# Patient Record
Sex: Female | Born: 1935 | Race: White | Hispanic: No | Marital: Single | Smoking: Never smoker
Health system: Southern US, Community
[De-identification: ages and names within clinical notes are randomized; demographics above are authoritative.]

## PROBLEM LIST (undated history)

## (undated) DIAGNOSIS — G459 Transient cerebral ischemic attack, unspecified: Secondary | ICD-10-CM

## (undated) DIAGNOSIS — J189 Pneumonia, unspecified organism: Secondary | ICD-10-CM

## (undated) DIAGNOSIS — J9610 Chronic respiratory failure, unspecified whether with hypoxia or hypercapnia: Secondary | ICD-10-CM

## (undated) DIAGNOSIS — K5790 Diverticulosis of intestine, part unspecified, without perforation or abscess without bleeding: Secondary | ICD-10-CM

## (undated) DIAGNOSIS — K219 Gastro-esophageal reflux disease without esophagitis: Secondary | ICD-10-CM

## (undated) DIAGNOSIS — S32599A Other specified fracture of unspecified pubis, initial encounter for closed fracture: Secondary | ICD-10-CM

## (undated) DIAGNOSIS — K449 Diaphragmatic hernia without obstruction or gangrene: Secondary | ICD-10-CM

## (undated) DIAGNOSIS — M810 Age-related osteoporosis without current pathological fracture: Secondary | ICD-10-CM

## (undated) DIAGNOSIS — K5900 Constipation, unspecified: Secondary | ICD-10-CM

## (undated) DIAGNOSIS — D126 Benign neoplasm of colon, unspecified: Secondary | ICD-10-CM

## (undated) DIAGNOSIS — E785 Hyperlipidemia, unspecified: Secondary | ICD-10-CM

## (undated) DIAGNOSIS — R0902 Hypoxemia: Secondary | ICD-10-CM

## (undated) DIAGNOSIS — G9340 Encephalopathy, unspecified: Secondary | ICD-10-CM

## (undated) DIAGNOSIS — I519 Heart disease, unspecified: Secondary | ICD-10-CM

## (undated) DIAGNOSIS — K589 Irritable bowel syndrome without diarrhea: Secondary | ICD-10-CM

## (undated) DIAGNOSIS — K222 Esophageal obstruction: Secondary | ICD-10-CM

## (undated) DIAGNOSIS — J471 Bronchiectasis with (acute) exacerbation: Secondary | ICD-10-CM

## (undated) DIAGNOSIS — D649 Anemia, unspecified: Secondary | ICD-10-CM

## (undated) DIAGNOSIS — E039 Hypothyroidism, unspecified: Secondary | ICD-10-CM

## (undated) DIAGNOSIS — R413 Other amnesia: Secondary | ICD-10-CM

## (undated) DIAGNOSIS — J329 Chronic sinusitis, unspecified: Secondary | ICD-10-CM

## (undated) DIAGNOSIS — I10 Essential (primary) hypertension: Secondary | ICD-10-CM

## (undated) DIAGNOSIS — F3289 Other specified depressive episodes: Secondary | ICD-10-CM

## (undated) DIAGNOSIS — F329 Major depressive disorder, single episode, unspecified: Secondary | ICD-10-CM

## (undated) HISTORY — DX: Benign neoplasm of colon, unspecified: D12.6

## (undated) HISTORY — DX: Constipation, unspecified: K59.00

## (undated) HISTORY — DX: Chronic respiratory failure, unspecified whether with hypoxia or hypercapnia: J96.10

## (undated) HISTORY — DX: Heart disease, unspecified: I51.9

## (undated) HISTORY — DX: Other specified depressive episodes: F32.89

## (undated) HISTORY — DX: Other specified fracture of unspecified pubis, initial encounter for closed fracture: S32.599A

## (undated) HISTORY — DX: Transient cerebral ischemic attack, unspecified: G45.9

## (undated) HISTORY — DX: Age-related osteoporosis without current pathological fracture: M81.0

## (undated) HISTORY — PX: FOOT SURGERY: SHX648

## (undated) HISTORY — DX: Hypothyroidism, unspecified: E03.9

## (undated) HISTORY — DX: Bronchiectasis with (acute) exacerbation: J47.1

## (undated) HISTORY — DX: Irritable bowel syndrome, unspecified: K58.9

## (undated) HISTORY — DX: Chronic sinusitis, unspecified: J32.9

## (undated) HISTORY — DX: Hyperlipidemia, unspecified: E78.5

## (undated) HISTORY — DX: Encephalopathy, unspecified: G93.40

## (undated) HISTORY — DX: Essential (primary) hypertension: I10

## (undated) HISTORY — DX: Hypoxemia: R09.02

## (undated) HISTORY — PX: NASAL SINUS SURGERY: SHX719

## (undated) HISTORY — DX: Diverticulosis of intestine, part unspecified, without perforation or abscess without bleeding: K57.90

## (undated) HISTORY — DX: Other amnesia: R41.3

## (undated) HISTORY — DX: Gastro-esophageal reflux disease without esophagitis: K21.9

## (undated) HISTORY — DX: Diaphragmatic hernia without obstruction or gangrene: K44.9

## (undated) HISTORY — DX: Pneumonia, unspecified organism: J18.9

## (undated) HISTORY — DX: Major depressive disorder, single episode, unspecified: F32.9

## (undated) HISTORY — DX: Esophageal obstruction: K22.2

## (undated) HISTORY — DX: Anemia, unspecified: D64.9

## (undated) HISTORY — PX: TONSILLECTOMY: SUR1361

---

## 1993-09-26 HISTORY — PX: CHOLECYSTECTOMY: SHX55

## 1998-04-08 ENCOUNTER — Other Ambulatory Visit: Admission: RE | Admit: 1998-04-08 | Discharge: 1998-04-08 | Payer: Self-pay | Admitting: Internal Medicine

## 1998-05-19 ENCOUNTER — Other Ambulatory Visit: Admission: RE | Admit: 1998-05-19 | Discharge: 1998-05-19 | Payer: Self-pay | Admitting: Obstetrics and Gynecology

## 1998-08-31 ENCOUNTER — Ambulatory Visit (HOSPITAL_COMMUNITY): Admission: RE | Admit: 1998-08-31 | Discharge: 1998-08-31 | Payer: Self-pay | Admitting: Obstetrics & Gynecology

## 1998-09-26 ENCOUNTER — Inpatient Hospital Stay (HOSPITAL_COMMUNITY): Admission: EM | Admit: 1998-09-26 | Discharge: 1998-10-01 | Payer: Self-pay | Admitting: Emergency Medicine

## 1998-09-26 ENCOUNTER — Encounter: Payer: Self-pay | Admitting: Emergency Medicine

## 1999-07-12 ENCOUNTER — Other Ambulatory Visit: Admission: RE | Admit: 1999-07-12 | Discharge: 1999-07-12 | Payer: Self-pay | Admitting: Obstetrics and Gynecology

## 2000-06-29 ENCOUNTER — Other Ambulatory Visit: Admission: RE | Admit: 2000-06-29 | Discharge: 2000-06-29 | Payer: Self-pay | Admitting: Obstetrics and Gynecology

## 2001-07-02 ENCOUNTER — Other Ambulatory Visit: Admission: RE | Admit: 2001-07-02 | Discharge: 2001-07-02 | Payer: Self-pay | Admitting: Obstetrics and Gynecology

## 2001-09-10 ENCOUNTER — Other Ambulatory Visit: Admission: RE | Admit: 2001-09-10 | Discharge: 2001-09-10 | Payer: Self-pay | Admitting: Obstetrics and Gynecology

## 2001-10-16 ENCOUNTER — Encounter: Payer: Self-pay | Admitting: Gastroenterology

## 2002-01-31 ENCOUNTER — Inpatient Hospital Stay (HOSPITAL_COMMUNITY): Admission: EM | Admit: 2002-01-31 | Discharge: 2002-02-06 | Payer: Self-pay | Admitting: Internal Medicine

## 2002-04-22 ENCOUNTER — Inpatient Hospital Stay (HOSPITAL_COMMUNITY): Admission: EM | Admit: 2002-04-22 | Discharge: 2002-04-25 | Payer: Self-pay | Admitting: Internal Medicine

## 2002-08-13 ENCOUNTER — Other Ambulatory Visit: Admission: RE | Admit: 2002-08-13 | Discharge: 2002-08-13 | Payer: Self-pay | Admitting: Obstetrics and Gynecology

## 2002-12-22 ENCOUNTER — Inpatient Hospital Stay (HOSPITAL_COMMUNITY): Admission: EM | Admit: 2002-12-22 | Discharge: 2002-12-26 | Payer: Self-pay | Admitting: Emergency Medicine

## 2002-12-22 ENCOUNTER — Encounter: Payer: Self-pay | Admitting: *Deleted

## 2002-12-23 ENCOUNTER — Encounter: Payer: Self-pay | Admitting: Cardiology

## 2003-08-29 ENCOUNTER — Encounter: Payer: Self-pay | Admitting: Gastroenterology

## 2003-12-02 ENCOUNTER — Encounter: Admission: RE | Admit: 2003-12-02 | Discharge: 2003-12-02 | Payer: Self-pay | Admitting: Otolaryngology

## 2003-12-04 ENCOUNTER — Encounter (INDEPENDENT_AMBULATORY_CARE_PROVIDER_SITE_OTHER): Payer: Self-pay | Admitting: *Deleted

## 2003-12-04 ENCOUNTER — Ambulatory Visit (HOSPITAL_BASED_OUTPATIENT_CLINIC_OR_DEPARTMENT_OTHER): Admission: RE | Admit: 2003-12-04 | Discharge: 2003-12-04 | Payer: Self-pay | Admitting: Otolaryngology

## 2003-12-04 ENCOUNTER — Ambulatory Visit (HOSPITAL_COMMUNITY): Admission: RE | Admit: 2003-12-04 | Discharge: 2003-12-04 | Payer: Self-pay | Admitting: Otolaryngology

## 2004-08-27 ENCOUNTER — Ambulatory Visit: Payer: Self-pay | Admitting: Internal Medicine

## 2004-10-11 ENCOUNTER — Ambulatory Visit: Payer: Self-pay | Admitting: Internal Medicine

## 2004-11-22 ENCOUNTER — Ambulatory Visit: Payer: Self-pay | Admitting: Internal Medicine

## 2004-12-09 ENCOUNTER — Ambulatory Visit: Payer: Self-pay | Admitting: Internal Medicine

## 2005-01-20 ENCOUNTER — Ambulatory Visit: Payer: Self-pay | Admitting: Internal Medicine

## 2005-04-20 ENCOUNTER — Ambulatory Visit: Payer: Self-pay | Admitting: Internal Medicine

## 2005-05-18 ENCOUNTER — Ambulatory Visit: Payer: Self-pay | Admitting: Internal Medicine

## 2005-06-03 ENCOUNTER — Ambulatory Visit: Payer: Self-pay | Admitting: Pulmonary Disease

## 2005-06-09 ENCOUNTER — Ambulatory Visit: Payer: Self-pay | Admitting: Family Medicine

## 2005-06-17 ENCOUNTER — Ambulatory Visit: Payer: Self-pay | Admitting: Internal Medicine

## 2005-07-04 ENCOUNTER — Ambulatory Visit: Payer: Self-pay | Admitting: Pulmonary Disease

## 2005-07-29 ENCOUNTER — Ambulatory Visit: Payer: Self-pay | Admitting: Internal Medicine

## 2005-09-09 ENCOUNTER — Ambulatory Visit: Payer: Self-pay | Admitting: Internal Medicine

## 2005-10-25 ENCOUNTER — Ambulatory Visit: Payer: Self-pay | Admitting: Internal Medicine

## 2005-10-25 ENCOUNTER — Ambulatory Visit: Payer: Self-pay | Admitting: Gastroenterology

## 2005-12-02 ENCOUNTER — Ambulatory Visit: Payer: Self-pay | Admitting: Internal Medicine

## 2005-12-16 ENCOUNTER — Ambulatory Visit: Payer: Self-pay | Admitting: Internal Medicine

## 2006-02-16 ENCOUNTER — Ambulatory Visit: Payer: Self-pay | Admitting: Internal Medicine

## 2006-05-17 ENCOUNTER — Ambulatory Visit: Payer: Self-pay | Admitting: Internal Medicine

## 2006-07-14 ENCOUNTER — Ambulatory Visit: Payer: Self-pay | Admitting: Internal Medicine

## 2006-07-21 ENCOUNTER — Inpatient Hospital Stay (HOSPITAL_COMMUNITY): Admission: EM | Admit: 2006-07-21 | Discharge: 2006-07-27 | Payer: Self-pay | Admitting: Internal Medicine

## 2006-07-21 ENCOUNTER — Ambulatory Visit: Payer: Self-pay | Admitting: Internal Medicine

## 2006-08-04 ENCOUNTER — Ambulatory Visit: Payer: Self-pay | Admitting: Internal Medicine

## 2006-08-10 ENCOUNTER — Ambulatory Visit: Payer: Self-pay | Admitting: Emergency Medicine

## 2006-09-15 ENCOUNTER — Ambulatory Visit: Payer: Self-pay | Admitting: Internal Medicine

## 2006-11-08 ENCOUNTER — Ambulatory Visit: Payer: Self-pay | Admitting: Internal Medicine

## 2006-11-08 LAB — CONVERTED CEMR LAB: TSH: 0.5 microintl units/mL (ref 0.35–5.50)

## 2006-11-14 ENCOUNTER — Inpatient Hospital Stay (HOSPITAL_COMMUNITY): Admission: EM | Admit: 2006-11-14 | Discharge: 2006-11-19 | Payer: Self-pay | Admitting: Internal Medicine

## 2006-11-14 ENCOUNTER — Ambulatory Visit: Payer: Self-pay | Admitting: Internal Medicine

## 2006-11-27 ENCOUNTER — Ambulatory Visit: Payer: Self-pay | Admitting: Internal Medicine

## 2006-12-26 ENCOUNTER — Ambulatory Visit: Payer: Self-pay | Admitting: Internal Medicine

## 2006-12-26 LAB — CONVERTED CEMR LAB
AST: 34 units/L (ref 0–37)
Albumin: 3.4 g/dL — ABNORMAL LOW (ref 3.5–5.2)
Alkaline Phosphatase: 69 units/L (ref 39–117)
BUN: 12 mg/dL (ref 6–23)
Basophils Absolute: 0.1 10*3/uL (ref 0.0–0.1)
Chloride: 100 meq/L (ref 96–112)
Cholesterol: 223 mg/dL (ref 0–200)
Creatinine, Ser: 0.8 mg/dL (ref 0.4–1.2)
Direct LDL: 140.3 mg/dL
Eosinophils Absolute: 0.2 10*3/uL (ref 0.0–0.6)
GFR calc non Af Amer: 75 mL/min
HDL: 66.8 mg/dL (ref >39.0)
MCHC: 35.2 g/dL (ref 30.0–36.0)
MCV: 88.1 fL (ref 78.0–100.0)
Monocytes Relative: 12.3 % — ABNORMAL HIGH (ref 3.0–11.0)
Potassium: 3.6 meq/L (ref 3.5–5.1)
RBC: 4.5 M/uL (ref 3.87–5.11)
RDW: 13.7 % (ref 11.5–14.6)
TSH: 1.58 microintl units/mL (ref 0.35–5.50)
Total Bilirubin: 0.8 mg/dL (ref 0.3–1.2)
Total CHOL/HDL Ratio: 3.3
Triglycerides: 116 mg/dL (ref 0–149)

## 2007-01-08 ENCOUNTER — Ambulatory Visit: Payer: Self-pay | Admitting: Internal Medicine

## 2007-01-08 ENCOUNTER — Encounter: Payer: Self-pay | Admitting: Pulmonary Disease

## 2007-02-26 ENCOUNTER — Ambulatory Visit: Payer: Self-pay | Admitting: Internal Medicine

## 2007-02-26 LAB — CONVERTED CEMR LAB
Cholesterol: 191 mg/dL (ref 0–200)
HDL: 71.2 mg/dL (ref >39.0)
Triglycerides: 106 mg/dL (ref 0–149)
VLDL: 21 mg/dL (ref 0–40)

## 2007-04-09 ENCOUNTER — Ambulatory Visit: Payer: Self-pay | Admitting: Internal Medicine

## 2007-04-10 ENCOUNTER — Ambulatory Visit: Payer: Self-pay | Admitting: Pulmonary Disease

## 2007-07-04 ENCOUNTER — Ambulatory Visit: Payer: Self-pay | Admitting: Family Medicine

## 2007-07-10 ENCOUNTER — Ambulatory Visit: Payer: Self-pay | Admitting: Internal Medicine

## 2007-07-12 DIAGNOSIS — E785 Hyperlipidemia, unspecified: Secondary | ICD-10-CM | POA: Insufficient documentation

## 2007-07-12 DIAGNOSIS — M810 Age-related osteoporosis without current pathological fracture: Secondary | ICD-10-CM | POA: Insufficient documentation

## 2007-07-12 DIAGNOSIS — J40 Bronchitis, not specified as acute or chronic: Secondary | ICD-10-CM | POA: Insufficient documentation

## 2007-08-06 ENCOUNTER — Ambulatory Visit: Payer: Self-pay | Admitting: Internal Medicine

## 2007-08-13 ENCOUNTER — Ambulatory Visit: Payer: Self-pay | Admitting: Pulmonary Disease

## 2007-08-14 ENCOUNTER — Encounter: Payer: Self-pay | Admitting: Pulmonary Disease

## 2007-08-28 ENCOUNTER — Ambulatory Visit: Payer: Self-pay | Admitting: Internal Medicine

## 2007-08-29 ENCOUNTER — Ambulatory Visit: Payer: Self-pay | Admitting: Internal Medicine

## 2007-08-29 ENCOUNTER — Telehealth (INDEPENDENT_AMBULATORY_CARE_PROVIDER_SITE_OTHER): Payer: Self-pay | Admitting: *Deleted

## 2007-08-30 ENCOUNTER — Ambulatory Visit: Payer: Self-pay | Admitting: Pulmonary Disease

## 2007-08-30 LAB — CONVERTED CEMR LAB
AST: 26 units/L (ref 0–37)
Albumin: 3.4 g/dL — ABNORMAL LOW (ref 3.5–5.2)
Alkaline Phosphatase: 63 units/L (ref 39–117)
BUN: 7 mg/dL (ref 6–23)
CO2: 30 meq/L (ref 19–32)
Calcium: 8.9 mg/dL (ref 8.4–10.5)
GFR calc Af Amer: 91 mL/min
GFR calc non Af Amer: 75 mL/min
Magnesium: 2.2 mg/dL (ref 1.5–2.5)
Potassium: 3.8 meq/L (ref 3.5–5.1)
Total Bilirubin: 0.5 mg/dL (ref 0.3–1.2)
Total Protein: 6.9 g/dL (ref 6.0–8.3)

## 2007-09-14 ENCOUNTER — Ambulatory Visit: Payer: Self-pay | Admitting: Internal Medicine

## 2007-09-14 DIAGNOSIS — M8900 Algoneurodystrophy, unspecified site: Secondary | ICD-10-CM | POA: Insufficient documentation

## 2007-10-12 ENCOUNTER — Ambulatory Visit (HOSPITAL_COMMUNITY): Admission: RE | Admit: 2007-10-12 | Discharge: 2007-10-12 | Payer: Self-pay | Admitting: Pulmonary Disease

## 2007-10-12 DIAGNOSIS — J4489 Other specified chronic obstructive pulmonary disease: Secondary | ICD-10-CM | POA: Insufficient documentation

## 2007-10-12 DIAGNOSIS — J449 Chronic obstructive pulmonary disease, unspecified: Secondary | ICD-10-CM

## 2007-10-15 ENCOUNTER — Ambulatory Visit: Payer: Self-pay | Admitting: Internal Medicine

## 2007-10-15 DIAGNOSIS — J471 Bronchiectasis with (acute) exacerbation: Secondary | ICD-10-CM

## 2008-01-07 ENCOUNTER — Ambulatory Visit: Payer: Self-pay | Admitting: Internal Medicine

## 2008-01-07 DIAGNOSIS — E039 Hypothyroidism, unspecified: Secondary | ICD-10-CM | POA: Insufficient documentation

## 2008-01-08 LAB — CONVERTED CEMR LAB
ALT: 15 units/L (ref 0–35)
AST: 22 units/L (ref 0–37)
Basophils Absolute: 0.1 10*3/uL (ref 0.0–0.1)
Basophils Relative: 1.2 % — ABNORMAL HIGH (ref 0.0–1.0)
Bilirubin, Direct: 0.1 mg/dL (ref 0.0–0.3)
CO2: 33 meq/L — ABNORMAL HIGH (ref 19–32)
Calcium: 9.1 mg/dL (ref 8.4–10.5)
Chloride: 96 meq/L (ref 96–112)
Creatinine, Ser: 0.8 mg/dL (ref 0.4–1.2)
Glucose, Bld: 91 mg/dL (ref 70–99)
Hemoglobin: 15 g/dL (ref 12.0–15.0)
Lymphocytes Relative: 28.9 % (ref 12.0–46.0)
Monocytes Relative: 13.1 % — ABNORMAL HIGH (ref 3.0–12.0)
Neutro Abs: 4.3 10*3/uL (ref 1.4–7.7)
Neutrophils Relative %: 55.3 % (ref 43.0–77.0)
RBC: 5.09 M/uL (ref 3.87–5.11)
TSH: 11.3 microintl units/mL — ABNORMAL HIGH (ref 0.35–5.50)
Total Bilirubin: 0.9 mg/dL (ref 0.3–1.2)
Total Protein: 7.1 g/dL (ref 6.0–8.3)
Vit D, 1,25-Dihydroxy: 32 (ref 30–89)
WBC: 7.8 10*3/uL (ref 4.5–10.5)

## 2008-03-06 ENCOUNTER — Encounter: Payer: Self-pay | Admitting: Internal Medicine

## 2008-03-13 ENCOUNTER — Ambulatory Visit: Payer: Self-pay | Admitting: Internal Medicine

## 2008-03-14 DIAGNOSIS — K219 Gastro-esophageal reflux disease without esophagitis: Secondary | ICD-10-CM | POA: Insufficient documentation

## 2008-04-21 ENCOUNTER — Ambulatory Visit: Payer: Self-pay | Admitting: Internal Medicine

## 2008-04-30 ENCOUNTER — Encounter: Payer: Self-pay | Admitting: Internal Medicine

## 2008-05-27 HISTORY — PX: SMALL INTESTINE SURGERY: SHX150

## 2008-06-09 ENCOUNTER — Ambulatory Visit: Payer: Self-pay | Admitting: Internal Medicine

## 2008-06-12 ENCOUNTER — Inpatient Hospital Stay (HOSPITAL_COMMUNITY): Admission: EM | Admit: 2008-06-12 | Discharge: 2008-06-20 | Payer: Self-pay | Admitting: Emergency Medicine

## 2008-06-12 ENCOUNTER — Ambulatory Visit: Payer: Self-pay | Admitting: Internal Medicine

## 2008-06-13 ENCOUNTER — Encounter (INDEPENDENT_AMBULATORY_CARE_PROVIDER_SITE_OTHER): Payer: Self-pay | Admitting: *Deleted

## 2008-06-13 ENCOUNTER — Encounter: Payer: Self-pay | Admitting: Internal Medicine

## 2008-07-02 ENCOUNTER — Ambulatory Visit: Payer: Self-pay | Admitting: Internal Medicine

## 2008-07-02 ENCOUNTER — Inpatient Hospital Stay (HOSPITAL_COMMUNITY): Admission: EM | Admit: 2008-07-02 | Discharge: 2008-07-04 | Payer: Self-pay | Admitting: Emergency Medicine

## 2008-07-21 ENCOUNTER — Ambulatory Visit: Payer: Self-pay | Admitting: Internal Medicine

## 2008-07-21 DIAGNOSIS — Z8719 Personal history of other diseases of the digestive system: Secondary | ICD-10-CM

## 2008-08-01 ENCOUNTER — Encounter: Payer: Self-pay | Admitting: Internal Medicine

## 2008-08-11 ENCOUNTER — Ambulatory Visit: Payer: Self-pay | Admitting: Internal Medicine

## 2008-09-23 ENCOUNTER — Ambulatory Visit: Payer: Self-pay | Admitting: Internal Medicine

## 2008-10-01 ENCOUNTER — Telehealth: Payer: Self-pay | Admitting: Adult Health

## 2008-10-09 ENCOUNTER — Ambulatory Visit: Payer: Self-pay | Admitting: Adult Health

## 2008-10-09 LAB — CONVERTED CEMR LAB
BUN: 8 mg/dL (ref 6–23)
CO2: 29 meq/L (ref 19–32)
Calcium: 9.2 mg/dL (ref 8.4–10.5)
Chloride: 99 meq/L (ref 96–112)
Creatinine, Ser: 1 mg/dL (ref 0.4–1.2)

## 2008-10-24 ENCOUNTER — Ambulatory Visit (HOSPITAL_COMMUNITY): Admission: RE | Admit: 2008-10-24 | Discharge: 2008-10-24 | Payer: Self-pay | Admitting: Internal Medicine

## 2008-10-30 ENCOUNTER — Ambulatory Visit: Payer: Self-pay | Admitting: Internal Medicine

## 2008-10-30 DIAGNOSIS — J479 Bronchiectasis, uncomplicated: Secondary | ICD-10-CM

## 2008-11-05 ENCOUNTER — Telehealth (INDEPENDENT_AMBULATORY_CARE_PROVIDER_SITE_OTHER): Payer: Self-pay | Admitting: *Deleted

## 2008-12-04 ENCOUNTER — Ambulatory Visit: Payer: Self-pay | Admitting: Gastroenterology

## 2008-12-04 DIAGNOSIS — R143 Flatulence: Secondary | ICD-10-CM

## 2008-12-04 DIAGNOSIS — R141 Gas pain: Secondary | ICD-10-CM

## 2008-12-04 DIAGNOSIS — R142 Eructation: Secondary | ICD-10-CM

## 2008-12-04 DIAGNOSIS — K5730 Diverticulosis of large intestine without perforation or abscess without bleeding: Secondary | ICD-10-CM | POA: Insufficient documentation

## 2008-12-11 ENCOUNTER — Ambulatory Visit: Payer: Self-pay | Admitting: Internal Medicine

## 2008-12-24 ENCOUNTER — Ambulatory Visit: Payer: Self-pay | Admitting: Internal Medicine

## 2008-12-25 DIAGNOSIS — D126 Benign neoplasm of colon, unspecified: Secondary | ICD-10-CM

## 2008-12-25 HISTORY — DX: Benign neoplasm of colon, unspecified: D12.6

## 2009-01-01 ENCOUNTER — Ambulatory Visit: Payer: Self-pay | Admitting: Gastroenterology

## 2009-01-01 ENCOUNTER — Encounter: Payer: Self-pay | Admitting: Gastroenterology

## 2009-01-02 ENCOUNTER — Encounter: Payer: Self-pay | Admitting: Gastroenterology

## 2009-01-07 ENCOUNTER — Ambulatory Visit: Payer: Self-pay | Admitting: Internal Medicine

## 2009-01-07 DIAGNOSIS — G459 Transient cerebral ischemic attack, unspecified: Secondary | ICD-10-CM | POA: Insufficient documentation

## 2009-01-08 ENCOUNTER — Telehealth (INDEPENDENT_AMBULATORY_CARE_PROVIDER_SITE_OTHER): Payer: Self-pay | Admitting: *Deleted

## 2009-01-08 LAB — CONVERTED CEMR LAB: Vit D, 25-Hydroxy: 34 ng/mL (ref 30–89)

## 2009-01-09 LAB — CONVERTED CEMR LAB
AST: 19 units/L (ref 0–37)
Alkaline Phosphatase: 58 units/L (ref 39–117)
Basophils Relative: 0.9 % (ref 0.0–3.0)
Bilirubin, Direct: 0.1 mg/dL (ref 0.0–0.3)
Calcium: 9.5 mg/dL (ref 8.4–10.5)
Creatinine, Ser: 0.9 mg/dL (ref 0.4–1.2)
Eosinophils Absolute: 0.3 10*3/uL (ref 0.0–0.7)
Eosinophils Relative: 3.3 % (ref 0.0–5.0)
GFR calc non Af Amer: 65.16 mL/min (ref >60)
HDL: 62 mg/dL (ref >39.00)
Hemoglobin, Urine: NEGATIVE
Hemoglobin: 12.5 g/dL (ref 12.0–15.0)
Ketones, ur: NEGATIVE mg/dL
LDL Cholesterol: 88 mg/dL (ref 0–99)
Lymphocytes Relative: 23.5 % (ref 12.0–46.0)
Monocytes Relative: 14.8 % — ABNORMAL HIGH (ref 3.0–12.0)
Neutro Abs: 5 10*3/uL (ref 1.4–7.7)
Neutrophils Relative %: 57.5 % (ref 43.0–77.0)
RBC: 4.35 M/uL (ref 3.87–5.11)
Sodium: 142 meq/L (ref 135–145)
Total CHOL/HDL Ratio: 3
Total Protein, Urine: NEGATIVE mg/dL
Total Protein: 7 g/dL (ref 6.0–8.3)
Triglycerides: 65 mg/dL (ref 0.0–149.0)
Urine Glucose: NEGATIVE mg/dL
WBC: 8.8 10*3/uL (ref 4.5–10.5)

## 2009-01-26 ENCOUNTER — Encounter: Payer: Self-pay | Admitting: Internal Medicine

## 2009-03-13 ENCOUNTER — Encounter: Payer: Self-pay | Admitting: Internal Medicine

## 2009-03-17 ENCOUNTER — Ambulatory Visit: Payer: Self-pay | Admitting: Internal Medicine

## 2009-05-13 ENCOUNTER — Encounter: Payer: Self-pay | Admitting: Internal Medicine

## 2009-05-19 ENCOUNTER — Ambulatory Visit: Payer: Self-pay | Admitting: Internal Medicine

## 2009-06-05 ENCOUNTER — Telehealth (INDEPENDENT_AMBULATORY_CARE_PROVIDER_SITE_OTHER): Payer: Self-pay | Admitting: *Deleted

## 2009-06-30 ENCOUNTER — Encounter: Payer: Self-pay | Admitting: Internal Medicine

## 2009-06-30 ENCOUNTER — Encounter: Payer: Self-pay | Admitting: Adult Health

## 2009-07-07 ENCOUNTER — Encounter: Payer: Self-pay | Admitting: Internal Medicine

## 2009-07-22 ENCOUNTER — Ambulatory Visit: Payer: Self-pay | Admitting: Internal Medicine

## 2009-07-22 DIAGNOSIS — M2550 Pain in unspecified joint: Secondary | ICD-10-CM | POA: Insufficient documentation

## 2009-08-05 ENCOUNTER — Ambulatory Visit: Payer: Self-pay | Admitting: Internal Medicine

## 2009-08-05 ENCOUNTER — Encounter: Payer: Self-pay | Admitting: Internal Medicine

## 2009-08-24 ENCOUNTER — Ambulatory Visit: Payer: Self-pay | Admitting: Internal Medicine

## 2009-09-21 IMAGING — CR DG CHEST 2V
2 series · 2 of 2 positions shown · non-contrast
Comparison: 08/13/2007

CLINICAL DATA: Bronchiectasis, acute exacerbation

CHEST - 2 VIEW

[view not recorded (1 of 2)]
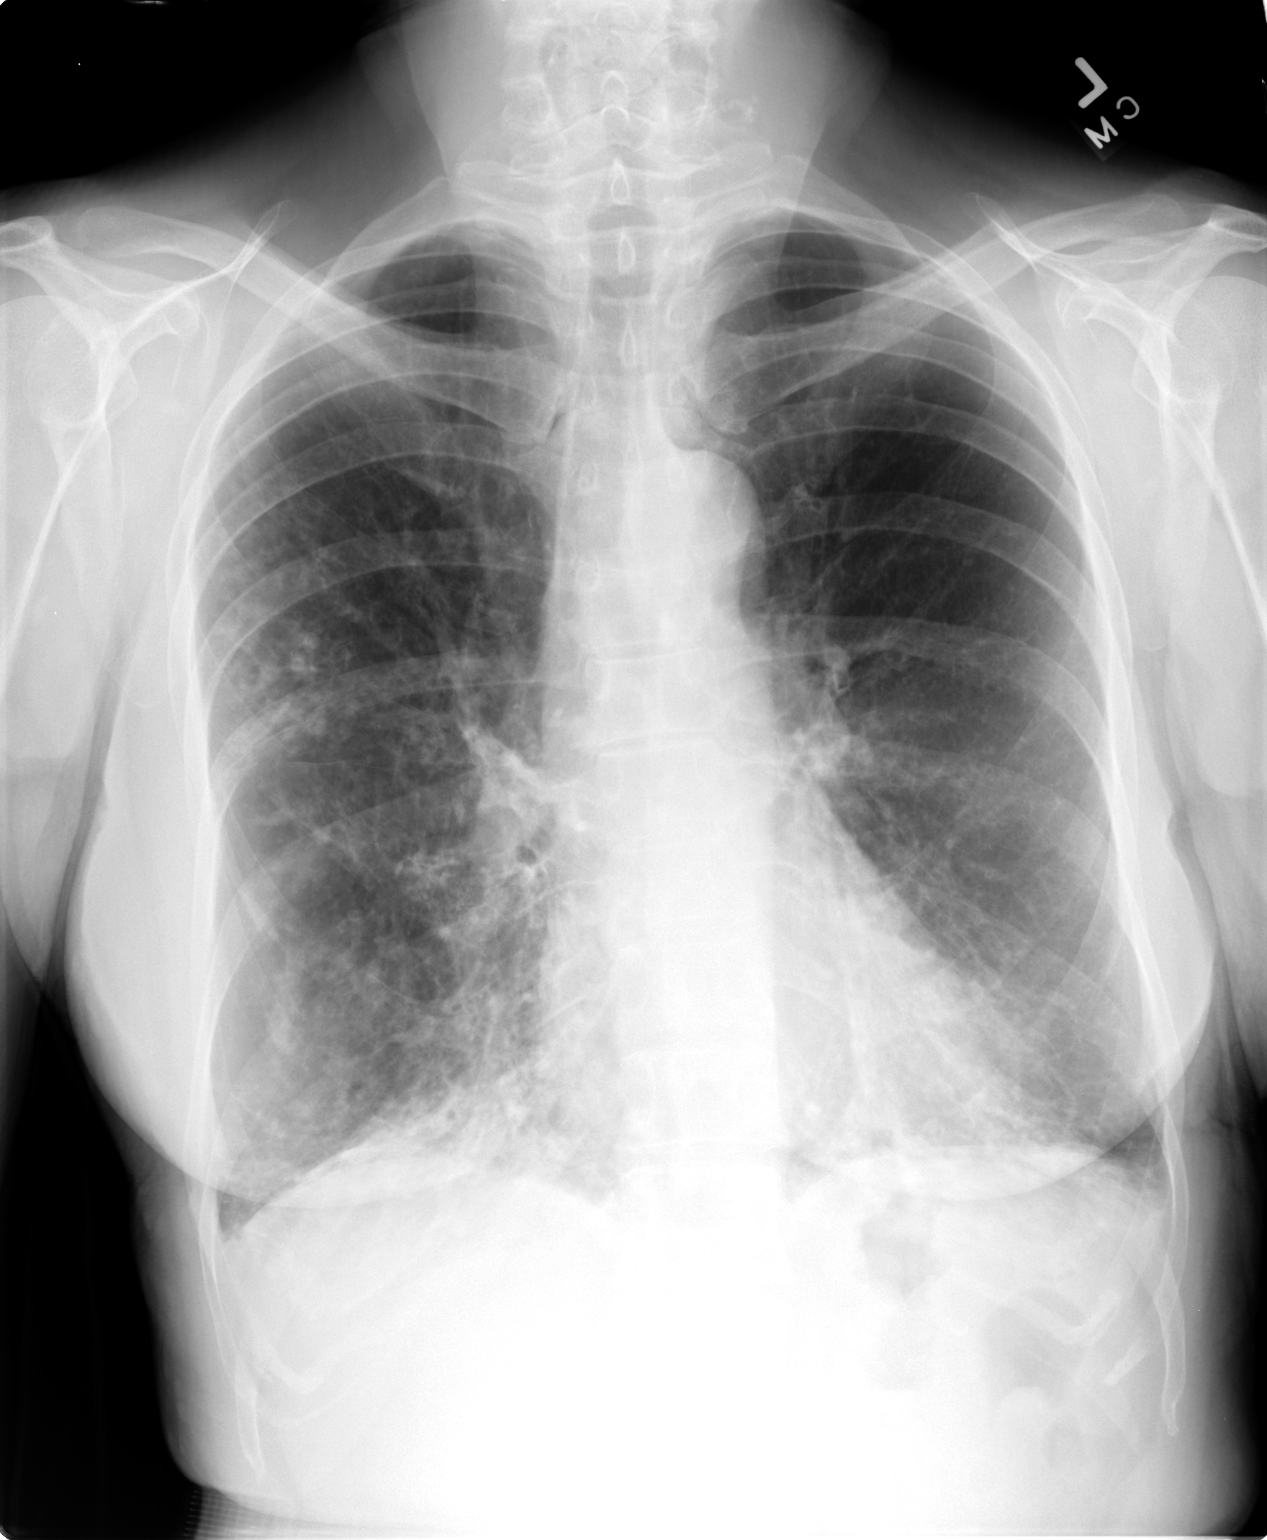

[view not recorded (2 of 2)]
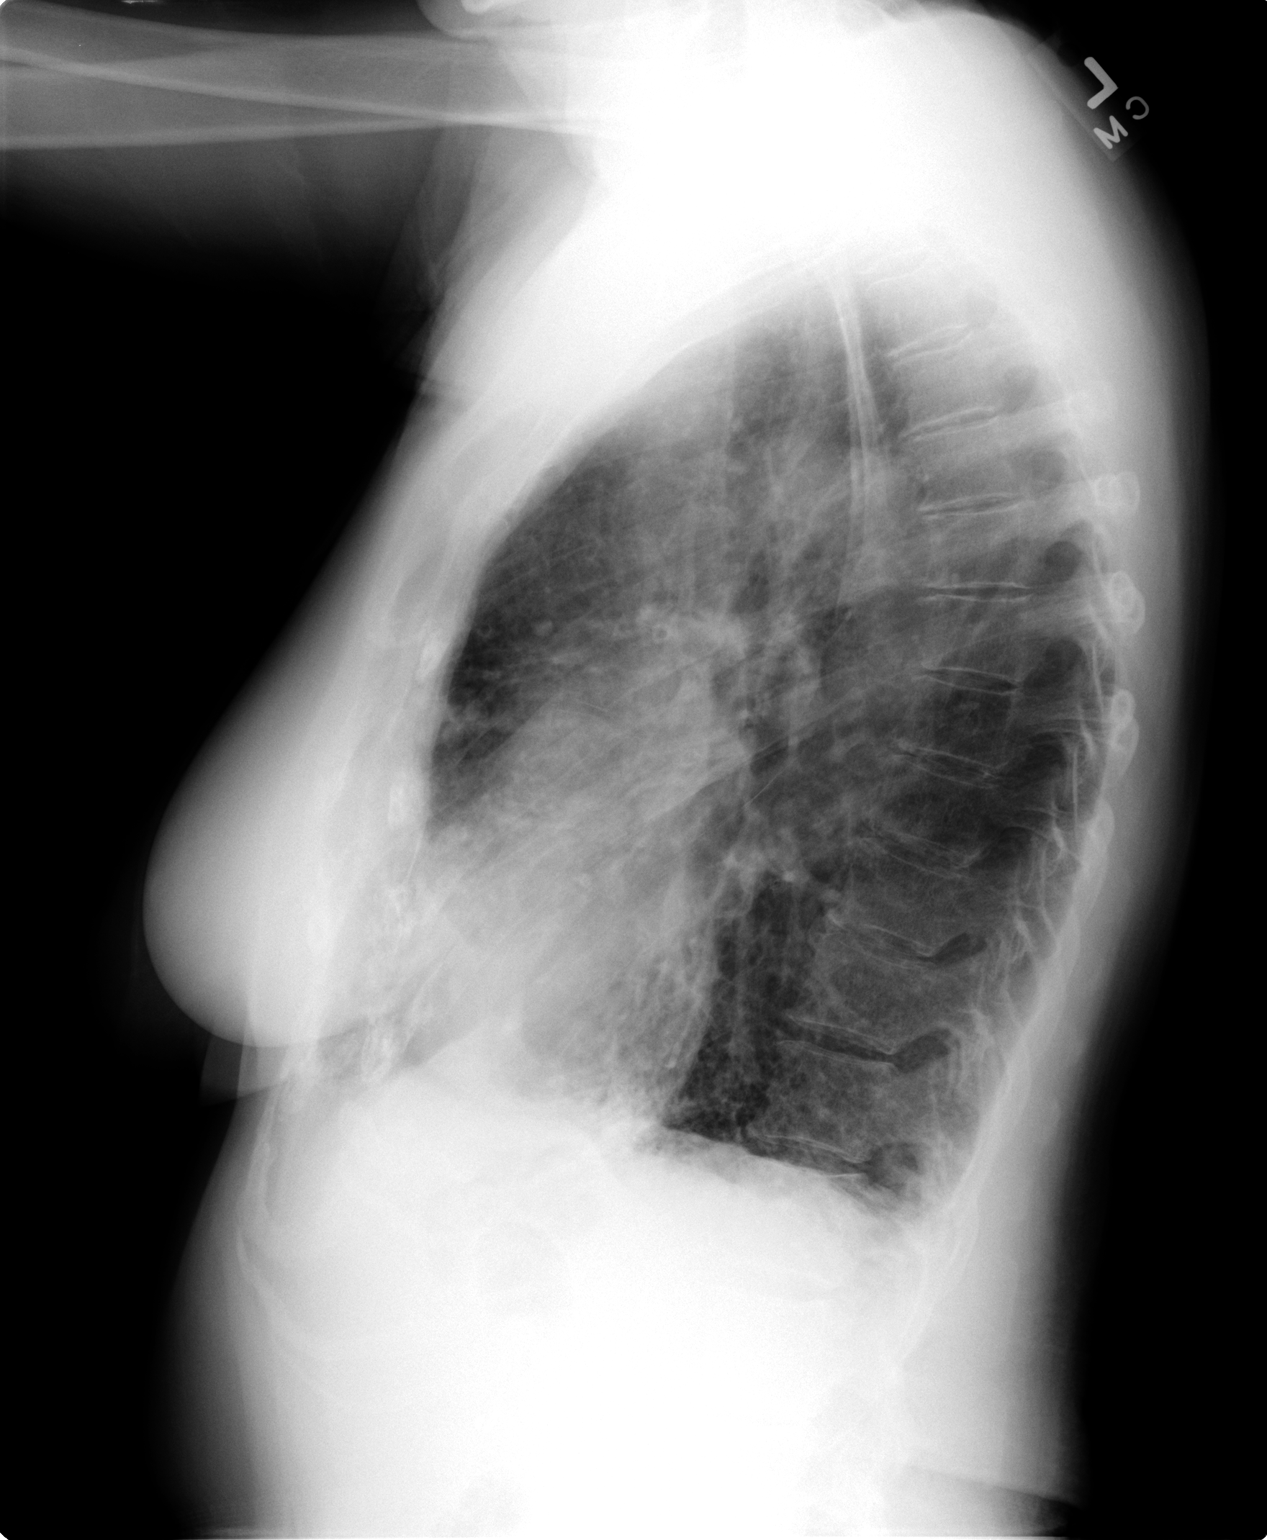

[2 of 2 positions shown; findings below may reference images not displayed]

FINDINGS: Scarring at the lung bases and nodularity in the
periphery the right mid lung appears stable.  No active process is
seen.  No effusion is noted.  The heart is within normal limits in
size.
IMPRESSION: Stable chronic change with basilar scarring and nodularity on the
right.  No active process.

## 2009-09-24 ENCOUNTER — Telehealth: Payer: Self-pay | Admitting: Internal Medicine

## 2009-09-24 ENCOUNTER — Encounter: Payer: Self-pay | Admitting: Internal Medicine

## 2009-10-13 ENCOUNTER — Ambulatory Visit: Payer: Self-pay | Admitting: Internal Medicine

## 2009-10-26 ENCOUNTER — Ambulatory Visit: Payer: Self-pay | Admitting: Internal Medicine

## 2009-10-26 ENCOUNTER — Encounter: Payer: Self-pay | Admitting: Adult Health

## 2009-10-26 DIAGNOSIS — R5383 Other fatigue: Secondary | ICD-10-CM

## 2009-10-26 DIAGNOSIS — R5381 Other malaise: Secondary | ICD-10-CM

## 2009-10-26 LAB — CONVERTED CEMR LAB
Basophils Relative: 0.2 % (ref 0.0–3.0)
CO2: 31 meq/L (ref 19–32)
Chloride: 98 meq/L (ref 96–112)
Creatinine, Ser: 0.9 mg/dL (ref 0.4–1.2)
Eosinophils Absolute: 0.1 10*3/uL (ref 0.0–0.7)
Glucose, Bld: 89 mg/dL (ref 70–99)
Hemoglobin: 12.4 g/dL (ref 12.0–15.0)
MCHC: 33.1 g/dL (ref 30.0–36.0)
MCV: 89.9 fL (ref 78.0–100.0)
Monocytes Absolute: 1.2 10*3/uL — ABNORMAL HIGH (ref 0.1–1.0)
Neutro Abs: 18.6 10*3/uL — ABNORMAL HIGH (ref 1.4–7.7)
RBC: 4.17 M/uL (ref 3.87–5.11)
Saturation Ratios: 15.9 % — ABNORMAL LOW (ref 20.0–50.0)
Sodium: 135 meq/L (ref 135–145)
Specific Gravity, Urine: <=1.005 (ref 1.000–1.030)
pH: 7 (ref 5.0–8.0)

## 2009-11-05 ENCOUNTER — Ambulatory Visit (HOSPITAL_COMMUNITY): Admission: RE | Admit: 2009-11-05 | Discharge: 2009-11-05 | Payer: Self-pay | Admitting: Internal Medicine

## 2009-11-19 ENCOUNTER — Ambulatory Visit: Payer: Self-pay | Admitting: Internal Medicine

## 2010-01-11 ENCOUNTER — Ambulatory Visit: Payer: Self-pay | Admitting: Internal Medicine

## 2010-01-12 LAB — CONVERTED CEMR LAB
Alkaline Phosphatase: 41 units/L (ref 39–117)
Basophils Absolute: 0.1 10*3/uL (ref 0.0–0.1)
Bilirubin, Direct: 0.1 mg/dL (ref 0.0–0.3)
Calcium: 8.9 mg/dL (ref 8.4–10.5)
Cholesterol: 172 mg/dL (ref 0–200)
Creatinine, Ser: 0.9 mg/dL (ref 0.4–1.2)
Eosinophils Absolute: 0.2 10*3/uL (ref 0.0–0.7)
HCT: 40.2 % (ref 36.0–46.0)
HDL: 77.4 mg/dL (ref >39.00)
Hemoglobin: 13.8 g/dL (ref 12.0–15.0)
LDL Cholesterol: 78 mg/dL (ref 0–99)
Lymphocytes Relative: 27.3 % (ref 12.0–46.0)
Lymphs Abs: 2.5 10*3/uL (ref 0.7–4.0)
MCHC: 34.3 g/dL (ref 30.0–36.0)
Monocytes Relative: 12.1 % — ABNORMAL HIGH (ref 3.0–12.0)
Neutro Abs: 5.3 10*3/uL (ref 1.4–7.7)
Nitrite: NEGATIVE
Platelets: 400 10*3/uL (ref 150.0–400.0)
RDW: 14.6 % (ref 11.5–14.6)
Sodium: 140 meq/L (ref 135–145)
Specific Gravity, Urine: 1.01 (ref 1.000–1.030)
Total Bilirubin: 0.4 mg/dL (ref 0.3–1.2)
Total CHOL/HDL Ratio: 2
Total Protein, Urine: NEGATIVE mg/dL
Triglycerides: 82 mg/dL (ref 0.0–149.0)
Urine Glucose: NEGATIVE mg/dL
Vit D, 25-Hydroxy: 35 ng/mL (ref 30–89)
pH: 6.5 (ref 5.0–8.0)

## 2010-02-17 ENCOUNTER — Encounter: Payer: Self-pay | Admitting: Internal Medicine

## 2010-02-26 IMAGING — CT CT ABDOMEN W/O CM
1 of 2 series · 15 of 32 positions shown, 19 images · non-contrast
Comparison: None

CT ABDOMEN

CLINICAL DATA: Abdominal pain

CT ABDOMEN AND PELVIS WITHOUT CONTRAST
TECHNIQUE: Multidetector CT imaging of the abdomen and pelvis was
performed following the standard
protocol without intravenous contrast.

[Series 2: abd_pel 5.0 b40f st · axial · 0.66mm/px · z∈[-467,-32]mm · 15 of 95 slices shown, 19 images]
[im 4/95  soft-tissue]
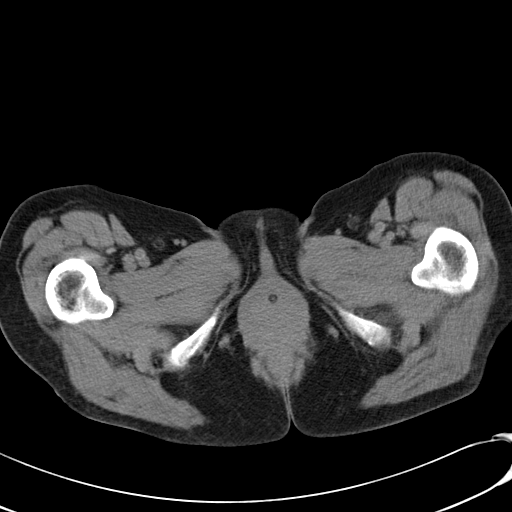
[im 4/95  bone]
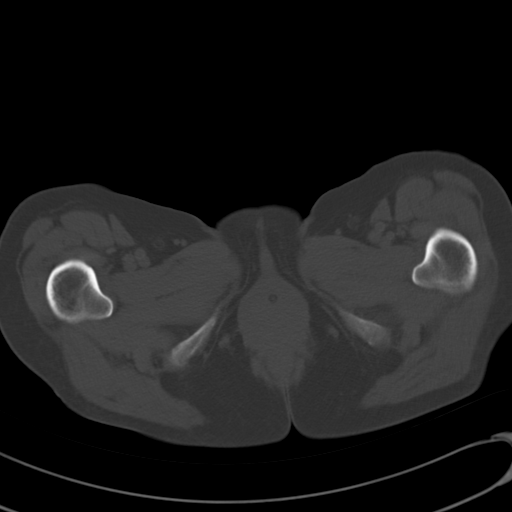
[im 11/95  soft-tissue]
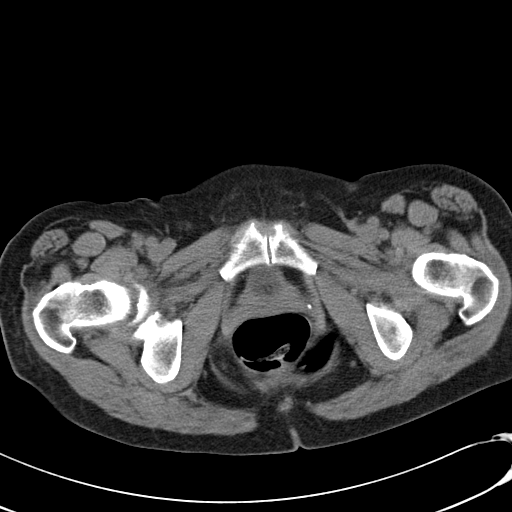
[im 19/95  soft-tissue]
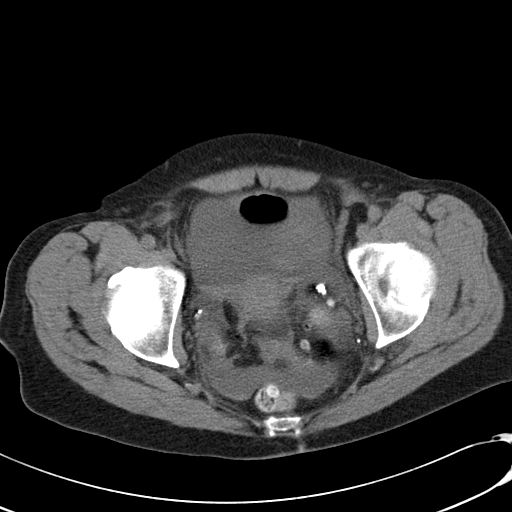
[im 26/95  soft-tissue]
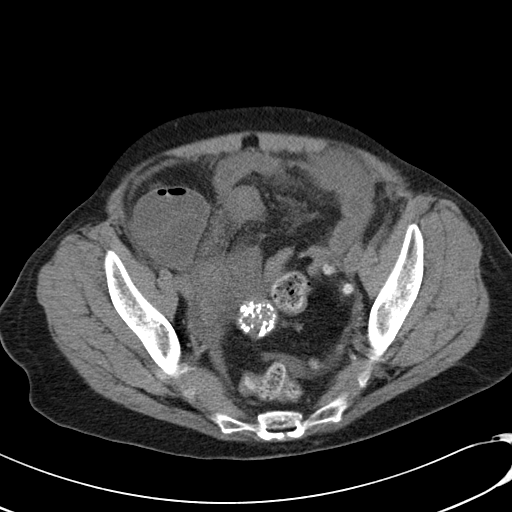
[im 33/95  soft-tissue]
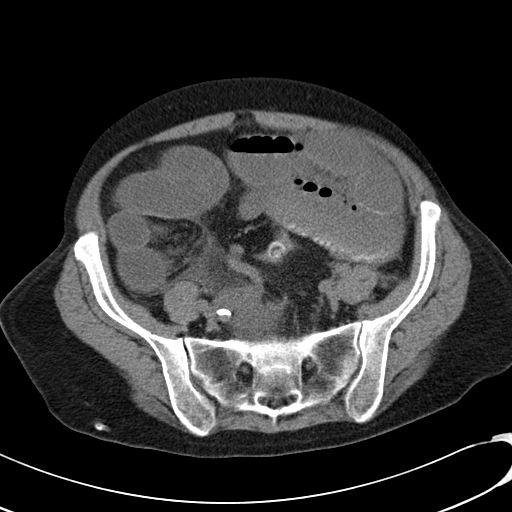
[im 40/95  soft-tissue]
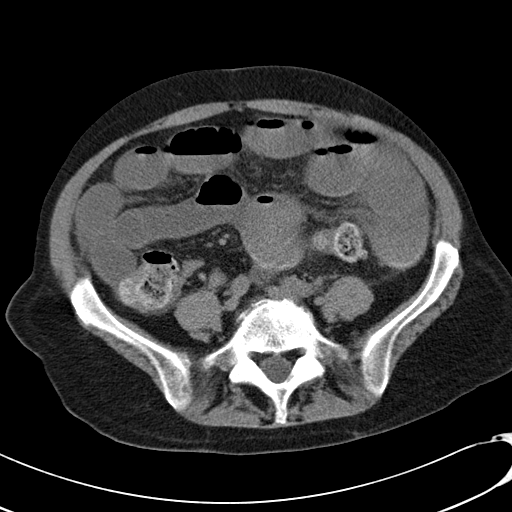
[im 48/95  soft-tissue]
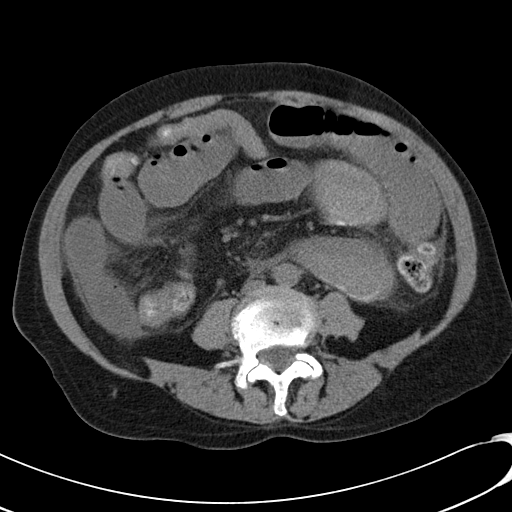
[im 55/95  soft-tissue]
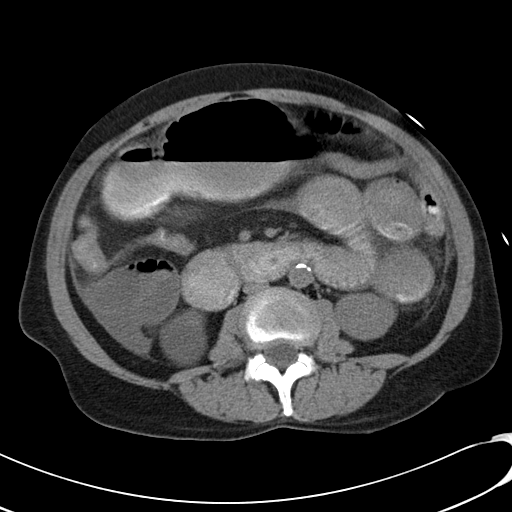
[im 62/95  soft-tissue]
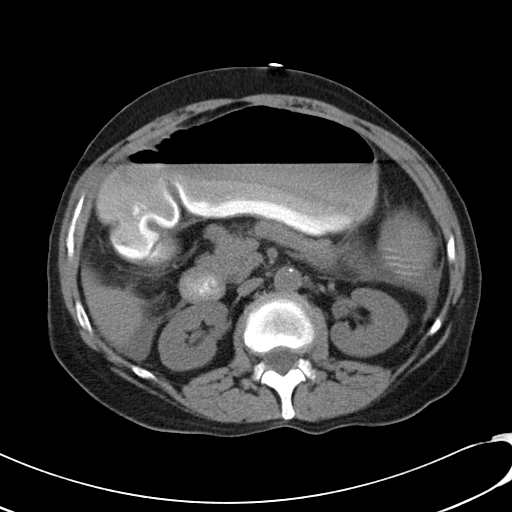
[im 62/95  bone]
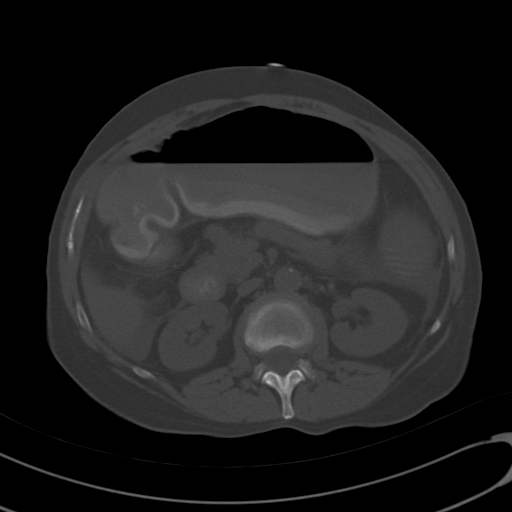
[im 69/95  soft-tissue]
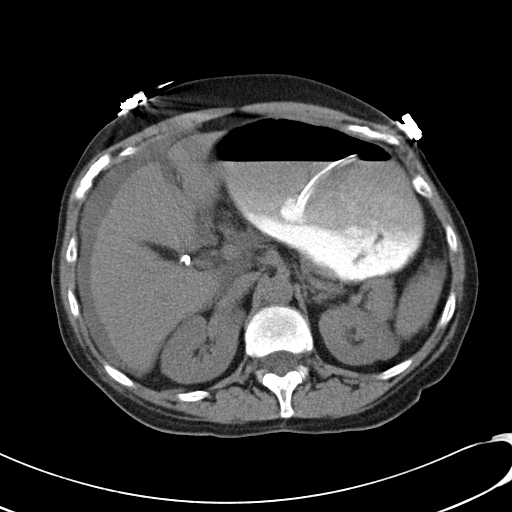
[im 76/95  soft-tissue]
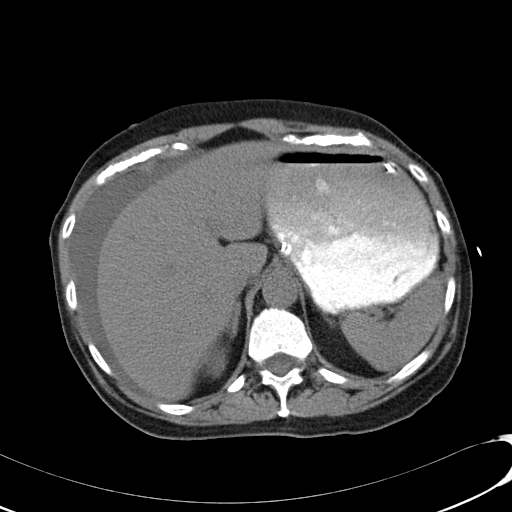
[im 80/95  lung]
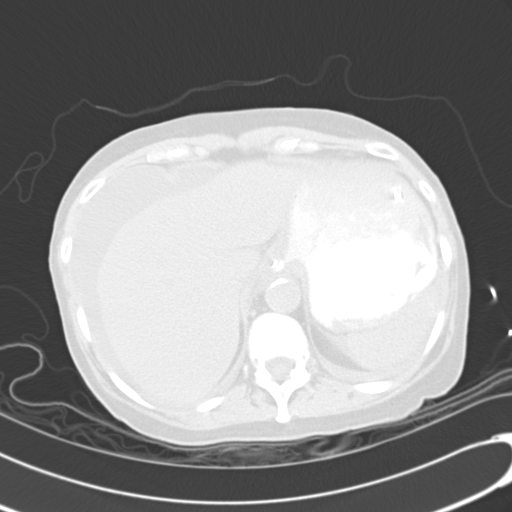
[im 84/95  soft-tissue]
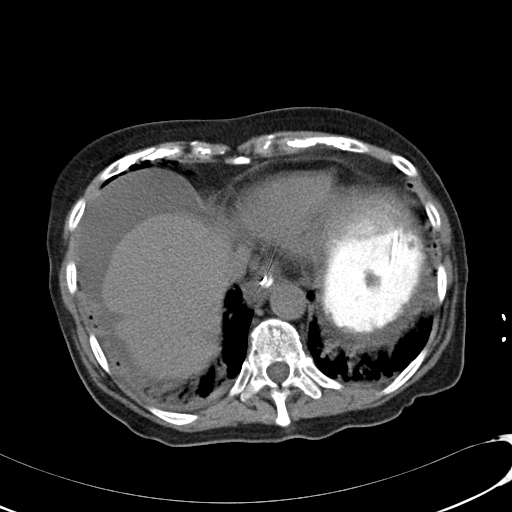
[im 84/95  lung]
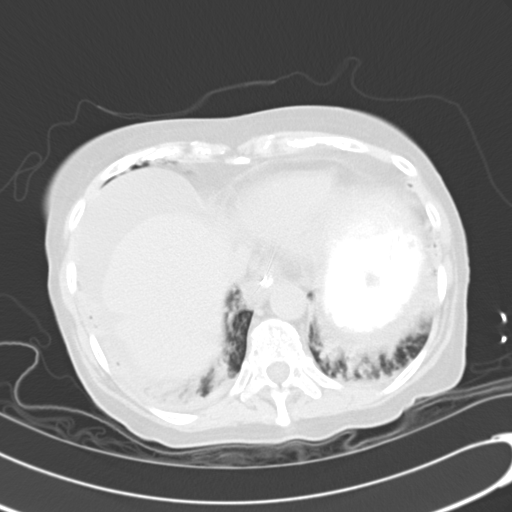
[im 87/95  lung]
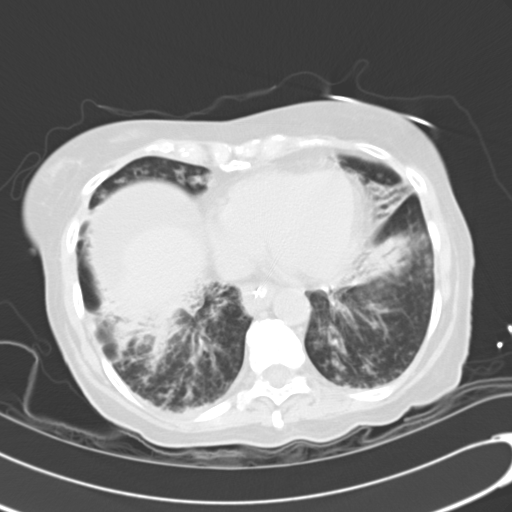
[im 91/95  soft-tissue]
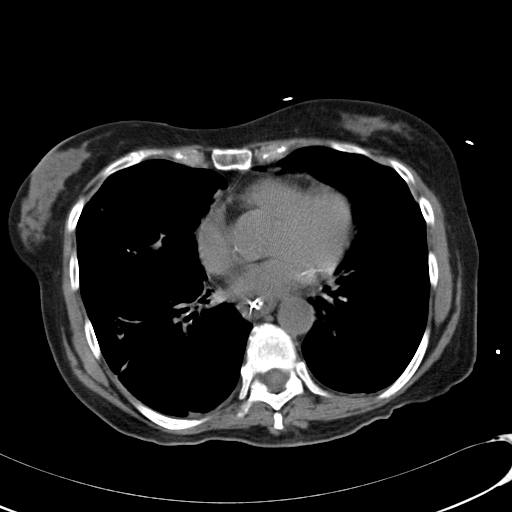
[im 91/95  lung]
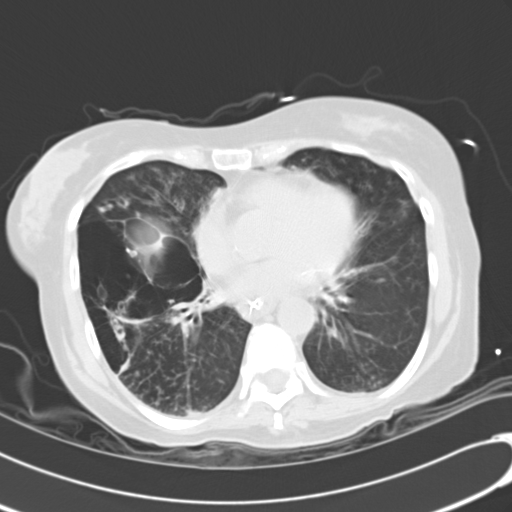

[15 of 32 positions shown; findings below may reference images not displayed]

FINDINGS: Diffuse bibasilar bronchiectatic changes with right
lower lobe bullae noted.  Nasogastric tube terminates in the
stomach.  There is diffuse gastric distension with marked proximal
and mid small bowel dilatation to the level of the right lower
quadrant, where there is distal decompression of the terminal
ileum.  Transition point in the right lower quadrant is not well
seen due to adjacent ascites and lack of contrast but is located
approximately the level of image 63.  The appendix is normal.  The
colon is decompressed and unremarkable.

Unenhanced abdominal viscera are otherwise unremarkable with the
exception of a 3.1 cm right lower renal pole cyst..
IMPRESSION: High-grade distal small bowel obstruction with transition point at
the level of the distal ileum in the right lower quadrant.

Bilateral chronic lower lobe pulmonary disease.

Ascites.

CT PELVIS
FINDINGS: Uterus has a lobulated contour with peripheral calcified
mass suggestive of fibroid.  The bladder is decompressed with a
Foley catheter in place.  With in the pelvic free fluid is
relatively layering dependent hyperdensity.  No free air.  No acute
bony finding.  Multilevel disc degenerative change identified.
IMPRESSION: Free pelvic fluid with apparent layering hyperdensity which may be
due to blood product/hematocrit level versus perforation with oral
contrast material. Findings called to Dr. Tera by Dr. Hobdari on
12/12/2007 at [DATE] a.m..

## 2010-04-12 ENCOUNTER — Ambulatory Visit: Payer: Self-pay | Admitting: Internal Medicine

## 2010-04-13 LAB — CONVERTED CEMR LAB
Basophils Absolute: 0.1 10*3/uL (ref 0.0–0.1)
Basophils Relative: 0.6 % (ref 0.0–3.0)
Eosinophils Relative: 0.5 % (ref 0.0–5.0)
Hemoglobin: 12.7 g/dL (ref 12.0–15.0)
Lymphocytes Relative: 9.8 % — ABNORMAL LOW (ref 12.0–46.0)
Monocytes Relative: 5.7 % (ref 3.0–12.0)
Neutro Abs: 11.5 10*3/uL — ABNORMAL HIGH (ref 1.4–7.7)
RBC: 4.32 M/uL (ref 3.87–5.11)
RDW: 14.5 % (ref 11.5–14.6)
WBC: 13.8 10*3/uL — ABNORMAL HIGH (ref 4.5–10.5)

## 2010-05-24 ENCOUNTER — Encounter: Payer: Self-pay | Admitting: Internal Medicine

## 2010-05-26 ENCOUNTER — Ambulatory Visit: Payer: Self-pay | Admitting: Internal Medicine

## 2010-07-29 ENCOUNTER — Ambulatory Visit: Payer: Self-pay | Admitting: Internal Medicine

## 2010-08-03 ENCOUNTER — Telehealth: Payer: Self-pay | Admitting: Internal Medicine

## 2010-08-24 ENCOUNTER — Ambulatory Visit: Payer: Self-pay | Admitting: Internal Medicine

## 2010-09-28 ENCOUNTER — Telehealth: Payer: Self-pay | Admitting: Adult Health

## 2010-10-26 NOTE — Progress Notes (Signed)
Summary: rx refill  Phone Note Call from Patient Call back at Home Phone [phone]   Caller: Patient Call For: wert Summary of Call: [facility] [address] does not yet have an order for refill of NISOLDIPINE (although it was approved yesterday). pt just spoke to the pharmacy.  Initial call taken by: Tivis Ringer, CNA,  August 03, 2010 9:11 AM  Follow-up for Phone Call        we sent refill electronically but it was not received,so I called and gave verbal to pharmacy. pt aware.Carron Curie CMA  August 03, 2010 9:54 AM

## 2010-10-26 NOTE — Assessment & Plan Note (Signed)
Summary: flu shot ///kp   Nurse Visit   Allergies: 1)  ! Iodine 2)  Sulfamethoxazole (Sulfamethoxazole) 3)  Erythromycin Ethylsuccinate  Orders Added: 1)  Flu Vaccine 52yrs + MEDICARE PATIENTS [Q2039] 2)  Administration Flu vaccine - MCR [G0008] Flu Vaccine Consent Questions     Do you have a history of severe allergic reactions to this vaccine? no    Any prior history of allergic reactions to egg and/or gelatin? no    Do you have a sensitivity to the preservative Thimersol? no    Do you have a past history of Guillan-Barre Syndrome? no    Do you currently have an acute febrile illness? no    Have you ever had a severe reaction to latex? no    Vaccine information given and explained to patient? yes    Are you currently pregnant? no    Lot Number:[id number]   Exp Date:03/26/2011   Site Given  Left Deltoid [id number]   [name]  July 29, 2010 4:16 PM

## 2010-10-26 NOTE — Letter (Signed)
Summary: [facility]  [facility]   Imported By: Lester Marengo 02/19/2010 07:50:37  _____________________________________________________________________  External Attachment:    Type:   Image     Comment:   External Document

## 2010-10-26 NOTE — Assessment & Plan Note (Signed)
Summary: NP follow up - reclast   Primary Provider/Referring Provider:  [name], MD  CC:  pt here today to discuss yearly reclast infusion, but states is only slightly improved from last OV: still haveing prod cough with yellow mucus, and some SOB and wheezing.  on regualr dose of prednisone.Marland Kitchen  History of Present Illness: 77  yowf  never smoker with documented chronic sinusitis /bronchiectasis and a tendency to recurrent purulent exacerbations,  has  been self titrating prednisone with a ceiling of 40 mg per day and a floor of 5.     May 19, 2009 worse sob finished cipro one day prior to ov minimal improvement discolored sputum.   July 22, 2009 ov 6 wk followup.  Pt c/o arthritis pain "all over".  Especially bothers her back and her knees.  She has been seeing [name] for this problem.  can't take advil due to gi intolerance.  --Mobic started.   August 24, 2009 --Presents for follow up and med reivew. Continues to have trouble with back now w/ radiculopathy down right leg, currently seeing ortho , [name], PT, and now chiropractor.  Changed mobic to as needed b/c did not seem to help, takes if pain gets to bad.  Meds are correct w/ list.  Breathing is at baseline. Recent BMD w/ no sign change, due for BMD  infusion after 10/24/09.  Denies chest pain, dyspnea, orthopnea, hemoptysis, fever, n/v/d, edema, headache.   October 13, 2009--Presents for an acute office visit. Complains of prod cough with light green mucus, increased SOB, wheezing for 1 week, worse 3 days. Cough is keeping her up at night. Feels worn out. Has decreased appetitie. Has nasal congestion, stuffy, weakness- no energy. Denies chest pain, orthopnea, hemoptysis, fever, n/v/d, edema.   October 26, 2009--Presents for follow up and discuss Reclast. She has osteopenia w/ last BMD 11/10 w/ Tscore of -2.2. She is intolerant to Bisphosphnates, and has received Reclast x 2 infusions with no known difficulties. She is  high risk for fx d/t chronic steroids. . She was seen 2 weeks w/ AEAB flare tx w/ Avelox and steroid burst. Hyzaar was held for 2 days d/t soft b/p. She is feeling better but still weak. She is tired alot and wants blood checked today. Last labs reviewed from 4/10 were unrevealing.   Denies chest pain,  orthopnea, hemoptysis, fever, n/v/d, edema, headache.   Medications Prior to Update: 1)  Multivitamins   Caps (Multiple Vitamin) .... Take 1 Tablet By Mouth Once A Day 2)  Vivelle-Dot 0.025 Mg/24hr  Pttw (Estradiol) .... Use On Tuesday and Saturday 3)  Synthroid 125 Mcg Tabs (Levothyroxine Sodium) .... Take 1 Tablet By Mouth Once A Day 4)  Citrucel  Powd (Methylcellulose (Laxative)) .Marland Kitchen.. 1 Tsp By Mouth Once Daily 5)  Nasonex 50 Mcg/act Susp (Mometasone Furoate) .Marland Kitchen.. 1-2 Puffs Two Times A Day 6)  Xalatan 0.005 %  Soln (Latanoprost) .... Use 1 Drop in Both Eyes At Bedtime 7)  Qvar 80 Mcg/act Aers (Beclomethasone Dipropionate) .... Inhale 2 Puffs Two Times A Day 8)  Plavix 75 Mg  Tabs (Clopidogrel Bisulfate) .... Take 1 Tablet By Mouth Once A Day 9)  Prednisone 10 Mg  Tabs (Prednisone) .... Take 1/2 Tablet Once Daily 10)  Klor-Con 20 Meq  Pack (Potassium Chloride) .... Use Two Times A Day 11)  Zyrtec Allergy 10 Mg  Tabs (Cetirizine Hcl) .... Take 1 Tablet By Mouth Once A Day 12)  Bisoprolol Fumarate 5 Mg  Tabs (  Bisoprolol Fumarate) .... 1/2 Once Daily 13)  Nisoldipine 20 Mg Xr24h-Tab (Nisoldipine) .... Take 1 Tablet By Mouth Once A Day 14)  Hyzaar 100-25 Mg  Tabs (Losartan Potassium-Hctz) .... Take 1 Tablet By Mouth Once A Day 15)  Prometrium 200 Mg Caps (Progesterone Micronized) .... Take 1 Capsule By Mouth At Bedtime 16)  Prevacid 15 Mg  Cpdr (Lansoprazole) .... Two Times A Day 17)  Calcium 500/d 500-400 Mg-Unit  Chew (Calcium-Vitamin D) .... Two Times A Day 18)  Famotidine 20 Mg Tabs (Famotidine) .... Take 1 Tab By Mouth At Bedtime 19)  Prednisone 10 Mg Tabs (Prednisone) .... 4 Tabs Till Better,  Then 3 Tabs X3days, 2 Tabs X3days, 1 Tab X3days and 1/2 Daily 20)  Mucinex D 60-600 Mg  Tb12 (Pseudoephedrine-Guaifenesin) .... Take 1 To 2 Tablets Every 12 Hours As Needed 21)  Alprazolam 0.25 Mg  Tabs (Alprazolam) .... Take 1 Tablet Every 6 Hours As Needed. 22)  Albuterol Sulfate (2.5 Mg/33ml) 0.083%  Nebu (Albuterol Sulfate) .Marland Kitchen.. 1 Every 3 Hours As Needed 23)  Meloxicam 7.5 Mg Tabs (Meloxicam) .... One Twice Daily With Meals As Needed For Joint Pain (Hurts To Move) 24)  Afrin Saline Nasal Mist 0.65 %  Soln (Saline) .Marland Kitchen.. 1 Puff Each Nostril Two Times A Day For 5 Days As Needed 25)  Reclast 5 Mg/134ml Soln (Zoledronic Acid) .... Infusion Yearly in January 26)  Avelox 400 Mg Tabs (Moxifloxacin Hcl) .Marland Kitchen.. 1 By Mouth Once Daily  Current Medications (verified): 1)  Multivitamins   Caps (Multiple Vitamin) .... Take 1 Tablet By Mouth Once A Day 2)  Vivelle-Dot 0.025 Mg/24hr  Pttw (Estradiol) .... Use On Tuesday and Saturday 3)  Synthroid 125 Mcg Tabs (Levothyroxine Sodium) .... Take 1 Tablet By Mouth Once A Day 4)  Citrucel  Powd (Methylcellulose (Laxative)) .Marland Kitchen.. 1 Tsp By Mouth Once Daily 5)  Nasonex 50 Mcg/act Susp (Mometasone Furoate) .Marland Kitchen.. 1-2 Puffs Two Times A Day 6)  Xalatan 0.005 %  Soln (Latanoprost) .... Use 1 Drop in Both Eyes At Bedtime 7)  Qvar 80 Mcg/act Aers (Beclomethasone Dipropionate) .... Inhale 2 Puffs Two Times A Day 8)  Plavix 75 Mg  Tabs (Clopidogrel Bisulfate) .... Take 1 Tablet By Mouth Once A Day 9)  Prednisone 10 Mg  Tabs (Prednisone) .... Take 1/2 Tablet Once Daily 10)  Klor-Con 20 Meq  Pack (Potassium Chloride) .... Use Two Times A Day 11)  Zyrtec Allergy 10 Mg  Tabs (Cetirizine Hcl) .... Take 1 Tablet By Mouth Once A Day 12)  Bisoprolol Fumarate 5 Mg  Tabs (Bisoprolol Fumarate) .... 1/2 Once Daily 13)  Nisoldipine 20 Mg Xr24h-Tab (Nisoldipine) .... Take 1 Tablet By Mouth Once A Day 14)  Hyzaar 100-25 Mg  Tabs (Losartan Potassium-Hctz) .... Take 1 Tablet By Mouth Once A  Day 15)  Prometrium 200 Mg Caps (Progesterone Micronized) .... Take 1 Capsule By Mouth At Bedtime 16)  Prevacid 15 Mg  Cpdr (Lansoprazole) .... Two Times A Day 17)  Calcium 500/d 500-400 Mg-Unit  Chew (Calcium-Vitamin D) .... Two Times A Day 18)  Famotidine 20 Mg Tabs (Famotidine) .... Take 1 Tab By Mouth At Bedtime 19)  Prednisone 10 Mg Tabs (Prednisone) .... 4 Tabs Till Better, Then 3 Tabs X3days, 2 Tabs X3days, 1 Tab X3days and 1/2 Daily 20)  Mucinex D 60-600 Mg  Tb12 (Pseudoephedrine-Guaifenesin) .... Take 1 To 2 Tablets Every 12 Hours As Needed 21)  Alprazolam 0.25 Mg  Tabs (Alprazolam) .... Take 1 Tablet Every 6  Hours As Needed. 22)  Albuterol Sulfate (2.5 Mg/69ml) 0.083%  Nebu (Albuterol Sulfate) .Marland Kitchen.. 1 Every 3 Hours As Needed 23)  Meloxicam 7.5 Mg Tabs (Meloxicam) .... One Twice Daily With Meals As Needed For Joint Pain (Hurts To Move) 24)  Afrin Saline Nasal Mist 0.65 %  Soln (Saline) .Marland Kitchen.. 1 Puff Each Nostril Two Times A Day For 5 Days As Needed 25)  Reclast 5 Mg/186ml Soln (Zoledronic Acid) .... Infusion Yearly in January  Allergies (verified): 1)  ! Iodine 2)  Sulfamethoxazole (Sulfamethoxazole) 3)  Erythromycin Ethylsuccinate  Past History:  Past Surgical History: Last updated: 12/04/2008 Laparoscopic cholecystectomy 1995 Sinus Surgery Tonsillectomy Foot Surgery Segmental SB Resection 05/2008  Family History: Last updated: 12/04/2008 MI father age 44 may have had a stroke at age 88 hypertension in sister multiple myeloma in maternal and paternal aunts as well Family History of Heart Disease: Both Parents  Social History: Last updated: 12/04/2008 Patient never smoked.  retired Runner, broadcasting/film/video denies alcohol use Patient has never smoked.  Alcohol Use - no Daily Caffeine Use -2 Illicit Drug Use - no  Risk Factors: Smoking Status: never (12/04/2008)  Past Medical History: HYPOTHYROIDISM (ICD-244.9) BRONCHIECTASIS W/ACUTE EXACERBATION (ICD-494.1)   - Xolair attempted  6/04 -> 9/05   - Immunotherapy stopped 8/09   - Started xyflo 8/09 > 9/09 no benefit   - VEST 2008 no benefit   - Flutter valve helpful March 17, 2009  HYPERLIPIDEMIA (ICD-272.4)   - Target LDL < 70 due to prev TIA's TRANSIENT ISCHEMIC ATTACKS, HX OF (ICD-V12.50)...........................................Marland KitchenReynolds OSTEOPOROSIS (ICD-733.00)     - Reclast January 2010, October 26, 2009-paperwork began.      - Bone Density 08/05/09  Spine  0.1    Left -2.2,  Right -1.6  Health Maintenance   -Td 3/05   -Pneumovax 11/04  and 9/09    -Med calendar 12/24/08   -CPX  January 07, 2009    -GYN health maint...........................................................................Marland KitchenDickstein Asthma Diverticulosis    - Colonoscopy 01/01/09.....................................................................Marland KitchenRussella Dar GERD Irritable Bowel Syndrome Hemorrhoids R Shoulder Pain..................................................................................Marland KitchenKendal  Review of Systems      See HPI  Vital Signs:  Patient profile:   77 year old female Height:      62 inches Weight:      111.38 pounds BMI:     20.45 O2 Sat:      91 % on Room air Temp:     97.7 degrees F oral Pulse rate:   86 / minute BP sitting:   100 / 68  (right arm) Cuff size:   regular  Vitals Entered By: Boone Master CNA (October 26, 2009 9:41 AM)  O2 Sat at Rest %:  91 O2 Flow:  Room air CC: pt here today to discuss yearly reclast infusion, but states is only slightly improved from last OV: still haveing prod cough with yellow mucus, some SOB and wheezing.  on regualr dose of prednisone. Is Patient Diabetic? No Comments Medications reviewed with patient Daytime contact number verified with patient. Boone Master CNA  October 26, 2009 9:45 AM    Physical Exam  Additional Exam:  wt 105 January 07, 2009 >  >117 August 24, 2009>>109 October 13, 2009 >>111 October 26, 2009  amb wf nad HEENT: nl dentition, turbinates, and  orophanx. Nl external ear canals without cough reflex NECK :  without JVD/Nodes/TM/ nl carotid upstrokes bilaterally LUNGS: no acc muscle use, pan exp sonorous rhonchi CV:  RRR  no s3 or murmur or increase in P2, no edema  ABD:  soft and nontender with nl excursion in the supine position. No bruits or organomegaly, bowel sounds nl MS:  warm without deformities, calf tenderness, cyanosis or clubbing    Impression & Recommendations:  Problem # 1:  BRONCHIECTASIS (ICD-494.0) Recent flare -slow to resolve cont w/ meds and flutter.   Problem # 2:  HYPERTENSION, BENIGN (ICD-401.1) b/p cont on low normal side,  Will decrease hyzaar 1/2 once daily, labs pending. follow up Dr. [name] in 4 weeks.  Her updated medication list for this problem includes:    Bisoprolol Fumarate 5 Mg Tabs (Bisoprolol fumarate) .Marland Kitchen... 1/2 once daily    Nisoldipine 20 Mg Xr24h-tab (Nisoldipine) .Marland Kitchen... Take 1 tablet by mouth once a day    Hyzaar 100-25 Mg Tabs (Losartan potassium-hctz) .Marland Kitchen... Take 1/2  tablet by mouth once a day  Orders: TLB-CBC Platelet - w/Differential (85025-CBCD) Est. Patient Level IV (16109)  Problem # 3:  OSTEOPOROSIS (ICD-733.00) Reclast paperwork started.  cont on calcium and vitd  vit d level pending.  she is bisphosphanate intolerant, tolerates Reclast well.   Medications Added to Medication List This Visit: 1)  Hyzaar 100-25 Mg Tabs (Losartan potassium-hctz) .... Take 1/2  tablet by mouth once a day  Complete Medication List: 1)  Multivitamins Caps (Multiple vitamin) .... Take 1 tablet by mouth once a day 2)  Vivelle-dot 0.025 Mg/24hr Pttw (Estradiol) .... Use on tuesday and saturday 3)  Synthroid 125 Mcg Tabs (Levothyroxine sodium) .... Take 1 tablet by mouth once a day 4)  Citrucel Powd (Methylcellulose (laxative)) .Marland Kitchen.. 1 tsp by mouth once daily 5)  Nasonex 50 Mcg/act Susp (Mometasone furoate) .Marland Kitchen.. 1-2 puffs two times a day 6)  Xalatan 0.005 % Soln (Latanoprost) .... Use 1 drop in  both eyes at bedtime 7)  Qvar 80 Mcg/act Aers (Beclomethasone dipropionate) .... Inhale 2 puffs two times a day 8)  Plavix 75 Mg Tabs (Clopidogrel bisulfate) .... Take 1 tablet by mouth once a day 9)  Prednisone 10 Mg Tabs (Prednisone) .... Take 1/2 tablet once daily 10)  Klor-con 20 Meq Pack (Potassium chloride) .... Use two times a day 11)  Zyrtec Allergy 10 Mg Tabs (Cetirizine hcl) .... Take 1 tablet by mouth once a day 12)  Bisoprolol Fumarate 5 Mg Tabs (Bisoprolol fumarate) .... 1/2 once daily 13)  Nisoldipine 20 Mg Xr24h-tab (Nisoldipine) .... Take 1 tablet by mouth once a day 14)  Hyzaar 100-25 Mg Tabs (Losartan potassium-hctz) .... Take 1/2  tablet by mouth once a day 15)  Prometrium 200 Mg Caps (Progesterone micronized) .... Take 1 capsule by mouth at bedtime 16)  Prevacid 15 Mg Cpdr (Lansoprazole) .... Two times a day 17)  Calcium 500/d 500-400 Mg-unit Chew (Calcium-vitamin d) .... Two times a day 18)  Famotidine 20 Mg Tabs (Famotidine) .... Take 1 tab by mouth at bedtime 19)  Prednisone 10 Mg Tabs (Prednisone) .... 4 tabs till better, then 3 tabs x3days, 2 tabs x3days, 1 tab x3days and 1/2 daily 20)  Mucinex D 60-600 Mg Tb12 (Pseudoephedrine-guaifenesin) .... Take 1 to 2 tablets every 12 hours as needed 21)  Alprazolam 0.25 Mg Tabs (Alprazolam) .... Take 1 tablet every 6 hours as needed. 22)  Albuterol Sulfate (2.5 Mg/58ml) 0.083% Nebu (Albuterol sulfate) .Marland Kitchen.. 1 every 3 hours as needed 23)  Meloxicam 7.5 Mg Tabs (Meloxicam) .... One twice daily with meals as needed for joint pain (hurts to move) 24)  Afrin Saline Nasal Mist 0.65 % Soln (Saline) .Marland Kitchen.. 1 puff each nostril two times a  day for 5 days as needed 25)  Reclast 5 Mg/123ml Soln (Zoledronic acid) .... Infusion yearly in january  Other Orders: TLB-BMP (Basic Metabolic Panel-BMET) (80048-METABOL) TLB-TSH (Thyroid Stimulating Hormone) (84443-TSH) TLB-B12 + Folate Pnl (16109_60454-U98/JXB) TLB-IBC Pnl (Iron/FE;Transferrin)  (83550-IBC) TLB-Udip w/ Micro (81001-URINE) T-Urine Culture (Spectrum Order) [id number]) Misc. Referral (Misc. Ref)  Patient Instructions: 1)  Decrease Hyzaar 100/25mg  1/2 once daily  2)  Continue on same meds.  3)  I will contact you about Reclast date.  4)  follow up Dr. [name] as scheduled.  5)  We updated your med calnedar sheet today, bring to each visit.  6)  I will call with lab results.  7)  Please contact office for sooner follow up if symptoms do not improve or worsen

## 2010-10-26 NOTE — Assessment & Plan Note (Signed)
Summary: Primary svc/ f/u ov  > retry cipro   Primary Provider/Referring Provider:  [name], MD  CC:  Cough-worse.  History of Present Illness: 77  yowf  never smoker with documented chronic sinusitis /bronchiectasis and a tendency to recurrent purulent exacerbations,  has  been self titrating prednisone with a ceiling of 40 mg per day and a floor of 5.     May 19, 2009 worse sob finished cipro one day prior to ov minimal improvement discolored sputum.   July 22, 2009 ov 6 wk followup.  Pt c/o arthritis pain "all over".  Especially bothers her back and her knees.  She has been seeing [name] for this problem.  can't take advil due to gi intolerance.  --Mobic started.   August 24, 2009 --Presents for follow up and med reivew. Continues to have trouble with back now w/ radiculopathy down right leg, currently seeing ortho , [name], PT, and now chiropractor.  Changed mobic to as needed b/c did not seem to help, takes if pain gets to bad.  Meds are correct w/ list.  Breathing is at baseline. Recent BMD w/ no sign change, due for BMD  infusion after 10/24/09.  Denies chest pain, dyspnea, orthopnea, hemoptysis, fever, n/v/d, edema, headache.   October 13, 2009--Presents for an acute office visit. Complains of prod cough with light green mucus, increased SOB, wheezing for 1 week, worse 3 days. Cough is keeping her up at night. Feels worn out. Has decreased appetitie. Has nasal congestion, stuffy, weakness- no energy. Denies chest pain, orthopnea, hemoptysis, fever, n/v/d, edema.   October 26, 2009--Presents for follow up and discuss Reclast. She has osteopenia w/ last BMD 11/10 w/ Tscore of -2.2. She is intolerant to Bisphosphnates, and has received Reclast x 2 infusions with no known difficulties. She is high risk for fx d/t chronic steroids. . She was seen 2 weeks w/ AEAB flare tx w/ Avelox and steroid burst. Hyzaar was held for 2 days d/t soft b/p. She is feeling better but still  weak. She is tired alot and wants blood checked today. Last labs reviewed from 4/10 were unrevealing.   January 11, 2010 cpx no new co's.  no tia or claudication.   no change recs  April 12, 2010 3 month followup.  Pt c/o rhinitis and "feeling bad" x 2 wks.  She also c/o HA and facial pressure on and off x 2 wks.  She states she thinks her TSH is high- having wt gain and dry skin. tapered pred down to 5 mg daily.  avelox no longer effective for purulent sputum, wants to try levaquin again as worked when got i from [employer] 06/2009. rec levaquin x 10  May 26, 2010 6 wk followup.  Pt c/o cough since this am- prod with light green/yellow sputum.  Breathing is the same- no better or worse.  pred down to 5 mg daily and using albuterol three times a day as baseline.   August 24, 2010 ov cough worse with greenish yellow suputm.  doesn't think levaquin working as well. Pt denies any significant sore throat, dysphagia, itching, sneezing,  nasal congestion or excess secretions,  fever, chills, sweats, unintended wt loss, pleuritic or exertional cp, hempoptysis, variability  in activity tolerance  orthopnea pnd or leg swelling. Pt also denies any obvious fluctuation in symptoms with weather or environmental change or other alleviating or aggravating factors.       Current Medications (verified): 1)  Multivitamins   Caps (Multiple  Vitamin) .... Take 1 Tablet By Mouth Once A Day 2)  Vivelle-Dot 0.025 Mg/24hr  Pttw (Estradiol) .... Use On Tuesday and Saturday 3)  Levothyroxine Sodium 100 Mcg Tabs (Levothyroxine Sodium) .Marland Kitchen.. 1 By Mouth Once Daily 4)  Citrucel  Powd (Methylcellulose (Laxative)) .Marland Kitchen.. 1 Tsp By Mouth Once Daily 5)  Nasonex 50 Mcg/act Susp (Mometasone Furoate) .Marland Kitchen.. 1-2 Puffs Two Times A Day 6)  Xalatan 0.005 %  Soln (Latanoprost) .... Use 1 Drop in Both Eyes At Bedtime 7)  Qvar 80 Mcg/act Aers (Beclomethasone Dipropionate) .... Inhale 2 Puffs Two Times A Day 8)  Plavix 75 Mg  Tabs (Clopidogrel  Bisulfate) .... Take 1 Tablet By Mouth Once A Day 9)  Prednisone 10 Mg  Tabs (Prednisone) .... Take 1/2 Tablet Once Daily 10)  Klor-Con 20 Meq  Pack (Potassium Chloride) .... Use Two Times A Day 11)  Zyrtec Allergy 10 Mg  Tabs (Cetirizine Hcl) .... Take 1 Tablet By Mouth Once A Day 12)  Bisoprolol Fumarate 5 Mg  Tabs (Bisoprolol Fumarate) .... 1/2 Once Daily 13)  Nisoldipine 20 Mg Xr24h-Tab (Nisoldipine) .... Take 1 Tablet By Mouth Once A Day 14)  Hyzaar 100-25 Mg  Tabs (Losartan Potassium-Hctz) .... Take 1/2  Tablet By Mouth Once A Day 15)  Prometrium 200 Mg Caps (Progesterone Micronized) .... Take 1 Capsule By Mouth At Bedtime 16)  Prevacid 15 Mg  Cpdr (Lansoprazole) .... Two Times A Day 17)  Calcium 500/d 500-400 Mg-Unit  Chew (Calcium-Vitamin D) .... Two Times A Day 18)  Famotidine 20 Mg Tabs (Famotidine) .... Take 1 Tab By Mouth At Bedtime 19)  Prednisone 10 Mg Tabs (Prednisone) .... 4 Tabs Till Better, Then 3 Tabs X3days, 2 Tabs X3days, 1 Tab X3days and 1/2 Daily 20)  Mucinex D 60-600 Mg  Tb12 (Pseudoephedrine-Guaifenesin) .... Take 1 To 2 Tablets Every 12 Hours As Needed 21)  Alprazolam 0.25 Mg  Tabs (Alprazolam) .... Take 1 Tablet Every 6 Hours As Needed. 22)  Albuterol Sulfate (2.5 Mg/77ml) 0.083%  Nebu (Albuterol Sulfate) .Marland Kitchen.. 1 Every 3 Hours As Needed 23)  Meloxicam 7.5 Mg Tabs (Meloxicam) .... One Twice Daily With Meals As Needed For Joint Pain (Hurts To Move) 24)  Afrin Saline Nasal Mist 0.65 %  Soln (Saline) .Marland Kitchen.. 1 Puff Each Nostril Two Times A Day For 5 Days As Needed 25)  Reclast 5 Mg/124ml Soln (Zoledronic Acid) .... Infusion Yearly in January 26)  Levaquin 750 Mg  Tabs (Levofloxacin) .... One Tablet By Mouth Daily X 5days  Allergies (verified): 1)  ! Iodine 2)  Sulfamethoxazole (Sulfamethoxazole) 3)  Erythromycin Ethylsuccinate  Past History:  Past Medical History: HYPOTHYROIDISM (ICD-244.9) BRONCHIECTASIS W/ACUTE EXACERBATION (ICD-494.1)   - Xolair attempted 6/04 ->  9/05   - Immunotherapy stopped 8/09   - Started xyflo 8/09 > 9/09 no benefit   - VEST 2008 no benefit   - Flutter valve helpful March 17, 2009    - Allergy profile  April 12, 2010 >> IgE 30 unremarkable HYPERLIPIDEMIA (ICD-272.4)   - Target LDL < 70 due to prev TIA's TRANSIENT ISCHEMIC ATTACKS, HX OF (ICD-V12.50).............................................Marland KitchenReynolds OSTEOPOROSIS (ICD-733.00)     - Reclast January 2009, 2010, October 26, 2009     - Bone Density 08/05/09  Spine  0.1    Left -2.2,  Right -1.6  Health Maintenance...................................................................................................Marland KitchenWert   -Td 3/05   -Pneumovax 11/04  and 9/09     -Med calendar 12/24/08   -CPX  January 11, 2010    -GYN health  maint...........................................................................Marland KitchenDickstein Asthma Diverticulosis    - Colonoscopy 01/01/09.....................................................................Marland KitchenRussella Dar GERD Irritable Bowel Syndrome Hemorrhoids R Shoulder Pain..................................................................................[name] Hypothyroid  Vital Signs:  Patient profile:   77 year old female Weight:      117 pounds BMI:     21.48 O2 Sat:      91 % on Room air Temp:     97.4 degrees F oral Pulse rate:   75 / minute BP sitting:   110 / 72  (left arm)  Vitals Entered By: Vernie Murders (August 24, 2010 10:52 AM)  O2 Flow:  Room air  Physical Exam  Additional Exam:  wt 117 August 24, 2009>>111 October 26, 2009 >  120 April 12, 2010 > 112 May 26, 2010 > 117 August 24, 2010  amb wf nad HEENT: nl dentition, turbinates, and orophanx. Nl external ear canals without cough reflex NECK :  without JVD/Nodes/TM/ nl carotid upstrokes bilaterally LUNGS: no acc muscle use, pan exp sonorous rhonchi CV:  RRR  no s3 or murmur or increase in P2, no edema  ABD:  soft and nontender with nl excursion in the supine position. No  bruits or organomegaly, bowel sounds nl MS:  warm without deformities, calf tenderness, cyanosis or clubbing      Impression & Recommendations:  Problem # 1:  BRONCHIECTASIS (ICD-494.0) Becoming refractory to rx, try cipro in high doses x 10 cylces.  alreadyfailed  vest  Medications Added to Medication List This Visit: 1)  Cipro 750 Mg Tabs (Ciprofloxacin hcl) .... One by mouth twice daily  Other Orders: Est. Patient Level III (95621) Prescription Created Electronically [id number])  Patient Instructions: 1)  Try cipro 750 twice daily x 10 days  for worsening discoloration of sputum 2)  See calendar for specific medication instructions and bring it back for each and every office visit for every healthcare provider you see.  Without it,  you may not receive the best quality medical care that we feel you deserve.  3)  Return to office in 3 months, sooner if needed  Prescriptions: CIPRO 750 MG  TABS (CIPROFLOXACIN HCL) One by mouth twice daily  #20 x 0   Entered and Authorized by:   Nyoka Cowden MD   Signed by:   Nyoka Cowden MD on 08/24/2010   Method used:   Electronically to        [id number] (retail)       [address]       [place], [place]  [address]       Ph: [phone] or [phone]       Fax: [phone]   RxID:   [id number]

## 2010-10-26 NOTE — Assessment & Plan Note (Signed)
Summary: Acute NP office visit - SOB   Primary Provider/Referring Provider:  [name], MD  CC:  prod cough with light green mucus, increased SOB, and wheezing x3days.  History of Present Illness: 77  yowf  never smoker with documented chronic sinusitis /bronchiectasis and a tendency to recurrent purulent exacerbations,  has  been self titrating prednisone with a ceiling of 40 mg per day and a floor of 5.    December 24, 2008 ov  for med calendar.  Changes are  Synthroid from 100 micrograms to 125 micrograms.  Added Famotidine 20 mg at bedtime.  Changed Levaquin as needed to Avelox as needed.     January 07, 2009 new episode x 1 week of wax and waning tingling  right lip and ? clumsy  Right hand after stopping plavix, resolved back on Plavix, no ha, nausea, viz co or aphasia, difficulty swallowing > eval by [name], no change in rx , resolved.  March 17, 2009 ov c/o  increase green sputum since increased prednisone 6/17 assoc with doe but not at rest. rec cycles of cipro 750 x 10days, helps sinuses some  May 19, 2009 worse sob finished cipro one day prior to ov minimal improvement discolored sputum.   July 22, 2009 ov 6 wk followup.  Pt c/o arthritis pain "all over".  Especially bothers her back and her knees.  She has been seeing [name] for this problem.  can't take advil due to gi intolerance.  --Mobic started.   August 24, 2009 --Presents for follow up and med reivew. Continues to have trouble with back now w/ radiculopathy down right leg, currently seeing ortho , [name], PT, and now chiropractor.  Changed mobic to as needed b/c did not seem to help, takes if pain gets to bad.  Meds are correct w/ list.  Breathing is at baseline. Recent BMD w/ no sign change, due for BMD  infusion after 10/24/09.  Denies chest pain, dyspnea, orthopnea, hemoptysis, fever, n/v/d, edema, headache.   October 13, 2009--Presents for an acute office visit. Complains of prod cough with light green  mucus, increased SOB, wheezing for 1 week, worse 3 days. Cough is keeping her up at night. Feels worn out. Has decreased appetitie. Has nasal congestion, stuffy, weakness- no energy. Denies chest pain, orthopnea, hemoptysis, fever, n/v/d, edema.      Medications Prior to Update: 1)  Multivitamins   Caps (Multiple Vitamin) .... Take 1 Tablet By Mouth Once A Day 2)  Vivelle-Dot 0.025 Mg/24hr  Pttw (Estradiol) .... Use On Tuesday and Saturday 3)  Synthroid 125 Mcg Tabs (Levothyroxine Sodium) .... Take 1 Tablet By Mouth Once A Day 4)  Citrucel  Powd (Methylcellulose (Laxative)) .Marland Kitchen.. 1 Tsp By Mouth Once Daily 5)  Nasonex 50 Mcg/act Susp (Mometasone Furoate) .Marland Kitchen.. 1-2 Puffs Two Times A Day 6)  Xalatan 0.005 %  Soln (Latanoprost) .... Use 1 Drop in Both Eyes At Bedtime 7)  Qvar 80 Mcg/act Aers (Beclomethasone Dipropionate) .... Inhale 2 Puffs Two Times A Day 8)  Plavix 75 Mg  Tabs (Clopidogrel Bisulfate) .... Take 1 Tablet By Mouth Once A Day 9)  Prednisone 10 Mg  Tabs (Prednisone) .... Take 1/2 Tablet Once Daily 10)  Klor-Con 20 Meq  Pack (Potassium Chloride) .... Use Two Times A Day 11)  Zyrtec Allergy 10 Mg  Tabs (Cetirizine Hcl) .... Take 1 Tablet By Mouth Once A Day 12)  Bisoprolol Fumarate 5 Mg  Tabs (Bisoprolol Fumarate) .... 1/2 Once Daily 13)  Nisoldipine 20 Mg Xr24h-Tab (Nisoldipine) .... Take 1 Tablet By Mouth Once A Day 14)  Hyzaar 100-25 Mg  Tabs (Losartan Potassium-Hctz) .... Take 1 Tablet By Mouth Once A Day 15)  Prometrium 200 Mg Caps (Progesterone Micronized) .... Take 1 Capsule By Mouth At Bedtime 16)  Prevacid 15 Mg  Cpdr (Lansoprazole) .... Two Times A Day 17)  Calcium 500/d 500-400 Mg-Unit  Chew (Calcium-Vitamin D) .... Two Times A Day 18)  Famotidine 20 Mg Tabs (Famotidine) .... Take 1 Tab By Mouth At Bedtime 19)  Avelox 400 Mg Tabs (Moxifloxacin Hcl) .... Take 1 Tablet By Mouth Once A Day 20)  Prednisone 10 Mg Tabs (Prednisone) .... 4 Tabs Till Better, Then 3 Tabs X3days, 2  Tabs X3days, 1 Tab X3days and 1/2 Daily 21)  Mucinex D 60-600 Mg  Tb12 (Pseudoephedrine-Guaifenesin) .... Take 1 To 2 Tablets Every 12 Hours As Needed 22)  Magic Mouthwash .... 1 Teaspoon Every 6 Hours As Needed 23)  Alprazolam 0.25 Mg  Tabs (Alprazolam) .... Take 1 Tablet Every 6 Hours As Needed. 24)  Albuterol Sulfate (2.5 Mg/57ml) 0.083%  Nebu (Albuterol Sulfate) .Marland Kitchen.. 1 Every 3 Hours As Needed 25)  Meloxicam 7.5 Mg Tabs (Meloxicam) .... One Twice Daily With Meals As Needed For Joint Pain (Hurts To Move) 26)  Afrin Saline Nasal Mist 0.65 %  Soln (Saline) .Marland Kitchen.. 1 Puff Each Nostril Two Times A Day For 5 Days As Needed 27)  Reclast 5 Mg/157ml Soln (Zoledronic Acid) .... Infusion Yearly in January  Current Medications (verified): 1)  Multivitamins   Caps (Multiple Vitamin) .... Take 1 Tablet By Mouth Once A Day 2)  Vivelle-Dot 0.025 Mg/24hr  Pttw (Estradiol) .... Use On Tuesday and Saturday 3)  Synthroid 125 Mcg Tabs (Levothyroxine Sodium) .... Take 1 Tablet By Mouth Once A Day 4)  Citrucel  Powd (Methylcellulose (Laxative)) .Marland Kitchen.. 1 Tsp By Mouth Once Daily 5)  Nasonex 50 Mcg/act Susp (Mometasone Furoate) .Marland Kitchen.. 1-2 Puffs Two Times A Day 6)  Xalatan 0.005 %  Soln (Latanoprost) .... Use 1 Drop in Both Eyes At Bedtime 7)  Qvar 80 Mcg/act Aers (Beclomethasone Dipropionate) .... Inhale 2 Puffs Two Times A Day 8)  Plavix 75 Mg  Tabs (Clopidogrel Bisulfate) .... Take 1 Tablet By Mouth Once A Day 9)  Prednisone 10 Mg  Tabs (Prednisone) .... Take 1/2 Tablet Once Daily 10)  Klor-Con 20 Meq  Pack (Potassium Chloride) .... Use Two Times A Day 11)  Zyrtec Allergy 10 Mg  Tabs (Cetirizine Hcl) .... Take 1 Tablet By Mouth Once A Day 12)  Bisoprolol Fumarate 5 Mg  Tabs (Bisoprolol Fumarate) .... 1/2 Once Daily 13)  Nisoldipine 20 Mg Xr24h-Tab (Nisoldipine) .... Take 1 Tablet By Mouth Once A Day 14)  Hyzaar 100-25 Mg  Tabs (Losartan Potassium-Hctz) .... Take 1 Tablet By Mouth Once A Day 15)  Prometrium 200 Mg Caps  (Progesterone Micronized) .... Take 1 Capsule By Mouth At Bedtime 16)  Prevacid 15 Mg  Cpdr (Lansoprazole) .... Two Times A Day 17)  Calcium 500/d 500-400 Mg-Unit  Chew (Calcium-Vitamin D) .... Two Times A Day 18)  Famotidine 20 Mg Tabs (Famotidine) .... Take 1 Tab By Mouth At Bedtime 19)  Prednisone 10 Mg Tabs (Prednisone) .... 4 Tabs Till Better, Then 3 Tabs X3days, 2 Tabs X3days, 1 Tab X3days and 1/2 Daily 20)  Mucinex D 60-600 Mg  Tb12 (Pseudoephedrine-Guaifenesin) .... Take 1 To 2 Tablets Every 12 Hours As Needed 21)  Alprazolam 0.25 Mg  Tabs (  Alprazolam) .... Take 1 Tablet Every 6 Hours As Needed. 22)  Albuterol Sulfate (2.5 Mg/43ml) 0.083%  Nebu (Albuterol Sulfate) .Marland Kitchen.. 1 Every 3 Hours As Needed 23)  Meloxicam 7.5 Mg Tabs (Meloxicam) .... One Twice Daily With Meals As Needed For Joint Pain (Hurts To Move) 24)  Afrin Saline Nasal Mist 0.65 %  Soln (Saline) .Marland Kitchen.. 1 Puff Each Nostril Two Times A Day For 5 Days As Needed 25)  Reclast 5 Mg/138ml Soln (Zoledronic Acid) .... Infusion Yearly in January  Allergies (verified): 1)  ! Iodine 2)  Sulfamethoxazole (Sulfamethoxazole) 3)  Erythromycin Ethylsuccinate  Past History:  Past Medical History: Last updated: 08/12/2009 HYPOTHYROIDISM (ICD-244.9) BRONCHIECTASIS W/ACUTE EXACERBATION (ICD-494.1)   - Xolair attempted 6/04 -> 9/05   - Immunotherapy stopped 8/09   - Started xyflo 8/09 > 9/09 no benefit   - VEST 2008 no benefit   - Flutter valve helpful March 17, 2009  HYPERLIPIDEMIA (ICD-272.4)   - Target LDL < 70 due to prev TIA's TRANSIENT ISCHEMIC ATTACKS, HX OF (ICD-V12.50)...........................................Marland KitchenReynolds OSTEOPOROSIS (ICD-733.00)     - Reclast January 2010     - Bone Density 08/05/09  Spine  0.1    Left -2.2,  Right -1.6  Health Maintenance   -Td 3/05   -Pneumovax 11/04  and 9/09    -Med calendar 12/24/08   -CPX  January 07, 2009    -GYN health  maint...........................................................................Marland KitchenDickstein Asthma Diverticulosis    - Colonoscopy 01/01/09.....................................................................Marland KitchenRussella Dar GERD Irritable Bowel Syndrome Hemorrhoids R Shoulder Pain..................................................................................Marland KitchenKendal  Past Surgical History: Last updated: 12/04/2008 Laparoscopic cholecystectomy 1995 Sinus Surgery Tonsillectomy Foot Surgery Segmental SB Resection 05/2008  Family History: Last updated: 12/04/2008 MI father age 73 may have had a stroke at age 3 hypertension in sister multiple myeloma in maternal and paternal aunts as well Family History of Heart Disease: Both Parents  Social History: Last updated: 12/04/2008 Patient never smoked.  retired Runner, broadcasting/film/video denies alcohol use Patient has never smoked.  Alcohol Use - no Daily Caffeine Use -2 Illicit Drug Use - no  Risk Factors: Smoking Status: never (12/04/2008)  Review of Systems      See HPI  Vital Signs:  Patient profile:   77 year old female Height:      62 inches Weight:      109.38 pounds BMI:     20.08 O2 Sat:      93 % on Room air Temp:     96.7 degrees F oral Pulse rate:   96 / minute BP sitting:   102 / 68  (left arm) Cuff size:   regular  Vitals Entered By: Boone Master CNA (October 13, 2009 9:36 AM)  O2 Flow:  Room air CC: prod cough with light green mucus, increased SOB, wheezing x3days Is Patient Diabetic? No Comments Medications reviewed with patient Daytime contact number verified with patient. Boone Master CNA  October 13, 2009 9:37 AM    Physical Exam  Additional Exam:  wt 105 January 07, 2009 > 111 March 17, 2009 > 115 May 19, 2009 > 114 July 22, 2009 >>117 August 24, 2009>>109 October 13, 2009  amb wf nad HEENT: nl dentition, turbinates, and orophanx. Nl external ear canals without cough reflex NECK :  without JVD/Nodes/TM/ nl carotid  upstrokes bilaterally LUNGS: no acc muscle use, pan exp sonorous rhonchi CV:  RRR  no s3 or murmur or increase in P2, no edema  ABD:  soft and nontender with nl excursion in the supine position. No bruits or organomegaly, bowel sounds  nl MS:  warm without deformities, calf tenderness, cyanosis or clubbing    Impression & Recommendations:  Problem # 1:  BRONCHIECTASIS (ICD-494.0) Exacerbation REC:   Avelox 400mg  once daily for 7 days Mucinex DM two times a day as needed cough/congestion Taper prednsioe --20mg  once daily 1 week, 10mg  once daily for 1 week then back to 5mg  once daily  Increase fluids.  Advance bland diet as toelrated.  Please contact office for sooner follow up if symptoms do not improve or worsen  follow up Dr. [name] 4-6 weeks and as needed  Dont forget to use flutter valve several times a day.   Problem # 2:  HYPERTENSION, BENIGN (ICD-401.1)  b/p on low normal  she has decreased intake, will hold Hold Hyzaar for 2 days  increase fluids/food intake   Her updated medication list for this problem includes:    Bisoprolol Fumarate 5 Mg Tabs (Bisoprolol fumarate) .Marland Kitchen... 1/2 once daily    Nisoldipine 20 Mg Xr24h-tab (Nisoldipine) .Marland Kitchen... Take 1 tablet by mouth once a day    Hyzaar 100-25 Mg Tabs (Losartan potassium-hctz) .Marland Kitchen... Take 1 tablet by mouth once a day  Orders: Est. Patient Level IV (31540)  Medications Added to Medication List This Visit: 1)  Zyrtec Allergy 10 Mg Tabs (Cetirizine hcl) .... Take 1 tablet by mouth once a day 2)  Avelox 400 Mg Tabs (Moxifloxacin hcl) .Marland Kitchen.. 1 by mouth once daily  Complete Medication List: 1)  Multivitamins Caps (Multiple vitamin) .... Take 1 tablet by mouth once a day 2)  Vivelle-dot 0.025 Mg/24hr Pttw (Estradiol) .... Use on tuesday and saturday 3)  Synthroid 125 Mcg Tabs (Levothyroxine sodium) .... Take 1 tablet by mouth once a day 4)  Citrucel Powd (Methylcellulose (laxative)) .Marland Kitchen.. 1 tsp by mouth once daily 5)  Nasonex 50  Mcg/act Susp (Mometasone furoate) .Marland Kitchen.. 1-2 puffs two times a day 6)  Xalatan 0.005 % Soln (Latanoprost) .... Use 1 drop in both eyes at bedtime 7)  Qvar 80 Mcg/act Aers (Beclomethasone dipropionate) .... Inhale 2 puffs two times a day 8)  Plavix 75 Mg Tabs (Clopidogrel bisulfate) .... Take 1 tablet by mouth once a day 9)  Prednisone 10 Mg Tabs (Prednisone) .... Take 1/2 tablet once daily 10)  Klor-con 20 Meq Pack (Potassium chloride) .... Use two times a day 11)  Zyrtec Allergy 10 Mg Tabs (Cetirizine hcl) .... Take 1 tablet by mouth once a day 12)  Bisoprolol Fumarate 5 Mg Tabs (Bisoprolol fumarate) .... 1/2 once daily 13)  Nisoldipine 20 Mg Xr24h-tab (Nisoldipine) .... Take 1 tablet by mouth once a day 14)  Hyzaar 100-25 Mg Tabs (Losartan potassium-hctz) .... Take 1 tablet by mouth once a day 15)  Prometrium 200 Mg Caps (Progesterone micronized) .... Take 1 capsule by mouth at bedtime 16)  Prevacid 15 Mg Cpdr (Lansoprazole) .... Two times a day 17)  Calcium 500/d 500-400 Mg-unit Chew (Calcium-vitamin d) .... Two times a day 18)  Famotidine 20 Mg Tabs (Famotidine) .... Take 1 tab by mouth at bedtime 19)  Prednisone 10 Mg Tabs (Prednisone) .... 4 tabs till better, then 3 tabs x3days, 2 tabs x3days, 1 tab x3days and 1/2 daily 20)  Mucinex D 60-600 Mg Tb12 (Pseudoephedrine-guaifenesin) .... Take 1 to 2 tablets every 12 hours as needed 21)  Alprazolam 0.25 Mg Tabs (Alprazolam) .... Take 1 tablet every 6 hours as needed. 22)  Albuterol Sulfate (2.5 Mg/49ml) 0.083% Nebu (Albuterol sulfate) .Marland Kitchen.. 1 every 3 hours as needed 23)  Meloxicam 7.5  Mg Tabs (Meloxicam) .... One twice daily with meals as needed for joint pain (hurts to move) 24)  Afrin Saline Nasal Mist 0.65 % Soln (Saline) .Marland Kitchen.. 1 puff each nostril two times a day for 5 days as needed 25)  Reclast 5 Mg/171ml Soln (Zoledronic acid) .... Infusion yearly in january 26)  Avelox 400 Mg Tabs (Moxifloxacin hcl) .Marland Kitchen.. 1 by mouth once daily  Patient  Instructions: 1)  Hold Hyzaar for 2 days  2)  Avelox 400mg  once daily for 7 days 3)  Mucinex DM two times a day as needed cough/congestion 4)  Taper prednsioe --20mg  once daily 1 week, 10mg  once daily for 1 week then back to 5mg  once daily  5)  Increase fluids.  6)  Advance bland diet as toelrated.  7)  Please contact office for sooner follow up if symptoms do not improve or worsen  8)  follow up Dr. [name] 4-6 weeks and as needed  9)  Dont forget to use flutter valve several times a day.  Prescriptions: AVELOX 400 MG TABS (MOXIFLOXACIN HCL) 1 by mouth once daily  #7 x 0   Entered and Authorized by:   [name] NP   Signed by:   Tammy Parrett NP on 10/13/2009   Method used:   Electronically to        [id number] ([facility]       [address]       [place], [place]  [address]       Ph: [phone] or [phone]       Fax: [phone]   RxID:   [id number]    Immunization History:  Pneumovax Immunization History:    Pneumovax:  historical (06/26/2008)

## 2010-10-26 NOTE — Assessment & Plan Note (Signed)
Summary: Primary svc/ cpx   Primary Provider/Referring Provider:  [name], MD  CC:  CPX, PT c/o back pain which radiated to hip, lower extremities and groin, and pain is a 7 on a scale of 1-10 with 10 being worst pain ever felt.  History of Present Illness: 77  yowf  never smoker with documented chronic sinusitis /bronchiectasis and a tendency to recurrent purulent exacerbations,  has  been self titrating prednisone with a ceiling of 40 mg per day and a floor of 5.     May 19, 2009 worse sob finished cipro one day prior to ov minimal improvement discolored sputum.   July 22, 2009 ov 6 wk followup.  Pt c/o arthritis pain "all over".  Especially bothers her back and her knees.  She has been seeing [name] for this problem.  can't take advil due to gi intolerance.  --Mobic started.   August 24, 2009 --Presents for follow up and med reivew. Continues to have trouble with back now w/ radiculopathy down right leg, currently seeing ortho , [name], PT, and now chiropractor.  Changed mobic to as needed b/c did not seem to help, takes if pain gets to bad.  Meds are correct w/ list.  Breathing is at baseline. Recent BMD w/ no sign change, due for BMD  infusion after 10/24/09.  Denies chest pain, dyspnea, orthopnea, hemoptysis, fever, n/v/d, edema, headache.   October 13, 2009--Presents for an acute office visit. Complains of prod cough with light green mucus, increased SOB, wheezing for 1 week, worse 3 days. Cough is keeping her up at night. Feels worn out. Has decreased appetitie. Has nasal congestion, stuffy, weakness- no energy. Denies chest pain, orthopnea, hemoptysis, fever, n/v/d, edema.   October 26, 2009--Presents for follow up and discuss Reclast. She has osteopenia w/ last BMD 11/10 w/ Tscore of -2.2. She is intolerant to Bisphosphnates, and has received Reclast x 2 infusions with no known difficulties. She is high risk for fx d/t chronic steroids. . She was seen 2 weeks w/  AEAB flare tx w/ Avelox and steroid burst. Hyzaar was held for 2 days d/t soft b/p. She is feeling better but still weak. She is tired alot and wants blood checked today. Last labs reviewed from 4/10 were unrevealing.  November 19, 2009 better on lower thyroid, half dose of hyzaar and back to Prednisone.   rec d/c calendar  January 11, 2010 cpx no new co's.  no tia or claudication.   Pt denies any significant sore throat, dysphagia, itching, sneezing,  nasal congestion or excess secretions,  fever, chills, sweats, unintended wt loss, pleuritic or exertional cp, hempoptysis, change in activity tolerance  orthopnea pnd or leg swelling Pt also denies any obvious fluctuation in symptoms with weather or environmental change or other alleviating or aggravating factors.       Current Medications (verified): 1)  Multivitamins   Caps (Multiple Vitamin) .... Take 1 Tablet By Mouth Once A Day 2)  Vivelle-Dot 0.025 Mg/24hr  Pttw (Estradiol) .... Use On Tuesday and Saturday 3)  Levothyroxine Sodium 100 Mcg Tabs (Levothyroxine Sodium) .Marland Kitchen.. 1 By Mouth Once Daily 4)  Citrucel  Powd (Methylcellulose (Laxative)) .Marland Kitchen.. 1 Tsp By Mouth Once Daily 5)  Nasonex 50 Mcg/act Susp (Mometasone Furoate) .Marland Kitchen.. 1-2 Puffs Two Times A Day 6)  Xalatan 0.005 %  Soln (Latanoprost) .... Use 1 Drop in Both Eyes At Bedtime 7)  Qvar 80 Mcg/act Aers (Beclomethasone Dipropionate) .... Inhale 2 Puffs Two Times A Day  8)  Plavix 75 Mg  Tabs (Clopidogrel Bisulfate) .... Take 1 Tablet By Mouth Once A Day 9)  Prednisone 10 Mg  Tabs (Prednisone) .... Take 1/2 Tablet Once Daily 10)  Klor-Con 20 Meq  Pack (Potassium Chloride) .... Use Two Times A Day 11)  Zyrtec Allergy 10 Mg  Tabs (Cetirizine Hcl) .... Take 1 Tablet By Mouth Once A Day 12)  Bisoprolol Fumarate 5 Mg  Tabs (Bisoprolol Fumarate) .... 1/2 Once Daily 13)  Nisoldipine 20 Mg Xr24h-Tab (Nisoldipine) .... Take 1 Tablet By Mouth Once A Day 14)  Hyzaar 100-25 Mg  Tabs (Losartan Potassium-Hctz)  .... Take 1/2  Tablet By Mouth Once A Day 15)  Prometrium 200 Mg Caps (Progesterone Micronized) .... Take 1 Capsule By Mouth At Bedtime 16)  Prevacid 15 Mg  Cpdr (Lansoprazole) .... Two Times A Day 17)  Calcium 500/d 500-400 Mg-Unit  Chew (Calcium-Vitamin D) .... Two Times A Day 18)  Famotidine 20 Mg Tabs (Famotidine) .... Take 1 Tab By Mouth At Bedtime 19)  Prednisone 10 Mg Tabs (Prednisone) .... 4 Tabs Till Better, Then 3 Tabs X3days, 2 Tabs X3days, 1 Tab X3days and 1/2 Daily 20)  Mucinex D 60-600 Mg  Tb12 (Pseudoephedrine-Guaifenesin) .... Take 1 To 2 Tablets Every 12 Hours As Needed 21)  Alprazolam 0.25 Mg  Tabs (Alprazolam) .... Take 1 Tablet Every 6 Hours As Needed. 22)  Albuterol Sulfate (2.5 Mg/71ml) 0.083%  Nebu (Albuterol Sulfate) .Marland Kitchen.. 1 Every 3 Hours As Needed 23)  Meloxicam 7.5 Mg Tabs (Meloxicam) .... One Twice Daily With Meals As Needed For Joint Pain (Hurts To Move) 24)  Afrin Saline Nasal Mist 0.65 %  Soln (Saline) .Marland Kitchen.. 1 Puff Each Nostril Two Times A Day For 5 Days As Needed 25)  Reclast 5 Mg/175ml Soln (Zoledronic Acid) .... Infusion Yearly in January  Allergies (verified): 1)  ! Iodine 2)  Sulfamethoxazole (Sulfamethoxazole) 3)  Erythromycin Ethylsuccinate   Past History:  Past Medical History: HYPOTHYROIDISM (ICD-244.9) BRONCHIECTASIS W/ACUTE EXACERBATION (ICD-494.1)   - Xolair attempted 6/04 -> 9/05   - Immunotherapy stopped 8/09   - Started xyflo 8/09 > 9/09 no benefit   - VEST 2008 no benefit   - Flutter valve helpful March 17, 2009  HYPERLIPIDEMIA (ICD-272.4)   - Target LDL < 70 due to prev TIA's TRANSIENT ISCHEMIC ATTACKS, HX OF (ICD-V12.50).............................................Marland KitchenReynolds OSTEOPOROSIS (ICD-733.00)     - Reclast January 2009, 2010, October 26, 2009     - Bone Density 08/05/09  Spine  0.1    Left -2.2,  Right -1.6  Health Maintenance   -Td 3/05   -Pneumovax 11/04  and 9/09     -Med calendar 12/24/08   -CPX  January 11, 2010    -GYN  health maint...........................................................................Marland KitchenDickstein Asthma Diverticulosis    - Colonoscopy 01/01/09.....................................................................Marland KitchenRussella Dar GERD Irritable Bowel Syndrome Hemorrhoids R Shoulder Pain..................................................................................[name] Hypothyroid --TSH 0.17>>decreased Synthroid once daily October 26, 2009 Leukosyctosis October 26, 2009, WBC 21k >   Past Surgical History: Reviewed history from 12/04/2008 and no changes required. Laparoscopic cholecystectomy 1995 Sinus Surgery Tonsillectomy Foot Surgery Segmental SB Resection 05/2008  Family History: Reviewed history from 12/04/2008 and no changes required. MI father age 60 may have had a stroke at age 15 hypertension in sister multiple myeloma in maternal and paternal aunts as well Family History of Heart Disease: Both Parents  Social History: Reviewed history from 12/04/2008 and no changes required. Patient never smoked.  retired Runner, broadcasting/film/video denies alcohol use Patient has never smoked.  Alcohol Use -  no Daily Caffeine Use -2 Illicit Drug Use - no  Vital Signs:  Patient profile:   77 year old female Height:      62 inches Weight:      117.2 pounds O2 Sat:      93 % on room air Temp:     98 degrees F oral Pulse rate:   81 / minute BP sitting:   122 / 70  (left arm) Cuff size:   regular  Vitals Entered By: Denna Haggard, CMA (January 11, 2010 8:53 AM)  O2 Sat at Rest %:  93 O2 Flow:  room air CC: CPX,PT c/o back pain which radiated to hip, lower extremities and groin, pain is a 7 on a scale of 1-10 with 10 being worst pain ever felt   Physical Exam  Additional Exam:  wt 105 January 07, 2009 >  >117 August 24, 2009>>111 October 26, 2009 > 114 November 19, 2009 > 117 January 11, 2010  amb wf nad HEENT: nl dentition, turbinates, and orophanx. Nl external ear canals without  cough reflex NECK :  without JVD/Nodes/TM/ nl carotid upstrokes bilaterally LUNGS: no acc muscle use, pan exp sonorous rhonchi CV:  RRR  no s3 or murmur or increase in P2, no edema  ABD:  soft and nontender with nl excursion in the supine position. No bruits or organomegaly, bowel sounds nl MS:  warm without deformities, calf tenderness, cyanosis or clubbing Neuro alert, nl gait, no focal deficits, intact sensorium and recall Pedal pulses present bilaterally and sym   Vitamin D (25-Hydroxy)                             35 ng/mL                    30-89  Cholesterol               172 mg/dL                   8-119     ATP III Classification            Desirable:  < 200 mg/dL                    Borderline High:  200 - 239 mg/dL               High:  > = 240 mg/dL   Triglycerides             82.0 mg/dL                  1.4-782.9     Normal:  <150 mg/dL     Borderline High:  562 - 199 mg/dL   HDL                       13.08 mg/dL                 >65.78   VLDL Cholesterol          16.4 mg/dL                  4.6-96.2   LDL Cholesterol           78 mg/dL                    9-52  CHO/HDL Ratio:  CHD Risk  2                    Men          Women     1/2 Average Risk     3.4          3.3     Average Risk          5.0          4.4     2X Average Risk          9.6          7.1     3X Average Risk          15.0          11.0                           Tests: (2) CBC Platelet w/Diff (CBCD)   White Cell Count          9.1 K/uL                    4.5-10.5   Red Cell Count            4.60 Mil/uL                 3.87-5.11   Hemoglobin                13.8 g/dL                   57.8-46.9   Hematocrit                40.2 %                      36.0-46.0   MCV                       87.4 fl                     78.0-100.0   MCHC                      34.3 g/dL                   62.9-52.8   RDW                       14.6 %                      11.5-14.6   Platelet Count             400.0 K/uL                  150.0-400.0   Neutrophil %              57.7 %                      43.0-77.0   Lymphocyte %              27.3 %                      12.0-46.0   Monocyte %           [H]  12.1 %  3.0-12.0   Eosinophils%              2.1 %                       0.0-5.0   Basophils %               0.8 %                       0.0-3.0   Neutrophill Absolute      5.3 K/uL                    1.4-7.7   Lymphocyte Absolute       2.5 K/uL                    0.7-4.0   Monocyte Absolute    [H]  1.1 K/uL                    0.1-1.0  Eosinophils, Absolute                             0.2 K/uL                    0.0-0.7   Basophils Absolute        0.1 K/uL                    0.0-0.1  Tests: (3) BMP (METABOL)   Sodium                    140 mEq/L                   135-145   Potassium                 3.7 mEq/L                   3.5-5.1   Chloride                  103 mEq/L                   96-112   Carbon Dioxide            31 mEq/L                    19-32   Glucose                   91 mg/dL                    16-10   BUN                       9 mg/dL                     9-60   Creatinine                0.9 mg/dL                   4.5-4.0   Calcium                   8.9 mg/dL  8.4-10.5   GFR                       64.98 mL/min                >60  Tests: (4) Hepatic/Liver Function Panel (HEPATIC)   Total Bilirubin           0.4 mg/dL                   1.6-1.0   Direct Bilirubin          0.1 mg/dL                   9.6-0.4   Alkaline Phosphatase      41 U/L                      39-117   AST                       19 U/L                      0-37   ALT                       15 U/L                      0-35   Total Protein             7.4 g/dL                    5.4-0.9   Albumin                   3.7 g/dL                    8.1-1.9  Tests: (5) TSH (TSH)   FastTSH                   2.77 uIU/mL                 0.35-5.50  Tests: (6) UDip Only (UDIP)    Color                     LT. YELLOW       RANGE:  Yellow;Lt. Yellow   Clarity                   CLEAR                       Clear   Specific Gravity          1.010                       1.000 - 1.030   Urine Ph                  6.5                         5.0-8.0   Protein                   NEGATIVE                    Negative   Urine Glucose  NEGATIVE                    Negative   Ketones                   NEGATIVE                    Negative   Urine Bilirubin           NEGATIVE                    Negative   Blood                     NEGATIVE                    Negative   Urobilinogen              0.2                         0.0 - 1.0   Leukocyte Esterace        NEGATIVE                    Negative   Nitrite                   NEGATIVE                    Negative  CXR  Procedure date:  01/11/2010  Findings:      Comparison: Chest x-ray of 01/07/2009   Findings: There is parenchymal opacity at the lung bases, some which appears chronic and consistent with scarring.  Superimposed pneumonia particularly right mid lung field cannot be excluded. The lungs remain hyperaerated consistent with COPD.  The heart is within normal limits in size.  No bony abnormality is seen.   IMPRESSION: Prominent markings remain at the lung bases consistent with scarring.  Superimposed pneumonia particularly in the right mid lung cannot be excluded.  Recommend follow-up chest x-ray. COPD.  Impression & Recommendations:  Problem # 1:  BRONCHIECTASIS (ICD-494.0)  Relatively well controlled albeit on a very complex rx.    Each maintenance medication was reviewed in detail including most importantly the difference between maintenance and as needed and under what circumstances the prns are to be used. This was done in the context of a medication calendar review which provided the patient with a user-friendly unambiguous mechanism for medication administration and reconciliation and provides an  action plan for all active problems. It is critical that this be shown to every doctor  for modification during the office visit if necessary so the patient can use it as a working document.   Problem # 2:  HYPERTENSION, BENIGN (ICD-401.1)  Her updated medication list for this problem includes:    Bisoprolol Fumarate 5 Mg Tabs (Bisoprolol fumarate) .Marland Kitchen... 1/2 once daily    Nisoldipine 20 Mg Xr24h-tab (Nisoldipine) .Marland Kitchen... Take 1 tablet by mouth once a day    Hyzaar 100-25 Mg Tabs (Losartan potassium-hctz) .Marland Kitchen... Take 1/2  tablet by mouth once a day  Orders: T-2 View CXR (71020TC) TLB-BMP (Basic Metabolic Panel-BMET) (80048-METABOL) TLB-Hepatic/Liver Function Pnl (80076-HEPATIC) TLB-Udip ONLY (81003-UDIP)  Problem # 3:  HYPERLIPIDEMIA (ICD-272.4)  Orders: TLB-Lipid Panel (80061-LIPID) TLB-CBC Platelet - w/Differential (85025-CBCD)  Labs Reviewed: SGOT: 19 (01/07/2009)   SGPT: 12 (01/07/2009)   HDL:62.00 (01/07/2009), 71.2 (02/26/2007)  LDL:88 (01/07/2009),  99 (02/26/2007) vs LDL 78 now   Chol:163 (01/07/2009), 191 (02/26/2007)  Trig:65.0 (01/07/2009), 106 (02/26/2007)  Problem # 4:  OSTEOPOROSIS (ICD-733.00) Vit d ok, on reclast  Problem # 5:  HYPOTHYROIDISM (ICD-244.9)  Her updated medication list for this problem includes:    Levothyroxine Sodium 100 Mcg Tabs (Levothyroxine sodium) .Marland Kitchen... 1 by mouth once daily  Orders: TLB-TSH (Thyroid Stimulating Hormone) (84443-TSH)  Labs Reviewed: TSH: 0.17 (10/26/2009)   > now wnl on present adjusted rx Chol: 163 (01/07/2009)   HDL: 62.00 (01/07/2009)   LDL: 88 (01/07/2009)   TG: 65.0 (01/07/2009)  Other Orders: T-Vitamin D (25-Hydroxy) ([address]109-60454) Est. Patient 65& > (09811)  Patient Instructions: 1)  Call [phone] for your results w/in next 3 days - if there's something important  I feel you need to know,  I'll be in touch with you directly.  2)  Return to office in 3 months, sooner if needed     [facility] ECG  ID:  [id number] Patient: [name], [name] DOB: 12-Mar-1933 Age: 77 Years Old Sex: Female Race: White Physician: [name] Technician: Vernie Murders Height: 62 Weight: 117.2 Status: Unconfirmed Past Medical History:  HYPOTHYROIDISM (ICD-244.9) BRONCHIECTASIS W/ACUTE EXACERBATION (ICD-494.1)   - Xolair attempted 6/04 -> 9/05   - Immunotherapy stopped 8/09   - Started xyflo 8/09 > 9/09 no benefit   - VEST 2008 no benefit   - Flutter valve helpful March 17, 2009  HYPERLIPIDEMIA (ICD-272.4)   - Target LDL < 70 due to prev TIA's TRANSIENT ISCHEMIC ATTACKS, HX OF (ICD-V12.50).............................................Marland KitchenReynolds OSTEOPOROSIS (ICD-733.00)     - Reclast January 2010, October 26, 2009-paperwork started     - Bone Density 08/05/09  Spine  0.1    Left -2.2,  Right -1.6  Health Maintenance   -Td 3/05   -Pneumovax 11/04  and 9/09    -Med calendar 12/24/08   -CPX  January 07, 2009    -GYN health maint...........................................................................Marland KitchenDickstein Asthma Diverticulosis    - Colonoscopy 01/01/09.....................................................................Marland KitchenRussella Dar GERD Irritable Bowel Syndrome Hemorrhoids R Shoulder Pain..................................................................................Marland KitchenKendal Hypothyroid --TSH 0.17>>decreased Synthroid once daily October 26, 2009 Leukosyctosis October 26, 2009, WBC 21k >    Recorded: 01/11/2010 09:08 AM P/PR: 118 ms / 158 ms - Heart rate (maximum exercise) QRS: 82 QT/QTc/QTd: 382 ms / 408 ms / 80 ms - Heart rate (maximum exercise)  P/QRS/T axis: 69 deg / 63 deg / 39 deg - Heart rate (maximum exercise)  Heartrate: 75 bpm  Interpretation:   sinus rhythm  possible LVH   age-corrected Sokolow index (SV1+RV5 or V6) = 3.7 mV   age-corrected R in left precordial leads = 2.6 mV   age-corrected vectorial R in extremity leads = 2.2 mV  possible septal infarct   QS in V1 V2   Abnormal  ECG     CXR  Procedure date:  01/11/2010  Findings:      Comparison: Chest x-ray of 01/07/2009   Findings: There is parenchymal opacity at the lung bases, some which appears chronic and consistent with scarring.  Superimposed pneumonia particularly right mid lung field cannot be excluded. The lungs remain hyperaerated consistent with COPD.  The heart is within normal limits in size.  No bony abnormality is seen.   IMPRESSION: Prominent markings remain at the lung bases consistent with scarring.  Superimposed pneumonia particularly in the right mid lung cannot be excluded.  Recommend follow-up chest x-ray. COPD.

## 2010-10-26 NOTE — Letter (Signed)
Summary: [facility]  [facility]   Imported By: Sherian Rein 10/07/2009 10:08:21  _____________________________________________________________________  External Attachment:    Type:   Image     Comment:   External Document

## 2010-10-26 NOTE — Assessment & Plan Note (Signed)
Summary: Primary svc/ worsening cough try levaquin    Primary Provider/Referring Provider:  [name], MD  CC:  3 month followup.  Pt c/o rhinitis and "feeling bad" x several wks.  She also c/o HA and facial pressure on and off x 2 wks.  She states she thinks her TSH is high- having wt gain and dry skin.Marland Kitchen  History of Present Illness: 77  yowf  never smoker with documented chronic sinusitis /bronchiectasis and a tendency to recurrent purulent exacerbations,  has  been self titrating prednisone with a ceiling of 40 mg per day and a floor of 5.     May 19, 2009 worse sob finished cipro one day prior to ov minimal improvement discolored sputum.   July 22, 2009 ov 6 wk followup.  Pt c/o arthritis pain "all over".  Especially bothers her back and her knees.  She has been seeing [name] for this problem.  can't take advil due to gi intolerance.  --Mobic started.   November 75, 2010 --Presents for follow up and med reivew. Continues to have trouble with back now w/ radiculopathy down right leg, currently seeing ortho , [name], PT, and now chiropractor.  Changed mobic to as needed b/c did not seem to help, takes if pain gets to bad.  Meds are correct w/ list.  Breathing is at baseline. Recent BMD w/ no sign change, due for BMD  infusion after 10/24/09.  Denies chest pain, dyspnea, orthopnea, hemoptysis, fever, n/v/d, edema, headache.   October 13, 2009--Presents for an acute office visit. Complains of prod cough with light green mucus, increased SOB, wheezing for 1 week, worse 3 days. Cough is keeping her up at night. Feels worn out. Has decreased appetitie. Has nasal congestion, stuffy, weakness- no energy. Denies chest pain, orthopnea, hemoptysis, fever, n/v/d, edema.   October 26, 2009--Presents for follow up and discuss Reclast. She has osteopenia w/ last BMD 11/10 w/ Tscore of -2.2. She is intolerant to Bisphosphnates, and has received Reclast x 2 infusions with no known  difficulties. She is high risk for fx d/t chronic steroids. . She was seen 2 weeks w/ AEAB flare tx w/ Avelox and steroid burst. Hyzaar was held for 2 days d/t soft b/p. She is feeling better but still weak. She is tired alot and wants blood checked today. Last labs reviewed from 4/10 were unrevealing.   January 11, 2010 cpx no new co's.  no tia or claudication.   no change recs  April 12, 2010 3 month followup.  Pt c/o rhinitis and "feeling bad" x 2 wks.  She also c/o HA and facial pressure on and off x 2 wks.  She states she thinks her TSH is high- having wt gain and dry skin. tapered pred down to 5 mg daily.  avelox no longer effective for purulent sputum, wants to try levaquin again as worked when got i from [employer] 06/2009.  Pt denies any significant sore throat, dysphagia, itching, sneezing,  nasal congestion or excess secretions,  fever, chills, sweats, unintended wt loss, pleuritic or exertional cp, hempoptysis, change in poor  activity tolerance  orthopnea pnd or leg swelling   Current Medications (verified): 1)  Multivitamins   Caps (Multiple Vitamin) .... Take 1 Tablet By Mouth Once A Day 2)  Vivelle-Dot 0.025 Mg/24hr  Pttw (Estradiol) .... Use On Tuesday and Saturday 3)  Levothyroxine Sodium 100 Mcg Tabs (Levothyroxine Sodium) .Marland Kitchen.. 1 By Mouth Once Daily 4)  Citrucel  Powd (Methylcellulose (Laxative)) .Marland KitchenMarland KitchenMarland Kitchen 1  Tsp By Mouth Once Daily 5)  Nasonex 50 Mcg/act Susp (Mometasone Furoate) .Marland Kitchen.. 1-2 Puffs Two Times A Day 6)  Xalatan 0.005 %  Soln (Latanoprost) .... Use 1 Drop in Both Eyes At Bedtime 7)  Qvar 80 Mcg/act Aers (Beclomethasone Dipropionate) .... Inhale 2 Puffs Two Times A Day 8)  Plavix 75 Mg  Tabs (Clopidogrel Bisulfate) .... Take 1 Tablet By Mouth Once A Day 9)  Prednisone 10 Mg  Tabs (Prednisone) .... Take 1/2 Tablet Once Daily 10)  Klor-Con 20 Meq  Pack (Potassium Chloride) .... Use Two Times A Day 11)  Zyrtec Allergy 10 Mg  Tabs (Cetirizine Hcl) .... Take 1 Tablet By Mouth Once A  Day 12)  Bisoprolol Fumarate 5 Mg  Tabs (Bisoprolol Fumarate) .... 1/2 Once Daily 13)  Nisoldipine 20 Mg Xr24h-Tab (Nisoldipine) .... Take 1 Tablet By Mouth Once A Day 14)  Hyzaar 100-25 Mg  Tabs (Losartan Potassium-Hctz) .... Take 1/2  Tablet By Mouth Once A Day 15)  Prometrium 200 Mg Caps (Progesterone Micronized) .... Take 1 Capsule By Mouth At Bedtime 16)  Prevacid 15 Mg  Cpdr (Lansoprazole) .... Two Times A Day 17)  Calcium 500/d 500-400 Mg-Unit  Chew (Calcium-Vitamin D) .... Two Times A Day 18)  Famotidine 20 Mg Tabs (Famotidine) .... Take 1 Tab By Mouth At Bedtime 19)  Prednisone 10 Mg Tabs (Prednisone) .... 4 Tabs Till Better, Then 3 Tabs X3days, 2 Tabs X3days, 1 Tab X3days and 1/2 Daily 20)  Mucinex D 60-600 Mg  Tb12 (Pseudoephedrine-Guaifenesin) .... Take 1 To 2 Tablets Every 12 Hours As Needed 21)  Alprazolam 0.25 Mg  Tabs (Alprazolam) .... Take 1 Tablet Every 6 Hours As Needed. 22)  Albuterol Sulfate (2.5 Mg/32ml) 0.083%  Nebu (Albuterol Sulfate) .Marland Kitchen.. 1 Every 3 Hours As Needed 23)  Meloxicam 7.5 Mg Tabs (Meloxicam) .... One Twice Daily With Meals As Needed For Joint Pain (Hurts To Move) 24)  Afrin Saline Nasal Mist 0.65 %  Soln (Saline) .Marland Kitchen.. 1 Puff Each Nostril Two Times A Day For 5 Days As Needed 25)  Reclast 5 Mg/162ml Soln (Zoledronic Acid) .... Infusion Yearly in January  Allergies (verified): 1)  ! Iodine 2)  Sulfamethoxazole (Sulfamethoxazole) 3)  Erythromycin Ethylsuccinate  Past History:  Past Medical History: HYPOTHYROIDISM (ICD-244.9) BRONCHIECTASIS W/ACUTE EXACERBATION (ICD-494.1)   - Xolair attempted 6/04 -> 9/05   - Immunotherapy stopped 8/09   - Started xyflo 8/09 > 9/09 no benefit   - VEST 2008 no benefit   - Flutter valve helpful March 17, 2009    - Allergy profile sent April 12, 2010 >> HYPERLIPIDEMIA (ICD-272.4)   - Target LDL < 70 due to prev TIA's TRANSIENT ISCHEMIC ATTACKS, HX OF  (ICD-V12.50).............................................Marland KitchenReynolds OSTEOPOROSIS (ICD-733.00)     - Reclast January 2009, 2010, October 26, 2009     - Bone Density 08/05/09  Spine  0.1    Left -2.2,  Right -1.6  Health Maintenance   -Td 3/05   -Pneumovax 11/04  and 9/09     -Med calendar 12/24/08   -CPX  January 11, 2010    -GYN health maint...........................................................................Marland KitchenDickstein Asthma Diverticulosis    - Colonoscopy 01/01/09.....................................................................Marland KitchenRussella Dar GERD Irritable Bowel Syndrome Hemorrhoids R Shoulder Pain..................................................................................Marland KitchenKendal/ Olin Hypothyroid  Vital Signs:  Patient profile:   77 year old female Weight:      120 pounds O2 Sat:      90 % on Room air Temp:     97.6 degrees F oral Pulse rate:   82 / minute  BP sitting:   110 / 68  (right arm)  Vitals Entered By: Vernie Murders (April 12, 2010 2:05 PM)  O2 Flow:  Room air  Physical Exam  Additional Exam:  wt 105 January 07, 2009 >  >117 August 24, 2009>>111 October 26, 2009 > 114 November 19, 2009 > 117 January 11, 2010 > 120 April 12, 2010  amb wf nad HEENT: nl dentition, turbinates, and orophanx. Nl external ear canals without cough reflex NECK :  without JVD/Nodes/TM/ nl carotid upstrokes bilaterally LUNGS: no acc muscle use, pan exp sonorous rhonchi CV:  RRR  no s3 or murmur or increase in P2, no edema  ABD:  soft and nontender with nl excursion in the supine position. No bruits or organomegaly, bowel sounds nl MS:  warm without deformities, calf tenderness, cyanosis or clubbing    White Cell Count     [H]  13.8 K/uL                   4.5-10.5   Red Cell Count            4.32 Mil/uL                 3.87-5.11   Hemoglobin                12.7 g/dL                   43.3-29.5   Hematocrit                37.5 %                      36.0-46.0   MCV                        86.8 fl                     78.0-100.0   MCHC                      34.0 g/dL                   18.8-41.6   RDW                       14.5 %                      11.5-14.6   Platelet Count       [H]  422.0 K/uL                  150.0-400.0   Neutrophil %         [H]  83.4 %                      43.0-77.0   Lymphocyte %         [L]  9.8 %                       12.0-46.0   Monocyte %                5.7 %                       3.0-12.0   Eosinophils%  0.5 %                       0.0-5.0   Basophils %               0.6 %                       0.0-3.0   Neutrophill Absolute [H]  11.5 K/uL                   1.4-7.7   Lymphocyte Absolute       1.4 K/uL                    0.7-4.0   Monocyte Absolute         0.8 K/uL                    0.1-1.0  Eosinophils, Absolute                             0.1 K/uL                    0.0-0.7   Basophils Absolute        0.1 K/uL                    0.0-0.1  Tests: (2) TSH (TSH)   FastTSH                   0.62 uIU/mL                 0.35-5.50   Impression & Recommendations:  Problem # 1:  BRONCHIECTASIS (ICD-494.0)    Each maintenance medication was reviewed in detail including most importantly the difference between maintenance and as needed and under what circumstances the prns are to be used. This was done in the context of a medication calendar review which provided the patient with a user-friendly unambiguous mechanism for medication administration and reconciliation and provides an action plan for all active problems. It is critical that this be shown to every doctor  for modification during the office visit if necessary so the patient can use it as a working document.   Try levaquin, cautioned re tendonitis  Problem # 2:  HYPOTHYROIDISM (ICD-244.9)   TSH 0.17>>decreased Synthroid once daily October 26, 2009 >>  April 12, 2010 on 100 mcgs  = 0.6 so no change in rx Labs Reviewed: TSH: 2.77 (01/11/2010)    Chol: 172 (01/11/2010)   HDL:  77.40 (01/11/2010)   LDL: 78 (01/11/2010)   TG: 82.0 (01/11/2010)  Problem # 3:  OSTEOPOROSIS (ICD-733.00) vit d ok at cpx Reclast January 2009, 2010, October 26, 2009  Medications Added to Medication List This Visit: 1)  Levaquin 750 Mg Tabs (Levofloxacin) .... One tablet by mouth daily  Other Orders: T-Allergy Profile [facility] [place], [place], [place], [place], [place] [phone]) Prescription Created Electronically [id number]) Est. Patient Level IV (40981) TLB-CBC Platelet - w/Differential (85025-CBCD) TLB-TSH (Thyroid Stimulating Hormone) (84443-TSH)  Patient Instructions: 1)  Levquin 750 one daily x10 days 2)  See calendar for specific medication instructions and bring it back for each and every office visit for every healthcare provider you see.  Without it,  you may not receive the best quality medical care that we feel you deserve.  3)  Please schedule a follow-up appointment in 6 weeks,  sooner if needed  Prescriptions: LEVAQUIN 750 MG  TABS (LEVOFLOXACIN) One tablet by mouth daily  #10 x 0   Entered and Authorized by:   Nyoka Cowden MD   Signed by:   Nyoka Cowden MD on 04/12/2010   Method used:   Electronically to        [id number] (retail)       [address]       [place], [place]  [address]       Ph: [phone] or [phone]       Fax: [phone]   RxID:   [id number]

## 2010-10-26 NOTE — Assessment & Plan Note (Signed)
Summary: Primary svc/ f/u ov   Primary Provider/Referring Provider:  [name], MD  CC:  Followup.  Pt still c/o prod cough with yellow sputum.  States that her breathing is the same- no better or worse.  No new complaints today.Marland Kitchen  History of Present Illness: 77  yowf  never smoker with documented chronic sinusitis /bronchiectasis and a tendency to recurrent purulent exacerbations,  has  been self titrating prednisone with a ceiling of 40 mg per day and a floor of 5.     May 19, 2009 worse sob finished cipro one day prior to ov minimal improvement discolored sputum.   July 22, 2009 ov 6 wk followup.  Pt c/o arthritis pain "all over".  Especially bothers her back and her knees.  She has been seeing [name] for this problem.  can't take advil due to gi intolerance.  --Mobic started.   August 24, 2009 --Presents for follow up and med reivew. Continues to have trouble with back now w/ radiculopathy down right leg, currently seeing ortho , [name], PT, and now chiropractor.  Changed mobic to as needed b/c did not seem to help, takes if pain gets to bad.  Meds are correct w/ list.  Breathing is at baseline. Recent BMD w/ no sign change, due for BMD  infusion after 10/24/09.  Denies chest pain, dyspnea, orthopnea, hemoptysis, fever, n/v/d, edema, headache.   October 13, 2009--Presents for an acute office visit. Complains of prod cough with light green mucus, increased SOB, wheezing for 1 week, worse 3 days. Cough is keeping her up at night. Feels worn out. Has decreased appetitie. Has nasal congestion, stuffy, weakness- no energy. Denies chest pain, orthopnea, hemoptysis, fever, n/v/d, edema.   October 26, 2009--Presents for follow up and discuss Reclast. She has osteopenia w/ last BMD 11/10 w/ Tscore of -2.2. She is intolerant to Bisphosphnates, and has received Reclast x 2 infusions with no known difficulties. She is high risk for fx d/t chronic steroids. . She was seen 2 weeks w/  AEAB flare tx w/ Avelox and steroid burst. Hyzaar was held for 2 days d/t soft b/p. She is feeling better but still weak. She is tired alot and wants blood checked today. Last labs reviewed from 4/10 were unrevealing.  November 19, 2009 better on lower thyroid, half dose of hyzaar and back to Prednisone.  Pt denies any significant sore throat, dysphagia, itching, sneezing,  nasal congestion or excess secretions,  fever, chills, sweats, unintended wt loss, pleuritic or exertional cp, hempoptysis, change in activity tolerance  orthopnea pnd or leg swelling Pt also denies any obvious fluctuation in symptoms with weather or environmental change or other alleviating or aggravating factors.       Current Medications (verified): 1)  Multivitamins   Caps (Multiple Vitamin) .... Take 1 Tablet By Mouth Once A Day 2)  Vivelle-Dot 0.025 Mg/24hr  Pttw (Estradiol) .... Use On Tuesday and Saturday 3)  Levothyroxine Sodium 100 Mcg Tabs (Levothyroxine Sodium) .Marland Kitchen.. 1 By Mouth Once Daily 4)  Citrucel  Powd (Methylcellulose (Laxative)) .Marland Kitchen.. 1 Tsp By Mouth Once Daily 5)  Nasonex 50 Mcg/act Susp (Mometasone Furoate) .Marland Kitchen.. 1-2 Puffs Two Times A Day 6)  Xalatan 0.005 %  Soln (Latanoprost) .... Use 1 Drop in Both Eyes At Bedtime 7)  Qvar 80 Mcg/act Aers (Beclomethasone Dipropionate) .... Inhale 2 Puffs Two Times A Day 8)  Plavix 75 Mg  Tabs (Clopidogrel Bisulfate) .... Take 1 Tablet By Mouth Once A Day 9)  Prednisone  10 Mg  Tabs (Prednisone) .... Take 1/2 Tablet Once Daily 10)  Klor-Con 20 Meq  Pack (Potassium Chloride) .... Use Two Times A Day 11)  Zyrtec Allergy 10 Mg  Tabs (Cetirizine Hcl) .... Take 1 Tablet By Mouth Once A Day 12)  Bisoprolol Fumarate 5 Mg  Tabs (Bisoprolol Fumarate) .... 1/2 Once Daily 13)  Nisoldipine 20 Mg Xr24h-Tab (Nisoldipine) .... Take 1 Tablet By Mouth Once A Day 14)  Hyzaar 100-25 Mg  Tabs (Losartan Potassium-Hctz) .... Take 1/2  Tablet By Mouth Once A Day 15)  Prometrium 200 Mg Caps  (Progesterone Micronized) .... Take 1 Capsule By Mouth At Bedtime 16)  Prevacid 15 Mg  Cpdr (Lansoprazole) .... Two Times A Day 17)  Calcium 500/d 500-400 Mg-Unit  Chew (Calcium-Vitamin D) .... Two Times A Day 18)  Famotidine 20 Mg Tabs (Famotidine) .... Take 1 Tab By Mouth At Bedtime 19)  Prednisone 10 Mg Tabs (Prednisone) .... 4 Tabs Till Better, Then 3 Tabs X3days, 2 Tabs X3days, 1 Tab X3days and 1/2 Daily 20)  Mucinex D 60-600 Mg  Tb12 (Pseudoephedrine-Guaifenesin) .... Take 1 To 2 Tablets Every 12 Hours As Needed 21)  Alprazolam 0.25 Mg  Tabs (Alprazolam) .... Take 1 Tablet Every 6 Hours As Needed. 22)  Albuterol Sulfate (2.5 Mg/14ml) 0.083%  Nebu (Albuterol Sulfate) .Marland Kitchen.. 1 Every 3 Hours As Needed 23)  Meloxicam 7.5 Mg Tabs (Meloxicam) .... One Twice Daily With Meals As Needed For Joint Pain (Hurts To Move) 24)  Afrin Saline Nasal Mist 0.65 %  Soln (Saline) .Marland Kitchen.. 1 Puff Each Nostril Two Times A Day For 5 Days As Needed 25)  Reclast 5 Mg/124ml Soln (Zoledronic Acid) .... Infusion Yearly in January 26)  Iron 325 (65 Fe) Mg Tabs (Ferrous Sulfate) .Marland Kitchen.. 1 Once Daily  Allergies (verified): 1)  ! Iodine 2)  Sulfamethoxazole (Sulfamethoxazole) 3)  Erythromycin Ethylsuccinate  Past History:  Past Medical History: HYPOTHYROIDISM (ICD-244.9) BRONCHIECTASIS W/ACUTE EXACERBATION (ICD-494.1)   - Xolair attempted 6/04 -> 9/05   - Immunotherapy stopped 8/09   - Started xyflo 8/09 > 9/09 no benefit   - VEST 2008 no benefit   - Flutter valve helpful March 17, 2009  HYPERLIPIDEMIA (ICD-272.4)   - Target LDL < 70 due to prev TIA's TRANSIENT ISCHEMIC ATTACKS, HX OF (ICD-V12.50).............................................Marland KitchenReynolds OSTEOPOROSIS (ICD-733.00)     - Reclast January 2010, October 26, 2009-paperwork started     - Bone Density 08/05/09  Spine  0.1    Left -2.2,  Right -1.6  Health Maintenance   -Td 3/05   -Pneumovax 11/04  and 9/09    -Med calendar 12/24/08   -CPX  January 07, 2009    -GYN  health maint...........................................................................Marland KitchenDickstein Asthma Diverticulosis    - Colonoscopy 01/01/09.....................................................................Marland KitchenRussella Dar GERD Irritable Bowel Syndrome Hemorrhoids R Shoulder Pain..................................................................................Marland KitchenKendal Hypothyroid --TSH 0.17>>decreased Synthroid once daily October 26, 2009 Leukosyctosis October 26, 2009, WBC 21k >   Vital Signs:  Patient profile:   77 year old female Weight:      114.50 pounds O2 Sat:      94 % on Room air Temp:     97.6 degrees F oral Pulse rate:   78 / minute BP sitting:   128 / 78  (left arm)  Vitals Entered By: Vernie Murders (November 19, 2009 11:53 AM)  O2 Flow:  Room air  Physical Exam  Additional Exam:  wt 105 January 07, 2009 >  >117 August 24, 2009>>109 October 13, 2009 >>111 October 26, 2009 > 114 November 19, 2009  amb wf nad HEENT: nl dentition, turbinates, and orophanx. Nl external ear canals without cough reflex NECK :  without JVD/Nodes/TM/ nl carotid upstrokes bilaterally LUNGS: no acc muscle use, pan exp sonorous rhonchi CV:  RRR  no s3 or murmur or increase in P2, no edema  ABD:  soft and nontender with nl excursion in the supine position. No bruits or organomegaly, bowel sounds nl MS:  warm without deformities, calf tenderness, cyanosis or clubbing    Impression & Recommendations:  Problem # 1:  BRONCHIECTASIS (ICD-494.0) Relatively well controlled albeit on a very complex rx.    Each maintenance medication was reviewed in detail including most importantly the difference between maintenance and as needed and under what circumstances the prns are to be used. This was done in the context of a medication calendar review which provided the patient with a user-friendly unambiguous mechanism for medication administration and reconciliation and provides an action plan for all  active problems. It is critical that this be shown to every doctor  for modification during the office visit if necessary so the patient can use it as a working document.   Problem # 2:  FATIGUE (ICD-780.79)  No sign fe defciency, ok to stop iron  Orders: Est. Patient Level III ([address])  Problem # 3:  HYPOTHYROIDISM (ICD-244.9) Calendar updated to reflect lower dose of synthroid   Her updated medication list for this problem includes:    Levothyroxine Sodium 100 Mcg Tabs (Levothyroxine sodium) .Marland Kitchen... 1 by mouth once daily  Labs Reviewed: TSH: 0.17 (10/26/2009)    Chol: 163 (01/07/2009)   HDL: 62.00 (01/07/2009)   LDL: 88 (01/07/2009)   TG: 65.0 (01/07/2009)  Medications Added to Medication List This Visit: 1)  Iron 325 (65 Fe) Mg Tabs (Ferrous sulfate) .Marland Kitchen.. 1 once daily  Patient Instructions: 1)  Stop the iron pills 2)  See calendar for specific medication instructions and bring it back for each and every office visit for every healthcare provider you see.  Without it,  you may not receive the best quality medical care that we feel you deserve.  3)  Return after April 14 for CPX

## 2010-10-26 NOTE — Assessment & Plan Note (Signed)
Summary: Primary svc/ f/u bronchiectasis and hbp   Primary Provider/Referring Provider:  [name], MD  CC:  6 wk followup.  Pt c/o cough since this am- prod with light green/yellow sputum.  Breathing is the same- no better or worse.  [name]  History of Present Illness: 77  yowf  never smoker with documented chronic sinusitis /bronchiectasis and a tendency to recurrent purulent exacerbations,  has  been self titrating prednisone with a ceiling of 40 mg per day and a floor of 5.     May 19, 2009 worse sob finished cipro one day prior to ov minimal improvement discolored sputum.   July 22, 2009 ov 6 wk followup.  Pt c/o arthritis pain "all over".  Especially bothers her back and her knees.  She has been seeing [name] for this problem.  can't take advil due to gi intolerance.  --Mobic started.   August 24, 2009 --Presents for follow up and med reivew. Continues to have trouble with back now w/ radiculopathy down right leg, currently seeing ortho , [name], PT, and now chiropractor.  Changed mobic to as needed b/c did not seem to help, takes if pain gets to bad.  Meds are correct w/ list.  Breathing is at baseline. Recent BMD w/ no sign change, due for BMD  infusion after 10/24/09.  Denies chest pain, dyspnea, orthopnea, hemoptysis, fever, n/v/d, edema, headache.   October 13, 2009--Presents for an acute office visit. Complains of prod cough with light green mucus, increased SOB, wheezing for 1 week, worse 3 days. Cough is keeping her up at night. Feels worn out. Has decreased appetitie. Has nasal congestion, stuffy, weakness- no energy. Denies chest pain, orthopnea, hemoptysis, fever, n/v/d, edema.   October 26, 2009--Presents for follow up and discuss Reclast. She has osteopenia w/ last BMD 11/10 w/ Tscore of -2.2. She is intolerant to Bisphosphnates, and has received Reclast x 2 infusions with no known difficulties. She is high risk for fx d/t chronic steroids. . She was seen 2 weeks  w/ AEAB flare tx w/ Avelox and steroid burst. Hyzaar was held for 2 days d/t soft b/p. She is feeling better but still weak. She is tired alot and wants blood checked today. Last labs reviewed from 4/10 were unrevealing.   January 11, 2010 cpx no new co's.  no tia or claudication.   no change recs  April 12, 2010 3 month followup.  Pt c/o rhinitis and "feeling bad" x 2 wks.  She also c/o HA and facial pressure on and off x 2 wks.  She states she thinks her TSH is high- having wt gain and dry skin. tapered pred down to 5 mg daily.  avelox no longer effective for purulent sputum, wants to try levaquin again as worked when got i from [facility] 06/2009. rec levaquin x 10  May 26, 2010 6 wk followup.  Pt c/o cough since this am- prod with light green/yellow sputum.  Breathing is the same- no better or worse.  pred down to 5 mg daily and using albuterol three times a day as baseline. Pt denies any significant sore throat, dysphagia, itching, sneezing,  nasal congestion or excess secretions,  fever, chills, sweats, unintended wt loss, pleuritic or exertional cp, hempoptysis, change in activity tolerance  orthopnea pnd or leg swelling.  Current Medications (verified): 1)  Multivitamins   Caps (Multiple Vitamin) .... Take 1 Tablet By Mouth Once A Day 2)  Vivelle-Dot 0.025 Mg/24hr  Pttw (Estradiol) .... Use On  Tuesday and Saturday 3)  Levothyroxine Sodium 100 Mcg Tabs (Levothyroxine Sodium) .[name].. 1 By Mouth Once Daily 4)  Citrucel  Powd (Methylcellulose (Laxative)) .[name].. 1 Tsp By Mouth Once Daily 5)  Nasonex 50 Mcg/act Susp (Mometasone Furoate) .[name].. 1-2 Puffs Two Times A Day 6)  Xalatan 0.005 %  Soln (Latanoprost) .... Use 1 Drop in Both Eyes At Bedtime 7)  Qvar 80 Mcg/act Aers (Beclomethasone Dipropionate) .... Inhale 2 Puffs Two Times A Day 8)  Plavix 75 Mg  Tabs (Clopidogrel Bisulfate) .... Take 1 Tablet By Mouth Once A Day 9)  Prednisone 10 Mg  Tabs (Prednisone) .... Take 1/2 Tablet Once Daily 10)  Klor-Con 20  Meq  Pack (Potassium Chloride) .... Use Two Times A Day 11)  Zyrtec Allergy 10 Mg  Tabs (Cetirizine Hcl) .... Take 1 Tablet By Mouth Once A Day 12)  Bisoprolol Fumarate 5 Mg  Tabs (Bisoprolol Fumarate) .... 1/2 Once Daily 13)  Nisoldipine 20 Mg Xr24h-Tab (Nisoldipine) .... Take 1 Tablet By Mouth Once A Day 14)  Hyzaar 100-25 Mg  Tabs (Losartan Potassium-Hctz) .... Take 1/2  Tablet By Mouth Once A Day 15)  Prometrium 200 Mg Caps (Progesterone Micronized) .... Take 1 Capsule By Mouth At Bedtime 16)  Prevacid 15 Mg  Cpdr (Lansoprazole) .... Two Times A Day 17)  Calcium 500/d 500-400 Mg-Unit  Chew (Calcium-Vitamin D) .... Two Times A Day 18)  Famotidine 20 Mg Tabs (Famotidine) .... Take 1 Tab By Mouth At Bedtime 19)  Prednisone 10 Mg Tabs (Prednisone) .... 4 Tabs Till Better, Then 3 Tabs X3days, 2 Tabs X3days, 1 Tab X3days and 1/2 Daily 20)  Mucinex D 60-600 Mg  Tb12 (Pseudoephedrine-Guaifenesin) .... Take 1 To 2 Tablets Every 12 Hours As Needed 21)  Alprazolam 0.25 Mg  Tabs (Alprazolam) .... Take 1 Tablet Every 6 Hours As Needed. 22)  Albuterol Sulfate (2.5 Mg/16ml) 0.083%  Nebu (Albuterol Sulfate) .[name].. 1 Every 3 Hours As Needed 23)  Meloxicam 7.5 Mg Tabs (Meloxicam) .... One Twice Daily With Meals As Needed For Joint Pain (Hurts To Move) 24)  Afrin Saline Nasal Mist 0.65 %  Soln (Saline) .[name].. 1 Puff Each Nostril Two Times A Day For 5 Days As Needed 25)  Reclast 5 Mg/140ml Soln (Zoledronic Acid) .... Infusion Yearly in January  Allergies (verified): 1)  ! Iodine 2)  Sulfamethoxazole (Sulfamethoxazole) 3)  Erythromycin Ethylsuccinate  Past History:  Past Medical History: HYPOTHYROIDISM (ICD-244.9) BRONCHIECTASIS W/ACUTE EXACERBATION (ICD-494.1)   - Xolair attempted 6/04 -> 9/05   - Immunotherapy stopped 8/09   - Started xyflo 8/09 > 9/09 no benefit   - VEST 2008 no benefit   - Flutter valve helpful March 17, 2009    - Allergy profile sent April 12, 2010 >> HYPERLIPIDEMIA (ICD-272.4)   -  Target LDL < 70 due to prev TIA's TRANSIENT ISCHEMIC ATTACKS, HX OF (ICD-V12.50).............................................[name] KitchenReynolds OSTEOPOROSIS (ICD-733.00)     - Reclast January 2009, 2010, October 26, 2009     - Bone Density 08/05/09  Spine  0.1    Left -2.2,  Right -1.6  Health Maintenance...................................................................................................[name] KitchenWert   -Td 3/05   -Pneumovax 11/04  and 9/09     -Med calendar 12/24/08   -CPX  January 11, 2010    -GYN health maint...........................................................................[name] KitchenDickstein Asthma Diverticulosis    - Colonoscopy 01/01/09.....................................................................[name] KitchenRussella Dar GERD Irritable Bowel Syndrome Hemorrhoids R Shoulder Pain..................................................................................[name] KitchenKendal/ Olin Hypothyroid  Vital Signs:  Patient profile:   77 year old female Weight:      121.25 pounds O2  Sat:      93 % on Room air Temp:     97.0 degrees F oral Pulse rate:   74 / minute BP sitting:   120 / 80  (left arm)  Vitals Entered By: Vernie Murders (May 26, 2010 2:47 PM)  O2 Flow:  Room air  Physical Exam  Additional Exam:  wt 117 August 24, 2009>>111 October 26, 2009 > 114 November 19, 2009 > 117 January 11, 2010 > 120 April 12, 2010 > 212 May 26, 2010  amb wf nad HEENT: nl dentition, turbinates, and orophanx. Nl external ear canals without cough reflex NECK :  without JVD/Nodes/TM/ nl carotid upstrokes bilaterally LUNGS: no acc muscle use, pan exp sonorous rhonchi CV:  RRR  no s3 or murmur or increase in P2, no edema  ABD:  soft and nontender with nl excursion in the supine position. No bruits or organomegaly, bowel sounds nl MS:  warm without deformities, calf tenderness, cyanosis or clubbing      Impression & Recommendations:  Problem # 1:  BRONCHIECTASIS (ICD-494.0) steroid dep airways dz rel well  compendsated on present regimen with good control of purulent sputum    Each maintenance medication was reviewed in detail including most importantly the difference between maintenance and as needed and under what circumstances the prns are to be used. This was done in the context of a medication calendar review which provided the patient with a user-friendly unambiguous mechanism for medication administration and reconciliation and provides an action plan for all active problems. It is critical that this be shown to every doctor  for modification during the office visit if necessary so the patient can use it as a working document.   Problem # 2:  HYPERTENSION, BENIGN (ICD-401.1)  Her updated medication list for this problem includes:    Bisoprolol Fumarate 5 Mg Tabs (Bisoprolol fumarate) .[name]... 1/2 once daily    Nisoldipine 20 Mg Xr24h-tab (Nisoldipine) .[name]... Take 1 tablet by mouth once a day    Hyzaar 100-25 Mg Tabs (Losartan potassium-hctz) .[name]... Take 1/2  tablet by mouth once a day  ok on rx  Medications Added to Medication List This Visit: 1)  Levaquin 750 Mg Tabs (Levofloxacin) .... One tablet by mouth daily x 5days  Other Orders: Prescription Created Electronically [id number]) Est. Patient Level III (60454)  Patient Instructions: 1)  See calendar for specific medication instructions and bring it back for each and every office visit for every healthcare provider you see.  Without it,  you may not receive the best quality medical care that we feel you deserve.  2)  ok to use levaquin x 5 days for nasty mucus, stop if tendons become painful 3)  Return to office in 3 months, sooner if needed   Prescriptions: LEVAQUIN 750 MG  TABS (LEVOFLOXACIN) One tablet by mouth daily x 5days  #5 x 11   Entered and Authorized by:   Nyoka Cowden MD   Signed by:   Nyoka Cowden MD on 05/26/2010   Method used:   Electronically to        [id number] (retail)       [address]        [place], [place]  [address]       Ph: [phone] or [phone]       Fax: [phone]   RxID:   [id number]

## 2010-10-28 NOTE — Progress Notes (Signed)
Summary: reclast not due til 11/07/09  Phone Note Call from Patient Call back at [facility] Phone [phone]   Caller: Patient Call For: tammy parrett Summary of Call: pt has a question re: reclast injections.  Initial call taken by: Tivis Ringer, CNA,  September 28, 2010 4:08 PM  Follow-up for Phone Call        Spoke with the pt and she is wanting to get her reclast injection scheduled, she states this will be her 4th. Please advise. Carron Curie CMA  September 28, 2010 5:05 PM   Additional Follow-up for Phone Call Additional follow up Details #1::        her last Reclast was 11/07/09 have to wait until then before can do labs and paperwork I will call her in couple of weeks. Additional Follow-up by: [name] NP,  September 28, 2010 5:28 PM    Additional Follow-up for Phone Call Additional follow up Details #2::    pt advised of above. Zackery Barefoot CMA  September 28, 2010 5:31 PM

## 2010-11-10 ENCOUNTER — Encounter: Payer: Self-pay | Admitting: Adult Health

## 2010-11-17 NOTE — Miscellaneous (Signed)
Summary: reclast paperwork    Clinical Lists Changes  Orders: Added new Referral order of Misc. Referral (Misc. Ref) - Signed

## 2010-11-22 ENCOUNTER — Encounter: Payer: Self-pay | Admitting: Internal Medicine

## 2010-11-22 ENCOUNTER — Ambulatory Visit (INDEPENDENT_AMBULATORY_CARE_PROVIDER_SITE_OTHER): Payer: Medicare Other | Admitting: Internal Medicine

## 2010-11-22 DIAGNOSIS — J479 Bronchiectasis, uncomplicated: Secondary | ICD-10-CM

## 2010-12-02 ENCOUNTER — Encounter (INDEPENDENT_AMBULATORY_CARE_PROVIDER_SITE_OTHER): Payer: Self-pay | Admitting: *Deleted

## 2010-12-02 ENCOUNTER — Other Ambulatory Visit: Payer: Medicare Other

## 2010-12-02 ENCOUNTER — Other Ambulatory Visit: Payer: Self-pay | Admitting: Internal Medicine

## 2010-12-02 DIAGNOSIS — M810 Age-related osteoporosis without current pathological fracture: Secondary | ICD-10-CM

## 2010-12-02 LAB — BASIC METABOLIC PANEL
BUN: 13 mg/dL (ref 6–23)
Chloride: 97 mEq/L (ref 96–112)
Potassium: 3.9 mEq/L (ref 3.5–5.1)

## 2010-12-02 NOTE — Assessment & Plan Note (Signed)
Summary: Primary svc/ try adding dulera 200   Primary Provider/Referring Provider:  [name], MD  CC:  Dyspnea the same.  Cough is worse.Marland Kitchen  History of Present Illness: 77 yowf  never smoker with documented chronic sinusitis /bronchiectasis and a tendency to recurrent purulent exacerbations,  has  been self titrating prednisone with a ceiling of 40 mg per day and a floor of 5 extensively evaluated at [facility] and by [name] with neg w/u x atopic.       October 13, 2009--Presents for an acute office visit. Complains of prod cough with light green mucus, increased SOB, wheezing for 1 week, worse 3 days. Cough is keeping her up at night. Feels worn out. Has decreased appetitie. Has nasal congestion, stuffy, weakness- no energy. Denies chest pain, orthopnea, hemoptysis, fever, n/v/d, edema.   October 26, 2009--Presents for follow up and discuss Reclast. She has osteopenia w/ last BMD 11/10 w/ Tscore of -2.2. She is intolerant to Bisphosphnates, and has received Reclast x 2 infusions with no known difficulties. She is high risk for fx d/t chronic steroids. . She was seen 2 weeks w/ AEAB flare tx w/ Avelox and steroid burst. Hyzaar was held for 2 days d/t soft b/p. She is feeling better but still weak. She is tired alot and wants blood checked today. Last labs reviewed from 4/10 were unrevealing.    January 11, 2010 cpx no new co's.  no tia or claudication.   no change recs  April 12, 2010 3 month followup.  Pt c/o rhinitis and "feeling bad" x 2 wks.  She also c/o HA and facial pressure on and off x 2 wks.  She states she thinks her TSH is high- having wt gain and dry skin. tapered pred down to 5 mg daily.  avelox no longer effective for purulent sputum, wants to try levaquin again as worked when got i from [facility] 06/2009. rec levaquin x 10  May 26, 2010 6 wk followup.  Pt c/o cough since this am- prod with light green/yellow sputum.  Breathing is the same- no better or worse.  pred down to 5  mg daily and using albuterol three times a day as baseline.   August 24, 2010 ov cough worse with greenish yellow suputm.  doesn't think levaquin working as well, rec cipro  see page 2   November 22, 2010 ov cough worse has not filled cipro yet.  no increase sob. self titrating prednisone from 40 down to floor of 5 mg and on 20 mg per day.  Pt denies any significant sore throat, dysphagia, itching, sneezing,  nasal congestion or excess secretions,  fever, chills, sweats, unintended wt loss, pleuritic or exertional cp, hempoptysis, change in activity tolerance  orthopnea pnd or leg swelling.  Pt also denies any obvious fluctuation in symptoms with weather or environmental change or other alleviating or aggravating factors.         Current Medications (verified): 1)  Multivitamins   Caps (Multiple Vitamin) .... Take 1 Tablet By Mouth Once A Day 2)  Vivelle-Dot 0.025 Mg/24hr  Pttw (Estradiol) .... Use On Tuesday and Saturday 3)  Levothyroxine Sodium 100 Mcg Tabs (Levothyroxine Sodium) .Marland Kitchen.. 1 By Mouth Once Daily 4)  Citrucel  Powd (Methylcellulose (Laxative)) .Marland Kitchen.. 1 Tsp By Mouth Once Daily 5)  Nasonex 50 Mcg/act Susp (Mometasone Furoate) .Marland Kitchen.. 1-2 Puffs Two Times A Day 6)  Xalatan 0.005 %  Soln (Latanoprost) .... Use 1 Drop in Both Eyes At Bedtime 7)  Qvar 80 Mcg/act Aers (Beclomethasone Dipropionate) .... Inhale 2 Puffs Two Times A Day 8)  Plavix 75 Mg  Tabs (Clopidogrel Bisulfate) .... Take 1 Tablet By Mouth Once A Day 9)  Prednisone 10 Mg  Tabs (Prednisone) .... Take 1/2 Tablet Once Daily 10)  Klor-Con 20 Meq  Pack (Potassium Chloride) .... Use Two Times A Day 11)  Zyrtec Allergy 10 Mg  Tabs (Cetirizine Hcl) .... Take 1 Tablet By Mouth Once A Day 12)  Bisoprolol Fumarate 5 Mg  Tabs (Bisoprolol Fumarate) .... 1/2 Once Daily 13)  Nisoldipine 20 Mg Xr24h-Tab (Nisoldipine) .... Take 1 Tablet By Mouth Once A Day 14)  Hyzaar 100-25 Mg  Tabs (Losartan Potassium-Hctz) .... Take 1/2  Tablet By Mouth  Once A Day 15)  Prometrium 200 Mg Caps (Progesterone Micronized) .... Take 1 Capsule By Mouth At Bedtime 16)  Prevacid 15 Mg  Cpdr (Lansoprazole) .... Two Times A Day 17)  Calcium 500/d 500-400 Mg-Unit  Chew (Calcium-Vitamin D) .... Two Times A Day 18)  Famotidine 20 Mg Tabs (Famotidine) .... Take 1 Tab By Mouth At Bedtime 19)  Prednisone 10 Mg Tabs (Prednisone) .... 4 Tabs Till Better, Then 3 Tabs X3days, 2 Tabs X3days, 1 Tab X3days and 1/2 Daily 20)  Mucinex D 60-600 Mg  Tb12 (Pseudoephedrine-Guaifenesin) .... Take 1 To 2 Tablets Every 12 Hours As Needed 21)  Alprazolam 0.25 Mg  Tabs (Alprazolam) .... Take 1 Tablet Every 6 Hours As Needed. 22)  Albuterol Sulfate (2.5 Mg/30ml) 0.083%  Nebu (Albuterol Sulfate) .Marland Kitchen.. 1 Every 3 Hours As Needed 23)  Meloxicam 7.5 Mg Tabs (Meloxicam) .... One Twice Daily With Meals As Needed For Joint Pain (Hurts To Move) 24)  Afrin Saline Nasal Mist 0.65 %  Soln (Saline) .Marland Kitchen.. 1 Puff Each Nostril Two Times A Day For 5 Days As Needed 25)  Reclast 5 Mg/114ml Soln (Zoledronic Acid) .... Infusion Yearly in January 26)  Cipro 750 Mg  Tabs (Ciprofloxacin Hcl) .... One By Mouth Twice Daily  Allergies (verified): 1)  ! Iodine 2)  Sulfamethoxazole (Sulfamethoxazole) 3)  Erythromycin Ethylsuccinate  Past History:  Past Medical History: HYPOTHYROIDISM (ICD-244.9) BRONCHIECTASIS W/ACUTE EXACERBATION (ICD-494.1)   - Xolair attempted 6/04 -> 9/05   - Immunotherapy stopped 8/09   - Started xyflo 8/09 > 9/09 no benefit   - VEST 2008 no benefit   - Flutter valve helpful March 17, 2009    - Allergy profile  April 12, 2010 >>   IgE 30 unremarkable   - Dulera 200 added November 22, 2010  HYPERLIPIDEMIA (ICD-272.4)   - Target LDL < 70 due to prev TIA's TRANSIENT ISCHEMIC ATTACKS, HX OF (ICD-V12.50).............................................Marland KitchenReynolds OSTEOPOROSIS (ICD-733.00)     - Reclast January 2009, 2010, October 26, 2009     - Bone Density 08/05/09  Spine  0.1    Left  -2.2,  Right -1.6  Health Maintenance...................................................................................................Marland KitchenWert   -Td 3/05   -Pneumovax 11/04  and 9/09     -Med calendar 12/24/08   -CPX  January 11, 2010    -GYN health maint...........................................................................Marland KitchenDickstein Asthma Diverticulosis    - Colonoscopy 01/01/09.....................................................................Marland KitchenRussella Dar GERD Irritable Bowel Syndrome Hemorrhoids R Shoulder Pain..................................................................................Marland KitchenKendal/ Olin Hypothyroid  Vital Signs:  Patient profile:   77 year old female Weight:      120 pounds O2 Sat:      90 % on Room air Temp:     98.1 degrees F oral Pulse rate:   89 / minute BP sitting:   124 / 63  (left  arm)  Vitals Entered By: Vernie Murders (November 22, 2010 11:06 AM)  O2 Flow:  Room air  Physical Exam  Additional Exam:  wt 117 August 24, 2009> 112 May 26, 2010 > 117 August 24, 2010 > 120 November 22, 2010  amb wf nad HEENT: nl dentition, turbinates, and orophanx. Nl external ear canals without cough reflex NECK :  without JVD/Nodes/TM/ nl carotid upstrokes bilaterally LUNGS: no acc muscle use, pan exp sonorous rhonchi CV:  RRR  no s3 or murmur or increase in P2, no edema  ABD:  soft and nontender with nl excursion in the supine position. No bruits or organomegaly, bowel sounds nl MS:  warm without deformities, calf tenderness, cyanosis or clubbing      Impression & Recommendations:  Problem # 1:  BRONCHIECTASIS (ICD-494.0)  Becoming refractory to rx, re try cipro in high doses x 10 cylces.  alreadyfailed  vest/ xolair  Challenge with dulera 200 2 puffs first thing  in am and 2 puffs again in pm about 12 hours later in addition to qvar  I spent extra time with the patient today explaining optimal mdi  technique.  This improved from  75- 90% with  coaching.  Medications Added to Medication List This Visit: 1)  Dulera 200-5 Mcg/act Aero (Mometasone furo-formoterol fum) .... 2 puffs first thing  in am and 2 puffs again in pm about 12 hours later  Other Orders: Prescription Created Electronically [id number]) Est. Patient Level IV (60454)  Patient Instructions: 1)  CPX early May, call sooner if needed 2)  See calendar for specific medication instructions and bring it back for each and every office visit for every healthcare provider you see.  Without it,  you may not receive the best quality medical care that we feel you deserve.  3)  Try cipro 750 twice daily x 10 days  for worsening discoloration of sputum 4)  See calendar for specific medication instructions and bring it back for each and every office visit for every healthcare provider you see.  Without it,  you may not receive the best quality medical care that we feel you deserve.  5)  Return to office in 3 months, sooner if needed  Prescriptions: DULERA 200-5 MCG/ACT AERO (MOMETASONE FURO-FORMOTEROL FUM) 2 puffs first thing  in am and 2 puffs again in pm about 12 hours later  #1 x 11   Entered and Authorized by:   Nyoka Cowden MD   Signed by:   Nyoka Cowden MD on 11/22/2010   Method used:   Print then Give to Patient   RxID:   [id number] CIPRO 750 MG  TABS (CIPROFLOXACIN HCL) One by mouth twice daily  #20 x 11   Entered and Authorized by:   Nyoka Cowden MD   Signed by:   Nyoka Cowden MD on 11/22/2010   Method used:   Electronically to        [id number]       [address]       [place], [place]  [address]       Ph: [phone] or [phone]       Fax: [phone]   RxID:   [id number]

## 2010-12-14 ENCOUNTER — Ambulatory Visit (HOSPITAL_COMMUNITY): Payer: Medicare Other | Attending: Internal Medicine

## 2010-12-14 DIAGNOSIS — M810 Age-related osteoporosis without current pathological fracture: Secondary | ICD-10-CM | POA: Insufficient documentation

## 2011-01-11 ENCOUNTER — Other Ambulatory Visit: Payer: Self-pay | Admitting: Internal Medicine

## 2011-01-12 ENCOUNTER — Emergency Department (HOSPITAL_COMMUNITY): Payer: Medicare Other

## 2011-01-12 ENCOUNTER — Observation Stay (HOSPITAL_COMMUNITY)
Admission: EM | Admit: 2011-01-12 | Discharge: 2011-01-16 | Disposition: A | Discharge status: Home / Self Care | Payer: Medicare Other | Attending: Internal Medicine | Admitting: Internal Medicine

## 2011-01-12 ENCOUNTER — Other Ambulatory Visit: Payer: Self-pay | Admitting: *Deleted

## 2011-01-12 DIAGNOSIS — I10 Essential (primary) hypertension: Secondary | ICD-10-CM | POA: Insufficient documentation

## 2011-01-12 DIAGNOSIS — I951 Orthostatic hypotension: Principal | ICD-10-CM | POA: Insufficient documentation

## 2011-01-12 DIAGNOSIS — R51 Headache: Secondary | ICD-10-CM | POA: Insufficient documentation

## 2011-01-12 DIAGNOSIS — E785 Hyperlipidemia, unspecified: Secondary | ICD-10-CM | POA: Insufficient documentation

## 2011-01-12 DIAGNOSIS — R55 Syncope and collapse: Secondary | ICD-10-CM | POA: Insufficient documentation

## 2011-01-12 DIAGNOSIS — Z79899 Other long term (current) drug therapy: Secondary | ICD-10-CM | POA: Insufficient documentation

## 2011-01-12 DIAGNOSIS — J479 Bronchiectasis, uncomplicated: Secondary | ICD-10-CM | POA: Insufficient documentation

## 2011-01-12 DIAGNOSIS — Z8673 Personal history of transient ischemic attack (TIA), and cerebral infarction without residual deficits: Secondary | ICD-10-CM | POA: Insufficient documentation

## 2011-01-12 DIAGNOSIS — R21 Rash and other nonspecific skin eruption: Secondary | ICD-10-CM | POA: Insufficient documentation

## 2011-01-12 DIAGNOSIS — J329 Chronic sinusitis, unspecified: Secondary | ICD-10-CM | POA: Insufficient documentation

## 2011-01-12 DIAGNOSIS — IMO0002 Reserved for concepts with insufficient information to code with codable children: Secondary | ICD-10-CM | POA: Insufficient documentation

## 2011-01-12 DIAGNOSIS — S82843A Displaced bimalleolar fracture of unspecified lower leg, initial encounter for closed fracture: Secondary | ICD-10-CM | POA: Insufficient documentation

## 2011-01-12 DIAGNOSIS — A4902 Methicillin resistant Staphylococcus aureus infection, unspecified site: Secondary | ICD-10-CM | POA: Insufficient documentation

## 2011-01-12 DIAGNOSIS — M25579 Pain in unspecified ankle and joints of unspecified foot: Secondary | ICD-10-CM | POA: Insufficient documentation

## 2011-01-12 DIAGNOSIS — R112 Nausea with vomiting, unspecified: Secondary | ICD-10-CM | POA: Insufficient documentation

## 2011-01-12 DIAGNOSIS — L02419 Cutaneous abscess of limb, unspecified: Secondary | ICD-10-CM | POA: Insufficient documentation

## 2011-01-12 DIAGNOSIS — X58XXXA Exposure to other specified factors, initial encounter: Secondary | ICD-10-CM | POA: Insufficient documentation

## 2011-01-12 DIAGNOSIS — E039 Hypothyroidism, unspecified: Secondary | ICD-10-CM | POA: Insufficient documentation

## 2011-01-12 DIAGNOSIS — M810 Age-related osteoporosis without current pathological fracture: Secondary | ICD-10-CM | POA: Insufficient documentation

## 2011-01-12 DIAGNOSIS — E869 Volume depletion, unspecified: Secondary | ICD-10-CM | POA: Insufficient documentation

## 2011-01-12 LAB — POCT I-STAT, CHEM 8
BUN: 17 mg/dL (ref 6–23)
Creatinine, Ser: 1.3 mg/dL — ABNORMAL HIGH (ref 0.4–1.2)
Glucose, Bld: 110 mg/dL — ABNORMAL HIGH (ref 70–99)
Hemoglobin: 12.9 g/dL (ref 12.0–15.0)
Potassium: 3.8 mEq/L (ref 3.5–5.1)

## 2011-01-12 LAB — URINALYSIS, ROUTINE W REFLEX MICROSCOPIC
Bilirubin Urine: NEGATIVE
Ketones, ur: NEGATIVE mg/dL
Nitrite: NEGATIVE
Protein, ur: NEGATIVE mg/dL
Urobilinogen, UA: 0.2 mg/dL (ref 0.0–1.0)
pH: 7 (ref 5.0–8.0)

## 2011-01-12 LAB — CBC
MCH: 27.3 pg (ref 26.0–34.0)
MCHC: 32.8 g/dL (ref 30.0–36.0)
Platelets: 293 10*3/uL (ref 150–400)
RDW: 14 % (ref 11.5–15.5)

## 2011-01-12 LAB — PROTIME-INR
INR: 1.04 (ref 0.00–1.49)
Prothrombin Time: 13.8 seconds (ref 11.6–15.2)

## 2011-01-12 LAB — CARDIAC PANEL(CRET KIN+CKTOT+MB+TROPI)
Total CK: 287 U/L — ABNORMAL HIGH (ref 7–177)
Troponin I: 0.01 ng/mL (ref 0.00–0.06)

## 2011-01-12 LAB — POCT CARDIAC MARKERS
CKMB, poc: 1.3 ng/mL (ref 1.0–8.0)
Myoglobin, poc: 239 ng/mL (ref 12–200)

## 2011-01-12 MED ORDER — ALPRAZOLAM 0.25 MG PO TABS: 0.2500 mg | ORAL_TABLET | Freq: Four times a day (QID) | ORAL | Status: DC | PRN

## 2011-01-12 NOTE — Telephone Encounter (Signed)
Faxed refill request to listed pharmacy.

## 2011-01-13 LAB — URINE CULTURE: Culture  Setup Time: 201204182224

## 2011-01-13 LAB — CBC
HCT: 35.7 % — ABNORMAL LOW (ref 36.0–46.0)
MCH: 26.9 pg (ref 26.0–34.0)
MCV: 83.4 fL (ref 78.0–100.0)
Platelets: 258 10*3/uL (ref 150–400)
RDW: 14 % (ref 11.5–15.5)
WBC: 8.4 10*3/uL (ref 4.0–10.5)

## 2011-01-13 LAB — CARDIAC PANEL(CRET KIN+CKTOT+MB+TROPI)
CK, MB: 2.9 ng/mL (ref 0.3–4.0)
CK, MB: 2.6 ng/mL (ref 0.3–4.0)
Relative Index: 1.7 (ref 0.0–2.5)
Total CK: 203 U/L — ABNORMAL HIGH (ref 7–177)
Troponin I: 0.01 ng/mL (ref 0.00–0.06)

## 2011-01-13 LAB — COMPREHENSIVE METABOLIC PANEL
Albumin: 2.7 g/dL — ABNORMAL LOW (ref 3.5–5.2)
Alkaline Phosphatase: 45 U/L (ref 39–117)
BUN: 10 mg/dL (ref 6–23)
Creatinine, Ser: 0.95 mg/dL (ref 0.4–1.2)
Glucose, Bld: 127 mg/dL — ABNORMAL HIGH (ref 70–99)
Potassium: 3.8 mEq/L (ref 3.5–5.1)
Total Bilirubin: 0.6 mg/dL (ref 0.3–1.2)
Total Protein: 5.6 g/dL — ABNORMAL LOW (ref 6.0–8.3)

## 2011-01-13 LAB — MAGNESIUM: Magnesium: 2.1 mg/dL (ref 1.5–2.5)

## 2011-01-14 LAB — BASIC METABOLIC PANEL
BUN: 6 mg/dL (ref 6–23)
Chloride: 108 mEq/L (ref 96–112)
Creatinine, Ser: 0.85 mg/dL (ref 0.4–1.2)
Glucose, Bld: 111 mg/dL — ABNORMAL HIGH (ref 70–99)
Potassium: 4 mEq/L (ref 3.5–5.1)

## 2011-01-17 ENCOUNTER — Encounter (HOSPITAL_BASED_OUTPATIENT_CLINIC_OR_DEPARTMENT_OTHER): Payer: Medicare Other

## 2011-01-18 ENCOUNTER — Encounter: Payer: Self-pay | Admitting: Internal Medicine

## 2011-01-18 ENCOUNTER — Ambulatory Visit (INDEPENDENT_AMBULATORY_CARE_PROVIDER_SITE_OTHER): Payer: Medicare Other | Admitting: Internal Medicine

## 2011-01-18 VITALS — BP 102/60 | HR 85 | Temp 98.3°F

## 2011-01-18 DIAGNOSIS — I10 Essential (primary) hypertension: Secondary | ICD-10-CM

## 2011-01-18 DIAGNOSIS — J479 Bronchiectasis, uncomplicated: Secondary | ICD-10-CM

## 2011-01-18 NOTE — Progress Notes (Signed)
Subjective:    Patient ID: [name], female    DOB: [birth year], 77 y.o.   MRN: [id number]  HPI  84 yowf never smoker with documented chronic sinusitis /bronchiectasis and a tendency to recurrent purulent exacerbations, has been self titrating prednisone with a ceiling of 40 mg per day and a floor of 5 extensively evaluated at [facility] and by [name] with neg w/u x atopic.     October 26, 2009--Presents for follow up and discuss Reclast. With  osteopenia w/ last BMD 11/10 w/ Tscore of -2.2. intolerant to Bisphosphnates, and has received Reclast x 2 infusions with no known difficulties. She is high risk for fx d/t chronic steroids. .     April 12, 2010 3 month followup. Pt c/o rhinitis and "feeling bad" x 2 wks. She also c/o HA and facial pressure on and off x 2 wks. She states she thinks her TSH is high- having wt gain and dry skin. tapered pred down to 5 mg daily. avelox no longer effective for purulent sputum, wants to try levaquin again as worked when got i from [employer] 06/2009. rec levaquin x 10      August 24, 2010 ov cough worse with greenish yellow suputm. doesn't think levaquin working as well, rec cipro > ok but not great results   November 22, 2010 ov cough worse has not filled cipro yet. no increase sob. self titrating prednisone from 40 down to floor of 5 mg and on 20 mg per day.  01/18/2011 ov post hosp for mrsa infection and L ankle followed by Ranell Patrick.  No change cough or sob.  No change chronic purulent sputum.  Pt denies any significant sore throat, dysphagia, itching, sneezing,  nasal congestion or excess/ purulent secretions,  fever, chills, sweats, unintended wt loss, pleuritic or exertional cp, hempoptysis, orthopnea pnd or leg swelling.    Also denies any obvious fluctuation of symptoms with weather or environmental changes or other aggravating or alleviating factors.        Allergies  1) ! Iodine  2) Sulfamethoxazole (Sulfamethoxazole)  3)  Erythromycin Ethylsuccinate   Past Medical History:  HYPOTHYROIDISM (ICD-244.9)  BRONCHIECTASIS W/ACUTE EXACERBATION (ICD-494.1)  - Xolair attempted 6/04 -> 9/05  - Immunotherapy stopped 8/09  - Started xyflo 8/09 > 9/09 no benefit  - VEST 2008 no benefit  - Flutter valve helpful March 17, 2009  - Allergy profile April 12, 2010 >> IgE 30 unremarkable  - Dulera 200 added November 22, 2010  HYPERLIPIDEMIA (ICD-272.4)  - Target LDL < 70 due to prev TIA's  TRANSIENT ISCHEMIC ATTACKS, HX OF (ICD-V12.50).............................................Marland KitchenReynolds  OSTEOPOROSIS (ICD-733.00)  - Reclast January 2009, 2010, October 26, 2009  - Bone Density 08/05/09 Spine 0.1 Left -2.2, Right -1.6  Health Maintenance...................................................................................................Marland KitchenWert  -Td 3/05  -Pneumovax 11/04 and 9/09  -Med calendar 12/24/08  -CPX January 11, 2010  -GYN health maint...........................................................................Marland KitchenDickstein  Asthma  Diverticulosis  - Colonoscopy 01/01/09.....................................................................Marland KitchenRussella Dar  GERD  Irritable Bowel Syndrome  Hemorrhoids  R Shoulder Pain..................................................................................[name]  Hypothyroid     Review of Systems     Objective:   Physical Exam    wt 117 August 24, 2009> 112 May 26, 2010 > 117 August 24, 2010 > 120 November 22, 2010  W/c bound wf with cast on L ankle  HEENT: nl dentition, turbinates, and orophanx. Nl external ear canals without cough reflex  NECK : without JVD/Nodes/TM/ nl carotid upstrokes bilaterally  LUNGS: no acc muscle use, pan exp sonorous rhonchi  CV:  RRR no s3 or murmur or increase in P2, no edema  ABD: soft and nontender with nl excursion in the supine position. No bruits or organomegaly, bowel sounds nl  MS: warm without  , calf tenderness, cyanosis or clubbing       Assessment & Plan:

## 2011-01-18 NOTE — Patient Instructions (Addendum)
See [name] w/in 2 weeks with all your medications, even over the counter meds, separated in two separate bags, the ones you take no matter what vs the ones you stop once you feel better and take only as needed when you feel you need them.   [name]  will generate for you a new user friendly medication calendar that will put [place] all on the same page re: your medication use.     Without this process, it simply isn't possible to assure that we are providing  your outpatient care  with  the attention to detail we feel you deserve.   If we cannot assure that you're getting that kind of care,  then we cannot manage your problem effectively from this clinic.  Once you have seen [name] and we are sure that we're all on the same page with your medication use she will arrange follow up with me.   Leave off losartan and continue 02 2lpm  Eat lots of yogurt while on clindamycin and if diarrhea gets bad stop it and call me

## 2011-01-19 ENCOUNTER — Telehealth: Payer: Self-pay | Admitting: Internal Medicine

## 2011-01-19 NOTE — Assessment & Plan Note (Signed)
   Each maintenance medication was reviewed in detail including most importantly the difference between maintenance and as needed and under what circumstances the prns are to be used.  Please see instructions for details which were reviewed in writing and the patient given a copy.    This was done in the context of a medication calendar review which provided the patient with a user-friendly unambiguous mechanism for medication administration and reconciliation and provides an action plan for all active problems. It is critical that this be shown to every doctor  for modification during the office visit if necessary so the patient can use it as a working document.  

## 2011-01-19 NOTE — Telephone Encounter (Signed)
Spoke with [place] and gave verbal okay for pt to have bedside commode.

## 2011-01-19 NOTE — Assessment & Plan Note (Signed)
Try off arb due to persistently low bp

## 2011-01-20 NOTE — H&P (Signed)
NAMEJAEDYNN, BOHLKEN               ACCOUNT NO.:  [id number]  MEDICAL RECORD NO.:  [id number]           PATIENT TYPE:  E  LOCATION:  [facility]LED                         FACILITY:  [facility]  PHYSICIAN:  Lonia Blood, M.D.DATE OF BIRTH:  [birth year]  DATE OF ADMISSION:  01/12/2011 DATE OF DISCHARGE:                             HISTORY & PHYSICAL   PRIMARY CARE PHYSICIAN:  [name], MD, with [facility].  CHIEF COMPLAINT:  Syncope.  PRESENT ILLNESS:  [name] is a very pleasant, active 77 year old female who lives in the [place] area with her 10+-year-old mother.  She suffered a wound to her left anterior tibial area approximately week and half ago.  A number of days after, she began to experience severe pain in the area and noticed that it was not healing well.  She therefore presented to an Urgent Care Center and has received ongoing care there.  Ultimately her care at the Urgent Care Center resulted in a diagnosis of MRSA cellulitis associated with the cutaneous wound to the left anterior tibial area.  These culture results apparently returned on late Monday of this week and the patient was provided with a prescription of Septra which she began on Tuesday of this week.  It is presently Wednesday.  After her first dose of Septra, the patient promptly developed a diffuse sand-papery red rash.  She also developed severe nausea and vomited a number of times.  She wisely discontinued any further use of Septra.  She admits to not eating or drinking much for approximately 24 hours as a result of her reaction to the Septra and possibly also related to her MRSA cellulitis.  This morning, she awoke as per usual routine.  She promptly arose from her bed and walked into her dressing room where she sat up on a high stool to begin her morning makeup routine.  The next thing she knows that she was waking up on the floor of the dressing room with severe pain  in her ankle and mild pain in her head.  She states that she has passed out a few times in her life but not recently.  She states that she previously recalls having warning symptoms such as a buzzing in her ear prior to these remote spells of syncope.  There was no warning at all this time. The patient had no associated shortness of breath, diaphoresis or chest pain.  The patient was completely alert and aware of her surroundings upon waking.  There was no one there to witness her episode.  For concern for a syncopal spell, she presented to the emergency room for further evaluation.  In the emergency room, the patient was found to be hyponatremic with a sodium of 127.  She was also clinically significantly dehydrated.  There is evidence of a cellulitis affecting a superficial skin wound/tear of the left anterior tibial area.  Also, unbeknownst to the emergency room physician who ordered the x-ray, ankle x-rays have confirmed bimalleolar ankle fractures.  REVIEW OF SYSTEMS:  A comprehensive review of systems including 11- system review was unremarkable with the exception  to the positive elements noted in the history of present illness above.  PAST MEDICAL HISTORY: 1. Chronic bronchiectasis with chronic sinusitis.     a.     (a) Prednisone dependent with prior history of adrenal      insufficiency during episode of acute illness.     b.     (b) Cyclic antibiotics, presently not on an antibiotic      course.     c.     (c) Previously evaluated in the Allergy Clinic at [facility]     d.     (d) No recent acute exacerbations. 2. History of small-bowel obstruction with exploratory laparotomy     2009. 3. Hypothyroidism. 4. Hyperlipidemia. 5. History of TIA, on chronic Plavix therapy. 6. Osteoporosis with reported history of intolerance to     bisphosphonates.  OUTPATIENT MEDICATIONS: 1. Zyrtec. 2. Multivitamin. 3. Bisoprolol. 4. Vivelle-Dot. 5. Nisoldipine. 6.  Synthroid. 7. Hyzaar 8. Nasonex. 9. Prometrium. 10.Xalatan. 11.Prevacid. 12.Qvar. 13.Calcium plus D. 14.Plavix. 15.Pepcid. 16.Prednisone 5 mg p.o. daily. 17.Xanax 0.25 mg q.6 h p.r.n. 18.Potassium chloride. 19.Albuterol nebulizer.  ALLERGIES:  CODEINE, ERYTHROMYCIN, and SULFA.  FAMILY HISTORY:  The patient's father died from an MI.  The patient has a sister with hypertension.  She has multiple aunts on each side of her family with multiple myeloma.  SOCIAL HISTORY:  The patient does not smoke tobacco and never has.  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  She does not drink alcohol.  She lives in the [place] area and lives along with her 74+- year-old mother in a one-level condo on the ground.  DATA REVIEW:  Sodium 127 which is low.  Potassium, chloride, bicarb and BUN are normal.  Creatinine is elevated at 1.3.  Serum glucose is mildly elevated at 110.  Myoglobin is elevated at 239, INR is 1.04.  Urinalysis is unrevealing.  12-lead EKG reveals normal sinus rhythm at 71 beats per minute.  CT scan of the head reveals no acute disease with chronic small- vessel disease and chronic paranasal sinus disease.  Left ankle x-rays, as noted above, revealed bimalleolar fractures.  PHYSICAL EXAMINATION:  VITAL SIGNS:  Temperature 97.4, blood pressure 115/64, heart rate 71, respiratory rate 20, oxygen saturation 100% on 2 L per minute nasal cannula.  GENERAL:  Well-developed, well-nourished female in no acute respiratory distress.  HEENT:  Normocephalic, atraumatic.  Pupils equal, round, react to light and accommodation. Extraocular muscles intact bilaterally.  OC and OP, clear.  NECK:  There is no JVD.  LUNGS:  Very faint crackles throughout all fields with no wheezing and good air movement throughout.  CARDIOVASCULAR:  Regular rate and rhythm without murmur, gallop or rub.  Normal S1, S2.  ABDOMEN: Nontender, nondistended, soft.  Bowel sounds present.  No  organomegaly, no rebound or ascites.  EXTREMITIES:  No significant cyanosis or clubbing bilateral lower extremities.  Cutaneous:  The patient has an 1 inch x approximately 1 inch injury which appears to be consistent with a deep skin tear on the left anterior tibial region.  There is some surrounding erythema.  The patient reports that cultures obtained at [facility] were positive for MRSA.  MUSCULOSKELETAL:  The patient's left ankle is quite swollen.  There is a significant amount of ecchymosis about the left medial malleolus.  There is 1+ dorsalis pedis pulse appreciable in the same foot.  IMPRESSION AND PLAN: 1. Syncope:  The patient's syncope appears to be  most consistent with     an orthostatic hypotension.  I suspect the patient has become     dehydrated due to her vomiting, decreased p.o. intake, and     potentially increased insensible loss due to low-grade fever in the     setting of Methicillin-resistant Staphylococcus aureus cellulitis.     We will appropriately hydrate her using isotonic crystalloid saline     solution.  We will follow her blood pressure closely.  We will hold     her diuretic for now.  We will cycle cardiac enzymes to ensure that     there is no silent myocardial ischemia but I doubt this     significantly.  We will also follow the patient's telemetry to     ensure there is no evidence of arrhythmia which could be blamed for     this. 2. Hyponatremia:  This most likely represents a hypovolemic     hyponatremia.  This could also be an indication of mild adrenal     insufficiency.  My suspicion is this is primarily a volume-related     issue in the setting of diuretic dosing.  We will hold the     patient's hydrochlorothiazide.  We will gently hydrate her as noted     above.  We will follow up a sodium level in the morning. 3. Chronic prednisone use:  As noted above, the patient has a history     of adrenal insufficiency during times of acute  stress.  She is not     markedly tachycardic and not severely hypotensive.  Though her     sodium level is low, her potassium is borderline.  I do not suspect     a full-blown adrenal crisis at the present time.  I will     nonetheless gently increase her prednisone to 10 mg b.i.d. for 2     days and then down to 10 mg a day for a possible 3 to 4 days and     then back to her usual 5 mg a day.  If evidence of hemodynamic     instability is appreciated, we may need to consider full-dose IV     hydrocortisone. 4. Methicillin-resistant Staphylococcus aureus cellulitis:  I am not     privy to the culture results obtained at the [facility].  The patient's wound is consistent with a mild     superinfection.  There is no evidence on my exam at this time of     deep abscess formation or severe spreading cellulitis.  As a     result, I feel that p.o. antibiotics are reasonable.  Clearly the     patient has had a reaction to her __________.  I will give her a     trial of clindamycin orally and we will follow her clinically.     Alternatively, if she does not tolerate clindamycin, or if the     wound does not improve, we could consider changing to doxycycline. 5. Bimalleolar left ankle fracture:  The ER has contacted Orthopedics     as the patient was turned over to my care before these results were     available.  I have discussed the findings with the patient.  She     specifically requested [facility] attend to this issue     for her.  I have contacted [facility] and Dr. Brett Canales  Ranell Patrick has kindly agreed to visit the patient in the emergency room     tonight. 6. Hypothyroidism:  We will continue the patient's chronic prednisone     therapy. 7. Bronchiectasis:  We will continue the patient's home regimen.  At     the present time there is no evidence of an acute flare.  We will     consult Dr. [name] as needed if an acute flare is  encountered.     Lonia Blood, M.D.     JTM/MEDQ  D:  01/12/2011  T:  01/12/2011  Job:  [id number]  cc:   Casimiro Needle B. [name], MD, FCCP [address] [place] [address]  Electronically Signed by Jetty Duhamel M.D. on 01/20/2011 08:44:44 AM

## 2011-02-01 ENCOUNTER — Encounter: Payer: Medicare Other | Admitting: Adult Health

## 2011-02-01 ENCOUNTER — Other Ambulatory Visit: Payer: Self-pay | Admitting: Internal Medicine

## 2011-02-02 ENCOUNTER — Telehealth: Payer: Self-pay | Admitting: Internal Medicine

## 2011-02-02 ENCOUNTER — Encounter: Payer: Medicare Other | Admitting: Internal Medicine

## 2011-02-02 MED ORDER — NISOLDIPINE ER 20 MG PO TB24: 20.0000 mg | ORAL_TABLET | Freq: Every day | ORAL | Status: DC

## 2011-02-02 NOTE — Telephone Encounter (Signed)
Rx refill for sular sent to pharm and spoke with pt and notified that this was done.

## 2011-02-02 NOTE — Telephone Encounter (Signed)
No medication was indicated in this refill request.  Therefore will sign off.

## 2011-02-07 NOTE — Discharge Summary (Signed)
[name], [name]               ACCOUNT NO.:  [id number]  MEDICAL RECORD NO.:  [id number]           PATIENT TYPE:  O  LOCATION:  1429                         FACILITY:  [facility]  PHYSICIAN:  Kela Millin, M.D.DATE OF BIRTH:  [birth year]  DATE OF ADMISSION:  01/12/2011 DATE OF DISCHARGE:  01/16/2011                        DISCHARGE SUMMARY - REFERRING   DISCHARGE DIAGNOSES: 1. Orthostatic hypotension. 2. Syncope - secondary to orthostatic hypotension. 3. Left leg methicillin-resistant Staphylococcus aureus - per cultures     done outpatient per Dr. Yetta Barre at Urgent Care. 4. Left bimalleolar fracture - nonsurgical management per Dr. Ranell Patrick,     follow up in his office this next week. 5. History of transient ischemic attack. 6. History of chronic bronchiectasis with chronic sinusitis - on     chronic prednisone. 7. Hypothyroidism. 8. Hyperlipidemia. 9. History of transient ischemic attack. 10.History of osteoporosis. 11.History of small bowel obstruction and status post exploratory     laparotomy in the past.  PROCEDURES AND STUDIES: 1. Chest x-ray on January 12, 2011 - prominent markings remained in the     lung bases consistent with scarring.  Superimposed pneumonia     particularly in the right mid lung cannot be excluded. 2. CT scan of head on January 12, 2011 - no acute intracranial     abnormality.  Chronic small vessel ischemia affecting the cerebral     white matter and deep gray matter with progression since 2004.     Chronic paranasal sinusitis. 3. Right foot x-ray on January 12, 2011 - possible small avulsion     fracture from the dorsal aspect of the [facility] head.  Old healed     fracture of the second metatarsal shaft. 4. Left ankle x-ray on January 12, 2011 - bimalleolar left ankle     fracture as described with associated soft tissue swelling.  No     significant widening of the ankle mortise.  CONSULTATIONS:  Orthopedics - Dr. Ranell Patrick.  BRIEF HISTORY:  The patient  is a pleasant 77 year old white female with the above-listed medical problems, who presented following a syncopal episode.  She reported that she had suffered a wound to her left anterior tibial area about a week and half ago and a few days after she began experiencing severe pain in that area noticed that it was not healing well.  She went to Urgent Care and was initially was placed on local wound care, but subsequently on follow up visit, a swab was done and the cultures from that came back positive for MRSA.  She was started on Septra.  Following that she developed a sandpaper rash along with nausea, vomiting number of times and so she stopped using the Septra. She stated that she was not eating or drinking much for about 24 hours because of the reaction she had and also possibly related to the MRSA cellulitis.  On the day of admission, she reported that after she woke up she walked to her dressing room and sat on a high stool and began make up routine for the morning, but the next thing she knew she was waking up  on the floor of her dressing room with severe pain in her ankle and mild pain in her head.  She reported that she had passed out before but not recently.  She denied shortness of breath, chest pain or diaphoresis.  She was seen in the ED and x-ray of her lower extremity was done and the results as stated above.  She was admitted for further evaluation and management.  HOSPITAL COURSE: 1. Orthostatic syncope - the patient had orthostatic vital signs done     following her admission (lying to sitting as the patient was unable     to stand secondary to the ankle fracture), and she was orthostatic.     She was hydrated with IV fluids and on follow up recheck,     orthostasis has resolved.  This was the likely cause of her     syncopal episode as noted.  She had had nausea or vomiting and     decreased p.o. intake and also was on Hyzaar, which includes a     diuretic.  The  Hyzaar was discontinued in the hospital and she has     been instructed to continue to hold off the Hyzaar, blood pressures     have been adequately controlled on her other antihypertensives. 2. Syncope - as above, upon admission cardiac enzymes were done and     came back negative.  Thus the impression was secondary to     orthostatic syncope. 3. Left bimalleolar fracture - Orthopedics was consulted upon     admission and Dr. Ranell Patrick saw the patient and recommended     nonsurgical management.  The left ankle was placed in a splint and     Ace wrapped.  Dr. Ranell Patrick recommended non-weightbearing for that     left leg and weightbearing as tolerated on the right.  PT/OT was     consulted and they saw the patient and recommended skilled nursing,     but the patient has declined skilled nursing.  She is alert and     lucid and states that she has family that is going to be there at     home to help her and she also states that she will make     arrangements for other in home care.  Case management to assist     with sitting the patient up with home health PT/OT and aide and     also with a rolling walker and a wheelchair.  She is to follow up     with Dr. Ranell Patrick next week as per ortho recommendations and also     with her primary care physician in 1 to 2 weeks. 4. Left leg methicillin-resistant Staphylococcus aureus - as discussed     above.  The patient had an adverse reaction to the Bactrim she had     been started on and so upon admission, she was placed on     clindamycin which she has tolerated well.  She has remained     afebrile with no leukocytosis.  She is to continue the clindamycin     upon discharge and follow up with her primary care physician. 5. Volume depletion - she was hydrated with IV fluids during this     hospital stay. 6. Bronchiectasis - she was maintained on her outpatient medications     during this hospital stay.  It was noted that she was on chronic     prednisone  and she received increased  doses of the prednisone while     in the hospital and will continue her outpatient dose upon     discharge.  Her O2 sats on room air today ranging 87% to 88% and     she will be discharged on home O2 and is to follow up with Dr.     [name]. 7. Hypertension - she was maintained on her outpatient medications     except for Hyzaar, which was held off because she was orthostatic.     Her blood pressures have been well controlled on the bisoprolol and     nisoldipine and she is to continue this upon discharge.  She has     been instructed to discontinue the Hyzaar as above, she is to     follow up with her PCP for continued monitoring of her blood     pressures and to consider restarting the Hyzaar if clinically     appropriate for optimal blood pressure control.  DISCHARGE MEDICATIONS: 1. Clindamycin 300 mg p.o. t.i.d. for 7 more days. 2. Guaifenesin 600 mg 1 p.o. b.i.d. 3. Oxycodone IR 5 mg 1 to 2 tablets q.4 h. P.r.n. 4. Albuterol nebs 2.5 t.i.d. p.r.n. 5. Bisoprolol 5 mg half a tablet q.a.m. 6. Calcium carbonate/vitamin D 1 tablet b.i.d. 7. Citrucel powder 1 teaspoon p.o. daily. 8. Famotidine 20 mg p.o. q.h.s. 9. Multivitamins 1 p.o. q.a.m. 10.Nasonex 1 to 2 puffs b.i.d. 11.Nisoldipine XR 20 one p.o. daily. 12.Plavix 75 mg 1 p.o. daily. 13.KCL 20 mEq 1 p.o. b.i.d. 14.Prednisone 10 mg half a tablet daily. 15.Prevacid 15 mg p.o. b.i.d. 16.Prometrium oral 200 mg 1 p.o. q.h.s. 17.QVAR 2 puffs b.i.d. 18.Synthroid 100 mcg 1 p.o. q.a.m. 19.Vivelle-Dot 0.025 mg 24 hours 1 patch on Tuesdays and Saturdays. 20.Xalatan 0.005% ophthalmic 1 drop in both eyes q.h.s. 21.Xanax 0.25 mg half a tablet q.6 h. p.r.n. 22.Zyrtec 10 mg p.o. q.a.m.  DISCONTINUED MEDICATIONS:  Hyzaar as above.  FOLLOWUP CARE: 1. Dr. [name] in 1 to 2 weeks. 2. Dr. Ranell Patrick next week.  The patient to call [phone] for     appointment.  SPECIAL INSTRUCTIONS:  No weightbearing to the left lower  extremity and weightbearing as tolerated to the right lower extremity.  DISCHARGE CONDITION:  Improved.  Stable.     Kela Millin, M.D.     ACV/MEDQ  D:  01/16/2011  T:  01/16/2011  Job:  [id number]  Electronically Signed by Donnalee Curry M.D. on 02/07/2011 10:53:23 AM

## 2011-02-08 NOTE — Assessment & Plan Note (Signed)
[facility]                             PULMONARY OFFICE NOTE   NAME:[place], [name]                      MRN:          [id number]  DATE:04/09/2007                            DOB:          1932-10-13    HISTORY OF PRESENT ILLNESS:  A 77 year old white female with multiple  complaints.  She continues to have a productive cough with discolored  sputum and is unimpressed that Cipro is changing the color of the sputum  at this point.  She denies any pleuritic pain, fever, chills, sweats,  orthopnea, PND or leg swelling.   She also complains of ankle and leg pain that is worse with walking.  She has not yet tried Advil.  She also is concerned about difficulty  hearing from her right ear.   MEDICATIONS:  For full list of medications which I have reviewed with  her in detail, please see face sheet dated April 09, 2007.   PHYSICAL EXAMINATION:  GENERAL:  She is a chronically ill white female  who actually appears younger than stated age.  VITAL SIGNS:  Stable.  APPEARANCE:  Minimally chronically appearing ill-appearing white female  with subtle cushingoid features who appears younger than stated age.  HEENT:  Unremarkable, oropharynx clear.  LUNGS:  Lung fields reveal junky inspiratory and expiratory rhonchi.  HEART:  Regular rhythm without murmur, gallop or rub.  ABDOMEN:  Soft, benign.  EXTREMITIES:  Warm without any calf tenderness, cyanosis, clubbing or  edema.   IMPRESSION:  1. Purulent tracheobronchitis.  Since she is failing to respond to      Cipro, I thought it would be appropriate to challenge her with      Levaquin 750 for 10 days to see to what extent this helps.  If not,      perhaps switching back to Augmentin would make more sense.  2. Symptoms suggesting tendinitis or arthritis in the right lower      extremity.  I have recommended Advil before she refers back to      orthopedics.  3. Retained wax in the right ear.  I would recommend  that she use the      over-the-counter medication and then return here for ear wax      removal or see her ear physician of record, Dr. Sharyn Lull.  4. Osteoporosis.  She is on Forteo.  She has completed pharmacologic      doses of vitamin D now so I am going to ask her to continue the      calcium with vitamin D daily regimen that she was on before and      return here in three months for repeat vitamin D levels.     [name]. [name], MD, [facility]  Electronically Signed    MBW/MedQ  DD: 04/09/2007  DT: 04/10/2007  Job #: [id number]

## 2011-02-08 NOTE — Assessment & Plan Note (Signed)
[facility]                             PULMONARY OFFICE NOTE   NAME:[name], [name] PLANT                      MRN:          [id number]  DATE:08/28/2007                            DOB:          Jun 25, 1933    HISTORY:  This is a delightful but chronically ill 77 year old white  female with chronic rhinitis and asthma as well as bronchiectasis.  She  has been extensively evaluated both at [facility] and by Dr.  Lucie Leather with no improvement on immunotherapy and remains steroid-  dependent at 10 mg tablets, 1/2 per day, with the option to go up to 40  mg per day for any exacerbations that occur.  Most recently she had  persistent purulent sputum with E coli and Pseudomonas cultured out of a  sputum specimen.  She improved symptomatically after a 10-day course of  Levaquin and has had no change in the quality of her sputum recently.   Her main complaint today is one of persistent headache.  She describes a  bifrontal headache that is in the center but radiates out and  typically is worse as the day goes on, not worse in the morning.  She  has tried Afrin with no benefit.  She tried increasing Nasonex which  made her nose bleed, so she stopped it.  For that, she has seen Dr.  Delton Coombes in the past and also the ENT doctors at [facility] but is not  inclined to see any of them now (reasons not clear to me).   She denies any visual change, nausea, vomiting, fevers, chills, or  nocturnal exacerbation of difficulty with dyspnea, cough, subjective  wheeze, and finds she needs Xopenex on the average of 3 times daily in  addition to the Qvar 80 two puffs b.i.d.   For full list of medications, please see comprehensive medication  inventory sheet which correlates to the calendar that she carries with  her and was inventoried correctly in the column dated August 28, 2007.   PHYSICAL EXAMINATION:  GENERAL:  She is a pleasant ambulatory white  female who does not appear  cushingoid at all.  VITAL SIGNS:  She has stable vital signs.  She does have somewhat of a  congested cough but otherwise looks quite robust.  HEENT:  Remarkable for no significant tenderness over her frontal  sinuses (this exam was done in the morning and the patient is not yet  severe).  Nasal turbinates reveal minimal edema and erythema, with no  cyanosis changes, polyps, purulent secretions.  Oropharynx is clear.  LUNGS:  Lung fields reveal junky rhonchi bilaterally on inspiration and  expiration.  CARDIAC:  Regular rate and rhythm without murmurs, rubs, or gallops.  ABDOMEN:  Soft, benign.  EXTREMITIES:  Warm without calf tenderness, cyanosis, or clubbing.   IMPRESSION:  Severe chronic rhinitis, sinusitis, and bronchiectasis  complicated by mucociliary dysfunction and colonization with multiple  gram-negative rods.  I do not believe these actually represent  pathogens, as in the past we found that even when the micro comes back,  it shows resistance to antibiotics but seem effective  to control the  clinical syndrome.  In this particular case, the Escherichia coli for  instance was resistant to Cipro and yet she seemed to clear on Levaquin.   We do have the option of covering both of these recent organisms with  imipenem and parenthetically note that when she has been in the  hospital, we have used Primaxin effectively for exacerbations, but she  states she is just not that sick at this point.   Her main concern focuses on her sinuses but may actually have nothing to  do with her airways disease.  The pattern is more of a tension headache  since it comes on more in the afternoon and while sleeping and has not  been relieved by Afrin.  I did recommend a sinus CT scan and trial of  Advil Cold and  Sinus with referral back to ENT of her choice if she has  evidence of active sinusitis involving the frontal sinuses.   Followup otherwise will be every 6 weeks, sooner if needed.  I  spent  extra time with this patient going over her history and also making sure  that we were reading from the same page in terms of both her  maintenance and her as needed medicines from her medication calendar.     [name]. [name], MD, [facility]  Electronically Signed    MBW/MedQ  DD: 08/28/2007  DT: 08/28/2007  Job #: [id number]

## 2011-02-08 NOTE — Assessment & Plan Note (Signed)
[facility]                             PULMONARY OFFICE NOTE   NAME:[place], KALYNN DECLERCQ                      MRN:          [id number]  DATE:08/30/2007                            DOB:          [birth year]    HISTORY OF PRESENT ILLNESS:  The patient is a very pleasant female  patient of Dr. [name], who has a known history of bronchiectasis, asthma,  and chronic rhinitis that presents today for followup.  The patient has  a known history of osteoporosis in the past and had previously been on  Forteo.  The patient did have to discontinue therapy early due to  intolerance.  However, her followup bone density did show significant  improvement with T scores from negative 2.79 down to negative 2.3 in her  left femoral neck and from negative 2.35 down negative 0.4 in her spine.  The patient will be evaluated for possible Reclast therapy and paperwork  will be completed today.  The patient has been intolerant to  bisphosphonates in the past due to upper airway irritation and reflux.  The patient has recently undergone a CT scan of the sinuses.  That  showed opacification of the right aspect of the frontal sinus, post-op  changes, bilateral maxillary sinus mucosal thickening, and a possibility  of  some fluid in the left maxillary sinus.  Some of this is felt to be  mostly chronic.  It is difficult to determine if this is acute or  chronic.  The patient has recently finished a 10-day course of Levaquin  750 mg.  The patient reports that symptoms are slightly improved but she  has not returned back to her baseline.  The patient denies any fever,  neck pain, chest pain, shortness of breath, or nausea, vomiting.   PAST MEDICAL HISTORY:  Reviewed.   CURRENT MEDICATIONS:  Reviewed.   PHYSICAL EXAMINATION:  GENERAL:  The patient is a pleasant female in no  acute distress.  VITAL SIGNS:  She is afebrile with stable vital signs.  HEENT:  Nasal mucosa is pale.  Nontender  sinuses.  TMs normal.  The  patient's posterior pharynx is clear.  NECK:  Supple without cervical adenopathy.  No JVD.  Negative nuchal  rigidity.  RESPIRATORY:  Lung sounds reveal a few scattered rhonchi and coarse  breath sounds bilaterally.  CARDIAC:  Regular rate.  ABDOMEN:  Soft, nontender.  EXTREMITIES:  Warm without any or edema.   IMPRESSION AND PLAN:  1. Osteoporosis with significant improvement on Forteo.  The patient      will be evaluated for Reclast.  Labs including B-MET, magnesium,      and phosphorous are pending and the patient's paperwork will be      completed today.  Will follow up once all results are returned.  2. Recent acute sinusitis.  The patient is now finished a 10-day      course of antibiotics.  I have recommended that as she continues to      have symptoms with the most recent CT scan we      will refer back  over to ENT, Dr. Jearld Fenton, for further evaluation and      possible treatment options.  The patient will follow back up with      Dr. [name] in 6 weeks or sooner if needed.      [name], NP  Electronically Signed      [name]. [name], MD, [facility]  Electronically Signed   TP/MedQ  DD: 09/03/2007  DT: 09/03/2007  Job #: [id number]

## 2011-02-08 NOTE — Assessment & Plan Note (Signed)
[facility]                             PULMONARY OFFICE NOTE   NAME:[name], [name]                      MRN:          [id number]  DATE:07/10/2007                            DOB:          1933-02-05    PULMONARY FOLLOWUP OFFICE VISIT   HISTORY:  A 77 year old white female with severe chronic asthmatic  bronchitis and sinusitis complicated by bronchiectasis who returns for  another episode of increased cough with subjective wheezing on a very  complicated medical regimen which includes prednisone with a floor of  10 mg tablets dose one-half daily (she has already increased to 20 mg  per day with minimal improvement).   She denies any increasing in purulent sputum, pleuritic pain, fevers,  chills, sweats, orthopnea, PND or leg swelling.   For full inventory of medications, please see face sheet dated July 10, 2007.   PHYSICAL EXAMINATION:  She is a minimally cushingoid and chronically ill  appearing ambulatory white female in no acute distress.  HEENT: Is unremarkable. Oropharynx is clear.  LUNGS: Lung fields reveal junky inspiratory and expiratory rhonchi.  These are symmetric bilaterally.  HEART: Regular rate and rhythm without murmur, gallop or rub.  ABDOMEN: Soft, benign.  EXTREMITIES: Warm without calf tenderness, cyanosis, clubbing or edema.   IMPRESSION:  Chronic asthmatic bronchitis, not well controlled despite  systemic steroids and high doses of Qvar as well as frequent beta 2  agonists. One issue might be generic metoprolol. Even in low doses this  may have a mild spill over effect and therefore I am going to switch her  to generic bisoprolol which has minimum beta 2 effect at a dose of 2.5  mg daily with the option of increasing to 5 mg per day if not well-  controlled.   I reviewed all of her maintenance and p.r.n. medications and realized  that she was due for another vitamin D level today which was drawn and  will be  checked to see if she needs additional vitamin D to offset the  effects of prednisone on bone homeostasis.   Followup will be here in four weeks for a medication calendar. Will see  her sooner if her extensive list of p.r.n.'s does not maintain at her  baseline functional level.   Flu vaccination was given today.    [name]. [name], MD, [facility]  Electronically Signed   MBW/MedQ  DD: 07/10/2007  DT: 07/11/2007  Job #: [id number]

## 2011-02-08 NOTE — H&P (Signed)
[name], [name]               ACCOUNT NO.:  [id number]   MEDICAL RECORD NO.:  [id number]          PATIENT TYPE:  INP   LOCATION:  1227                         FACILITY:  [facility]   PHYSICIAN:  [name]. [name], MD, FCCPDATE OF BIRTH:  02/[birth year]   DATE OF ADMISSION:  06/12/2008  DATE OF DISCHARGE:                              HISTORY & PHYSICAL   CHIEF COMPLAINT:  Abdominal pain and weakness.   HISTORY:  This is an exceptionally challenging 77 year old white female  with severe chronic bronchiectasis/sinusitis associated with asthmatic  bronchitis for which she has not been able to taper off of prednisone  for several years and uses a floor of 5 mg daily.  I had seen her in the  office on September 14 complaining of general malaise feeling that she  needed to increase her prednisone dose, but did not have any specific  complaints in terms of increasing dyspnea or cough and I encouraged her  to continue the lower dose.  However, yesterday, the 16th, she took 40  mg because she just did not feel good and then developed generalized  abdominal pain with vomiting and worsening weakness and came to the  emergency room to today September 17, and was hypotensive on arrival,  resuscitated with fluids, and I was asked to admit her by the ER  emergency room doctor.   On my arrival the patient says she has not had any relief of the  abdominal pain since arrival.  However, she had not been nauseated or  had vomiting in the emergency room.  She denied any rivers, previous  history of abdominal pain, or difficulty with swallowing or diarrhea.   PAST MEDICAL HISTORY:  1. Hypothyroidism.  2. Bronchiectasis/asthmatic bronchitis/sinusitis, previously followed      at [facility] now followed by Dr. Lucie Leather.  3. Hyperlipidemia.  4. TIAs.  5. Osteoporosis.   ALLERGIES:  ERYTHROMYCIN, IODINE, SULFA.   MEDICATIONS:  1. Multivite 1 daily.  2. Estradiol 1 Tuesday and  Saturday.  3. Synthroid 125 mcg daily.  4. Citrucel 1 tablet by mouth daily.  5. Fluticasone spray 2 puffs b.i.d.  6. Xalatan eye drops 1 drop both eyes at bedtime.  7. Qvar 40 two puffs b.i.d.  8. Plavix 75 mg 1 daily.  9. Prednisone 10 mg tablets 1/2 q. a.m. took 40 mg yesterday.  10.Potassium 20 mEq b.i.d.  11.Zyrtec 10 mg daily.  12.Bisoprolol 5 mg 1/2 daily.  13.Amlodipine 2.5 mg daily.  14.Hyzaar 100/25 one daily.  15.Prevacid 15 mg b.i.d.  16.Zyflo 600 b.i.d.  17.Calcium with D b.i.d.  18.Vitamin D 50,000 units once weekly.  19.Advil Cold and Sinus p.r.n.  20.Mucinex DM p.r.n.  21.Alprazolam 0.25 mg q.6 p.r.n.  22.Advil 200 mg p.r.n.  23.Pepcid 10 mg at bedtime.   SOCIAL HISTORY:  She has never smoked.  She is a retired Runner, broadcasting/film/video.  Denies any alcohol use.   FAMILY HISTORY:  Positive for MI in her father age 30.  May have had a  stroke at age 41.  Hypertension in A sister.  Multiple myeloma in  maternal  and paternal aunts as well.   REVIEW OF SYSTEMS:  Taken in detail and negative except as outlined  above.   PHYSICAL EXAMINATION:  CONSTITUTIONAL:  This is an uncomfortable but not  toxic-appearing elderly white female in no acute distress.  VITAL SIGNS:  Stable presently with blood pressure 120/85 after arrival  and blood pressure 77/61 before fluids.  HEENT is unremarkable.  Oropharynx is clear.  Mucosa is dry.  NECK:  Supple without cervical adenopathy.  Trachea is midline.  LUNGS:  The lung fields reveal junky inspiratory and expiratory rhonchi,  which is her baseline.  HEART:  Regular rate and rhythm without murmur, rub.  Pulse rate is  around 100.  ABDOMEN:  Moderately distended, diffusely tender with guarding and also  marked rebound.  EXTREMITIES:  Calf cool without calf tenderness, cyanosis, clubbing, or  edema.   LAB DATA:  Include white count 24,000, hematocrit of 51%.  Sodium 133,  potassium 3.3, chloride 91, CO2 is 27, glucose 148, BUN 24, creatinine   2.0 versus a baseline of 0.8 obtained January 07, 2008, normal LFTs,  normal lipase.  Urinalysis negative.   IMPRESSION:  1. Hypovolemic shock with hemoconcentration secondary to abdominal      sepsis of unclear etiology.  Differential diagnoses includes      perforated viscus especially duodenal ulcer based on the fact that      she is on chronic steroids and nonsteroidals.  I recommended      empiric antibiotics with Primaxin, flat and upright abdominal      films, and will repeat a lipase and amylase was giving her fluids      in the form of Hespan and crystalloid.  2. Steroid dependent asthmatic bronchitis.  Will give her stress      steroids IV.  3. Hypothyroidism changed over to IV Synthroid.  4. Acute renal insufficiency with a baseline creatinine of 0.8.  This      is most likely secondary to hypovolemia and      shock.  Recommended volume expansion and elimination of ARB therapy      and, of course, all antihypertensives at this point.  See admission      orders.  The patient will be placed in the ICU and general surgery      asked to evaluate her.      [name]. [name], MD, [facility]  Electronically Signed     MBW/MEDQ  D:  06/12/2008  T:  06/15/2008  Job:  [id number]

## 2011-02-08 NOTE — Assessment & Plan Note (Signed)
[facility]                             PULMONARY OFFICE NOTE   NAME:[name], [name]                      MRN:          [id number]  DATE:02/26/2007                            DOB:          1933-07-23    HISTORY:  A 77 year old white female here for followup lipid profile  with a history of TIAs with evidence of small vessel disease by previous  CT scan and for that reason maintained on Plavix daily. She is also on  an extremely complicated medical regimen for which she has a calendar  and it correlates nicely with the medication on the face sheet, dated  February 26, 2007. She does have intermittent green mucous but feels that the  Cipro 750 for 10 days controls this well.   For a full inventory of medications please see face sheet, dated February 26, 2007 which is correct as listed.   PHYSICAL EXAMINATION:  GENERAL:  She is a pleasant and ambulatory.  HEENT:  Unremarkable. Pharynx clear.  LUNGS:  Lung fields reveal junky rhonchi bilaterally with end-expiratory  wheeze.  HEART:  She has a regular rate and rhythm without murmur, gallop or rub.  ABDOMEN:  Soft and benign.  EXTREMITIES:  Warm without calf tenderness, cyanosis, clubbing or edema.   Hemoglobin saturation 93% on room air.   IMPRESSION:  1. Hyperlipidemia. She is not at goal by her most recent study.      Because she has a history of atherosclerotic disease involving the      CNS, I recommended she maintain an LDL of less than 80 but has not      been to get to that point on her own. I have emphasized to her that      although she may read bad things about lipid medications in terms      of side effects, certainly the risk/benefit ratio is strongly in      favor of taking the medication based on the fact that she already      has atherosclerosis, not that she is at risk for it.  2. Intermittent purulent tracheobronchitis in a patient with      bronchiectasis sinusitis well controlled with  courses of Cipro.  3. Complex medical regimen. I reviewed each of her medicines with her      in detail including her p.r.n.'s noting that she presently is      maintained on prednisone at 10 mg 1/2 daily but has the ability to      increase this if needed. To offset the effects of prednisone, we      are also treating her for osteoporosis with Forteo and high dose      vitamin D.   I reviewed all of these issues as well and will see her back here every  3 months for followup, sooner if needed with the option of adding statin  back in the meantime if her LDL is not progressing toward goal.     Casimiro Needle B. [name], MD, [facility]  Electronically Signed   MBW/MedQ  DD: 02/26/2007  DT:  02/27/2007  Job #: [id number]

## 2011-02-08 NOTE — H&P (Signed)
NAMEJERZY, [name]               ACCOUNT NO.:  [id number]   MEDICAL RECORD NO.:  [id number]          PATIENT TYPE:  INP   LOCATION:  0109                         FACILITY:  [facility]   PHYSICIAN:  [name]. [name], MD, FCCPDATE OF BIRTH:  [birth year] 03, 1932   DATE OF ADMISSION:  07/02/2008  DATE OF DISCHARGE:                              HISTORY & PHYSICAL   CHIEF COMPLAINT:  Nausea, vomiting, productive sputum.   HISTORY OF PRESENT ILLNESS:  A 77 year old female patient followed by  Dr. [name] in the outpatient setting for complicated bronchiectasis for  which she is prednisone-dependent and on cyclic antibiotics.  She was  recently discharged from the hospital in mid September 8 following  exploratory laparotomy and small-bowel resection for small-bowel  obstruction and ischemic terminal ileum.  The patient presenting on  July 02, 2008, with chief complaint of 2 day history of progressive  nausea and vomiting.  She states she had eaten clam chowder on the  evening of June 30, 2008, early in the morning of July 01, 2008.  At  approximately 5 a.m. she began to vomit violently.  She spent all day on  July 01, 2008, in bed.  Noted poor p.o. intake, was able to get  essentially what equates to a single serving of Jello down.  She awoke  again the morning of July 02, 2008, with violent vomiting, also a  fever as high as 102.  She has had progressive weakness as well as  worsening changes in sputum color and quantity.  Typical day reports  several teaspoons of yellow colored mucus, she has had persistent cough  with yellow-green sputum at this point which is a large volume in  nature.  The patient denies shortness of breath, chest pain other than  some pleuritic-type discomfort which she reports with a deep breath  primarily focused over the right flank.  Because of her progressive  weakness she called the EMS system and upon evaluation was brought to  the emergency room at [facility].  Chest x-ray obtained was  obtained.  This demonstrated no acute findings.  She did have mild  leukocytosis on exam.  She complained of significant abdominal  discomfort to palpation.  Dr. Colin Benton has seen her at the bedside and has  no current recommendations.  She is empirically treated with antibiotics  as well as supplemental FIO2.  She will be admitted for intravenous  therapy as well as plans for antibiotics.   PAST MEDICAL HISTORY:  As follows:  1. Recent exploratory laparotomy and small bowel resection with      anastomosis and closure with retention sutures in the setting of      small-bowel obstruction with ischemic terminal ileum.  2. Bronchiectasis.  3. Hypothyroidism.  4. Hyperlipidemia.  5. Transient ischemic attack.  6. Osteoporosis.   FAMILY HISTORY:  Father died of an MI.  Sister with hypertension.  Multiple myelomas in maternal and paternal aunts.  .   SOCIAL HISTORY:  She never smoked.  She is a retired Runner, broadcasting/film/video.  Denies  any EtOH abuse.   ALLERGIES:  1.  CODEINE.  2. ERYTHROMYCIN.  3. SULFA.  4. IODINE.  5. ADHESIVE TAPE.   HOME MEDICATIONS:  As follows:  1. Multivitamin 1 daily.  2. Vivelle/ DOT 0.025 mg every 24 hours.  He was on Tuesday and      Saturday.  3. Synthroid 125 mcg daily.  4. Citrucel 500 mg daily.  5. Fluticasone propionate 2 puffs twice a day.  6. Xalatan 0.05% solution 1 drop both eyes at bedtime.  7. Qvar 40 mcg 2 times a day.  8. Plavix 75 mg daily.  9. Prednisone 10 mg tablet, takes 1/2 tab daily.  10.Klor-Con 20 mEq twice a day.  11.Zyrtec allergy 10 mg daily.  12.Bisoprolol fumarate 5 mg tablet, takes 1/2 tab daily.  13.Amlodipine 2.5 mg takes 1 tab daily.  14.Hyzaar 100/25 one tablet daily.  15.Prevacid 15 mg 2 times a day.  16.Zyflo CR 600 mg every 12 hours.  17.Calcium with vitamin D 500/400 two times a day.  18.Vitamin D supplementation 50,000 unit caps once a week.  19.Advil Cold and Sinuses p.r.n.  20.Mucinex D  60/600 p.r.n.  21.Alprazolam 0.25 mg 1 every 6 p.r.n.  22.Xopenex p.r.n.   REVIEW OF SYSTEMS:  Per HPI.   PHYSICAL EXAM:  Temperature 102.6.  Heart rate 126.  Respirations 22.  Blood pressure 142/51.  Saturation 93%.  GENERAL:  This is an ill-appearing white female currently in no acute  distress.  HEENT:  Mucous membranes are dry.  The lips are cracked.  NECK:  Veins are flat.  There is no adenopathy on palpation.  PULMONARY:  Notable for diffuse scattered rhonchi throughout.  She is  coughing up copious amounts of thick pea-green-appearing sputum.  Chest  x-ray demonstrates right greater than left infiltrates which appeared  somewhat chronic in nature.  CARDIAC:  Tachy, irregular rate and rhythm.  EXTREMITIES:  Warm, dry with 2+ pulses.  ABDOMEN:  Soft, however tender to palpation with purulent-colored  drainage from the old horizontal stay suture sites.  The abdominal wound  is well-approximated, however erythema.  NEUROLOGICALLY:  Grossly intact.   LABORATORY DATA:  Sodium 131, potassium 3.5, chloride 94, CO2 24, BUN  12, creatinine 0.98, glucose 97, hemoglobin 13.1, hematocrit 39.8, white  blood cell count 18.5, platelet 601, INR 1.1, neutrophils 81%.  Urinalysis negative.  Lipase 66.   IMPRESSION AND PLAN:  Is that of:  1. Nausea, vomiting with dehydration.  Likely secondary to      gastroenteritis and persistent nausea.  After hydration may need to      consider CT abdomen for further evaluation.  Presently plan will be      to treat with close observation, IV rehydration, and p.r.n.      antiemetics.  She has had no diarrhea with the above symptoms.  At      this point without diarrhea, we will hold off on stool culture      sampling.  2. Purulent bronchitis, rule out pneumonia in the setting of chronic      bronchiectasis.  She is a pseudomonas risk factor, also risk factor      for aspiration, and underlying healthcare-associated pneumonia.      Plan for this at this  point will be to check sputum, blood      cultures, urine strep and Legionella antigen.  And empirically      treat for hospital-associated organisms.  She will be admitted to      the regular ward, therapy will include IV  vancomycin, Avelox, and      Zosyn and Prednisone dose with Solu-Medrol .  3. Hypothyroidism.  Plan for this is to continue thyroid      supplementation.  4. History of gastroesophageal reflux disease.  For this we will      continue her proton pump inhibitor.  5. History of hypertension.  As she is mildly volume depleted, we will      hold diuretic, we will continue her beta-blocker only at this      point, and slowly re-add medications as indicated.    Plan:  We will place on subcutaneous Heparin for  deep vein thrombosis  prophylaxis, and follow closely in the inpatient setting.  Again as she  has been eval  by Dr Colin Benton for surgery who does not feel that there is  am abdomenal source  so  we will refrain from CT abdomen, pelvis at this  point, however, would have a low threshold should nausea continue.      Zenia Resides, NP      [name]. [name], MD, [facility]  Electronically Signed    PB/MEDQ  D:  07/02/2008  T:  07/02/2008  Job:  [id number]

## 2011-02-08 NOTE — Consult Note (Signed)
[name], [name]               ACCOUNT NO.:  [id number]   MEDICAL RECORD NO.:  [id number]          PATIENT TYPE:  INP   LOCATION:  1227                         FACILITY:  [facility]   PHYSICIAN:  Velora Heckler, MD      DATE OF BIRTH:  12-May-1933   DATE OF CONSULTATION:  06/12/2008  DATE OF DISCHARGE:                                 CONSULTATION   REFERRING PHYSICIAN:  Dr. [name], critical care medicine.   CHIEF COMPLAINT:  Abdominal pain, nausea and vomiting.   HISTORY OF PRESENT ILLNESS:  The patient is a 77 year old white female  patient of Dr. [name] with steroid dependent pulmonary disease.  The patient had taken a large dose of prednisone on the day prior to  admission.  She subsequently developed epigastric abdominal pain with  nausea and vomiting.  This persisted.  She presented to the emergency  department for assessment.  The patient was seen and evaluated by the  emergency room physician.  She was noted to have an elevated white blood  cell count of 24,000.  Abdominal x-rays were obtained which showed some  dilated loops of small bowel consistent with partial small-bowel  obstruction.  The patient was seen by Dr. [name] and admitted to the  intensive care unit for empiric antibiotic therapy and resuscitation.  General surgery was consulted for evaluation.   PAST MEDICAL HISTORY:  History of hypertension, history of emphysema,  history of glaucoma, history of hypothyroidism, history of transient  ischemic attacks, status post cholecystectomy.   MEDICATIONS:  Plavix, prednisone, potassium, metoprolol, Sular, Hyzaar  Prevacid, Xopenex, Synthroid, Xalatan;   ALLERGIES:  TO CODEINE, ERYTHROMYCIN, IVP DYE, IODINATED CONTRAST,  SULFA.   SOCIAL HISTORY:  The patient denies tobacco or alcohol use.   FAMILY HISTORY:  Noncontributory.   REVIEW OF SYSTEMS:  A 15 system review documented in the medical record.   EXAM:  GENERAL APPEARANCE:  A 77 year old  frail-appearing white female  in the ICU [facility].  VITAL SIGNS:  Temperature 98.7, pulse 96, blood pressure 175/80,  respirations 22, O2 saturation 95%.  HEENT:  Shows her be normocephalic.  Sclerae are clear.  Dentition fair.  Mucous membranes moist.  NECK:  Supple, nontender, no lymphadenopathy.  No thyroid nodularity.  No tenderness.  LUNGS:  Showed breath sounds bilaterally.  Few crackles on the left.  CARDIAC:  Exam shows regular rate and rhythm.  Peripheral pulses are  full.  ABDOMEN:  Shows moderate distention.  Bowel sounds are present on  auscultation. There are healed surgical wounds consistent with previous  cholecystectomy.  There does not appear to be any herniation.  The  patient is diffusely tender but maximum tenderness in the left lower  quadrant.  No guarding.  No rebound.  No palpable mass.  No  hepatosplenomegaly.  EXTREMITIES:  Nontender without edema.  NEUROLOGICALLY:  The patient is alert and oriented without gross focal  deficit.   LABORATORY STUDIES:  White count 24.4, hemoglobin 17.1, platelet count  552,000, potassium is low at 3.3, BUN 24, creatinine 2.06.  Liver  function tests are  normal.  Urinalysis is benign.   RADIOGRAPHIC STUDIES:  Chest x-ray shows no active disease.  Abdominal x-  ray shows a few dilated loops of small bowel with air-fluid levels.  However, there is gas and stool in the colon.  This was consistent with  partial small-bowel obstruction.  No free air.   IMPRESSION:  Abdominal pain and significant leukocytosis in patient on  chronic steroids for lung disease.   PLAN:  The patient  had a nasogastric tube placed.  She is n.p.o.  She  is receiving intravenous hydration per the critical care medicine  service.  She has been started empirically on Primaxin.  I will order a  CT scan of the abdomen and pelvis to better evaluate for possible acute  intra-abdominal process.  The patient will require close  follow-up.  Hopefully she will not require laparotomy in view of her severe lung  disease.      Velora Heckler, MD  Electronically Signed     TMG/MEDQ  D:  06/12/2008  T:  06/15/2008  Job:  [id number]   cc:   [name]. [name], MD, FCCP  [address]  [place] [address]   Velora Heckler, MD  [address] [place]  [place] [address]

## 2011-02-08 NOTE — Assessment & Plan Note (Signed)
[facility]                             PULMONARY OFFICE NOTE   NAME:[name] [name] NEAULT                      MRN:          [id number]  DATE:08/06/2007                            DOB:          [birth year]    HISTORY OF PRESENT ILLNESS:  The patient is a 77 year old white female  of Dr. Thurston Hole with know history of severe chronic asthmatic bronchitis  and sinusitis complicated by bronchiectasis, presents today for an acute  office visit.  The patient complains of a one week history of nasal  congestion, sinus pain and pressure with green nasal discharge and  productive cough of thick green sputum.  The patient denies any  hemoptysis, orthopnea, PND or leg swelling.   PAST MEDICAL HISTORY:  Reviewed.   CURRENT MEDICATIONS:  Reviewed.   PHYSICAL EXAMINATION:  GENERAL:  This is a pleasant female in no acute  distress.  VITAL SIGNS:  Afebrile with stable vital signs.  O2 saturation is 93% on  room air.  HEENT:  Nasal mucosa is erythematous.  Maxillary sinus syndrome.  Posterior pharynx is clear.  NECK:  Supple without adenopathy.  No JVD.  LUNGS:  Sounds reveal coarse breath sounds.  CARDIAC:  Regular rate.  ABDOMEN:  Soft and benign.  EXTREMITIES:  Warm without any edema.   IMPRESSION/PLAN:  1. Acute asthmatic bronchitic exacerbation with sinusitis.  The      patient is to begin Levaquin 750mg  daily for 10 days.  The patient      will add in Mucinex 1-2 tablets twice daily.  The patient is to      return back in two weeks with Dr. [name] as scheduled or sooner if      needed.  2. Osteoporosis.  Bone density is pending.  The patient has now      finished a course of Forteo and is recommending use calcium with      vitamin D twice daily.  We will evaluate the patient for possible      initiation of request therapy once her bone density reports are      available.      [name], NP  Electronically Signed      [name]. [name], MD, [facility]  Electronically Signed   TP/MedQ  DD: 08/07/2007  DT: 08/08/2007  Job #: [id number]

## 2011-02-08 NOTE — Discharge Summary (Signed)
NAMEDELENE, MORAIS               ACCOUNT NO.:  [id number]   MEDICAL RECORD NO.:  [id number]          PATIENT TYPE:  INP   LOCATION:  1522                         FACILITY:  [facility]   PHYSICIAN:  [name]. [name], MD, FCCPDATE OF BIRTH:  Feb 20, 1933   DATE OF ADMISSION:  06/12/2008  DATE OF DISCHARGE:  06/20/2008                               DISCHARGE SUMMARY   DISCHARGE DIAGNOSES:  1. Small bowel obstruction with infarcted bowel status post      exploratory laparotomy June 13, 2008.  2. Chronic obstructive pulmonary disease.  3. Hypertension.  4. Acute renal failure.  5. Type 2 diabetes/hyperglycemia.   PROCEDURES:  Laparotomy by Dr. Colin Benton on June 13, 2008.  Culture  data negative.   LABORATORY DATA:  June 17, 2008:  Hemoglobin 10, hematocrit 29.8,  potassium 2.4, BUN 2, calcium 7, white blood cell count 9.4, platelet  count 275, sodium 136, chloride 102, bicarbonate 29, glucose 95,  creatinine 0.49.   BRIEF HISTORY:  This is a 77 year old white female with steroid  dependent asthmatic bronchitis and bronchiectasis who was admitted on  June 12, 2008 with a 24 history of nausea, vomiting and abdominal  pain.  Further evaluation demonstrated significant hypotension, acute  renal failure and CT abdomen consisted of small bowel obstruction.  She  was brought  emergently to the operating room by Dr. Colin Benton who performed  a exploratory laparotomy.   HOSPITAL COURSE BY DISCHARGE DIAGNOSES:  1. Small bowel obstruction with ischemic bowel.  Miss Sebastiani      underwent surgery as previously mentioned on June 12, 2008      which consisted of exploratory laparotomy and small bowel      resection.  Approximately 20 cm of bowel was resected during this      procedure.  She returned to intensive care postoperatively.      Completed a 5-day course of empiric Primaxin, and diet was      advanced.  She was eventually transitioned to the medical ward and      discharged  on June 20, 2008 by Dr. Colin Benton.  2. Chronic obstructive pulmonary disease.  No alteration in her      typical regimen was required.  She was maintained on supplemental      oxygen, inhaled bronchodilators.  Did receive stress dose steroids,      but was discharged to home on her maintenance dosing.  3. Hypertension.  She remained on her typical regimen for this.  4. Hyperglycemia.  She was given sliding scale insulin for this.   DISCHARGE MEDICATIONS:  1. Xopenex Neb 1.25 mg inhaled 3 times daily.  2. Zilanntin eye drops 1 drop each eye daily.  3. Plavix 75 mg daily.  4. Citracal as needed.  5. Vivaltic daily.  6. Mucinex 600 mg as needed.   DISCHARGE INSTRUCTIONS:  Diet as tolerated.  Activity as tolerated.  Followup Dr. Colin Benton 3 weeks following discharge.   DISCHARGE DISPOSITION:  Ms. Hinds was cleared by surgery for discharge  and was discharged to home on the 25th.      Zenia Resides,  NP      [name]. [name], MD, [facility]  Electronically Signed    PB/MEDQ  D:  07/29/2008  T:  07/29/2008  Job:  [id number]

## 2011-02-08 NOTE — Op Note (Signed)
NAMEJODETTE, [name]               ACCOUNT NO.:  [id number]   MEDICAL RECORD NO.:  [id number]          PATIENT TYPE:  INP   LOCATION:  1227                         FACILITY:  [facility]   PHYSICIAN:  Alfonse Ras, MD   DATE OF BIRTH:  10-12-1932   DATE OF PROCEDURE:  DATE OF DISCHARGE:                               OPERATIVE REPORT   PREOPERATIVE DIAGNOSIS:  Small bowel obstruction.   POSTOPERATIVE DIAGNOSES:  Small bowel obstruction with ischemic terminal  ileum and internal hernia.   PROCEDURE:  Exploratory laparotomy and small bowel resection with  primary anastomosis and closure with retention sutures.   SURGEON:  Alfonse Ras, M.D   ANESTHESIA:  General.   DESCRIPTION:  The patient was taken to the operating room and placed in  supine position.  After adequate general anesthesia was induced using  endotracheal tube, the abdomen was prepped and draped in normal sterile  fashion.  Using a vertical midline incision near the umbilicus, I  dissected down the fascia and this was opened with electrocautery.  On  entering the abdomen, there was a significant amount of serosanguineous  fluid which was aspirated and sent for cytology.  Small bowel was quite  dilated and was run from the ligament of Treitz down to the terminal  ileum where there appeared to be a piece of omentum stuck down to  probable previous diverticulitis.  There was no evidence of  diverticulitis, however, the small bowel was incarcerated within this  adhesion.  Then the adhesion was lysed.  At about 27-cm segment, the  small bowel was quite ischemic and I opted to resect that using GIA  stapling devices and a primary anastomosis with a 75 GIA stapling  device, and that was closed with a TA-60.  Tisseel was placed and the  mesentery was closed with interrupted 2-0 silk sutures.  The abdomen was  copiously irrigated and the fascia was closed with running #1 Novofil  and #5 Ethilon retention sutures.  Skin  incision was closed with  staples.  Sterile dressing was applied.  The patient tolerated the  procedure well and went to PACU in good condition.      Alfonse Ras, MD  Electronically Signed     KRE/MEDQ  D:  06/13/2008  T:  06/14/2008  Job:  [id number]   cc:   [name]. [name], MD, FCCP  [address]  [place] [address]

## 2011-02-08 NOTE — Assessment & Plan Note (Signed)
[facility]                             PULMONARY OFFICE NOTE   NAME:Hallquist, [name]                      MRN:          [id number]  DATE:08/13/2007                            DOB:          [birth year]    HISTORY OF PRESENT ILLNESS:  The patient is a 77 year old white female  patient of Dr. Thurston Hole with a known history of severe chronic asthmatic  bronchitis and sinusitis complicated by bronchiectasis, who returns  today related to persistent cough and congestion.  The patient was seen  in the office 1 week ago for acute asthmatic bronchitic exacerbation  with sinusitis.  She was started on Levaquin 750 mg for 10 days and the  patient reports symptoms have only minimally improved and the patient  did increase her prednisone up for a prednisone taper, but has now  tapered down to her normal of 5 mg daily.  The patient denies any  hemoptysis, orthopnea, PND or leg swelling.   PAST MEDICAL HISTORY:  Reviewed.   CURRENT MEDICATIONS:  Reviewed.   PHYSICAL EXAMINATION:  GENERAL:  The patient is a pleasant female in no  acute distress.  VITAL SIGNS:  She is afebrile with stable vital signs.  HEENT:  Nasal mucosa with some mild redness.  Nontender sinuses.  NECK:  Supple without cervical adenopathy.  No JVD.  LUNGS:  Sounds reveal coarse rhonchi bilaterally.  CARDIAC:  Regular rate.  ABDOMEN:  Soft and nontender.  EXTREMITIES:  Warm without any edema.   IMPRESSION AND PLAN:  Slow-to-resolve asthmatic bronchitis exacerbation  with underlying bronchiectasis and sinusitis.  The patient is to finish  Levaquin as recommended for a total of a 10-day course.  Continue  Mucinex twice daily and her nasal hygiene regimen.  We will check chest  x-ray today and follow up accordingly.  Also, the patient is to leave a  sputum culture today and we will also follow up on results.  The patient  will follow back up in 2 weeks with Dr. [name] or sooner if needed.      [name], NP  Electronically Signed      [name]. [name], MD, [facility]  Electronically Signed   TP/MedQ  DD: 08/13/2007  DT: 08/14/2007  Job #: [id number]

## 2011-02-11 ENCOUNTER — Encounter (HOSPITAL_BASED_OUTPATIENT_CLINIC_OR_DEPARTMENT_OTHER): Payer: Medicare Other

## 2011-02-11 NOTE — Discharge Summary (Signed)
NAMEKENNICE, FINNIE               ACCOUNT NO.:  [id number]   MEDICAL RECORD NO.:  [id number]          PATIENT TYPE:  INP   LOCATION:  1310                         FACILITY:  [facility]   PHYSICIAN:  Barbaraann Share, MD,FCCPDATE OF BIRTH:  [birth year]   DATE OF ADMISSION:  11/14/2006  DATE OF DISCHARGE:  11/19/2006                               DISCHARGE SUMMARY   DISCHARGE DIAGNOSIS:  Bronchiectasis with acute flare.   HISTORY OF PRESENT ILLNESS:  Ms. Hogge is a 77 year old white female  chronically steroid dependent with chronic sinusitis and asthmatic  bronchitis complicated by bronchiectasis with a purulent  tracheobronchitis with previous history of resistant pseudomonas  colonization.  She has had a prescription for Augmentin to take only on  a p.r.n. basis for purulent sputum.  She had previously had a clinical  response despite pseudomonas culture data and was initially on this on  November 09, 2006, the onset of her previous exacerbation.  Unfortunately, she proved refractory to outpatient treatment, was found  to be hypoxemic, and was admitted for further evaluation and treatment.   PAST MEDICAL HISTORY:  1. Diverticulosis, status post most recent colonoscopy in July of      2003.  2. Colonic dysphagia, most recent upper endoscopy in December of 2004.  3. Status post laparoscopic cholecystectomy in 1995.  4. Status post transient ischemic attack in 2000 with no recurrence of      sequelae.  5. Chronic sinusitis followed by Dr. Jearld Fenton of otolaryngology.  6. Osteoporosis secondary to steroid use on Forteo and dexa daily.  7. Hypertension.   ALLERGIES:  1. ERYTHROMYCIN.  2. IODINE.  3. SULFA.  (All with nonspecific reactions).   LABORATORY DATA:  WBC of 6.3, hemoglobin 15.3, hematocrit 44.5,  platelets 249, ESR of 24.  Sodium 135, potassium 3.1, chloride 92, CO2  of 25, glucose 80, BUN 12, creatinine 1.06, total protein 6.9, albumin  3.1, AST 35, ALT 19, ALP 73, total  bilirubin 1.0.  TSH is .947.  Blood  cultures were negative x2.  Urine culture was negative for legionella,  negative for strep.  __________  culture did show moderate Candida  albicans.  Chest x-ray showed mild cardiac enlargement without failure,  chronic interstitial lung disease involving the lung bases, findings  suggestive of pulmonary fibrosis.   HOSPITAL COURSE BY DISCHARGE DIAGNOSES:  1. Bronchiectasis with acute flare.  She is admitted to [facility] and treated with IV antibiotics, IV Solu-Medrol, along      with nebulized bronchodilators and pulmonary toilet consisting of a      flutter valve.  She reached maximal hospital benefit by November 19, 2006 and was ready for discharge home.  2. Hypokalemia.  She was noted to be hypokalemic when she was      admitted.  She was treated with K-Dur prior to her discharge.   DISCHARGE MEDICATIONS:  1. She is to be on her usual home medications.  2. Ciprofloxacin 750 mg one two times a day.  3. Prednisone 10 mg take 1-1/2 tablets each  day for 2 days, then one      tablet each day for 2 days, then one-half tablet a day.  She is to      follow up with Dr. [name].  She is to be on a no-concentrated sweet      diet.   DISPOSITION/CONDITION ON DISCHARGE:  After being treated for  exacerbation of bronchiectasis, she reached maximal hospital benefit and  was discharged home in stable condition.      Devra Dopp, MSN, ACNP      Barbaraann Share, MD,FCCP  Electronically Signed    SM/MEDQ  D:  01/15/2007  T:  01/15/2007  Job:  [id number]   cc:   [name]. [name], MD, FCCP

## 2011-02-11 NOTE — Discharge Summary (Signed)
NAMEQUERIDA, [name]               ACCOUNT NO.:  [id number]   MEDICAL RECORD NO.:  [id number]          PATIENT TYPE:  INP   LOCATION:  1413                         FACILITY:  [facility]   PHYSICIAN:  Casimiro Needle B. [name], MD, FCCPDATE OF BIRTH:  1932/12/05   DATE OF ADMISSION:  07/21/2006  DATE OF DISCHARGE:  07/27/2006                                 DISCHARGE SUMMARY   FINAL DIAGNOSES:  1. Acute respiratory distress secondary to probable Pseudomonas pneumonia.  2. Bronchiectasis/asthmatic bronchitis with flare this admission.  3. Hypothyroidism.      a.     TSH 6.6 this admission on 800 mcg alternating with 88 mcg daily.  4. Hypokalemia.  5. Hypertension.  6. Tendency to palpitations, controlled with low dose beta blockers.   HISTORY:  Please see dictated H&P.  This is a chronically ill 77 year old  white female, never smoker, with severe bronchiectasis/asthmatic bronchitis  with sinusitis as well.  She was admitted to the office with acute  deterioration with purulent sputum, fever, anorexia, and dyspnea.  Chest x-  ray failed to reveal definite evidence of air space disease, although she  did have increased markings consistent with previous diagnosis of  bronchiectasis.  She was treated empirically with Maxipime, Avelox, and  vancomycin.  Her sputum eventually grew Pseudomonas, so she was switched  over to Maxipime and Levaquin and received a total of seven days of Maxipime  therapy and will complete another seven days of Levaquin (for a total of 14  days of therapy) as an outpatient.   At the time of discharge, she is markedly improved with good appetite and  has been afebrile for 72 hours.  Instead of yellow-green sputum, she has  only slightly yellowish sputum, which is decreased in volume with no chest  pain or dyspnea with activities nor subjective or objective wheezing.   Hypokalemia and hypothyroidism were also addressed during the  hospitalization but were not major  issues.   The patient is therefore discharged in improved condition on the following  maintenance medicines:  Multivitamins 1 daily, Prometrium 100 mg 1 q.p.m.,  Synthroid 100 mcg daily, Citrucel 1 teaspoon daily, Nasonex 2 puffs b.i.d.,  Xalatan eye drops 1 drop each eye at bedtime, QVAR 2 puffs b.i.d., Plavix 75  mg q.a.m., prednisone 10 mg 1/2 q.a.m., potassium 20 mEq 1 b.i.d., Zyrtec 10  mg 1 nightly, metoprolol 25 mg 1 q.a.m., Sular 20 mg 1 q.a.m., Hyzaar 100/25  1 q.a.m., Forteo 20 mcg 1 injection daily, Menostar change patch on Tuesday,  Prevacid 30 mg 1/2 tablet b.i.d.  She also has p.r.n. for cough to use  Mucinex DM and a flutter valve and for nerves to use Xanax 0.25 mg q.6h.  p.r.n. along with p.r.n. wheezing to use albuterol 2.5 mg q.4h. p.r.n.  wheezing.   Followup will be in one week in the office, sooner p.r.n.  She is to  maintain a normal diet and resume normal activities.          ______________________________  [name] [name], MD, [facility]    MBW/MEDQ  D:  07/27/2006  T:  07/27/2006  Job:  [id number]

## 2011-02-11 NOTE — Discharge Summary (Signed)
[facility]  Patient:    [name], [name] Visit Number: [id number] MRN: [id number]          Service Type: MED Location: 3W [id number] Attending Physician:  Avie Echevaria Dictated by:   Earley Favor, RN, MSN, ACNP Admit Date:  01/31/2002 Discharge Date: 02/06/2002   CC:         [name], M.D.  Richarda Overlie, M.D.  Di Kindle. Edilia Bo, M.D.  Mickie Kay, M.D.   Discharge Summary  DATE OF BIRTH:  [birth year]  DISCHARGE DIAGNOSES: 1. Acute exacerbation of bronchiectasis that was refractory to outpatient    treatment. 2. Hyperlipidemia. 3. Hyperthyroidism. 4. History of diverticulitis. 5. Hypertension. 6. History of stroke. 7. Osteoporosis.  HISTORY OF PRESENT ILLNESS:  Mrs. [name] is a very nice but unfortunate 77 year old white female who has never smoked with severe chronic obstructive asthma and bronchiectasis associated with sinusitis with no apparent etiology identified in immunological workup at [facility].  Over the last year, she had been bothered by nonstop coughing to the point at which it is interfering with her sleep at night.  Her mucus slightly clear and quite purulent to severely purulent and has had several courses of Cipro in the last month, which she states are not helping.  She also has home nebulizers as well as Prednisone and varies between five every other day up to 20 mg a day.  She felt like she was barely holding her own in the terms of controlling her symptoms. Also she has noted worsening dysphagia with being maintained on Zantac 150 mg b.i.d. She had poor tolerance to proton pump inhibitors in the past.  Due to the refractory nature of her disease, she is being admitted for IV antimicrobial therapy and further evaluation and treatment inpatient setting.  LABORATORY DATA:  Respiratory culture from Jan 31, 2002 demonstrated Pseudomonas aeruginosa that is sensitive  to ceftazidime and ciprofloxacin. WBC is 3.8, hemoglobin 14.3, hematocrit 41.2, platelets 341.  Sodium 131, potassium 3.5, chloride 100, CO2 25, glucose 117, BUN 17, creatinine 0.9, calcium 8.9.  AST is 24, ALT 18, ALP 56, total bilirubin 0.6.  TSH 0.05.  RADIOGRAPHIC DATA:  Chest CT scan demonstrates extensive diffuse bronchiectasis involving all lobes and COPD. Normal areas of acute infiltrate in the medial segment of right middle lobe.  Limited CT of the paranasal sinuses: Pansinusitis with fluid levels in the right sphenoid and bilateral frontal sinuses opacification of maxillary sinuses.  HOSPITAL COURSE:  #1 - ACUTE EXACERBATION OF BRONCHIECTASIS:  Sputum culture demonstrates Pseudomonas aeruginosa.  On IV therapy with Elita Quick in a home setting for a total of 14 days of antimicrobial therapy with [place].  She was also placed on p.o. ciprofloxacin 150 mg b.i.d. Will continue pulmonary toilet with albuterol nebulizers.  #2 - PANSINUSITIS:  She was placed on nasal hygiene.  She has been followed by Dr. Arletha [name] in the past.  This will be monitored on an outpatient basis.  #3 - HYPERTENSION:  Hypertension continues to be addressed with Hyzaar.  #4 - HYPERTHYROIDISM:  Hyperthyroidism is addressed with Synthroid 100 mcg q.d.  Her TSH is noted to be on the low side.  #5 - SUSPECTED GASTROESOPHAGEAL REFLUX DISEASE:  She was placed on Zantac 150 b.i.d. along with Reglan 10 mg a.c. and h.s.  DISCHARGE MEDICATIONS:  1. Cipro 750 mg b.i.d. x9 days.  2. Elita Quick 2 gm IV q.12h. x9 days.  3. Humibid LA two tabs b.i.d.  4. Citrucel one pack q.d.  5. Nasonex two sprays b.i.d.  6. Plavix 75 mg one q.d.  7. K-Dur 20 mg one b.i.d.  8. Hyzaar 125 one q.d.  9. Prempro 0.625/2.5 one q.d. 10. Synthroid 100 mcg one q.d. 11. Reglan 10 mg a.c. and h.s. 12. Zantac 150 mg b.i.d. 13. Albuterol 2.5 hand held nebulizer q.i.d. 14. Prednisone on taper 30 mg x3 days, 20 mg x3 days, 10 mg x3 days.  DIET:   Low salt diet.  SPECIAL CARE: IV care per home R.N.  Note, on day of discharge she has an IV in the left forearm that placed on Feb 05, 2002.  SPECIAL INSTRUCTIONS:  To call for any problems.  FOLLOW-UP: She has a follow up appointment with Dr. [name] on Feb 14, 2002.  DISPOSITION/CONDITION ON DISCHARGE:  Her bronchiectasis has been documented by CT scan.  Noted to be cylindrical in nature and extensive along with her chronic obstructive pulmonary disease.  Sputum culture is positive for Pseudomonas aeruginosa and was being addressed by dual antimicrobial coverage. Sinusitis has been demonstrated.  She is on nasal hygiene and also dual antimicrobial coverage should cover pathogens of the nares.  All and all she is being discharged home in improved condition. Dictated by:   Earley Favor, RN, MSN, ACNP Attending Physician:  Avie Echevaria DD:  02/06/02 TD:  02/06/02 Job: [id number]

## 2011-02-11 NOTE — H&P (Signed)
[name], [name]                         ACCOUNT NO.:  [id number]   MEDICAL RECORD NO.:  [id number]                   PATIENT TYPE:  INP   LOCATION:  0473                                 FACILITY:  [facility]   PHYSICIAN:  [name]. [name], M.D. [facility]           DATE OF BIRTH:  Aug 03, 1933   DATE OF ADMISSION:  04/22/2002  DATE OF DISCHARGE:                                HISTORY & PHYSICAL   CHIEF COMPLAINT:  Cough and weakness, nausea and vomiting.   HISTORY:  [name] is a very nice 77 year old white female, never smoker,  with severe chronic asthma complicated by bronchiectasis and sinusitis.  With no obvious reversible etiology on immunologic workup at [facility] several years ago.  She has been bothered by chronic cough, dyspnea  with exertion and persistent purulent sputum, that did not even clear during  recent hospitalization on  IV antibiotics (see most recent record from  [facility] from Feb 06, 2002 to Feb 10, 2002).  She was found to  have Pseudomonas on sputum culture, sensitive to [place] and Cipro, but never  had reduction in purulent sputum.  Since that time she has failed to thrive  as an outpatient.  She was treated with a full two-week course of  antibiotics, but failed to improve as an outpatient; continues to complain  of anorexia with generalized weakness, nausea and had an episode of vomiting  on April 20, 2002.  Now she just feels bad by which she means she feels  tired, lightheaded when she stands up; no chest pain, fevers, chills or  sweats or increased in baseline dyspnea, however.   I saw her in the office on the day of admission and she looked much  different than usual, that is, not her usual self; but, did not appear toxic  in any way.  Because she has had such purulent sputum and documented drug  intolerance, I am going to recommend replacing her back in the hospital and  treat her as an inpatient; as well as try to work out her GI  complaints and  general failure to thrive with anorexia.   PAST MEDICAL HISTORY:  1. Adrenal insufficiency, documented in previous office records.  2. Diverticulosis, last colonoscopy October 16, 2001.  3. Status post laparoscopic cholecystectomy 1995.  4. Status post upper endoscopy 1995.  5. Most recent colonoscopy at October 16, 2001 with diverticulosis only     noted.  6. Hypertension; with positive family history of late onset heart disease.  7. History of possible TIA in 2000.  8. Hyperlipidemia.  Target LDL less than 100, based on probable peripheral     vascular disease as well as hypertension and family history of late onset     heart disease.   ALLERGIES:  ERYTHROMYCIN, IODINE, SULFA (all intolerant).   MEDICATIONS:  Medications were reviewed in writing with the patient, and  include:  1.  Multivitamins one q.d.  2. Humibid 600 mg b.i.d.  3. Prempro one q.d.  4. Citrucel one teaspoon q.d.  5. Nasonex two puffs q.d.  6. Synthroid 100 mcg q.d.  7. Xalatan one drop q.h.s.  8. Pulmicort four puffs b.i.d.  9. Actonel 35 mg one weekly.  10.      Plavix 75 mg one q.d.  11.      Hyzaar 100/25 mg one q.d.  12.      Prednisone (now at 15 mg q.d.).  13.      K-Dur one b.i.d.  14.      Zantac 150 mg q.h.s.  15.      Metoclopramide 10 mg q.i.d.  16.      Celexa 10 mg 1/2 q.d.  17.      Xanax 0.25 mg one q.h.s.  18.      She is also on Flagyl now empirically because of diarrhea that     occurred after most recent antibiotic therapy.   SOCIAL HISTORY:  She has never smoked.  She is a retired Runner, broadcasting/film/video.  She  denies any alcohol.   FAMILY HISTORY:  Positive for MI in her father at age 47.  Her mother has a  pacemaker, but otherwise healthy.  One sister healthy, except for  hypertension.   REVIEW OF SYSTEMS:  Taking into the account the worksheet, essentially  negative except as noted above.   GYNECOLOGY CARE:  Sherry A. Rosalio Macadamia, M.D.   PHYSICAL EXAMINATION:  GENERAL:  This  is a chronically ill-appearing white  female, who appears older than stated age today.   VITAL SIGNS:  Normal vital signs, except for blood pressure 100/60 with a  pulse rate of 92.   HEENT:  Oropharynx -- Mucosa was dry.  No evidence of thrush.  Ears --  Canals were clear bilaterally.   NECK:  Supple without cervical adenopathy.  No thyromegaly.   LUNGS:  Lung fields revealed coarse rhonchi bilaterally, with poor air  movement.   HEART:  Regular rate and rhythm; without murmurs, rubs or gallops.   ABDOMEN:  Soft, benign, with no palpable organomegaly, tenderness or masses.   EXTREMITIES:  Warm without calf tenderness.  No clubbing, cyanosis or edema.   NEUROLOGIC:  No focal deficits.   SKIN:  Warm and dry.   CHEST X-RAY:  Pending.   LABORATORY DATA:  Pending.   IMPRESSION:  1. Refractory asthmatic bronchitis, with bronchiectasis/acquired mucociliary     dysfunction; and also probable sinusitis, resulting in copious purulent     secretions that are difficult for the patient to cough up.  Add further     to airways inflammation.  I recommended a baseline sputum culture and     then consideration of reinitiation of high dose IV Fortaz and Cipro,     since the most recent Pseudomonas was sensitive to both.  2. General failure to thrive, anorexia; associated with nausea, vomiting,     diarrhea.  She is presently on Flagyl, but continued to have loose stools     and had an episode of vomiting over the weekend.  I am going to add     Reglan empirically to her regimens.  Place her on clear liquids and have     GI see her in consultation.  Will also check stools for C. difficile.  3. History of renal insufficiency.  Presumably related to exogenous steroid     use chronically.  I am going to recommend hydrocortisone  100 mg IV q.6h     to see if some of her symptoms improve with the optimal stress steroid     dosing. 4. Finally, I believe there may be a functional aspect to her  complaints,     that is she is developing depression secondary to chronic illness.  Note,     however, that she was on Celexa 200 mg 1/2 q.d.  Therefore, the diagnosis     of functional illness should be left as a diagnosis of exclusion.                                               [name]. [name], M.D. [facility]    MBW/MEDQ  D:  04/22/2002  T:  04/24/2002  Job:  [id number]

## 2011-02-11 NOTE — Assessment & Plan Note (Signed)
[facility]                             PULMONARY OFFICE NOTE   NAME:[name], [name]                      MRN:          [id number]  DATE:11/27/2006                            DOB:          22-Feb-1933    PULMONARY/EXTENDED POST-HOSPITAL FOLLOWUP OFFICE VISIT   HISTORY:  A 77 year old white female with new onset hypoxemic  respiratory failure in the setting of an acute febrile illness who is  status post hospitalization at [facility] from November 14, 2006  through November 18, 2006, for acute febrile illness associated with  bronchiectasis flare with no specific organism identified and  empirically treated with a combination of Primaxin and high dose Cipro.  She seemed to improve on this regimen and after discharge had marked  improvement in the sputum which had turned from green to white on this  regimen and continued white as long as she was on Cipro. She finished  this about five days ago and now has noticed a slight discolored tinge  to her sputum. She denies any fevers, chills, sweats, orthopnea, PND or  leg swelling.   For full inventory of medications, please see face sheet dated November 27, 2006, noting the patient has tapered herself back down to prednisone at  10 mg one-half daily as instructed.   PHYSICAL EXAMINATION:  She is minimally cushingoid appearing ambulatory  white female in no acute distress. She has stable vital signs.  HEENT: Reveals moderate turbinate edema. No excessive post-nasal  drainage or cobblestoning.  NECK: Supple without cervical adenopathy or tenderness. Trachea is  midline.  LUNGS: Lung fields reveal a few junky expiratory rhonchi only with  overall improvement from previous evaluation.  HEART: Regular rate and rhythm without murmur, gallop or rub.  ABDOMEN: Soft, benign.  EXTREMITIES: Warm without calf tenderness, cyanosis, clubbing or edema.   Hemoglobin saturation is 93% on room air.   IMPRESSION:  1.  Acute hypoxemic respiratory failure has resolved after treatment      with combination therapy with Primaxin and Cipro with no specific      organism identified. I therefore switched her to Cipro as her      cyclical antibiotic at 750 b.i.d. for ten days for definite change      in sputum.  2. She feels she is coming down with a sinus infection but mostly      has nasal obstructive symptoms which would respond to Afrin if she      would use it as per contingency instructions.   This led to a review of each and every one of her contingency  instructions to make sure she understood it correctly and also I  reviewed with her optimal use of Qvar which she approached 75% with  coaching, but was at baseline no more than 50% effective.   Followup will be in six weeks for medication reconciliation and 12 weeks  after that to see me for regular followup.   The only change I made in terms of her maintenance medicine was a switch  to Xopenex to q3 p.r.n. and since the patient  self titrates this  medicine anyway.     [name]. [name], MD, [facility]  Electronically Signed    MBW/MedQ  DD: 11/27/2006  DT: 11/27/2006  Job #: [id number]

## 2011-02-11 NOTE — H&P (Signed)
[name], [name]               ACCOUNT NO.:  [id number]   MEDICAL RECORD NO.:  [id number]          PATIENT TYPE:  INP   LOCATION:  1310                         FACILITY:  [facility]   PHYSICIAN:  Casimiro Needle B. [name], MD, FCCPDATE OF BIRTH:  09[birth year]   DATE OF ADMISSION:  11/14/2006  DATE OF DISCHARGE:                              HISTORY & PHYSICAL   CHIEF COMPLAINT:  Dyspnea and weakness.   HISTORY:  This is a 77-year white female chronically steroid dependent  with chronic sinusitis, asthmatic bronchitis complicated by  bronchiectasis with recurrent purulent tracheobronchitis, and previous  history of resistant Pseudomonas colonization.  She has a prescription  for Augmentin to take on a p.r.n. basis for purulent sputum. She has  previously had clinical response (despite the Pseudomonas culture data),  and initiated this on 02/14 at the onset of her present exacerbation,  but has been going downhill since with increasing symptoms of a hacking  cough productive of green sputum, subjective wheezing, generalized chest  discomfort with no localized pleuritic features; and has already  increased her prednisone to 40 mg per day, but now having an increasing  subjective wheeze and dyspnea.  She had frank hypoxemia on arrival to  the office today.  She also reports poor appetite over the last several  days; and has vomited several times over the last 48 hours; not sure she  is actually keeping her medicines down.  She denies any rigors, but had  a temperature of a 1-or-2-degree temperature several days ago.  I am  not sure what her temperature is running now.  I saw in the office as a  work-in; and she was desaturating on room air, improved with oxygen in  terms of oxygenation, but not tired and somewhat listless; and I  recommended admission to the hospital for IV antibiotic therapy as well  as IV fluids and hydrocortisone, based on her chronic steroid  dependency.   PAST MEDICAL  HISTORY:  1. Diverticulosis status post most recent colonoscopy July2003.  2. Chronic dysphagia, most recent upper endoscopy December2004.  3. Status post laparoscopic cholecystectomy in 1995.  4. Status post transient ischemic attack in 2000 with no recurrence or      sequelae.  5. Chronic sinusitis by Dr. Jearld Fenton.  6. Steroid dependent osteoporosis on Forteo injections daily.  7. Hypertension.   ALLERGIES:  ERYTHROMYCIN, IODINE, and SULFA, all with nonspecific  reactions.   MEDICATIONS INCLUDE:  1. Multivitamins.  2. Vivactil.  3. Synthroid at 100 mcg per day.  4. Citrucel.  5. Nasonex.  6. Xalatan eye drops.  7. Plavix QR 40 two puffs b.i.d.  8. Plavix.  9. Prednisone now at 40 mg daily.  10.KCl 20 mEq b.i.d.  11.Zyrtec q.h.s.  12.Metoprolol 25 mg 1/2 b.i.d.  13.Sular 20 grams daily.  14.Hyzaar 100/25 mg one daily.  15.Forteo injections weekly.  16.Prevacid 15 mg b.i.d.  17.Xopenex 1.25 mg t.i.d.  18.Mucinex p.r.n.   SOCIAL HISTORY:  She has never smoked.  She is a retired Runner, broadcasting/film/video.  Denies any alcohol history.   FAMILY HISTORY:  Positive for MI in  her father at age 75 who may have  had a stroke at age 12.  One sister is healthy except for hypertension.  Multiple myeloma in both maternal and paternal aunts.  Mother is healthy  except for pacemaker requirement.   REVIEW OF SYSTEMS:  Taken in detail, and essentially negative except as  outlined above.   PHYSICAL EXAMINATION:  GENERAL:  This is a chronically, and acutely ill  white female who sits somewhat slumped over in a wheelchair and responds  to questions appropriately, although with somewhat slowed mentation  VITAL SIGNS:  She is afebrile.  Temperature is 99.9 with a pulse rate of  114, blood pressure 120/60, and saturating only 84% on room air and 92%  on 2 liters.  HEENT: Oropharynx was clear and dentition was intact, mucosa was dry.  Ear canals clear bilaterally.  Nasal turbinates reveal moderate edema.   NECK:  Supple without cervical adenopathy.  LUNGS:  Lung fields revealed junky inspiratory and expiratory rhonchi  with nothing localized.  There were no bronchial changes, regular rate  and rhythm.  No increase in P2.  ABDOMEN:  Soft, benign with no palpable organomegaly, masses, or  tenderness.  EXTREMITIES:  Warm without calf tenderness, cyanosis, clubbing, or  edema.  Pedal pulses were strong in the posterior tibial and mid  dorsalis pedis distribution in a asymmetric fashion bilaterally.  NEUROLOGIC:  No focal deficits or pathological deficits.  SKIN EXAM:  Warm, dry with multiple seborrheic keratoses.   LAB DATA:  Pending.   IMPRESSION:  1. Acute purulent tracheobronchitis failing to respond to Augmentin in      a patient previously colonized with Pseudomonas because of problems      with mucociliary dysfunction resulting in chronic sinusitis and      bronchiectasis.  I am going to treat her with a combination of      Primaxin and Cipro, for now, pending culture data.  She will also      need to be covered with stress steroids in the form of      hydrocortisone 100 mg IV q.6 h. based on her chronic steroid      dependency.  2. Asthmatic bronchitis.  Will continue nebulized Xopenex.  3. Dehydration with recent nausea and vomiting.  This should improve      on hydrocortisone, but also will institute IV fluids.  4. Hypothyroid continue Synthroid supplements.  Her most recent TSH      was normal on 100 mcg per day documented 11/08/2006; and,      therefore, we will be continuing the same dose.  5. Hypertension will hold her antihypertensives since she is acutely      ill, today, and restart them tomorrow.      [name]. [name], MD, [facility]  Electronically Signed     MBW/MEDQ  D:  11/14/2006  T:  11/15/2006  Job:  [id number]

## 2011-02-11 NOTE — Discharge Summary (Signed)
NAMEHALA, NARULA                         ACCOUNT NO.:  [id number]   MEDICAL RECORD NO.:  [id number]                   PATIENT TYPE:  INP   LOCATION:  0473                                 FACILITY:  [facility]   PHYSICIAN:  [name]. [name], M.D. [facility]           DATE OF BIRTH:  Nov 11, 1932   DATE OF ADMISSION:  04/22/2002  DATE OF DISCHARGE:  04/25/2002                                 DISCHARGE SUMMARY   IMPRESSION:  1. Nausea and vomiting and diarrhea, unclear etiology, complicated by     hyponatremia, resolved.  2. Severe chronic asthmatic bronchitis, complicated by chronic sinusitis and     bronchiectasis.     A. Pseudomonas aeruginosa, again cultured from sputum this admission but        not treated.  It is sensitive to [place], Primaxin and Cipro.  3. New onset diabetes, documented this admission, perhaps exacerbated by     stress and the use of steroids.  This will be followed up in the     outpatient setting.  4. Documented renal insufficiency previously.  This may have contributed to     the hyponatremia as did the hydrochlorothiazide.  The hydrochlorothiazide     had been stopped.  5. Dysuria.  Urine cultures are negative and post-void residual 45 cc this     admission.  6. Hypertension.   HISTORY:  This is a very complicated 77 year old white female with  refractory chronic asthmatic bronchitis and bronchiectasis previously known  to have Pseudomonas but not improved in terms of purulent sputum production  with prolonged Fortaz and Cipro past hospitalizations.  She came to the  office with refractory nausea and vomiting, diarrhea that developed  relatively indolent in onset over the last several weeks.  She treated  herself with Flagyl at my instruction because I thought she might have a  C.dif colitis.  However, she was  on so many different medications that I  was concerned also about adverse drug effect and I admitted her to the  hospital for evaluation.   Her initial  exam was benign and her lab studies showed no evidence of white  cells in the stool.  The main abnormality was the sodium of 124 with a  glucose of 297.  She was  treated with IV fluids and I simplified her  medication regimen considerably treating her with stress doses of  steroids.   Within 48 hours, she was back to baseline eating a normal diet with no  more GI symptoms and anxious to go home.   She is therefore being discharged in improved condition on a regular diet  although she has been cautioned excess sweets and starches.  Will follow-up  in the office in one week with a B-Met and a hemoglobin A1C at that time for  baseline.   DISCHARGE MEDICATIONS:  Reviewed with the patient at the time of discharge  including  multiple vitamin 1 daily, Prempro 1 daily, Citrucel 1 teaspoon a  day, Nasonex 1 puff b.i.d., Synthroid 1 daily (records indicate this is 100  mcg per day and I have asked her not to change this dose with TSH normal  this admission), Pulmicort 4 puffs b.i.d., Plavix 75 mg 1 daily, prednisone  20 mg 1 b.i.d., for 3 days and then 1 q a.m. thereafter, K-Dur 40 mEq 1  b.i.d., Zantac 150 mg increased to 1 b.i.d., from baseline and Cozaar 100 mg  1 daily in place of Hyzaar, to avoid the  hydrochlorothiazide which may perturb hyponatremia and glucose intolerance,  albuterol per nebulizer 2.5 mg q.i.d., and Glucotrol-XL 2.5 mg 1 daily,  Reglan 10 mg before meals and at bedtime.   I will see the patient back in one week for follow-up.                                                [name]. [name], M.D. [facility]    MBW/MEDQ  D:  04/25/2002  T:  04/30/2002  Job:  [id number]

## 2011-02-11 NOTE — Discharge Summary (Signed)
[name], FAIT                         ACCOUNT NO.:  [id number]   MEDICAL RECORD NO.:  [id number]                   PATIENT TYPE:  INP   LOCATION:  5506                                 FACILITY:  [facility]   PHYSICIAN:  Casimiro Needle B. [name], M.D. [facility]           DATE OF BIRTH:  1933-02-28   DATE OF ADMISSION:  12/22/2002  DATE OF DISCHARGE:  12/26/2002                                 DISCHARGE SUMMARY   FINAL DIAGNOSES:  1. Syncope consistent with a vasovagal episode.     a. No evidence of any arrhythmias this admission.     b. Suppressed TSH on Synthroid 112 mcg per day suggesting mild        hyperthyroidism.  Synthroid now on hold pending outpatient followup.  2. Severe purulent tracheobronchitis in a patient with underlying     bronchiectasis/sinusitis and previous colonization with resistant     organism.     a. Treated with 4 days of intravenous antibiotic therapy to complete 6        more days of Maxipime and meropenum as an outpatient.  3. Hypertension with no evidence of orthostasis on present regimen.  4. Hypothyroidism, apparently on excessive Synthroid dose therefore being     held at time of discharge.   HISTORY:  Please see dictated H&P by Dr. Sung Amabile dated 12/22/2002.  The  patient was admitted after passing out in church with no arrhythmias  documented at the time of arrival by paramedics although she was felt to be  somewhat bradycardic with an irregular pulse at the time of arrival by the  paramedics.  Subsequent to the paramedics arrival she has not had any  suggestion of arrhythmia at all.   Her workup here included an echocardiogram that showed minimal increase in  LV thickness, a TSH level of 0.165 with lower limits of normal being 0.35  suggesting possible hypothyroidism on therapy and negative cardiac enzyme  workup.   She did have very purulent sputum at the time of admission and sputum was  cultured showing gram-negative rods but not yet final.  We took  this  opportunity to treat her aggressively with combination therapy with  meropenem and Maxipime IV, and she is now on day 4 of 10 planned therapy  with 6 more days (12 more doses) to be arranged as an outpatient.   Her sputum has turned completely white and she feels good and ready to go  home.  She is ambulatory without any arrhythmias, eating well, with no  exertional or pleuritic chest pain or further episodes of syncope or  orthostatic changes by blood pressure monitoring.   The patient will be discharged to follow up in the office in 6 days.  I have  amended her maintenance medications to delete Synthroid for now so that her  medications will read: Prempro one daily, Citrucel one teaspoon daily,  Nasonex two sprays b.i.d., Xalatan eye drops  one at bedtime, Pulmicort four  puffs b.i.d., Plavix 75 mg daily, prednisone 20 mg per day to be tapered  down to 5 mg per day maintenance, K-Dur 20 mEq one b.i.d., Cozaar 100 mg  daily, albuterol 2.5 mg q.i.d., Clarinex 5 mg daily, Actonel 35 mg weekly.   P.r.n. she can use Xanax 0.5 mg q.4h. and the antibiotics have been arranged  through home health nurse.   Her condition at time of discharge is markedly improved and followup will be  in less than a week with a TSH level.  We will also check the final sputum  culture when available.                                               [name]. [name], M.D. [facility]    MBW/MEDQ  D:  12/26/2002  T:  12/27/2002  Job:  [id number]

## 2011-02-11 NOTE — Assessment & Plan Note (Signed)
[facility]                             PULMONARY OFFICE NOTE   NAME:[name], [name]                      MRN:          [id number]  DATE:01/08/2007                            DOB:          05-Oct-1932    HISTORY OF PRESENT ILLNESS:  The patient is a 77 year old white female  patient of Dr. [name] who has a known history of severe chronic asthma  with rhinitis, sinusitis, and bronchiectasis.  She presents for a 2-week  followup.  At last visit, the patient had lab work, which showed an  elevated total cholesterol of 223, an LDL of 140, and an HDL of 67.  The  patient's LDL goal was less than 100, preferably below 70 with her  history of previous TIA.  The patient returns today with all of her  medications in today for review, which are correct with our medication  list.  The patient denies any chest pain, shortness of breath,  orthopnea, PND, or leg swelling.   PAST MEDICAL HISTORY:  Reviewed.   CURRENT MEDICATIONS:  Reviewed.   PHYSICAL EXAM:  The patient is a pleasant female in no acute distress.  She is afebrile with stable vital signs.  O2 saturation is 94% on room  air.  Weight is at 132.  HEENT:  Unremarkable.  NECK:  Supple without cervical adenopathy.  LUNGS:  Sounds reveal coarse rhonchi bilaterally.  CARDIAC:  Regular rate and rhythm.  ABDOMEN:  Soft and nontender.  EXTREMITIES:  Warm without any edema.   IMPRESSION AND PLAN:  1. Hyperlipidemia with an LDL goal of less than 70-100.  The patient      is currently not optimally controlled.  I have recommended that she      begin Statin therapy.  However, the patient wanted to hold off at      this time and work aggressively on diet and exercise in hopes of      getting close to goal without being placed on additional      medications.  Note, the patient is on over 20 different medications      daily.  The patient will recheck here in 3 months for fasting labs.  2. Osteoporosis, currently  on Forteo, started in October of 2006.  The      patient will be completed with her 2 years in October of this year,      and we will repeat a bone density at that time.  The patient has      previously been intolerant to calcium supplements in the past due      to gastrointestinal side effects.  However, we will hopefully be      able to restart these and begin with Os-Cal D twice daily, and we      will check a vitamin D level at today's visit.  We will follow up      accordingly.  3. Complex medication regimen.  The patient's medications reviewed in      detail.  Patient      education is provided.  A computerized medication  calendar was      completed for this patient and was reviewed in detail.      [name], NP  Electronically Signed      [name]. [name], MD, [facility]  Electronically Signed   TP/MedQ  DD: 01/10/2007  DT: 01/10/2007  Job #: [id number]

## 2011-02-11 NOTE — Assessment & Plan Note (Signed)
[facility]                             PULMONARY OFFICE NOTE   NAME:[name], [name]                      MRN:          [id number]  DATE:12/26/2006                            DOB:          1933/09/25    HISTORY:  PRIMARY SERVICE COMPREHENSIVE HEALTHCARE EVALUATION   A 77 year old white female with severe chronic asthma with rhinitis,  sinusitis and bronchiectasis, with frequent exacerbations with purulent  sputum, but no specific organism identified.  Most recently she has done  reasonable well on Cipro cycles with one hospitalization required in the  last 6 months for refractory purulent sputum, but again no specific  organism was identified.  She has improved to the point were she is able  to get on her exercise bike and work out for 15-17 minutes daily, albeit  at a slow pace.  She denies any present exacerbation, purulent sputum or  dyspnea over baseline, fevers, chills, sweats, chest pain or unintended  weight loss.   PAST MEDICAL HISTORY:  1. Diverticulosis status post most recent colonoscopy July 2003.  2. Chronic dysphagia, most recent upper endoscopy December 2004.  3. Status post laparoscopic cholecystectomy in 1995.  4. Status post TIA in 2000 with no recurrence.  5. Chronic sinusitis followed by Dr. Jearld Fenton.  6. Steroid induced osteoporosis, on Forteo injections and she cannot      tolerate bisphosphonates.  7. Hypertension.   ALLERGIES:  AZITHROMYCIN, IODINE, SULFA ALL WITH NONSPECIFIC REACTIONS.   MEDICATIONS:  Taken in full in detail on the work sheet, corrected as  listed in the column dated December 26, 2006 and correlates with her  medication calendar 100%.   SOCIAL HISTORY:  She has never smoked.  She is a retired Runner, broadcasting/film/video, denies  any alcohol.   FAMILY HISTORY:  Positive for a myocardial infarction in her father at  age 30 who may have had a stroke as early as age 87.  One sister who is  healthy, except for hypertension.   Multiple myeloma in maternal and  paternal aunts is present.  Mother is healthy, except for pacemaker  requirement.   REVIEW OF SYSTEMS:  Taken in detail on the work sheet, negative except  as outlined above, except for mild arthritis in her back and hands.   PHYSICAL EXAMINATION:  This is a stoic, ambulatory white female in no  acute distress.  She had stable vital signs.  HEENT:  Remarkable for nasal turbinate with moderate nonspecific  features, limited ocular exam was benign, ear canals clear bilaterally.  NECK:  Supple without cervical adenopathy, tenderness; carotid upstrokes  brisk with no bruits.  CHEST:  Revealed expiratory greater than inspiratory rhonchi which is  sonorous in nature but no true wheezes.  She had, had a junky cough on  FVC maneuver.  Regular rate and rhythm without murmur, gallop, rub or  increase in P2.  ABDOMEN:  Soft, benign, no palpable organomegaly, mass or tenderness.  Femoral pulses were present bilaterally, no bruits.  EXTREMITIES:  Warm without calf tenderness, cyanosis, clubbing or edema.  Posterior tibia was stronger than dorsalis pedis, left was  greater than  right in a slightly asymmetric fashion.  NEUROLOGIC:  No focal deficit or pathologic reflex.  Her sensorium  was  very sharp and she had no evidence of a tremor or any obvious sensory  motor deficits.  SKIN:  Warm and dry with no lesions.   Chest x-ray was unchanged from previous studies showing increased  markings, especially in the bases.   Hemoglobin saturation at 94% on room air.   LABORATORY DATA:  CBC was normal, chemistry profile was normal.  Total  cholesterol was 223 with an LDL of 140, HDL was 67, TSH was 1.6.   IMPRESSION:  1. Servere chronic asthmatic bronchitis, steroid dependant, albeit at      near physiologic dose of prednisone of 5 mg, one half daily, while      maintained on Qvar 80 mg 2 puffs b.i.d.  There is really nothing      else to offer here other than to  review contingencies for this      patient, which were all outlined on the medication calendar and      reviewed with her in detail today.  2. Intermittent purulent sputum now seems better controlled with short      courses of high dose Cipro on a empiric basis since we do not have      a specific bug identified.  3. Osteoporosis now maintained on Forteo since October 2006 with no      change in height.  4. Hypertension adequately controlled on present regimen, which      includes a thiazide diuretic and should help calcium balance.  5. General health maintenance.  She was updated with tetanus in 2005,      Pneumovax 2004.  Dr. Rosalio Macadamia does her gyn  care.  6. Hypothyroidism, on adequate replacement.  7. Mild hyperlipidemia with previous TIA.  She is on Plavix, but I am      concerned about the LDL trending upward and we will discuss this      issue with her on her next visit (previous level was 109, which is      although still not at target much more acceptable than 140).  We      will discuss initiating statin therapy on her next visit.     [name]. [name], MD, [facility]  Electronically Signed    MBW/MedQ  DD: 12/27/2006  DT: 12/27/2006  Job #: [id number]

## 2011-02-11 NOTE — Discharge Summary (Signed)
[name], [name]               ACCOUNT NO.:  [id number]   MEDICAL RECORD NO.:  [id number]          PATIENT TYPE:  INP   LOCATION:  1433                         FACILITY:  [facility]   PHYSICIAN:  [name]. [name], MD, FCCPDATE OF BIRTH:  [birth year] 02, 1933   DATE OF ADMISSION:  07/02/2008  DATE OF DISCHARGE:  07/04/2008                               DISCHARGE SUMMARY   FINAL DIAGNOSES:  1. Febrile illness post op, pneumonia vs wound infection      a.     Final cultures pending.      b.     CT abdomen showing no evidence of an abscess dated July 04, 2008 and cleared by Dr. Donell Beers for discharge.  2. Severe bronchiectasis, steroid-dependent asthmatic bronchitis,      chronic purulent tracheobronchitis  3. Hypothyroidism.  4. Adrenal insufficiency secondary to chronic steroid dependency      treated with stress steroids this admission.  5. Hypertension.   HISTORY:  Please see H and P.  The patient was admitted acutely ill with  nausea, vomiting and shaking chills with fever to 102.  Blood cultures  returned 1 out of 2 positive for staph with wound culture also positive  MRSA but with minimal discoloration and drainage.  Her chest x-ray  showed no change in chronic pattern, and she had not had any increase in  respiratory symptoms over baseline, although these symptoms are quite  severe chronically (with persistent purulent sputum production on a  daily basis due to bronchiectasis).   On admission she reported nausea and vomiting but no abdominal pain and  dramatically improved once empiric antibiotics were started, including  vancomycin Avelox and Zosyn.  She was also given stress steroids.   At the time of discharge she was eating normally, feeling completely  back to baseline every way with complete resolution of nausea and no  reported abdominal pain.  She was cleared for discharge by Dr. Donell Beers  after CT scan of the abdomen failed to reveal any evidence of an  abscess.   Since  we do not know whether the staph  is methicillin-sensitive or not,  she will be discharged on vancomycin and five more days of Avelox in  addition to all of her routine medications which include amlodipine 2.5  mg daily, Hyzaar 100/25 one daily, Prevacid 15 mg one b.i.d., Zyflo 600  mg daily, calcium with D 600 mg b.i.d., vitamin D 50,000 units weekly.  Advil Cold and Sinus as needed, Mucinex DM 2 b.i.d. p.r.n., alprazolam  0.25 mg q.6h. p.r.n. Advil 12 mg 1-2 q.6h. p.r.n., Pepcid 0.10 mg q.h.s.  Multivites 1 tablet daily. Estradiol Tuesdays and Thursdays. Synthroid  125 mcg daily, Citrucel 1 tablespoon daily. Fluticasone nasal spray  b.i.d., Xalatan eye drops 1 drop OU at bedtime. Qvar 2 puffs b.i.d.,  Plavix 75 mg daily, prednisone 10 mg tablets 1/2 daily, potassium 20 mEq  twice daily, Zyrtec 10 mg daily and bisoprolol 5 mg 1/2 daily.   She was discharged in improved condition and will continue to receive 5  full days of  Avelox and vancomycin through Monday the 12th, at which  time we will check her final cultures and see if she can be switched  over to oral therapy.  She is to receive vancomycin IV per our pharmacy  instructions and advanced.   She is to maintain her normal diet and activity with follow-up in the  office within a week and also followup with Dr. Colin Benton within the next  week to look at her wound again.      [name]. [name], MD, [facility]  Electronically Signed     MBW/MEDQ  D:  07/05/2008  T:  07/06/2008  Job:  [id number]   cc:   Alfonse Ras, MD  [address], [address]  [place]  [place] [address]

## 2011-02-11 NOTE — H&P (Signed)
[name], [name]                         ACCOUNT NO.:  [id number]   MEDICAL RECORD NO.:  [id number]                   PATIENT TYPE:  INP   LOCATION:  5506                                 FACILITY:  [facility]   PHYSICIAN:  Oley Balm. Sung Amabile, M.D. [facility]          DATE OF BIRTH:  11-06-1932   DATE OF ADMISSION:  12/22/2002  DATE OF DISCHARGE:                                HISTORY & PHYSICAL   ADMISSION DIAGNOSIS:  Syncope.   HISTORY OF PRESENT ILLNESS:  The patient is a 77 year old woman who is  followed by Dr. [name] for bronchiectasis and general medical problems.  She  was in her usual state of health this morning until she went to church. She  started feeling funny and is unable to characterize this further. She  stood up to leave the service and as she walked out, suffered a syncopal  episode. She came to immediately. EMS was dispatched and she was transferred  to the emergency department at [facility]. She denies any  antecedent palpitations or chest pain.  She denies upper or lower extremity  weakness. She had no nausea, vomiting, diarrhea, or dysuria.  At the present  time, she feels like she is essentially back to her prior baseline health.  However, she does note that she has recently increased her prednisone dose  which she takes for bronchiectasis. She increased this due to sinus and  chest congestion.  She has not been on antibiotics recently.   PAST MEDICAL HISTORY:  1. Bronchiectasis.  2. Chronic sinusitis.  3. Hypothyroidism.  4. Hypertension.  5. History of transient ischemic attacks in 2000.   MEDICATIONS:  1. Synthroid 112 mcg p.o. q.a.m.  2. Cozaar - unknown dose.  3. Plavix 75 mg p.o. daily.  4. Prempro.  5. Prednisone 5 mg p.o. daily - recently increased to 40 mg a day and     tapered back to her baseline dose of 5 mg.  6. Albuterol 2.5 mg nebulized t.i.d.   SOCIAL HISTORY:  She is a nonsmoker.  No significant occupational exposures.   FAMILY  HISTORY:  Noncontributory.   REVIEW OF SYSTEMS:  As per the history of present illness.   PHYSICAL EXAMINATION:  VITAL SIGNS: Temperature 99.1, blood pressure 150/80,  pulse 70 and regular, respirations 16 and unlabored.  Room air oxygen  saturation is normal.  GENERAL:  She is well-developed, well-nourished, in no acute cardiac or  respiratory distress.  HEENT:  Her right tympanic canal to be partially occluded with cerumen. Left  tympanic ear canal is patent and her left tympanic membrane appears  sclerotic. Oropharynx is benign. Cranial nerves are intact.  NECK:  Supple without adenopathy or jugular venous distention.  CHEST:  Normal percussion throughout.  Breath sounds are full and coarse  with scattered wheezes.  No findings of consolidation are noted.  HEART:  Regular rate and rhythm with no murmurs.  ABDOMEN:  Soft with normal bowel sounds.  EXTREMITIES:  Without cyanosis, clubbing, or edema.  NEUROLOGY:  Motor and sensory to be intact. Deep tendon reflexes are 1+ and  symmetric.   LABORATORY DATA:  EKG reveals no acute abnormalities.  CT scan of the head  reportedly demonstrates some chronic-appearing changes consistent with a  basal ganglia lacunar infarct on the right with no acute disease.  Chest x-  ray has not been obtained.   Normal chemistries except a sodium slightly low at 134.  The pH and pCO2 are  normal.  CBC reveals an elevated white count at 23,000.  Cardiac enzymes are  pending.   IMPRESSION:  1. Bronchiectasis with mild acute exacerbation - this is an incidental     issues, but probably needs to be treated with antibiotics now that she is     in the hospital.  2. Syncope of unclear etiology.   PLAN:  She will be admitted to telemetry and ruled out for myocardial  infarction. Two-dimensional echocardiogram will be obtained.  She will be  treated with Meropenem as she has had resistant Pseudomonas in the past. She  will be supported with Solu-Medrol and  nebulized bronchodilators for the  bronchospastic component of her airways disease.                                               Oley Balm Sung Amabile, M.D. [facility]    DBS/MEDQ  D:  12/22/2002  T:  12/23/2002  Job:  [id number]   cc:   [name]. [name], M.D. [facility]

## 2011-02-11 NOTE — Assessment & Plan Note (Signed)
[facility]                               PULMONARY OFFICE NOTE   NAME:[name], [name]                      MRN:          [id number]  DATE:07/14/2006                            DOB:          07/25/1933    PRIMARY SERVICE/FOLLOWUP OFFICE VISIT   HISTORY:  A 77 year old white female with bronchiectasis and severe chronic  asthmatic bronchitis who has failed multiple regimens and now returns having  not been impressed that Symbicort at a dose of 160/4.5 two puffs b.i.d. has  done anything for her.  She has reduced her albuterol down to once a day and  prednisone down to 5 mg per day but continues to have frequent flare ups  with congestive cough productive of intermittently purulent sputum for which  she uses cycles of Augmentin and Avelox interchangeably.  She denies any  pleuritic pain, fevers, chills, sweats, orthopnea, PND.   PHYSICAL EXAMINATION:  GENERAL:  She is a pleasant, ambulatory white female  in no acute distress.  VITAL SIGNS:  She is afebrile, stable vital signs, except for a blood  pressure only 90/60.  HEENT:  Unremarkable.  Oropharynx is clear.  LUNGS:  Lung fields reveal junky panexpiratory rhonchi bilaterally which  improve with purse-lip.  HEART:  There is a regular rate and rhythm without murmur, gallop, or rub.  ABDOMEN:  Soft, benign.  EXTREMITIES:  Warm without calf tenderness, cyanosis, clubbing, or edema.   IMPRESSION:  1. Bronchiectasis/asthmatic bronchitis, not symptomatically improving on      Symbicort.  At this point, I have asked her to stop the Symbicort and      use the need for albuterol and prednisone as a barometer of airways      control, with a low threshold to restart them if she notices a      significant increase in either therapy.  2. Hypertension is over-controlled at present.  I recommended reducing      Sular down if not off based on the concern that because it is a calcium-      channel blocker it  may interfere with lower esophageal sphincter tone      and contribute to reflux, which is always an issue in chronic coughers      with bronchiectasis.  Followup will be in 6 weeks, sooner if needed.   A flu shot was given today.  Followup in 6 weeks, sooner if needed.            ______________________________  [name] [name], MD, [facility]    MBW/MedQ  DD:  07/14/2006  DT:  07/17/2006  Job #:  [id number]

## 2011-02-11 NOTE — Assessment & Plan Note (Signed)
[facility]                               PULMONARY OFFICE NOTE   NAME:[name], [name]                      MRN:          [id number]  DATE:05/17/2006                            DOB:          1933/06/24    HISTORY OF PRESENT ILLNESS:  This is a 77 year old white female with  bronchiectasis/asthmatic bronchitis, previously intolerant of Serevent, but  having to use frequent bursts of prednisone, superimposed on __________ 5 mg  per day for symptomatic wheezing with dyspnea with exertion.  She returns  today for follow up having recently tapered prednisone back down.   For a full inventory of medication, see column dated May 17, 2006, for  extremely complicated medical regimen for which she carries a medication  calendar, which corresponds 100% to the calendar.   PHYSICAL EXAMINATION:  GENERAL APPEARANCE:  Pleasant, ambulatory white  female in no acute distress  VITAL SIGNS:  Stable.  Weight 126 pounds.  No change from baseline.  HEENT:  Unremarkable.  NECK:  Clear.  LUNGS:  Lung fields revealed late expiratory wheeze bilaterally.  HEART:  Regular rhythm without murmurs, gallops, rubs.  ABDOMEN:  Soft, benign without palpable organomegaly, masses or tenderness.  EXTREMITIES:  Warm without calf tenderness, cyanosis, clubbing or edema.   STUDIES:  Room air saturation 91% on room air.   IMPRESSION:  Poorly controlled asthmatic bronchitis/bronchiectasis.  At this  point, she has failed Serevent, Xolair and Advair.  I thought it would be  worthwhile trying Symbicort 160/4.5 two puffs b.i.d. and spent extra time  teaching her optimal MDI technique in the office which she mastered to  approximately 90% effectiveness.   FOLLOWUP:  Follow up will be in three months, sooner if needed.  This  patient is exceptionally insightful in terms of self management and does  carry a calendar that reflects both her maintenance and contingency  medicines in  unambiguous and user-friendly details which I have asked her to  follow in the interim, and we will see her p.r.n. in the meantime.                                   [name]. [name], MD, [facility]   MBW/MedQ  DD:  05/18/2006  DT:  05/18/2006  Job #:  [id number]

## 2011-02-11 NOTE — Assessment & Plan Note (Signed)
[name]                             PULMONARY OFFICE NOTE   NAME:[name], [name]                      MRN:          [id number]  DATE:11/08/2006                            DOB:          11-14-1932    This is a 77 year old white female with severe acquired mucociliary  dysfunction, extensively evaluated both at [facility] and by Dr. Lucie Leather and  previously treated with Xolair with no apparent benefit.  She returns  today with a new medication calendar that was reviewed with her in  detail, doing fairly well but has been ad libbing a bit on her nebulizer  therapy and just typically uses Xopenex and combination of Atrovent  twice a day rather than the three times a day as recommended.  She  thinks she needs a different dose of Synthroid because she feels  fatigued, but denies any change in bowel or bladder habits or heat or  cold intolerance.   Note that she has been maintained on prednisone at 10 mg one half daily  with no recent increase in prednisone need or Xopenex.   She has been controlling purulent sputum with p.r.n. Augmentin  effectively and I reviewed each and every one of the maintenance  medicines versus p.r.n.'s from the medication calendar column dated  November 08, 2006.   PHYSICAL EXAMINATION:  She is a pleasant, ambulatory white female in  acute distress.  She does not appear cushingoid.  She has stable vital signs.  HEENT:  Unremarkable.  Pharynx clear.  LUNGS:  Lung fields reveal junky rhonchi bilaterally, inspiratory and  expiratory in nature.  HEART:  Regular rhythm without murmur, gallop, or rub.  ABDOMEN:  Soft, benign.  EXTREMITIES:  Warm without calf tenderness, cyanosis, or clubbing.   IMPRESSION:  Chronic asthmatic bronchitis/rhinitis/sinusitis all related  to poor mucociliary function with no specific diagnosis ever made at  [facility] or by Dr. Lucie Leather other than underlying allergies that have not  been controlled either  on shots or Xolair to date.   There is really nothing else to offer here.  I do not believe Atrovent  has helped much and since she is an ever smoker, I am going to delete it  from the regimen and move the mountain to [facility] by asking her to  use the nebulizer with Xopenex twice daily with q.3 p.r.n. dosing rather  than continue to list the nebulizer and then worry she does not take it.  I have asked her to show this sheet to every physician she sees for  further input as needed.  Follow-up here will be every three months,  sooner if needed.   Fatigue is probably not due to inadequate thyroid dosing, but I did  recommend she continue the Synthroid at 100 mcg per day pending repeat  TSH today.     [name]. [name], MD, [facility]  Electronically Signed    MBW/MedQ  DD: 11/08/2006  DT: 11/08/2006  Job #: [id number]

## 2011-02-11 NOTE — Assessment & Plan Note (Signed)
HEALTHCARE                               PULMONARY OFFICE NOTE   NAME:[name], [name]                      MRN:          [id number]  DATE:08/04/2006                            DOB:          [birth year]    PULMONARY/POST HOSPITAL FOLLOWUP VISIT   HISTORY:  A 77 year old white female with chronic asthmatic bronchitis and  bronchiectasis with sinusitis admitted to [facility] October 26 through  July 27, 2006, with an acute purulent exacerbation associated with low-  grade fever and nausea with poor p.o. intake.  She did not have any definite  evidence of airspace disease but was treated as a pneumonia with 7 days of  Maxipime along with fluoroquinolones and proved to have a pseudomonas that  was sensitive to fluoroquinolones that grew out on culture.  However, at  home given 7 more days of Levaquin her sputum is beginning to become yellow  again.  Note that she had a great deal of difficulty maintaining an IV and  still has pain where her PICC line was placed on the left but no arm  swelling.   For full inventory of medications please see face sheet, dated August 04, 2006, noting that her Synthroid was adjusted to 100 mcg daily because of TSH  of 6.6 using 100 alternating with 88 dosing prior to admission.   She has been able to take her prednisone successfully down to 10 mg one-half  daily and has taken her last dose of Levaquin today.   PHYSICAL EXAMINATION:  GENERAL:  She is a minimally cushingoid, ambulatory  white female in no acute distress.  She had normal vital signs except for a  blood pressure of 90/60, pulse rate is 89.  HEENT:  Unremarkable, pharynx clear.  NECK:  Supple without cervical adenopathy or tenderness.  Trachea is  midline, no thyromegaly.  LUNG FIELDS:  Reveal diminished breath sounds bilaterally with moderate  increased expiratory time and junky rhonchi.  HEART:  There is regular rate and rhythm without murmur,  gallop, rub.  ABDOMEN:  Soft, benign.  EXTREMITIES:  Warm without calf tenderness, cyanosis, clubbing, or edema.   Hemoglobin saturation 92% on room air.   IMPRESSION:  Chronic asthmatic bronchitis associated with sinusitis,  bronchiectasis, and recurrent purulent sputum despite taking Levaquin to  which pseudomonas was sensitive.  This presents a problem as it has in the  past since this patient, although did very well on Maxipime, does not  tolerate IV injections well.  For now I recommended a drug holiday and  then the next time she has a definite change toward worsening sputum to take  a 10-day course of Augmentin, and then perhaps go back to Levaquin, Avelox,  or high-dose Cipro with the option of putting her  back in the hospital and/or trying to do outpatient therapy, although this  may be problematic as noted above.   FOLLOWUP:  Will be in 6 weeks for medication reconciliation purposes.  We  will see her sooner if needed.    ______________________________  [name] [name], MD, FCCP  MBW/MedQ  DD: 08/04/2006  DT: 08/04/2006  Job #: [id number]

## 2011-02-11 NOTE — Op Note (Signed)
NAMETOINI, FAILLA                         ACCOUNT NO.:  [id number]   MEDICAL RECORD NO.:  [id number]                   PATIENT TYPE:  AMB   LOCATION:  DSC                                  FACILITY:  [facility]   PHYSICIAN:  Suzanna Obey, M.D.                    DATE OF BIRTH:  January 22, 1933   DATE OF PROCEDURE:  12/04/2003  DATE OF DISCHARGE:                                 OPERATIVE REPORT   PREOPERATIVE DIAGNOSIS:  Chronic sinusitis.   POSTOPERATIVE DIAGNOSIS:  Chronic sinusitis.   SURGICAL PROCEDURE:  Left maxillary antrostomy with stripping, left  sphenoidotomy, left frontal sinusotomy, left ethmoidectomy, right  sphenoidotomy, InstaTrak computer guidance.   ANESTHESIA:  General endotracheal tube.   ESTIMATED BLOOD LOSS:  Approximately 10 mL.   INDICATIONS FOR PROCEDURE:  This is a 77 year old who has had a chronic  problem with sinusitis and has had multiple operations in the past but has  evidence that she may have fungal sinusitis, particularly on the left side.  There is a complete opacification of maxillary sinus with calcified  speckling that is suggestive.  She also has examination where it does look  like there is fundus coming out of the maxillary sinus.  There is also  frontal opacification as well as sphenoid opacification on the left side and  right sphenoid thickening.  The patient was informed of the risks and  benefits of the procedure including bleeding, infection, scarring, CSF leak,  change in the sense of smell, blindness, and risks of the anesthetic.  All  questions were answered and consent was obtained.   OPERATION:  The patient was taken to the operating room and placed in the  supine position.  After adequate general endotracheal anesthesia, was placed  in a supine position, prepped and draped in the usual sterile fashion.  The  InstaTrak helmet was positioned and calibrated.  The middle turbinates were  injected with 1% lidocaine with 1:100,000  epinephrine bilaterally.  The left  side was begun using the InstaTrak to guide into the maxillary sinus where a  significant amount of peanut butter colored material that was suctioned out  of the sinus that did appear to be a fungus.  This was cleaned out through  the antrostomy which was opened more widely with the microdebrider.  The  nasal antral window that had been placed previously also had a significant  amount of debris coming out of it which was all fungal appearing and  removed.  The microdebrider was placed into the sinus and some of the very  thickened mucosa was debrided out.  The direction was then carried into the  ethmoid where there was some purulent material coming from just underneath  the uncinate process which had never been removed even in previous  operations.  This was microdebrided away and the polypoid material was also  debrided.  There was one small cell  in the ethmoid that had some fungus  material in it.  The frontal sinus was then opened which was occluded with  polypoid material.  Once opened, there was fungal material in the frontal  sinus which was removed with curved suction and InstaTrak guidance was used  for performing this operation.  Then, the sphenoid sinus also had fungal  material in it which was cleaned out with curved suction.  The inferior  aspect of the sphenoid sinus was opened more widely with a microdebrider.  The fungal material was all cleaned out and appeared to be completely  removed.  Again, InstaTrak was used for this procedure.  A pledget was  placed into this side.  The right sphenoid was explored, there was polypoid  material around the opening and it was opened widely with the microdebrider.  The sphenoid was then explored and there was no fungus material on this  side, there was just a thickened mucosa.  A pledget was placed.  The  pledgets were removed and Kennedy packs soaked in Bacitracin were placed  into the nose  bilaterally and tied  loosely across the columella.  The oral cavity, oropharynx, and nasopharynx  were suctioned out of all blood and debris and the InstaTrak helmet was  removed.  The patient was awakened and brought to the recovery room in  stable condition.  Counts were correct.                                               Suzanna Obey, M.D.    Cordelia Pen  D:  12/04/2003  T:  12/04/2003  Job:  [id number]   cc:   Casimiro Needle B. [name], M.D. [facility]

## 2011-02-11 NOTE — Assessment & Plan Note (Signed)
[facility]                               PULMONARY OFFICE NOTE   NAME:[name], [name]                      MRN:          [id number]  DATE:08/10/2006                            DOB:          [birth year]    HISTORY OF PRESENT ILLNESS:  Patient is a 77 year old white female patient  of Dr. Thurston Hole who has a known history of chronic asthmatic bronchitis and  bronchiectasis with chronic sinusitis.  Presents for an acute office visit.  Patient complained of a three day history of nasal congestion, productive  cough with thick green sputum and wheezing.  The patient has recently been  hospitalized from October 26 through November 1 for an asthmatic,  bronchitic, and bronchiectatic exacerbation.  Was treated with prolonged IV  antibiotics, including Mazepine along with fluoroquinolone.  Patient's  sputum was positive for Pseudomonas and was sensitive to Levaquin.   The patient denies any hemoptysis, orthopnea, or leg swelling.   PAST MEDICAL HISTORY:  Review.   CURRENT MEDICATIONS:  Review.   PHYSICAL EXAMINATION:  GENERAL:  Patient is a chronically ill-appearing  female in no acute distress.  VITAL SIGNS:  She is afebrile with stable vital signs.  O2 saturation is 94%  on room air.  HEENT:  Nasal mucosa with some mild erythema.  Nontender sinuses to  percussions.  NECK:  Supple without adenopathy.  No JVD.  LUNGS:  The lung sounds reveal coarse breath sounds with some bibasilar  crackles.  CARDIAC:  Regular rate and rhythm.  ABDOMEN:  Soft, benign.  EXTREMITIES:  Warm without any edema.   IMPRESSION:  Acute asthmatic bronchitis and bronchiectatic exacerbation.  Patient is to begin Augmentin x10 days.  Continue on her Mucinex twice daily  along with her nasal hygiene regimen.  Patient is to back to Dr. [name] as  scheduled or sooner if needed.      [name], NP  Electronically Signed      [name]. [name], MD, [facility]  Electronically  Signed   TP/MedQ  DD: 08/10/2006  DT: 08/10/2006  Job #: [id number]

## 2011-02-11 NOTE — H&P (Signed)
[name], [name]               ACCOUNT NO.:  [id number]   MEDICAL RECORD NO.:  [id number]          PATIENT TYPE:  INP   LOCATION:  1413                         FACILITY:  [facility]   PHYSICIAN:  Casimiro Needle B. [name], MD, FCCPDATE OF BIRTH:  [birth year]   DATE OF ADMISSION:  07/21/2006  DATE OF DISCHARGE:                                HISTORY & PHYSICAL   CHIEF COMPLAINT:  Fever, cough, and dyspnea.   HISTORY:  A 77 year old white female with severe chronic asthma complicated  by bronchiectasis and chronic sinusitis with an extensive negative workup at  [facility] by Dr. Hoyle Barr for immunodeficiency and having failed  to respond to both immunotherapy and Xolair for what is perceived to be  significant chronic allergic asthma and rhinitis.  Her course has been  complicated by intermittent exacerbations with purulent sputum with  previously documented gram negative rods, which were multiple drug  resistance but paradoxically has done well with short courses of Augmentin  and Cipro, which reduced the appearance of the sputum but never totally  eradicated the discolored sputum.  She comes in with her first exacerbation  in several years that has not responded to treatment with seven day history  of worsening cough productive of thick, green sputum, fever but no rigors,  and progressive dyspnea at rest.  She came to the office and was found to be  saturated on room air with very poor p.o. intake over the last 24 hours;  therefore, is felt to require admission.   Note that the patient has required chronic steroids and does have a history  of adrenal insufficiency documented remotely and therefore is maintained on  low doses of prednisone, even when her breathing is better.  She does have  bilateral chest discomfort during coughing paroxysms and says she has been  coughing so hard she cannot eat anymore, but not actually vomiting.  Denies any diarrhea, myalgias, arthralgias, active  sinus or reflux symptoms.   PAST MEDICAL HISTORY:  1. Diverticulosis, status post most recent colonoscopy in July, 2003.  2. GERD, status post most recent upper endoscopy, December, 2004.  3. GERD with dysphagia.  4. Status post laparoscopic cholecystectomy in 1995.  5. Status post TIA in 2000 with no recurrence.  6. History of chronic sinusitis by Dr. Jearld Fenton.  7. Steroid-induced osteoporosis on Forteo injections.  8. Hypertension.  9. Hypothyroidism.   ALLERGIES:  ERYTHROMYCIN, IODINE, SULFA, all with nonspecific reactions.   MEDICATIONS:  1. Multivitamins 1 daily.  2. Prometrium 100 mg 1 daily.  3. Synthroid 100 mcg alternating with 88 mcg daily.  4. Citrucel 1 teaspoon daily.  5. Nasonex 1 puff b.i.d.  6. Xalatan eye drops nightly.  7. QVAR 80 2 puffs b.i.d.  8. Plavix 75 mg daily.  9. Prednisone 10 mg tablets, taking presently 2 q.a.m.  10.Potassium 20 mEq b.i.d.  11.Zyrtec 10 mg daily.  12.Metoprolol 25 mg daily.  13.Sular 20 mg daily.  14.Hyzaar 100/25 1 daily.  15.Forteo injections 20 mcg daily.  16.Prevacid 30 mg 1/2 b.i.d.  17.Albuterol per nebulizer q.4h. p.r.n.  18.Augmentin.  Her last allergy vaccination was in November, 2004.  Her last Pneumovax was  in November, 2004.  She has not yet received a flu vaccination this year.   SOCIAL HISTORY:  She has never smoked.  She is a retired Runner, broadcasting/film/video.  She  denies any alcohol history.   FAMILY HISTORY:  Positive for MI in her father at age 36, who may have had a  stroke at age 35.  One sister is healthy except for hypertension, multiple  myeloma in one of her maternal and paternal aunts.  Mother is healthy except  for pacemaker requirement.   REVIEW OF SYSTEMS:  Taken in detail, essentially negative except as outlined  above.   PHYSICAL EXAMINATION:  GENERAL:  This is a chronically and acutely ill-  appearing white female lying in the fetal position with no increased work of  breathing at rest.  VITAL SIGNS:  She  is afebrile with normal vital signs except for a blood  pressure of 100/60.  Pulse is 101.  Saturation is only 85% on room air.  HEENT:  Oropharynx is clear.  Nasal turbinates are normal.  Ear canals are  clear bilaterally.  NECK:  Supple without cervical adenopathy or tinnitus.  Trachea is midline.  No thyromegaly.  LUNGS:  A few rhonchi bilaterally.  There are no true wheezes.  There are no  bronchial changes.  HEART:  Regular rhythm without murmur, rub or gallop.  ABDOMEN:  Soft, benign but no palpable organomegaly, mass, or tenderness.  EXTREMITIES:  Warm without calf tenderness, clubbing, cyanosis or edema.  NEUROLOGIC:  No focal deficits.  She is alert and appropriate and minimally  slow in terms of mentation but intact cognition, remembering recent details  well regarding her medical care.   Lab data is pending, including chest x-ray.   IMPRESSION:  1. Acute hypoxemic respiratory failure in a patient with previously      documented severe chronic asthmatic bronchitis and bronchiectasis.  She      very well could have pneumonia with a resistant organism; therefore,      based on the fact that she has had so many different antibiotics;      therefore, I am going to classify her as a healthcare-associated      pneumonia and treat her with a combination of Maxipime, Avelox, and      vancomycin IV, pending culture of sputum and also blood cultures.  Will      also check urine for Legionella and pneumococcal strains to be      complete.  2. Poor oral intake with threatened dehydration:  I will therefore hold      antihypertensives and give her IV fluids      and clear liquids.  3. History of steroid dependency:  We will place the patient on stress      doses of hydrocortisone until she improves and then taper back down to      prednisone p.o. with a __________ of 5 mg daily.           ______________________________ [name] [name], MD, [facility]     MBW/MEDQ  D:  07/21/2006  T:   07/22/2006  Job:  [id number]

## 2011-02-17 ENCOUNTER — Encounter: Payer: Self-pay | Admitting: Adult Health

## 2011-03-01 ENCOUNTER — Ambulatory Visit (INDEPENDENT_AMBULATORY_CARE_PROVIDER_SITE_OTHER): Payer: Medicare Other | Admitting: Adult Health

## 2011-03-01 ENCOUNTER — Encounter: Payer: Self-pay | Admitting: Adult Health

## 2011-03-01 ENCOUNTER — Other Ambulatory Visit: Payer: Self-pay | Admitting: *Deleted

## 2011-03-01 ENCOUNTER — Other Ambulatory Visit: Payer: Self-pay | Admitting: Internal Medicine

## 2011-03-01 DIAGNOSIS — J479 Bronchiectasis, uncomplicated: Secondary | ICD-10-CM

## 2011-03-01 DIAGNOSIS — M810 Age-related osteoporosis without current pathological fracture: Secondary | ICD-10-CM

## 2011-03-01 MED ORDER — BECLOMETHASONE DIPROPIONATE 80 MCG/ACT IN AERS: 2.0000 | INHALATION_SPRAY | Freq: Two times a day (BID) | RESPIRATORY_TRACT | Status: DC

## 2011-03-01 NOTE — Progress Notes (Signed)
Subjective:    Patient ID: [name], female    DOB: 1933/09/02, 77 y.o.   MRN: [id number]  HPI 32 yowf never smoker with documented chronic sinusitis /bronchiectasis and a tendency to recurrent purulent exacerbations, has been self titrating prednisone with a ceiling of 40 mg per day and a floor of 5 extensively evaluated at [facility] and by [name] with neg w/u x atopic.     October 26, 2009--Presents for follow up and discuss Reclast. With  osteopenia w/ last BMD 11/10 w/ Tscore of -2.2. intolerant to Bisphosphnates, and has received Reclast x 2 infusions with no known difficulties. She is high risk for fx d/t chronic steroids. .     April 12, 2010 3 month followup. Pt c/o rhinitis and "feeling bad" x 2 wks. She also c/o HA and facial pressure on and off x 2 wks. She states she thinks her TSH is high- having wt gain and dry skin. tapered pred down to 5 mg daily. avelox no longer effective for purulent sputum, wants to try levaquin again as worked when got i from [employer] 06/2009. rec levaquin x 10      August 24, 2010 ov cough worse with greenish yellow suputm. doesn't think levaquin working as well, rec cipro > ok but not great results   November 22, 2010 ov cough worse has not filled cipro yet. no increase sob. self titrating prednisone from 40 down to floor of 5 mg and on 20 mg per day.  01/18/2011 ov post hosp for mrsa infection and L ankle followed by Ranell Patrick.  No change cough or sob.  No change chronic purulent sputum.   03/01/11 Follow up and Med review Pt presents for for follow up and med review. We reviewed all her meds and organized them into her med calendar with pt education.   She has follow up with Dr. Ranell Patrick in 2 weeks with plans to remove her left boot.   Does complain of increased SOB, wheezing, prod cough with green mucus x 3 days.Feels stopped in sinus with drainage in chest.  She has not started on her Cipro as of yet. No chest pain or leg swelling.       Allergies  1) ! Iodine  2) Sulfamethoxazole (Sulfamethoxazole)  3) Erythromycin Ethylsuccinate   Past Medical History:  HYPOTHYROIDISM (ICD-244.9)  BRONCHIECTASIS W/ACUTE EXACERBATION (ICD-494.1)  - Xolair attempted 6/04 -> 9/05  - Immunotherapy stopped 8/09  - Started xyflo 8/09 > 9/09 no benefit  - VEST 2008 no benefit  - Flutter valve helpful March 17, 2009  - Allergy profile April 12, 2010 >> IgE 30 unremarkable  - Dulera 200 added November 22, 2010  HYPERLIPIDEMIA (ICD-272.4)  - Target LDL < 70 due to prev TIA's  TRANSIENT ISCHEMIC ATTACKS, HX OF (ICD-V12.50).............................................Marland KitchenReynolds  OSTEOPOROSIS (ICD-733.00)  - Reclast January 2009, 2010, October 26, 2009 , 11/2010  - Bone Density 08/05/09 Spine 0.1 Left -2.2, Right -1.6  Health Maintenance...................................................................................................Marland KitchenWert  -Td 3/05  -Pneumovax 11/04 and 9/09  -Med calendar 12/24/08  -CPX January 11, 2010  -GYN health maint...........................................................................Marland KitchenDickstein  Asthma  Diverticulosis  - Colonoscopy 01/01/09.....................................................................Marland KitchenRussella Dar  GERD  Irritable Bowel Syndrome  Hemorrhoids  R Shoulder Pain..................................................................................Marland KitchenKendal/ Olin  Hypothyroid   SH:  She never married. No kids. Carried for her parents all her life.  She has a gentlemen friend that she has been with since she was 13  (He is now 5 but she never wanted to marry him).  Her mother has  dementia and lives with her.  She is a Runner, broadcasting/film/video.    Review of Systems Constitutional:   No  weight loss, night sweats,  Fevers, chills, fatigue, or  lassitude.  HEENT:   No headaches,  Difficulty swallowing,  Tooth/dental problems, or  Sore throat,                No sneezing, itching, ear ache, nasal congestion, post  nasal drip,   CV:  No chest pain,  Orthopnea, PND, swelling in lower extremities, anasarca, dizziness, palpitations, syncope.   GI  No heartburn, indigestion, abdominal pain, nausea, vomiting, diarrhea, change in bowel habits, loss of appetite, bloody stools.   Resp: No shortness of breath with exertion or at rest.  No excess mucus, no productive cough,  No non-productive cough,  No coughing up of blood.  No change in color of mucus.  No wheezing.  No chest wall deformity  Skin: no rash or lesions.  GU: no dysuria, change in color of urine, no urgency or frequency.  No flank pain, no hematuria   MS:  No joint pain or swelling.  No decreased range of motion.  No back pain.  Psych:  No change in mood or affect. No depression or anxiety.  No memory loss.         Objective:   Physical Exam GEN: A/Ox3; pleasant , NAD, thin elderly female.   HEENT:  [place]/AT,  EACs-clear, TMs-wnl, NOSE-clear drainage, THROAT-clear, no lesions, no postnasal drip or exudate noted.   NECK:  Supple w/ fair ROM; no JVD; normal carotid impulses w/o bruits; no thyromegaly or nodules palpated; no lymphadenopathy.  RESP  Coarse BS with few rhonchi no accessory muscle use, no dullness to percussion  CARD:  RRR, no m/r/g  , no peripheral edema, pulses intact, no cyanosis or clubbing.  GI:   Soft & nt; nml bowel sounds; no organomegaly or masses detected.  Musco: Warm bil, left foot -walking boot in place   Neuro: alert, no focal deficits noted.    Skin: Warm, no lesions or rashes         Assessment & Plan:

## 2011-03-01 NOTE — Assessment & Plan Note (Addendum)
MIld Flare  Patient's medications were reviewed today and patient education was given. Computerized medication calendar was adjusted/completed   Plan;  Begin Cipro 750mg  Twice daily  For 10 days Follow med calendar closely and bring to each visit.  Wear Oxygen 2 l/m with activity and At bedtime   follow up as planned with Dr. [name]

## 2011-03-01 NOTE — Patient Instructions (Signed)
Begin Cipro 750mg  Twice daily  For 10 days Follow med calendar closely and bring to each visit.  Wear Oxygen 2 l/m with activity and At bedtime   follow up as planned with Dr. [name]

## 2011-03-02 ENCOUNTER — Ambulatory Visit (INDEPENDENT_AMBULATORY_CARE_PROVIDER_SITE_OTHER)
Admission: RE | Admit: 2011-03-02 | Discharge: 2011-03-02 | Disposition: A | Discharge status: Home / Self Care | Payer: Medicare Other | Source: Ambulatory Visit | Attending: Internal Medicine | Admitting: Internal Medicine

## 2011-03-02 ENCOUNTER — Encounter: Payer: Self-pay | Admitting: Internal Medicine

## 2011-03-02 ENCOUNTER — Other Ambulatory Visit (INDEPENDENT_AMBULATORY_CARE_PROVIDER_SITE_OTHER): Payer: Medicare Other | Admitting: Internal Medicine

## 2011-03-02 ENCOUNTER — Ambulatory Visit (INDEPENDENT_AMBULATORY_CARE_PROVIDER_SITE_OTHER): Payer: Medicare Other | Admitting: Internal Medicine

## 2011-03-02 ENCOUNTER — Other Ambulatory Visit (INDEPENDENT_AMBULATORY_CARE_PROVIDER_SITE_OTHER): Payer: Medicare Other

## 2011-03-02 DIAGNOSIS — E785 Hyperlipidemia, unspecified: Secondary | ICD-10-CM

## 2011-03-02 DIAGNOSIS — Z0000 Encounter for general adult medical examination without abnormal findings: Secondary | ICD-10-CM

## 2011-03-02 DIAGNOSIS — J479 Bronchiectasis, uncomplicated: Secondary | ICD-10-CM

## 2011-03-02 DIAGNOSIS — I10 Essential (primary) hypertension: Secondary | ICD-10-CM

## 2011-03-02 DIAGNOSIS — M810 Age-related osteoporosis without current pathological fracture: Secondary | ICD-10-CM

## 2011-03-02 DIAGNOSIS — E039 Hypothyroidism, unspecified: Secondary | ICD-10-CM

## 2011-03-02 LAB — URINALYSIS, ROUTINE W REFLEX MICROSCOPIC
Bilirubin Urine: NEGATIVE
Nitrite: NEGATIVE
Total Protein, Urine: NEGATIVE
pH: 6.5 (ref 5.0–8.0)

## 2011-03-02 LAB — LIPID PANEL
LDL Cholesterol: 61 mg/dL (ref 0–99)
Total CHOL/HDL Ratio: 2
Triglycerides: 116 mg/dL (ref 0.0–149.0)

## 2011-03-02 LAB — BASIC METABOLIC PANEL
BUN: 11 mg/dL (ref 6–23)
Chloride: 101 mEq/L (ref 96–112)
Glucose, Bld: 88 mg/dL (ref 70–99)
Potassium: 4.1 mEq/L (ref 3.5–5.1)

## 2011-03-02 LAB — HEPATIC FUNCTION PANEL
ALT: 11 U/L (ref 0–35)
Bilirubin, Direct: 0.1 mg/dL (ref 0.0–0.3)
Total Bilirubin: 0.3 mg/dL (ref 0.3–1.2)

## 2011-03-02 NOTE — Patient Instructions (Signed)

## 2011-03-02 NOTE — Progress Notes (Signed)
Subjective:     Patient ID: [name], female   DOB: August 24, 1933, 77 y.o.   MRN: [id number]  HPI  47 yowf never smoker with documented chronic sinusitis /bronchiectasis and a tendency to recurrent purulent exacerbations, has been self titrating prednisone with a ceiling of 40 mg per day and a floor of 5 extensively evaluated at [facility] and by [name] with neg w/u x atopic.   October 26, 2009--Presents for follow up and discuss Reclast. With osteopenia w/ last BMD 11/10 w/ Tscore of -2.2. intolerant to Bisphosphnates, and has received Reclast x 2 infusions with no known difficulties. She is high risk for fx d/t chronic steroids.  April 12, 2010 3 month followup. Pt c/o rhinitis and "feeling bad" x 2 wks. She also c/o HA and facial pressure on and off x 2 wks. She states she thinks her TSH is high- having wt gain and dry skin. tapered pred down to 5 mg daily. avelox no longer effective for purulent sputum, wants to try levaquin again as worked when got i from [facility] 06/2009. rec levaquin x 10   August 24, 2010 ov cough worse with greenish yellow suputm. doesn't think levaquin working as well, rec cipro > ok but not great results   November 22, 2010 ov cough worse has not filled cipro yet. no increase sob. self titrating prednisone from 40 down to floor of 5 mg and on 20 mg per day.   01/18/2011 ov post hosp for mrsa infection and L ankle followed by Ranell Patrick. No change cough or sob. No change chronic purulent sputum.   03/01/11 Follow up and Med review  Pt presents for for follow up and med review. We reviewed all her meds and organized them into her med calendar with pt education.  She has follow up with Dr. Ranell Patrick in 2 weeks with plans to remove her left boot  03/02/2011 ov / CPX  No new complaints.  Pt denies any significant sore throat, dysphagia, itching, sneezing,  nasal congestion or excess/ purulent secretions,  fever, chills, sweats, unintended wt loss, pleuritic or exertional cp,  hempoptysis, orthopnea pnd or leg swelling.    Also denies any obvious fluctuation of symptoms with weather or environmental changes or other aggravating or alleviating factors.  Using albuterol avg of 3 x daily due to breakthru wheeze but typically Sleeping ok without nocturnal  or early am exac of resp c/o's or need for noct saba.    Allergies  1) ! Iodine  2) Sulfamethoxazole (Sulfamethoxazole)  3) Erythromycin Ethylsuccinate    Past Medical History:  HYPOTHYROIDISM (ICD-244.9)  BRONCHIECTASIS W/ACUTE EXACERBATION (ICD-494.1)  - Xolair attempted 6/04 -> 9/05 no benefit - Immunotherapy stopped 8/09  - Started xyflo 8/09 > 9/09 no benefit  - VEST 2008 no benefit  - Flutter valve helpful March 17, 2009  - Allergy profile April 12, 2010 >> IgE 30 unremarkable  - Dulera 200 added November 22, 2010  Could not tol thrush HYPERLIPIDEMIA (ICD-272.4)  - Target LDL < 70 due to prev TIA's  TRANSIENT ISCHEMIC ATTACKS, HX OF (ICD-V12.50).............................................Marland KitchenReynolds  OSTEOPOROSIS (ICD-733.00)  - Reclast January 2009, 2010, October 26, 2009 , 11/2010  - Bone Density 08/05/09 Spine 0.1 Left -2.2, Right -1.6  Health Maintenance...................................................................................................Marland KitchenWert  -Td 11/2003 -Pneumovax 11/04 and 9/09  -Med calendar 12/24/08  And 03/01/11 -CPX 03/02/11 -GYN health maint...........................................................................Marland KitchenDickstein's practice Asthma  Diverticulosis  - Colonoscopy 01/01/09.....................................................................Marland KitchenRussella Dar  GERD  Irritable Bowel Syndrome  Hemorrhoids  R Shoulder Pain..................................................................................[name]  Hypothyroid  Social History She never married. No kids. Carried for her parents all her life.  She has a gentlemen friend that she has been with since she was 62  (He  is now 66 but she never wanted to marry him).  Her mother has dementia and lives with her.  She was a Engineer, water work.     Review of Systems  Constitutional: Negative for fever, chills and unexpected weight change.  HENT: Positive for sneezing. Negative for ear pain, nosebleeds, congestion, sore throat, rhinorrhea, trouble swallowing, dental problem, voice change, postnasal drip and sinus pressure.   Eyes: Negative for visual disturbance.  Respiratory: Positive for cough. Negative for choking and shortness of breath.   Cardiovascular: Negative for chest pain and leg swelling.  Gastrointestinal: Negative for vomiting, abdominal pain and diarrhea.  Genitourinary: Negative for difficulty urinating.  Musculoskeletal: Negative for arthralgias.  Skin: Negative for rash.  Neurological: Negative for tremors, syncope and headaches.  Hematological: Does not bruise/bleed easily.       Objective:   Physical Exam    wt 105 January 07, 2009 >   > 114 November 19, 2009 > 117 January 11, 2010  amb wf nad  HEENT: nl dentition, turbinates, and orophanx. Nl external ear canals without cough reflex  NECK : without JVD/Nodes/TM/ nl carotid upstrokes bilaterally  LUNGS: no acc muscle use, pan exp sonorous rhonchi  CV: RRR no s3 or murmur or increase in P2, no edema  ABD: soft and nontender with nl excursion in the supine position. No bruits or organomegaly, bowel sounds nl  MS: warm without deformities, calf tenderness, cyanosis or clubbing. Wearing boot L leg  Neuro alert, nl gait, no focal deficits, intact sensorium and recall  Pedal pulses present bilaterally and sym  Assessment:         Plan:

## 2011-03-03 ENCOUNTER — Encounter: Payer: Self-pay | Admitting: Internal Medicine

## 2011-03-03 NOTE — Assessment & Plan Note (Signed)
Adequate control on present rx, reviewed  

## 2011-03-03 NOTE — Progress Notes (Signed)
Quick Note:  Spoke with pt and notified of results per Dr. [name]. Pt verbalized understanding and denied any questions.  ______ 

## 2011-03-03 NOTE — Assessment & Plan Note (Signed)
Since requires chronic systemic steroids should probably be on reclast indefinitely if qualifies for insurance and no side effects

## 2011-03-03 NOTE — Assessment & Plan Note (Signed)
   Each maintenance medication was reviewed in detail including most importantly the difference between maintenance and as needed and under what circumstances the prns are to be used. This was done in the context of a medication calendar review which provided the patient with a user-friendly unambiguous mechanism for medication administration and reconciliation and provides an action plan for all active problems. It is critical that this be shown to every doctor  for modification during the office visit if necessary so the patient can use it as a working document.     

## 2011-04-07 ENCOUNTER — Other Ambulatory Visit: Payer: Self-pay | Admitting: Family Medicine

## 2011-04-07 NOTE — Telephone Encounter (Signed)
Called pharmacy and script is being filled, ( Dr. [name] )

## 2011-04-12 ENCOUNTER — Other Ambulatory Visit: Payer: Self-pay | Admitting: Internal Medicine

## 2011-05-03 ENCOUNTER — Other Ambulatory Visit: Payer: Self-pay | Admitting: Internal Medicine

## 2011-05-16 ENCOUNTER — Encounter: Payer: Self-pay | Admitting: Gastroenterology

## 2011-05-16 ENCOUNTER — Ambulatory Visit (INDEPENDENT_AMBULATORY_CARE_PROVIDER_SITE_OTHER): Payer: Medicare Other | Admitting: Gastroenterology

## 2011-05-16 VITALS — BP 100/60 | HR 70 | Ht 62.0 in | Wt 112.0 lb

## 2011-05-16 DIAGNOSIS — K921 Melena: Secondary | ICD-10-CM

## 2011-05-16 DIAGNOSIS — Z86010 Personal history of colonic polyps: Secondary | ICD-10-CM

## 2011-05-16 MED ORDER — HYDROCORTISONE ACETATE 25 MG RE SUPP: RECTAL | Status: DC

## 2011-05-16 NOTE — Patient Instructions (Signed)
A prescription for Anusol has been sent to your pharmacy.  Follow up as needed.

## 2011-05-16 NOTE — Progress Notes (Signed)
History of Present Illness: This is a 77 year old female he has had small amounts of bright red blood on the tissue paper intermittently for the past few months. She has a history of diverticulosis, internal hemorrhoids and adenomatous colon polyps. Her last colonoscopy was performed in April 2010 showing the above findings. Her mother was recently diagnosed with colon cancer at age 72. The patient notes no change in bowel habits, change in stool caliber, diarrhea, constipation, rectal pain, abdominal pain.  Current Medications, Allergies, Past Medical History, Past Surgical History, Family History and Social History were reviewed in [employer] record.  Physical Exam: General: Well developed , well nourished, no acute distress Head: Normocephalic and atraumatic Eyes:  sclerae anicteric, EOMI Ears: Normal auditory acuity Mouth: No deformity or lesions Lungs: Decreased breath sounds throughout with scattered wheezes Heart: Regular rate and rhythm; no murmurs, rubs or bruits Abdomen: Soft, non tender and non distended. No masses, hepatosplenomegaly or hernias noted. Normal Bowel sounds Rectal: No external or internal lesions. No tenderness. Hemoccult negative brown stool in the vault Musculoskeletal: Symmetrical with no gross deformities  Pulses:  Normal pulses noted Extremities: No clubbing, cyanosis, edema or deformities noted Neurological: Alert oriented x 4, grossly nonfocal Psychological:  Alert and cooperative. Normal mood and affect  Assessment and Recommendations:  1. Internal hemorrhoids with intermittent small volume rectal bleeding. Begin Anusol-HC suppositories daily as needed and all standard hemorrhoidal care measures. If her rectal bleeding worsens consider further evaluation with colonoscopy however she is at higher risk for procedure and sedation related complications due to her comorbidities.  2. Personal history of adenomatous colon polyps and a mother  with colon cancer at age 74. If the patient's health status is appropriate surveillance colonoscopy recommended April 2015.

## 2011-05-25 ENCOUNTER — Encounter: Payer: Self-pay | Admitting: Internal Medicine

## 2011-05-25 ENCOUNTER — Ambulatory Visit (INDEPENDENT_AMBULATORY_CARE_PROVIDER_SITE_OTHER): Payer: Medicare Other | Admitting: Internal Medicine

## 2011-05-25 DIAGNOSIS — J479 Bronchiectasis, uncomplicated: Secondary | ICD-10-CM

## 2011-05-25 NOTE — Patient Instructions (Signed)
See calendar for specific medication instructions and bring it back for each and every office visit for every healthcare provider you see.  Without it,  you may not receive the best quality medical care that we feel you deserve.  You will note that the calendar groups together  your maintenance  medications that are timed at particular times of the day.  Think of this as your checklist for what your doctor has instructed you to do until your next evaluation to see what benefit  there is  to staying on a consistent group of medications intended to keep you well.  The other group at the bottom is entirely up to you to use as you see fit  for specific symptoms that may arise between visits that require you to treat them on an as needed basis.  Think of this as your action plan or "what if" list.   Separating the top medications from the bottom group is fundamental to providing you adequate care going forward.    Use prednisone at 10 mg per day as your new floor  Please schedule a follow up visit in 3 months but call sooner if needed

## 2011-05-25 NOTE — Assessment & Plan Note (Signed)
The goal with a chronic steroid dependent illness is always arriving at the lowest effective dose that controls the disease/symptoms and not accepting a set "formula" which is based on statistics or guidelines that don't always take into account patient  variability or the natural hx of the dz in every individual patient, which may well vary over time.  For now therefore I recommend the patient maintain  Ceiling of 40 mg per day and floor of 10 since calcium homeostasis being corrected with reclast and feels so tired on a floor of 5 mg per day likely has adrenal insufficiency at this point.

## 2011-05-25 NOTE — Progress Notes (Signed)
Subjective:     Patient ID: [name], female   DOB: July 25, 1933, 77 y.o.   MRN: [id number]  HPI  25 yowf never smoker with documented chronic sinusitis /bronchiectasis and a tendency to recurrent purulent exacerbations, has been self titrating prednisone with a ceiling of 40 mg per day and a floor of 5 extensively evaluated at [facility] and by Dr [name] with neg w/u x atopic.   October 26, 2009--Presents for follow up and discuss Reclast. With osteopenia w/ last BMD 11/10 w/ Tscore of -2.2. intolerant to Bisphosphnates, and has received Reclast x 2 infusions with no known difficulties. She is high risk for fx d/t chronic steroids.  April 12, 2010 3 month followup. Pt c/o rhinitis and "feeling bad" x 2 wks. She also c/o HA and facial pressure on and off x 2 wks. She states she thinks her TSH is high- having wt gain and dry skin. tapered pred down to 5 mg daily. avelox no longer effective for purulent sputum, wants to try levaquin again as worked when got i from [facility] 06/2009. rec levaquin x 10   August 24, 2010 ov cough worse with greenish yellow suputm. doesn't think levaquin working as well, rec cipro > ok but not great results   November 22, 2010 ov cough worse has not filled cipro yet. no increase sob. self titrating prednisone from 40 down to floor of 5 mg and on 20 mg per day. No change rx    01/18/2011 ov post hosp for mrsa infection and L ankle followed by [name]. No change rx.    03/02/2011 ov / CPX  No new complaints. rec follow med calendar, no change in rx  05/25/2011 f/u ov/[name] cc low energy better with higher doses of pred but back down to 5 mg daily. Green sputum better with cipro but never clears.  No sob over baseline  Pt denies any significant sore throat, dysphagia, itching, sneezing,  nasal congestion or excess/ purulent secretions,  fever, chills, sweats, unintended wt loss, pleuritic or exertional cp, hempoptysis, orthopnea pnd or leg swelling.  Also denies  presyncope, palpitations, heartburn, abdominal pain, nausea, vomiting, diarrhea  or change in bowel or urinary habits, dysuria,hematuria,  rash, arthralgias, visual complaints, headache, numbness weakness or ataxia.  Also denies any obvious fluctuation of symptoms with weather or environmental changes or other aggravating or alleviating factors.     Allergies  1) ! Iodine  2) Sulfamethoxazole (Sulfamethoxazole)  3) Erythromycin Ethylsuccinate    Past Medical History:  HYPOTHYROIDISM (ICD-244.9)  BRONCHIECTASIS W/ACUTE EXACERBATION (ICD-494.1)  - Xolair attempted 6/04 -> 9/05 no benefit - Immunotherapy stopped 8/09  - Started xyflo 8/09 > 9/09 no benefit  - VEST 2008 no benefit  - Flutter valve helpful March 17, 2009  - Allergy profile April 12, 2010 >> IgE 30 unremarkable  - Dulera 200 added November 22, 2010  Could not tol thrush HYPERLIPIDEMIA (ICD-272.4)  - Target LDL < 70 due to prev TIA's  TRANSIENT ISCHEMIC ATTACKS, HX OF (ICD-V12.50).............................................Marland KitchenReynolds  OSTEOPOROSIS (ICD-733.00)  - Reclast January 2009, 2010, October 26, 2009 , 11/2010  - Bone Density 08/05/09 Spine 0.1 Left -2.2, Right -1.6  Health Maintenance...................................................................................................Marland KitchenWert  -Td 11/2003 -Pneumovax 11/04 and 9/09  -Med calendar 12/24/08  And 03/01/11 -CPX 03/02/11 -GYN health maint...........................................................................Marland KitchenDickstein's practice Asthma  Diverticulosis  - Colonoscopy 01/01/09.....................................................................Marland KitchenRussella [name]  GERD  Irritable Bowel Syndrome  Hemorrhoids  R Shoulder Pain..................................................................................Marland KitchenKendal/ [name]  Hypothyroid    Social History She never married. No kids. Carried for her  parents all her life but mother now terminal in snf  She has a gentlemen friend  that she has been with since she was 32  (He is now 46 but she never wanted to marry him).  Her mother has dementia and lives with her.  She was a Engineer, water work.           Objective:   Physical Exam    wt 105 January 07, 2009 >   > 114 November 19, 2009 > 117 January 11, 2010 > 05/25/2011  110 amb wf nad  HEENT: nl dentition, turbinates, and orophanx. Nl external ear canals without cough reflex  NECK : without JVD/Nodes/TM/ nl carotid upstrokes bilaterally  LUNGS: no acc muscle use, pan exp sonorous rhonchi  CV: RRR no s3 or murmur or increase in P2, no edema  ABD: soft and nontender with nl excursion in the supine position. No bruits or organomegaly, bowel sounds nl  MS: warm without deformities, calf tenderness, cyanosis or clubbing. Wearing boot L leg  Neuro alert, nl gait, no focal deficits, intact sensorium and recall  Pedal pulses present bilaterally and sym  Assessment:         Plan:

## 2011-06-13 ENCOUNTER — Encounter: Payer: Self-pay | Admitting: Internal Medicine

## 2011-06-16 LAB — COMPREHENSIVE METABOLIC PANEL
ALT: 15
CO2: 29
Calcium: 9.4
Chloride: 94 — ABNORMAL LOW
Creatinine, Ser: 0.85
GFR calc non Af Amer: >60
Glucose, Bld: 100 — ABNORMAL HIGH
Total Bilirubin: 0.7

## 2011-06-16 LAB — MAGNESIUM: Magnesium: 1.9

## 2011-06-20 ENCOUNTER — Ambulatory Visit (INDEPENDENT_AMBULATORY_CARE_PROVIDER_SITE_OTHER): Payer: Medicare Other | Admitting: Adult Health

## 2011-06-20 VITALS — BP 110/60 | HR 80 | Temp 97.0°F | Wt 113.6 lb

## 2011-06-20 DIAGNOSIS — J471 Bronchiectasis with (acute) exacerbation: Secondary | ICD-10-CM

## 2011-06-20 MED ORDER — AMOXICILLIN-POT CLAVULANATE 875-125 MG PO TABS: 1.0000 | ORAL_TABLET | Freq: Two times a day (BID) | ORAL | Status: AC

## 2011-06-20 NOTE — Progress Notes (Signed)
Subjective:     Patient ID: [name], female   DOB: 1933-06-21, 77 y.o.   MRN: [id number]  HPI  71 yowf never smoker with documented chronic sinusitis /bronchiectasis and a tendency to recurrent purulent exacerbations, has been self titrating prednisone with a ceiling of 40 mg per day and a floor of 5 extensively evaluated at [facility] and by [name] with neg w/u x atopic.   October 26, 2009--Presents for follow up and discuss Reclast. With osteopenia w/ last BMD 11/10 w/ Tscore of -2.2. intolerant to Bisphosphnates, and has received Reclast x 2 infusions with no known difficulties. She is high risk for fx d/t chronic steroids.  April 12, 2010 3 month followup. Pt c/o rhinitis and "feeling bad" x 2 wks. She also c/o HA and facial pressure on and off x 2 wks. She states she thinks her TSH is high- having wt gain and dry skin. tapered pred down to 5 mg daily. avelox no longer effective for purulent sputum, wants to try levaquin again as worked when got i from [facility] 06/2009. rec levaquin x 10   August 24, 2010 ov cough worse with greenish yellow suputm. doesn't think levaquin working as well, rec cipro > ok but not great results   November 22, 2010 ov cough worse has not filled cipro yet. no increase sob. self titrating prednisone from 40 down to floor of 5 mg and on 20 mg per day. No change rx    01/18/2011 ov post hosp for mrsa infection and L ankle followed by Ranell Patrick. No change rx.    03/02/2011 ov / CPX  No new complaints. rec follow med calendar, no change in rx  05/25/2011 f/u ov/[name] cc low energy better with higher doses of pred but back down to 5 mg daily. Green sputum better with cipro but never clears.  No sob over baseline >>set steroid floor at 10mg    06/20/2011 Acute OV  Complains of sinus headaches , fever,  generalized fatigue, prod cough (green), sinus drainage  (clear) - PT had increased PRed to 40mg  1 week ago and is back down to 20 mg now  . Still coughing and  congestion. Cough is not getting better. Last abx was in April this year.  No hemoptysis , no fever or chest pain. No edema.    Allergies  1) ! Iodine  2) Sulfamethoxazole (Sulfamethoxazole)  3) Erythromycin Ethylsuccinate    Past Medical History:  HYPOTHYROIDISM (ICD-244.9)  BRONCHIECTASIS W/ACUTE EXACERBATION (ICD-494.1)  - Xolair attempted 6/04 -> 9/05 no benefit - Immunotherapy stopped 8/09  - Started xyflo 8/09 > 9/09 no benefit  - VEST 2008 no benefit  - Flutter valve helpful March 17, 2009  - Allergy profile April 12, 2010 >> IgE 30 unremarkable  - Dulera 200 added November 22, 2010  Could not tol thrush HYPERLIPIDEMIA (ICD-272.4)  - Target LDL < 70 due to prev TIA's  TRANSIENT ISCHEMIC ATTACKS, HX OF (ICD-V12.50).............................................Marland KitchenReynolds  OSTEOPOROSIS (ICD-733.00)  - Reclast January 2009, 2010, October 26, 2009 , 11/2010  - Bone Density 08/05/09 Spine 0.1 Left -2.2, Right -1.6  Health Maintenance...................................................................................................Marland KitchenWert  -Td 11/2003 -Pneumovax 11/04 and 9/09  -Med calendar 12/24/08  And 03/01/11 -CPX 03/02/11 -GYN health maint...........................................................................Marland KitchenDickstein's practice Asthma  Diverticulosis  - Colonoscopy 01/01/09.....................................................................Marland KitchenRussella Dar  GERD  Irritable Bowel Syndrome  Hemorrhoids  R Shoulder Pain..................................................................................[name]  Hypothyroid    Social History She never married. No kids. Carried for her parents all her life but mother now terminal in snf  She has a gentlemen friend that she has been with since she was 67  (He is now 75 but she never wanted to marry him).  Her mother has dementia and lives with her.  She was a Engineer, water work.   ROS:  Constitutional:   No   weight loss, night sweats,  Fevers, chills, fatigue, or  lassitude.  HEENT:   No headaches,  Difficulty swallowing,  Tooth/dental problems, or  Sore throat,    CV:  No chest pain,  Orthopnea, PND, swelling in lower extremities, anasarca, dizziness, palpitations, syncope.   GI  No heartburn, indigestion, abdominal pain, nausea, vomiting, diarrhea, change in bowel habits, loss of appetite, bloody stools.   Resp: .   No wheezing.  No chest wall deformity  Skin: no rash or lesions.  GU: no dysuria, change in color of urine, no urgency or frequency.  No flank pain, no hematuria   MS:  No joint pain or swelling.  No decreased range of motion.  No back pain.  Psych:  No change in mood or affect. No depression or anxiety.  No memory loss.            Objective:   Physical Exam    wt 105 January 07, 2009 >   > 114 November 19, 2009 > 117 January 11, 2010 > 05/25/2011  110 >>113 06/20/2011  amb wf nad  HEENT: nl dentition, turbinates, and orophanx  Nasal clear drainage. . Nl external ear canals without cough reflex  NECK : without JVD/Nodes/TM/ nl carotid upstrokes bilaterally  LUNGS: no acc muscle use, pan exp sonorous rhonchi  CV: RRR no s3 or murmur or increase in P2, no edema  ABD: soft and nontender with nl excursion in the supine position. No bruits or organomegaly, bowel sounds nl  MS: warm without deformities, calf tenderness, cyanosis or clubbing Neuro alert, nl gait, no focal deficits, intact sensorium and recall  Pedal pulses present bilaterally and sym  Assessment:         Plan:

## 2011-06-20 NOTE — Assessment & Plan Note (Addendum)
Flare with associated sinusitis   Plan:  Augmentin 875mg  Twice daily  For 10 days with food  Mucinex DM Twice daily  As needed  Cough/congestion  Fluids and rest  Please contact office for sooner follow up if symptoms do not improve or worsen or seek emergency care

## 2011-06-20 NOTE — Patient Instructions (Signed)
Augmentin 875mg  Twice daily  For 10 days with food  Mucinex DM Twice daily  As needed  Cough/congestion  Fluids and rest  Please contact office for sooner follow up if symptoms do not improve or worsen or seek emergency care

## 2011-06-27 LAB — LEGIONELLA ANTIGEN, URINE

## 2011-06-27 LAB — GLUCOSE, CAPILLARY
Glucose-Capillary: 106 — ABNORMAL HIGH
Glucose-Capillary: 116 — ABNORMAL HIGH
Glucose-Capillary: 118 — ABNORMAL HIGH
Glucose-Capillary: 120 — ABNORMAL HIGH
Glucose-Capillary: 127 — ABNORMAL HIGH
Glucose-Capillary: 142 — ABNORMAL HIGH
Glucose-Capillary: 68 — ABNORMAL LOW
Glucose-Capillary: 76
Glucose-Capillary: 84
Glucose-Capillary: 86
Glucose-Capillary: 91
Glucose-Capillary: 93

## 2011-06-27 LAB — BASIC METABOLIC PANEL
BUN: 16
BUN: 2 — ABNORMAL LOW
BUN: 9
BUN: 1 — ABNORMAL LOW
CO2: 21
CO2: 22
CO2: 28
CO2: 22
Calcium: 7.1 — ABNORMAL LOW
Calcium: 7.1 — ABNORMAL LOW
Calcium: 7.6 — ABNORMAL LOW
Chloride: 102
Chloride: 102
Chloride: 111
Chloride: 112
Creatinine, Ser: 0.53
Creatinine, Ser: 0.79
Creatinine, Ser: 0.9
GFR calc Af Amer: >60
GFR calc Af Amer: >60
GFR calc Af Amer: >60
GFR calc Af Amer: >60
GFR calc Af Amer: >60
GFR calc non Af Amer: >60
GFR calc non Af Amer: >60
GFR calc non Af Amer: >60
GFR calc non Af Amer: >60
Glucose, Bld: 100 — ABNORMAL HIGH
Glucose, Bld: 271 — ABNORMAL HIGH
Glucose, Bld: 95
Glucose, Bld: 120 — ABNORMAL HIGH
Potassium: 2.4 — CL
Potassium: 2.8 — ABNORMAL LOW
Potassium: 2.9 — ABNORMAL LOW
Potassium: 3.5
Potassium: 3.8
Potassium: 4
Sodium: 136
Sodium: 136
Sodium: 136

## 2011-06-27 LAB — CBC
HCT: 29.8 — ABNORMAL LOW
HCT: 38
HCT: 43.2
Hemoglobin: 10.4 — ABNORMAL LOW
Hemoglobin: 12
Hemoglobin: 17.1 — ABNORMAL HIGH
Hemoglobin: 9.9 — ABNORMAL LOW
MCHC: 33.2
MCHC: 33.6
MCHC: 33.8
MCHC: 33.3
MCV: 88.1
MCV: 88.4
MCV: 89.4
MCV: 87.9
Platelets: 274
Platelets: 357
Platelets: 408 — ABNORMAL HIGH
Platelets: 443 — ABNORMAL HIGH
RBC: 3.54 — ABNORMAL LOW
RBC: 4
RBC: 5.75 — ABNORMAL HIGH
RBC: 4.52
RDW: 13.8
RDW: 13.9
RDW: 14
RDW: 14.1
RDW: 14.7
RDW: 14.8
WBC: 12.7 — ABNORMAL HIGH
WBC: 20.4 — ABNORMAL HIGH
WBC: 24.4 — ABNORMAL HIGH
WBC: 18.5 — ABNORMAL HIGH

## 2011-06-27 LAB — DIFFERENTIAL
Basophils Absolute: 0.1
Basophils Absolute: 0.1
Basophils Relative: 0
Basophils Relative: 1
Eosinophils Absolute: 0
Eosinophils Absolute: 0
Eosinophils Relative: 0
Eosinophils Relative: 0
Lymphocytes Relative: 8 — ABNORMAL LOW
Monocytes Absolute: 1.7 — ABNORMAL HIGH
Neutrophils Relative %: 76

## 2011-06-27 LAB — COMPREHENSIVE METABOLIC PANEL
ALT: 16
ALT: 20
ALT: 12
ALT: 9
AST: 27
AST: 31
AST: 12
AST: 17
Albumin: 1.6 — ABNORMAL LOW
Albumin: 2.1 — ABNORMAL LOW
Alkaline Phosphatase: 53
Alkaline Phosphatase: 73
CO2: 27
CO2: 24
Calcium: 7.2 — ABNORMAL LOW
Chloride: 91 — ABNORMAL LOW
Chloride: 94 — ABNORMAL LOW
Creatinine, Ser: 0.98
GFR calc Af Amer: 29 — ABNORMAL LOW
GFR calc Af Amer: >60
GFR calc Af Amer: >60
GFR calc non Af Amer: 24 — ABNORMAL LOW
GFR calc non Af Amer: 56 — ABNORMAL LOW
Glucose, Bld: 148 — ABNORMAL HIGH
Glucose, Bld: 237 — ABNORMAL HIGH
Potassium: 3.7
Potassium: 3 — ABNORMAL LOW
Potassium: 3.5
Sodium: 133 — ABNORMAL LOW
Sodium: 137
Sodium: 133 — ABNORMAL LOW
Total Bilirubin: 0.9
Total Bilirubin: 1.4 — ABNORMAL HIGH
Total Protein: 4.2 — ABNORMAL LOW
Total Protein: 5.2 — ABNORMAL LOW

## 2011-06-27 LAB — CREATININE, URINE, RANDOM: Creatinine, Urine: 125.8

## 2011-06-27 LAB — URINE CULTURE

## 2011-06-27 LAB — WOUND CULTURE

## 2011-06-27 LAB — CARDIAC PANEL(CRET KIN+CKTOT+MB+TROPI)
CK, MB: 2.9
Relative Index: 1
Relative Index: INVALID
Total CK: 280 — ABNORMAL HIGH
Total CK: 67
Troponin I: 0.06

## 2011-06-27 LAB — URINALYSIS, ROUTINE W REFLEX MICROSCOPIC
Glucose, UA: NEGATIVE
Hgb urine dipstick: NEGATIVE
Ketones, ur: NEGATIVE
Leukocytes, UA: NEGATIVE
Nitrite: NEGATIVE
Protein, ur: NEGATIVE
Urobilinogen, UA: 0.2
pH: 6

## 2011-06-27 LAB — CULTURE, BLOOD (ROUTINE X 2)

## 2011-06-27 LAB — LIPASE, BLOOD
Lipase: 13
Lipase: 18
Lipase: 66 — ABNORMAL HIGH

## 2011-06-27 LAB — HEMOGLOBIN AND HEMATOCRIT, BLOOD
HCT: 31.9 — ABNORMAL LOW
Hemoglobin: 10.9 — ABNORMAL LOW

## 2011-06-27 LAB — MAGNESIUM
Magnesium: 1.4 — ABNORMAL LOW
Magnesium: 1.5

## 2011-06-27 LAB — URINE MICROSCOPIC-ADD ON

## 2011-06-27 LAB — PHOSPHORUS: Phosphorus: 2.8

## 2011-06-27 LAB — VANCOMYCIN, TROUGH: Vancomycin Tr: 8.6 — ABNORMAL LOW

## 2011-06-27 LAB — SODIUM, URINE, RANDOM: Sodium, Ur: 52

## 2011-06-27 LAB — CK TOTAL AND CKMB (NOT AT ARMC)
CK, MB: 1.3
Total CK: 30

## 2011-06-27 LAB — PROTIME-INR: INR: 1.2

## 2011-06-27 LAB — CULTURE, RESPIRATORY W GRAM STAIN

## 2011-06-27 LAB — STREP PNEUMONIAE URINARY ANTIGEN: Strep Pneumo Urinary Antigen: NEGATIVE

## 2011-06-27 LAB — AMYLASE: Amylase: 193 — ABNORMAL HIGH

## 2011-06-29 ENCOUNTER — Other Ambulatory Visit: Payer: Self-pay | Admitting: *Deleted

## 2011-06-29 MED ORDER — ALPRAZOLAM 0.25 MG PO TABS: 0.2500 mg | ORAL_TABLET | Freq: Four times a day (QID) | ORAL | Status: DC | PRN

## 2011-07-27 ENCOUNTER — Emergency Department (HOSPITAL_COMMUNITY): Payer: Medicare Other

## 2011-07-27 ENCOUNTER — Emergency Department (HOSPITAL_COMMUNITY)
Admission: EM | Admit: 2011-07-27 | Discharge: 2011-07-27 | Disposition: A | Discharge status: Home / Self Care | Payer: Medicare Other | Attending: Emergency Medicine | Admitting: Emergency Medicine

## 2011-07-27 DIAGNOSIS — Z8673 Personal history of transient ischemic attack (TIA), and cerebral infarction without residual deficits: Secondary | ICD-10-CM | POA: Insufficient documentation

## 2011-07-27 DIAGNOSIS — I10 Essential (primary) hypertension: Secondary | ICD-10-CM | POA: Insufficient documentation

## 2011-07-27 DIAGNOSIS — M25579 Pain in unspecified ankle and joints of unspecified foot: Secondary | ICD-10-CM | POA: Insufficient documentation

## 2011-07-27 DIAGNOSIS — W010XXA Fall on same level from slipping, tripping and stumbling without subsequent striking against object, initial encounter: Secondary | ICD-10-CM | POA: Insufficient documentation

## 2011-08-01 ENCOUNTER — Ambulatory Visit (INDEPENDENT_AMBULATORY_CARE_PROVIDER_SITE_OTHER): Payer: Medicare Other

## 2011-08-01 DIAGNOSIS — Z23 Encounter for immunization: Secondary | ICD-10-CM

## 2011-08-02 ENCOUNTER — Other Ambulatory Visit: Payer: Self-pay | Admitting: Internal Medicine

## 2011-08-09 ENCOUNTER — Other Ambulatory Visit: Payer: Self-pay | Admitting: Internal Medicine

## 2011-08-22 ENCOUNTER — Ambulatory Visit: Payer: Medicare Other | Admitting: Internal Medicine

## 2011-08-25 ENCOUNTER — Ambulatory Visit (INDEPENDENT_AMBULATORY_CARE_PROVIDER_SITE_OTHER): Payer: Medicare Other | Admitting: Adult Health

## 2011-08-25 ENCOUNTER — Ambulatory Visit: Payer: Medicare Other | Admitting: Internal Medicine

## 2011-08-25 ENCOUNTER — Other Ambulatory Visit: Payer: Medicare Other

## 2011-08-25 ENCOUNTER — Encounter: Payer: Self-pay | Admitting: Adult Health

## 2011-08-25 VITALS — BP 132/64 | HR 79 | Temp 97.9°F | Ht 62.0 in | Wt 116.2 lb

## 2011-08-25 DIAGNOSIS — J479 Bronchiectasis, uncomplicated: Secondary | ICD-10-CM

## 2011-08-25 NOTE — Progress Notes (Signed)
Subjective:     Patient ID: [name], female   DOB: 1933/08/03, 77 y.o.   MRN: [id number]  HPI  7 yowf never smoker with documented chronic sinusitis /bronchiectasis and a tendency to recurrent purulent exacerbations, has been self titrating prednisone with a ceiling of 40 mg per day and a floor of 5 extensively evaluated at [facility] and by [name] with neg w/u x atopic.   October 26, 2009--Presents for follow up and discuss Reclast. With osteopenia w/ last BMD 11/10 w/ Tscore of -2.2. intolerant to Bisphosphnates, and has received Reclast x 2 infusions with no known difficulties. She is high risk for fx d/t chronic steroids.  April 12, 2010 3 month followup. Pt c/o rhinitis and "feeling bad" x 2 wks. She also c/o HA and facial pressure on and off x 2 wks. She states she thinks her TSH is high- having wt gain and dry skin. tapered pred down to 5 mg daily. avelox no longer effective for purulent sputum, wants to try levaquin again as worked when got i from [employer] 06/2009. rec levaquin x 10   August 24, 2010 ov cough worse with greenish yellow suputm. doesn't think levaquin working as well, rec cipro > ok but not great results   November 22, 2010 ov cough worse has not filled cipro yet. no increase sob. self titrating prednisone from 40 down to floor of 5 mg and on 20 mg per day. No change rx    01/18/2011 ov post hosp for mrsa infection and L ankle followed by Ranell Patrick. No change rx.    03/02/2011 ov / CPX  No new complaints. rec follow med calendar, no change in rx  05/25/2011 f/u ov/[name] cc low energy better with higher doses of pred but back down to 5 mg daily. Green sputum better with cipro but never clears.  No sob over baseline >>set steroid floor at 10mg    06/20/2011 Acute OV  Complains of sinus headaches , fever,  generalized fatigue, prod cough (green), sinus drainage  (clear) - PT had increased PRed to 40mg  1 week ago and is back down to 20 mg now  . Still coughing and  congestion. Cough is not getting better. Last abx was in April this year.  No hemoptysis , no fever or chest pain. No edema.  >>no changes   08/25/2011 Acute OV  Complains of increased SOB, wheezing, increased SOB for 1 week. Breathing not great for last 2 months, feels like slowly progression of dyspnea. Took steroid burst -- finished pred taper 4days- now back to 10mg  daily Took Cipro last week- not much help.  Mother died last month at age 56,  Under a lot of stress.  Cough and congestion are getting worse.     Allergies  1) ! Iodine  2) Sulfamethoxazole (Sulfamethoxazole)  3) Erythromycin Ethylsuccinate    Past Medical History:  HYPOTHYROIDISM (ICD-244.9)  BRONCHIECTASIS W/ACUTE EXACERBATION (ICD-494.1)  - Xolair attempted 6/04 -> 9/05 no benefit - Immunotherapy stopped 8/09  - Started xyflo 8/09 > 9/09 no benefit  - VEST 2008 no benefit  - Flutter valve helpful March 17, 2009  - Allergy profile April 12, 2010 >> IgE 30 unremarkable  - Dulera 200 added November 22, 2010  Could not tol thrush HYPERLIPIDEMIA (ICD-272.4)  - Target LDL < 70 due to prev TIA's  TRANSIENT ISCHEMIC ATTACKS, HX OF (ICD-V12.50).............................................Marland KitchenReynolds  OSTEOPOROSIS (ICD-733.00)  - Reclast January 2009, 2010, October 26, 2009 , 11/2010  - Bone Density 08/05/09  Spine 0.1 Left -2.2, Right -1.6  Health Maintenance...................................................................................................Marland KitchenWert  -Td 11/2003 -Pneumovax 11/04 and 9/09  -Med calendar 12/24/08  And 03/01/11 -CPX 03/02/11 -GYN health maint...........................................................................Marland KitchenDickstein's practice Asthma  Diverticulosis  - Colonoscopy 01/01/09.....................................................................Marland KitchenRussella Dar  GERD  Irritable Bowel Syndrome  Hemorrhoids  R Shoulder  Pain..................................................................................[name]  Hypothyroid    Social History She never married. No kids. Carried for her parents all her life Mother passed 06/2011  She has a gentlemen friend that she has been with since she was 39  (He is now 12 but she never wanted to marry him).  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   ROS:  Constitutional:   No  weight loss, night sweats,  Fevers, chills,  +fatigue, or  lassitude.  HEENT:   No headaches,  Difficulty swallowing,  Tooth/dental problems, or  Sore throat,    CV:  No chest pain,  Orthopnea, PND, swelling in lower extremities, anasarca, dizziness, palpitations, syncope.   GI  No heartburn, indigestion, abdominal pain, nausea, vomiting, diarrhea, change in bowel habits, loss of appetite, bloody stools.   Resp: .   No wheezing.  No chest wall deformity  Skin: no rash or lesions.  GU: no dysuria, change in color of urine, no urgency or frequency.  No flank pain, no hematuria   MS:  No joint pain or swelling.  No decreased range of motion.  No back pain.  Psych:  No change in mood or affect. No depression or anxiety.  No memory loss.            Objective:   Physical Exam    wt 105 January 07, 2009 >   > 114 November 19, 2009 > 117 January 11, 2010 > 05/25/2011  110 >>113 06/20/2011 >116 08/25/2011  amb wf nad  HEENT: nl dentition, turbinates, and orophanx  Nasal clear drainage. . Nl external ear canals without cough reflex  NECK : without JVD/Nodes/TM/ nl carotid upstrokes bilaterally  LUNGS: no acc muscle use, coarse BS with scattered rhonchi  CV: RRR no s3 or murmur or increase in P2, no edema  ABD: soft and nontender with nl excursion in the supine position. No bruits or organomegaly, bowel sounds nl  MS: warm without deformities, calf tenderness, cyanosis or clubbing Neuro alert, nl gait, no focal deficits, intact sensorium and recall  Pedal pulses  present bilaterally and sym  Assessment:         Plan:

## 2011-08-25 NOTE — Patient Instructions (Signed)
Levaquin 500mg  daily for 10 days  Increase Prednisone 20mg  daily until seen back in office  Begin Arcapta 1 puff daily -brush/rinse / and gargle after use  follow up Dr. [name]  In 2 weeks and As needed   Please contact office for sooner follow up if symptoms do not improve or worsen or seek emergency care

## 2011-08-26 ENCOUNTER — Telehealth: Payer: Self-pay | Admitting: Internal Medicine

## 2011-08-26 MED ORDER — LEVOFLOXACIN 500 MG PO TABS: 500.0000 mg | ORAL_TABLET | Freq: Every day | ORAL | Status: DC

## 2011-08-26 MED ORDER — INDACATEROL MALEATE 75 MCG IN CAPS: 1.0000 | ORAL_CAPSULE | Freq: Every day | RESPIRATORY_TRACT | Status: DC

## 2011-08-26 NOTE — Telephone Encounter (Signed)
Rx for Levaquin 500mg  one daily for 10 days was sent to [facility] [address]  Pt informed.

## 2011-08-26 NOTE — Assessment & Plan Note (Signed)
Recurrent exacerbation  Will send sputum cx today  Trial of LABA   Plan:  Levaquin 500mg  daily for 10 days  Increase Prednisone 20mg  daily until seen back in office  Begin Arcapta 1 puff daily -brush/rinse / and gargle after use  follow up Dr. [name]  In 2 weeks and As needed   Please contact office for sooner follow up if symptoms do not improve or worsen or seek emergency care

## 2011-08-29 ENCOUNTER — Telehealth: Payer: Self-pay | Admitting: Internal Medicine

## 2011-08-29 ENCOUNTER — Encounter: Payer: Self-pay | Admitting: Adult Health

## 2011-08-29 LAB — RESPIRATORY CULTURE OR RESPIRATORY AND SPUTUM CULTURE

## 2011-08-29 NOTE — Telephone Encounter (Signed)
[email or web address]  

## 2011-08-30 ENCOUNTER — Ambulatory Visit: Payer: Medicare Other | Admitting: Internal Medicine

## 2011-08-30 ENCOUNTER — Other Ambulatory Visit: Payer: Self-pay | Admitting: Adult Health

## 2011-08-30 ENCOUNTER — Other Ambulatory Visit: Payer: Self-pay | Admitting: Internal Medicine

## 2011-08-30 MED ORDER — CICLESONIDE 50 MCG/ACT NA SUSP: NASAL | Status: DC

## 2011-08-30 MED ORDER — AMOXICILLIN-POT CLAVULANATE 875-125 MG PO TABS: 1.0000 | ORAL_TABLET | Freq: Two times a day (BID) | ORAL | Status: AC

## 2011-08-30 NOTE — Telephone Encounter (Signed)
That is fine, can send

## 2011-08-30 NOTE — Telephone Encounter (Signed)
Pt seen TP last week and had discussed TP prescribing Omnaris for her  Because pt no longer sees Dr Lucie Leather anymore.  Please advise if we can call in [facility] for pt and what directions.  [facility] [address] - Can leave a message for pt when done.

## 2011-08-30 NOTE — Telephone Encounter (Signed)
rx sent to [facility] [address] - left message on machine informing patient of this.  Will sign off.

## 2011-08-30 NOTE — Telephone Encounter (Signed)
[email or web address]  

## 2011-08-30 NOTE — Progress Notes (Signed)
Per 11.29.12 result note for sputum culture, abx changed to augmentin 875mg  #28, 1 po bid x2 weeks.  rx telephoned to pharmacy.  Pt is aware fo rx change.

## 2011-08-31 ENCOUNTER — Telehealth: Payer: Self-pay | Admitting: Adult Health

## 2011-08-31 NOTE — Telephone Encounter (Signed)
Pt states she needs PA for her Omnaris. I have called the pharmacy and they will be faxing over Pa form so we may initiate this for the patient. I have left a sample of the Omnaris up front for the pt to pick up. The patient has already tried and failed Nasonex and Flonase. Will hold msg in triage until PA form is received.

## 2011-09-01 NOTE — Telephone Encounter (Signed)
I called and initiated PA  Through [employer] and was approved x 1 year. They will fax over approval. [facility] and made them aware of this and states they will run it through. LMOMTCB to make pt aware

## 2011-09-01 NOTE — Telephone Encounter (Signed)
Spoke with pt and notified omnaris approved x 1 yr. Pt verbalized understanding. States nothing further needed.

## 2011-09-06 ENCOUNTER — Other Ambulatory Visit (INDEPENDENT_AMBULATORY_CARE_PROVIDER_SITE_OTHER): Payer: Medicare Other

## 2011-09-06 ENCOUNTER — Encounter: Payer: Self-pay | Admitting: Gastroenterology

## 2011-09-06 ENCOUNTER — Ambulatory Visit (INDEPENDENT_AMBULATORY_CARE_PROVIDER_SITE_OTHER): Payer: Medicare Other | Admitting: Gastroenterology

## 2011-09-06 ENCOUNTER — Other Ambulatory Visit: Payer: Self-pay | Admitting: Internal Medicine

## 2011-09-06 VITALS — BP 118/60 | HR 80 | Ht 62.0 in | Wt 114.6 lb

## 2011-09-06 DIAGNOSIS — K921 Melena: Secondary | ICD-10-CM

## 2011-09-06 DIAGNOSIS — R1032 Left lower quadrant pain: Secondary | ICD-10-CM

## 2011-09-06 LAB — CBC WITH DIFFERENTIAL/PLATELET
Basophils Relative: 0 % (ref 0.0–3.0)
Eosinophils Absolute: 0 10*3/uL (ref 0.0–0.7)
Eosinophils Relative: 0 % (ref 0.0–5.0)
Hemoglobin: 11.9 g/dL — ABNORMAL LOW (ref 12.0–15.0)
Lymphocytes Relative: 3.2 % — ABNORMAL LOW (ref 12.0–46.0)
MCHC: 33.3 g/dL (ref 30.0–36.0)
MCV: 83 fl (ref 78.0–100.0)
Neutro Abs: 11.7 10*3/uL — ABNORMAL HIGH (ref 1.4–7.7)
RBC: 4.31 Mil/uL (ref 3.87–5.11)

## 2011-09-06 LAB — BASIC METABOLIC PANEL
BUN: 9 mg/dL (ref 6–23)
Chloride: 102 mEq/L (ref 96–112)
Creatinine, Ser: 0.9 mg/dL (ref 0.4–1.2)
Glucose, Bld: 171 mg/dL — ABNORMAL HIGH (ref 70–99)

## 2011-09-06 NOTE — Patient Instructions (Addendum)
Go directly to the basement to have your labs drawn today. Please call us back if your pain or bleeding does not resolve over the next several days.  cc: [name], MD

## 2011-09-06 NOTE — Progress Notes (Addendum)
History of Present Illness: This is a 77 year old female who relates the acute onset of left lower quadrant abdominal pain on Saturday this was followed by diarrhea and then bloody diarrhea. The abdominal pain has almost completely resolved, and her diarrhea has resolved. She still notes a small amount of dark red blood on the tissue paper for the past 2 days. She began a course of Augmentin for recurrent lung infections last Wednesday. She denies fevers or chills. She stated the pain was so severe initially that she briefly fell to the floor. She has not had any orthostatic symptoms at that time. Her appetite has been good. Denies weight loss, constipation, diarrhea, change in stool caliber, melena, nausea, vomiting, dysphagia, reflux symptoms, chest pain.  Current Medications, Allergies, Past Medical History, Past Surgical History, Family History and Social History were reviewed in [employer] record.  Physical Exam: General: Well developed , well nourished, no acute distress Head: Normocephalic and atraumatic Eyes:  sclerae anicteric, EOMI Ears: Normal auditory acuity Mouth: No deformity or lesions Lungs: Decreased breath sounds Heart: Regular rate and rhythm; no murmurs, rubs or bruits Abdomen: Soft, mild LLQ tenderness without rebound or guarding, and non distended. No masses, hepatosplenomegaly or hernias noted. Normal Bowel sounds Rectal: dark maroon stool, 3+ heme +, no lesions Musculoskeletal: Symmetrical with no gross deformities  Pulses:  Normal pulses noted Extremities: No clubbing, cyanosis, edema or deformities noted Neurological: Alert oriented x 4, grossly nonfocal Psychological:  Alert and cooperative. Normal mood and affect  Assessment and Recommendations:  1. Suspected ischemic or other self limited colitis. Avoid decongestants if possible and maintain adequate hydration. Abd/pelvic CT if symptoms do not rapidly resolve. CBC and BMET today. If her  rectal bleeding does not resolve consider further evaluation with colonoscopy however she is at higher risk for procedure and sedation related complications due to her comorbidities.

## 2011-09-07 ENCOUNTER — Ambulatory Visit: Payer: Medicare Other | Admitting: Adult Health

## 2011-09-08 ENCOUNTER — Telehealth: Payer: Self-pay | Admitting: Internal Medicine

## 2011-09-08 ENCOUNTER — Telehealth: Payer: Self-pay

## 2011-09-08 DIAGNOSIS — D72829 Elevated white blood cell count, unspecified: Secondary | ICD-10-CM

## 2011-09-08 MED ORDER — LEVOTHYROXINE SODIUM 100 MCG PO TABS: 100.0000 ug | ORAL_TABLET | Freq: Every day | ORAL | Status: DC

## 2011-09-08 NOTE — Telephone Encounter (Signed)
PA not need for medication-its a refill approval the pharmacy needed; I gave verbal order to give refill this time only and patient understands to keep appt with MW on 09-21-11 to get more refills.

## 2011-09-08 NOTE — Telephone Encounter (Signed)
Message copied by Jessee Avers on Thu Sep 08, 2011  2:22 PM ------      Message from: Claudette Head T      Created: Tue Sep 06, 2011  8:43 PM       Mildly elevated WBC and glucose      Hb is stable      CBC and fasting glucose in one week

## 2011-09-08 NOTE — Telephone Encounter (Signed)
Pt notified of lab results. Pt states she will come in next Thursday to get labs rechecked.

## 2011-09-15 ENCOUNTER — Other Ambulatory Visit (INDEPENDENT_AMBULATORY_CARE_PROVIDER_SITE_OTHER): Payer: Medicare Other

## 2011-09-15 DIAGNOSIS — D72829 Elevated white blood cell count, unspecified: Secondary | ICD-10-CM

## 2011-09-15 DIAGNOSIS — R7309 Other abnormal glucose: Secondary | ICD-10-CM

## 2011-09-15 LAB — CBC WITH DIFFERENTIAL/PLATELET
Basophils Absolute: 0.1 10*3/uL (ref 0.0–0.1)
Eosinophils Absolute: 0.2 10*3/uL (ref 0.0–0.7)
Eosinophils Relative: 1.7 % (ref 0.0–5.0)
Lymphs Abs: 3.2 10*3/uL (ref 0.7–4.0)
MCHC: 32.6 g/dL (ref 30.0–36.0)
MCV: 84.1 fl (ref 78.0–100.0)
Monocytes Absolute: 1.1 10*3/uL — ABNORMAL HIGH (ref 0.1–1.0)
Neutrophils Relative %: 62.1 % (ref 43.0–77.0)
Platelets: 446 10*3/uL — ABNORMAL HIGH (ref 150.0–400.0)
RDW: 15.7 % — ABNORMAL HIGH (ref 11.5–14.6)
WBC: 12.1 10*3/uL — ABNORMAL HIGH (ref 4.5–10.5)

## 2011-09-15 LAB — GLUCOSE, RANDOM: Glucose, Bld: 78 mg/dL (ref 70–99)

## 2011-09-16 ENCOUNTER — Other Ambulatory Visit: Payer: Self-pay

## 2011-09-16 DIAGNOSIS — D72829 Elevated white blood cell count, unspecified: Secondary | ICD-10-CM

## 2011-09-21 ENCOUNTER — Encounter: Payer: Self-pay | Admitting: Internal Medicine

## 2011-09-21 ENCOUNTER — Ambulatory Visit (INDEPENDENT_AMBULATORY_CARE_PROVIDER_SITE_OTHER): Payer: Medicare Other | Admitting: Internal Medicine

## 2011-09-21 ENCOUNTER — Ambulatory Visit (INDEPENDENT_AMBULATORY_CARE_PROVIDER_SITE_OTHER)
Admission: RE | Admit: 2011-09-21 | Discharge: 2011-09-21 | Disposition: A | Discharge status: Home / Self Care | Payer: Medicare Other | Source: Ambulatory Visit | Attending: Internal Medicine | Admitting: Internal Medicine

## 2011-09-21 VITALS — BP 110/70 | HR 88 | Temp 98.5°F | Ht 62.0 in | Wt 116.4 lb

## 2011-09-21 DIAGNOSIS — J471 Bronchiectasis with (acute) exacerbation: Secondary | ICD-10-CM

## 2011-09-21 DIAGNOSIS — J479 Bronchiectasis, uncomplicated: Secondary | ICD-10-CM

## 2011-09-21 DIAGNOSIS — M810 Age-related osteoporosis without current pathological fracture: Secondary | ICD-10-CM

## 2011-09-21 MED ORDER — CEFDINIR 300 MG PO CAPS: 300.0000 mg | ORAL_CAPSULE | Freq: Two times a day (BID) | ORAL | Status: AC

## 2011-09-21 NOTE — Assessment & Plan Note (Signed)
DDX of  difficult airways managment all start with A and  include Adherence, Ace Inhibitors, Acid Reflux, Active Sinus Disease, Alpha 1 Antitripsin deficiency, Anxiety masquerading as Airways dz,  ABPA,  allergy(esp in young), Aspiration (esp in elderly), Adverse effects of DPI,  Active smokers, plus two Bs  = Bronchiectasis and Beta blocker use..and one C= CHF  Adherence is always the initial "prime suspect" and is a multilayered concern that requires a "trust but verify" approach in every patient - starting with knowing how to use medications, especially inhalers, correctly, keeping up with refills and understanding the fundamental difference between maintenance and prns vs those medications only taken for a very short course and then stopped and not refilled. No longer has med calendar or following action plans, reviewed  Bronchiectasis confirmed by ct but not benefit from vest, best bet fultter valve, ok to use augmentin for flares  See instructions for specific recommendations which were reviewed directly with the patient who was given a copy with highlighter outlining the key components.

## 2011-09-21 NOTE — Assessment & Plan Note (Addendum)
Continue yearly reclast Needs recheck vit d next ov

## 2011-09-21 NOTE — Patient Instructions (Addendum)
See [name] w/in 2 weeks with all your medications, even over the counter meds, separated in two separate bags, the ones you take no matter what vs the ones you stop once you feel better and take only as needed when you feel you need them.   Tammy  will generate for you a new user friendly medication calendar that will put [place] all on the same page re: your medication use.     Without this process, it simply isn't possible to assure that we are providing  your outpatient care  with  the attention to detail we feel you deserve.   If we cannot assure that you're getting that kind of care,  then we cannot manage your problem effectively from this clinic.  Once you have seen Tammy and we are sure that we're all on the same page with your medication use she will arrange follow up with me.   Try taking afrin 1-2 min before omnaris and reduce to one at bedtime as you improve.  Afrin x 5 days then stop for five days  Please remember to go to  x-ray department downstairs for your tests - we will call you with the results when they are available.     Late addd: sputum from 11/29 growing e coli  Resistant to cipro and intermdiate to augmentin so next flare try omnicef x 10 days, discussed with pt by phone Needs Vit D level next ov

## 2011-09-21 NOTE — Progress Notes (Signed)
Patient ID: [name], female   DOB: Feb 13, 1933, 77 y.o.   MRN: [id number] Subjective:     Patient ID: [name], female   DOB: [birth year], 77 y.o.   MRN: [id number]  HPI  75 yowf never smoker with documented chronic sinusitis /bronchiectasis and a tendency to recurrent purulent exacerbations, has been self titrating prednisone with a ceiling of 40 mg per day and a floor of 5 extensively evaluated at [facility] and by [name] with neg w/u x atopic.   October 26, 2009 ov/  follow up and discuss Reclast. With osteopenia w/ last BMD 11/10 w/ Tscore of -2.2. intolerant to Bisphosphnates, and has received Reclast x 2 infusions with no known difficulties. She is high risk for fx d/t chronic steroids.  03/02/2011 ov / CPX  No new complaints. rec follow med calendar, no change in rx    06/20/2011 Acute OV  cc sinus headaches , fever,  generalized fatigue, prod cough (green), sinus drainage  (clear) - PT had increased PRed to 40mg  1 week ago and is back down to 20 mg now  . Still coughing and congestion. Cough is not getting better. Last abx was in April this year.  rec Levaquin 500mg  daily for 10 days  Increase Prednisone 20mg  daily until seen back in office  Begin Arcapta 1 puff daily -brush/rinse / and gargle after use > cough worse stopped  09/21/2011 f/u ov/Nicolette Gieske ? Lost med calendar tapered prednisone to 20 mg per day now cc 3 week  . c/o head congestion, headache, wheezing, cough with green mucus. - no sob. Not using afrin or vermyst as per previous calendar "action plan" - no extra daytime saba use   Sleeping ok without nocturnal  or early am exacerbation  of respiratory  c/o's or need for noct saba. Also denies any obvious fluctuation of symptoms with weather or environmental changes or other aggravating or alleviating factors except as outlined above        Allergies  1) ! Iodine  2) Sulfamethoxazole (Sulfamethoxazole)  3) Erythromycin Ethylsuccinate    Past Medical  History:  HYPOTHYROIDISM (ICD-244.9)  BRONCHIECTASIS W/ACUTE EXACERBATION (ICD-494.1)  - Xolair attempted 6/04 -> 9/05 no benefit - Immunotherapy stopped 8/09  - Started xyflo 8/09 > 9/09 no benefit  - VEST 2008 as inpt > no benefit  - Flutter valve helpful March 17, 2009  - Allergy profile April 12, 2010 >> IgE 30 unremarkable  - Dulera 200 added November 22, 2010  Could not tol thrush HYPERLIPIDEMIA (ICD-272.4)  - Target LDL < 70 due to prev TIA's  TRANSIENT ISCHEMIC ATTACKS, HX OF (ICD-V12.50).............................................[name]  OSTEOPOROSIS (ICD-733.00)  - Reclast January 2009, 2010, October 26, 2009 , 11/2010  - Bone Density 08/05/09 Spine 0.1 Left -2.2, Right -1.6  Health Maintenance...................................................................................................[name]  -Td 11/2003 -Pneumovax 11/04 and 9/09  -Med calendar 12/24/08  And 03/01/11 -CPX 03/02/11 -GYN health maint...........................................................................[name] Asthma  Diverticulosis  - Colonoscopy 01/01/09.....................................................................[name] KitchenRussella Dar  GERD  Irritable Bowel Syndrome  Hemorrhoids  R Shoulder Pain..................................................................................[name] KitchenKendal/ Charlann [name]  Hypothyroid    Social History She never married. No kids. Carried for her parents all her life but mother now terminal in snf  She has a gentlemen friend that she has been with since she was 41  (He is now 21 but she never wanted to marry him).  Her mother died 2011-07-26 She was a Engineer, water work.  Objective:   Physical Exam    wt 105 January 07, 2009 >   > 114 November 19, 2009 > 117 January 11, 2010 > 05/25/2011  110 >>113 06/20/2011  > 09/21/2011 116  amb wf nad somber but overtly depressed HEENT: nl dentition, turbinates, and orophanx  Nasal clear drainage. .  Nl external ear canals without cough reflex  NECK : without JVD/Nodes/TM/ nl carotid upstrokes bilaterally  LUNGS: no acc muscle use, pan exp sonorous rhonchi  CV: RRR no s3 or murmur or increase in P2, no edema  ABD: soft and nontender with nl excursion in the supine position. No bruits or organomegaly, bowel sounds nl  MS: warm without deformities, calf tenderness, cyanosis or clubbing Neuro alert, nl gait, no focal deficits, intact sensorium and recall  Pedal pulses present bilaterally and sym  CXR  09/21/2011 :  1. Advanced interstitial lung disease is unchanged from prior study.    Assessment:         Plan:

## 2011-09-27 ENCOUNTER — Other Ambulatory Visit: Payer: Self-pay | Admitting: Internal Medicine

## 2011-10-06 ENCOUNTER — Ambulatory Visit (INDEPENDENT_AMBULATORY_CARE_PROVIDER_SITE_OTHER): Payer: Medicare Other | Admitting: Adult Health

## 2011-10-06 ENCOUNTER — Encounter: Payer: Self-pay | Admitting: Adult Health

## 2011-10-06 DIAGNOSIS — F3289 Other specified depressive episodes: Secondary | ICD-10-CM

## 2011-10-06 DIAGNOSIS — F32A Depression, unspecified: Secondary | ICD-10-CM | POA: Insufficient documentation

## 2011-10-06 DIAGNOSIS — F329 Major depressive disorder, single episode, unspecified: Secondary | ICD-10-CM

## 2011-10-06 DIAGNOSIS — J479 Bronchiectasis, uncomplicated: Secondary | ICD-10-CM

## 2011-10-06 MED ORDER — SERTRALINE HCL 25 MG PO TABS: 25.0000 mg | ORAL_TABLET | Freq: Every day | ORAL | Status: DC

## 2011-10-06 MED ORDER — CEFDINIR 300 MG PO CAPS: 300.0000 mg | ORAL_CAPSULE | Freq: Two times a day (BID) | ORAL | Status: AC

## 2011-10-06 NOTE — Assessment & Plan Note (Signed)
Compensated on present regimen.  Resolved recent flare

## 2011-10-06 NOTE — Patient Instructions (Signed)
Begin Zoloft 25mg  daily  Sent antibiotic to pharmacy to have on hold if needed--follow med calendar for direction  Follow med calendar closely and bring to each visit.  follow up Dr. [name]  In 3 months and As needed
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
## 2011-10-06 NOTE — Assessment & Plan Note (Signed)
Trial of Zoloft 25mg  daily  Stress reducers discussed

## 2011-10-10 MED ORDER — POTASSIUM CHLORIDE CRYS ER 20 MEQ PO TBCR: 20.0000 meq | EXTENDED_RELEASE_TABLET | Freq: Two times a day (BID) | ORAL | Status: DC

## 2011-10-10 MED ORDER — ESTRADIOL 0.025 MG/24HR TD PTTW: MEDICATED_PATCH | TRANSDERMAL | Status: DC

## 2011-10-10 MED ORDER — OXYMETAZOLINE HCL 0.05 % NA SOLN: 1.0000 | Freq: Two times a day (BID) | NASAL | Status: AC

## 2011-10-10 MED ORDER — GUAIFENESIN ER 600 MG PO TB12
600.0000 mg | ORAL_TABLET | Freq: Two times a day (BID) | ORAL | Status: DC | PRN
Start: 2011-10-10 — End: 2012-04-29

## 2011-10-10 MED ORDER — PROGESTERONE MICRONIZED 100 MG PO CAPS: 200.0000 mg | ORAL_CAPSULE | Freq: Every day | ORAL | Status: DC

## 2011-10-10 MED ORDER — ALBUTEROL SULFATE (2.5 MG/3ML) 0.083% IN NEBU: 2.5000 mg | INHALATION_SOLUTION | Freq: Three times a day (TID) | RESPIRATORY_TRACT | Status: DC | PRN

## 2011-10-10 MED ORDER — METHYLCELLULOSE (LAXATIVE) PO POWD: ORAL | Status: DC

## 2011-10-10 NOTE — Progress Notes (Signed)
Addended by: Boone Master E on: 10/10/2011 12:50 PM   Modules accepted: Orders

## 2011-10-19 ENCOUNTER — Telehealth: Payer: Self-pay | Admitting: Internal Medicine

## 2011-10-19 NOTE — Telephone Encounter (Signed)
Called spoke with Marylene Land @ [facility] who stated pt's albuterol neb soln was filed under medicare part D, but they have no part B info filed on her.  Pt will need to take that information to [facility] in order to file her neb medication.  ATC x1 pt to inform her of this, but NA and no option to LM.

## 2011-10-20 NOTE — Telephone Encounter (Signed)
I advised the pt and she will take th necessary info to the pharmacy. Carron Curie, CMA

## 2011-11-04 ENCOUNTER — Encounter: Payer: Self-pay | Admitting: Internal Medicine

## 2011-11-04 ENCOUNTER — Ambulatory Visit (INDEPENDENT_AMBULATORY_CARE_PROVIDER_SITE_OTHER): Payer: Medicare Other | Admitting: Internal Medicine

## 2011-11-04 DIAGNOSIS — J479 Bronchiectasis, uncomplicated: Secondary | ICD-10-CM

## 2011-11-04 DIAGNOSIS — J310 Chronic rhinitis: Secondary | ICD-10-CM
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
## 2011-11-04 NOTE — Patient Instructions (Signed)
Ceiling for prednisone is 40 mg per day and floor is 10 mg as per calendar Omnaris should always be used 1 puff at bedtime even when better and 2 puffs every 12 hours with aftrin x 5 days when worse See calendar for specific medication instructions and bring it back for each and every office visit for every healthcare provider you see.  Without it,  you may not receive the best quality medical care that we feel you deserve.  You will note that the calendar groups together  your maintenance  medications that are timed at particular times of the day.  Think of this as your checklist for what your doctor has instructed you to do until your next evaluation to see what benefit  there is  to staying on a consistent group of medications intended to keep you well.  The other group at the bottom is entirely up to you to use as you see fit  for specific symptoms that may arise between visits that require you to treat them on an as needed basis.  Think of this as your action plan or "what if" list.   Separating the top medications from the bottom group is fundamental to providing you adequate care going forward.    Please schedule a follow up visit in 3 months but call sooner if needed

## 2011-11-05 ENCOUNTER — Encounter: Payer: Self-pay | Admitting: Internal Medicine

## 2011-11-05 DIAGNOSIS — J310 Chronic rhinitis: Secondary | ICD-10-CM | POA: Insufficient documentation

## 2011-11-05 NOTE — Assessment & Plan Note (Signed)
-   Xolair attempted 6/04 -> 9/05  - Immunotherapy stopped 8/09  - Started xyflo 8/09 > 9/09 no benefit  - VEST 2008 no benefit  - Flutter valve helpful March 17, 2009  - Allergy profile April 12, 2010 >> IgE 30 unremarkable  - Dulera 200 added November 22, 2010 > could not tolerate -Sputum cx 08/29/2011 +ESCHERICHIA COLI mixed sensitivities > flare tx w/ augmentin (d/t sulfa all)     Each maintenance medication was reviewed in detail including most importantly the difference between maintenance and as needed and under what circumstances the prns are to be used. This was done in the context of a medication calendar review which provided the patient with a user-friendly unambiguous mechanism for medication administration and reconciliation and provides an action plan for all active problems. It is critical that this be shown to every doctor  for modification during the office visit if necessary so the patient can use it as a working document.

## 2011-11-05 NOTE — Assessment & Plan Note (Signed)
I emphasized that nasal steroids have no immediate benefit in terms of improving symptoms.  To help them reached the target tissue, the patient should use Afrin two puffs every 12 hours applied one min before using the nasal steroids.  Afrin should be stopped after no more than 5 days.  If the symptoms worsen, Afrin can be restarted after 5 days off of therapy to prevent rebound congestion from overuse of Afrin.  I also emphasized that in no way are nasal steroids a concern in terms of "addiction".  Henderson Newcomer minimum should be one at bedtime as reviewed with her on her med calendar

## 2011-11-08 ENCOUNTER — Other Ambulatory Visit: Payer: Self-pay | Admitting: Internal Medicine

## 2011-12-19 ENCOUNTER — Ambulatory Visit (INDEPENDENT_AMBULATORY_CARE_PROVIDER_SITE_OTHER): Payer: Medicare Other | Admitting: Adult Health

## 2011-12-19 ENCOUNTER — Encounter: Payer: Self-pay | Admitting: Adult Health

## 2011-12-19 VITALS — BP 132/74 | HR 82 | Temp 97.0°F | Ht 62.0 in | Wt 115.4 lb

## 2011-12-19 DIAGNOSIS — M810 Age-related osteoporosis without current pathological fracture: Secondary | ICD-10-CM

## 2011-12-19 MED ORDER — CEFDINIR 300 MG PO CAPS: 300.0000 mg | ORAL_CAPSULE | Freq: Two times a day (BID) | ORAL | Status: AC

## 2011-12-19 NOTE — Assessment & Plan Note (Signed)
Patient has a history of osteoporosis with a previous bone density score of -2.2 in November of 2010. She has been on yearly  Reclast .  Paperwork has been started for her infusion. Labs are pending today. She is continued on Os-Cal twice daily.
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
## 2011-12-19 NOTE — Assessment & Plan Note (Signed)
Acute exacerbation of bronchiectasis. Patient's begin Omnicef x10 days. May use Mucinex DM twice daily as needed. Patient to follow with Dr. Sharee Pimple in 3 months for her physical and as needed.

## 2011-12-19 NOTE — Patient Instructions (Signed)
We are setting you up for Reclast  Labs today.  Continue on Oscal Twice daily   Omnicef Twice daily  For 10 days  Follow med calendar for cough control.  Follow up with Dr. [name]  3 months for physical and As needed

## 2011-12-20 ENCOUNTER — Telehealth: Payer: Self-pay | Admitting: Internal Medicine

## 2011-12-20 NOTE — Telephone Encounter (Signed)
Called, spoke with pt.  States she forgot to stop by the lab yesterday.  I advised the labs are still in.  Pt states she will come by sometime tomorrow for this.  Will sign off of phone msg and send to TP as FYI.

## 2011-12-21 ENCOUNTER — Other Ambulatory Visit (INDEPENDENT_AMBULATORY_CARE_PROVIDER_SITE_OTHER): Payer: Medicare Other

## 2011-12-21 DIAGNOSIS — M810 Age-related osteoporosis without current pathological fracture: Secondary | ICD-10-CM

## 2011-12-21 LAB — BASIC METABOLIC PANEL
CO2: 29 mEq/L (ref 19–32)
Calcium: 8.5 mg/dL (ref 8.4–10.5)
Chloride: 96 mEq/L (ref 96–112)
Creatinine, Ser: 0.8 mg/dL (ref 0.4–1.2)
Glucose, Bld: 84 mg/dL (ref 70–99)

## 2011-12-27 ENCOUNTER — Encounter: Payer: Self-pay | Admitting: Adult Health

## 2011-12-27 ENCOUNTER — Other Ambulatory Visit: Payer: Self-pay | Admitting: Adult Health

## 2011-12-29 ENCOUNTER — Telehealth: Payer: Self-pay | Admitting: Internal Medicine

## 2011-12-29 NOTE — Telephone Encounter (Signed)
Spoke with pt and she states needs new med cal mailed to her. She lost hers. JJ aware and will forward her msg so she can mail one to the pt. I verified her address and already gave JJ envelope.

## 2011-12-29 NOTE — Telephone Encounter (Signed)
Med calendar printed, placed in addressed envelope and put on the dumb waiter.  Pt is aware and will call if anything further is needed.  Will sign off.

## 2012-01-03 ENCOUNTER — Other Ambulatory Visit: Payer: Self-pay | Admitting: Internal Medicine

## 2012-01-09 ENCOUNTER — Encounter: Payer: Self-pay | Admitting: Adult Health

## 2012-01-09 ENCOUNTER — Ambulatory Visit: Payer: Medicare Other | Admitting: Internal Medicine

## 2012-01-09 ENCOUNTER — Ambulatory Visit (INDEPENDENT_AMBULATORY_CARE_PROVIDER_SITE_OTHER): Payer: Medicare Other | Admitting: Adult Health

## 2012-01-09 VITALS — BP 122/66 | HR 75 | Temp 98.0°F | Ht 62.0 in | Wt 118.2 lb

## 2012-01-09 DIAGNOSIS — J471 Bronchiectasis with (acute) exacerbation: Secondary | ICD-10-CM

## 2012-01-09 MED ORDER — AMOXICILLIN-POT CLAVULANATE 875-125 MG PO TABS: 1.0000 | ORAL_TABLET | Freq: Two times a day (BID) | ORAL | Status: AC

## 2012-01-09 NOTE — Assessment & Plan Note (Signed)
Recurrent Flare   Plan;  Augmentin 875mg  Twice daily  For 10 days -take w/ food  Mucinex DM Twice daily  As needed  Cough/congestion   Can take Align Probiotic daily to avoid diarrhea while on antibiotics.  follow up Dr. [name]  In 2 months for Physical  Fluids and rest  Please contact office for sooner follow up if symptoms do not improve or worsen or seek emergency care
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
## 2012-01-09 NOTE — Patient Instructions (Signed)
Augmentin 875mg  Twice daily  For 10 days -take w/ food  Mucinex DM Twice daily  As needed  Cough/congestion   Can take Align Probiotic daily to avoid diarrhea while on antibiotics.  follow up Dr. [name]  In 2 months for Physical  Fluids and rest  Please contact office for sooner follow up if symptoms do not improve or worsen or seek emergency care

## 2012-01-10 ENCOUNTER — Encounter (HOSPITAL_COMMUNITY)
Admission: RE | Admit: 2012-01-10 | Discharge: 2012-01-10 | Disposition: A | Discharge status: Home / Self Care | Payer: Medicare Other | Source: Ambulatory Visit | Attending: Internal Medicine | Admitting: Internal Medicine

## 2012-01-10 DIAGNOSIS — M810 Age-related osteoporosis without current pathological fracture: Secondary | ICD-10-CM | POA: Insufficient documentation

## 2012-01-10 MED ORDER — ZOLEDRONIC ACID 5 MG/100ML IV SOLN
5.0000 mg | Freq: Once | INTRAVENOUS | Status: AC
  Administered 2012-01-10: 5 mg via INTRAVENOUS
  Filled 2012-01-10: qty 100

## 2012-01-10 MED ORDER — SODIUM CHLORIDE 0.9 % IV SOLN
INTRAVENOUS | Status: DC
  Administered 2012-01-10: 11:00:00 via INTRAVENOUS

## 2012-01-10 NOTE — Discharge Instructions (Signed)
Drink  Fluids/water as tolerated over the next 72 hours Tylenol or ibuprofen OTC as directed Continue Calcium and Vit D as directed by your MD   RECLAST ZOLEDRONIC ACID (ZOE le dron ik AS id) lowers the amount of calcium loss from bone. It is used to treat Paget's disease and osteoporosis in women. This medicine may be used for other purposes; ask your health care provider or pharmacist if you have questions. What should I tell my health care provider before I take this medicine? They need to know if you have any of these conditions: -aspirin-sensitive asthma -dental disease -kidney disease -low levels of calcium in the blood -past surgery on the parathyroid gland or intestines -an unusual or allergic reaction to zoledronic acid, other medicines, foods, dyes, or preservatives -pregnant or trying to get pregnant -breast-feeding How should I use this medicine? This medicine is for infusion into a vein. It is given by a health care professional in a hospital or clinic setting. Talk to your pediatrician regarding the use of this medicine in children. This medicine is not approved for use in children. Overdosage: If you think you have taken too much of this medicine contact a poison control center or emergency room at once. NOTE: This medicine is only for you. Do not share this medicine with others. What if I miss a dose? It is important not to miss your dose. Call your doctor or health care professional if you are unable to keep an appointment. What may interact with this medicine? -certain antibiotics given by injection -NSAIDs, medicines for pain and inflammation, like ibuprofen or naproxen -some diuretics like bumetanide, furosemide -teriparatide This list may not describe all possible interactions. Give your health care provider a list of all the medicines, herbs, non-prescription drugs, or dietary supplements you use. Also tell them if you smoke, drink alcohol, or use illegal drugs.  Some items may interact with your medicine. What should I watch for while using this medicine? Visit your doctor or health care professional for regular checkups. It may be some time before you see the benefit from this medicine. Do not stop taking your medicine unless your doctor tells you to. Your doctor may order blood tests or other tests to see how you are doing. Women should inform their doctor if they wish to become pregnant or think they might be pregnant. There is a potential for serious side effects to an unborn child. Talk to your health care professional or pharmacist for more information. You should make sure that you get enough calcium and vitamin D while you are taking this medicine. Discuss the foods you eat and the vitamins you take with your health care professional. Some people who take this medicine have severe bone, joint, and/or muscle pain. This medicine may also increase your risk for a broken thigh bone. Tell your doctor right away if you have pain in your upper leg or groin. Tell your doctor if you have any pain that does not go away or that gets worse. What side effects may I notice from receiving this medicine? Side effects that you should report to your doctor or health care professional as soon as possible: -allergic reactions like skin rash, itching or hives, swelling of the face, lips, or tongue -breathing problems -changes in vision -feeling faint or lightheaded, falls -jaw burning, cramping, or pain -muscle cramps, stiffness, or weakness -trouble passing urine or change in the amount of urine Side effects that usually do not require medical attention (report   to your doctor or health care professional if they continue or are bothersome): -bone, joint, or muscle pain -fever -irritation at site where injected -loss of appetite -nausea, vomiting -stomach upset -tired This list may not describe all possible side effects. Call your doctor for medical advice about  side effects. You may report side effects to FDA at 1-800-FDA-1088. Where should I keep my medicine? This drug is given in a hospital or clinic and will not be stored at home. NOTE: This sheet is a summary. It may not cover all possible information. If you have questions about this medicine, talk to your doctor, pharmacist, or health care provider.  2012, [employer]. (03/11/2011 9:08:15 AM) 

## 2012-01-11 ENCOUNTER — Telehealth: Payer: Self-pay | Admitting: Internal Medicine

## 2012-01-11 NOTE — Telephone Encounter (Signed)
Onalee Hua with [employer] called back to state that he received ov notes today (dated 12-19-11 but per dx and ov notes this will throw testing out per medicare requirements. Caller needs ov notes (says you can go back as far as 12-16-10 for this). Fax # [phone]. Hazel Sams

## 2012-01-11 NOTE — Telephone Encounter (Signed)
I will forward this message to Alida because this is a CMN. Carron Curie, CMA

## 2012-01-12 ENCOUNTER — Telehealth: Payer: Self-pay | Admitting: Internal Medicine

## 2012-01-12 NOTE — Telephone Encounter (Signed)
dme ok to test

## 2012-01-12 NOTE — Telephone Encounter (Signed)
Spoke with Onalee Hua @ [facility], who says sat from 12/19/11 not accepted by medicare guidelines, because dx was bronchietasis with Acute exacerbation. Can accept any sats when pt was not having sx.  Refaxed 11/04/11 ov with qualifying documented sats,  Ov says bronchietasis Without acute exacerbation, spoke with Onalee Hua who said it sounds like it may Ok to fax. Kandice Hams

## 2012-01-12 NOTE — Telephone Encounter (Signed)
I spoke with pt and she states she is wanting to get a new portable oxygen and per her DME medicare is denying the request bc she needs to be retested for this. Do you want the dme to test pt or bring her in for OV Dr. [name], thanks

## 2012-01-13 NOTE — Telephone Encounter (Signed)
I spoke with pt and she states she spoke with her DME this morning and stated she does not need to be re tested bc they found additional information in her chart that got this approved for her. Will sign off message

## 2012-01-17 ENCOUNTER — Telehealth: Payer: Self-pay | Admitting: Internal Medicine

## 2012-01-17 NOTE — Telephone Encounter (Signed)
[name] with [employer] states he was faxed ov note from 11-04-11 but it states there are 5 pages and he only received 4 so he needs the fifth page. I have faxed this to [phone]. He also states that he faxed over an attestation form for Dr. [name] to sign because the oxygen test was not originally signed by the MD. He states he needs this signed and faxed back and all will be taken care of.  I could not locate this form so I requested he refax to traige fax. Form was received and given to Alida to process. Carron Curie, CMA

## 2012-01-17 NOTE — Telephone Encounter (Signed)
lmtcb on [name]'s named vm

## 2012-01-18 ENCOUNTER — Telehealth: Payer: Self-pay | Admitting: Internal Medicine

## 2012-01-18 NOTE — Telephone Encounter (Signed)
Attestation form signed by Dr [name] and faxed to [employer] [phone] .Kandice Hams

## 2012-01-24 ENCOUNTER — Other Ambulatory Visit: Payer: Self-pay | Admitting: Internal Medicine

## 2012-02-17 ENCOUNTER — Ambulatory Visit (INDEPENDENT_AMBULATORY_CARE_PROVIDER_SITE_OTHER): Payer: Medicare Other | Admitting: Internal Medicine

## 2012-02-17 ENCOUNTER — Other Ambulatory Visit (INDEPENDENT_AMBULATORY_CARE_PROVIDER_SITE_OTHER): Payer: Medicare Other

## 2012-02-17 ENCOUNTER — Ambulatory Visit (INDEPENDENT_AMBULATORY_CARE_PROVIDER_SITE_OTHER)
Admission: RE | Admit: 2012-02-17 | Discharge: 2012-02-17 | Disposition: A | Discharge status: Home / Self Care | Payer: Medicare Other | Source: Ambulatory Visit | Attending: Internal Medicine | Admitting: Internal Medicine

## 2012-02-17 ENCOUNTER — Encounter: Payer: Self-pay | Admitting: Internal Medicine

## 2012-02-17 VITALS — BP 140/80 | HR 82 | Temp 97.3°F | Ht 62.0 in | Wt 119.2 lb

## 2012-02-17 DIAGNOSIS — J479 Bronchiectasis, uncomplicated: Secondary | ICD-10-CM

## 2012-02-17 DIAGNOSIS — R0600 Dyspnea, unspecified: Secondary | ICD-10-CM

## 2012-02-17 DIAGNOSIS — R0609 Other forms of dyspnea: Secondary | ICD-10-CM

## 2012-02-17 DIAGNOSIS — R0989 Other specified symptoms and signs involving the circulatory and respiratory systems: Secondary | ICD-10-CM

## 2012-02-17 DIAGNOSIS — J9610 Chronic respiratory failure, unspecified whether with hypoxia or hypercapnia: Secondary | ICD-10-CM

## 2012-02-17 LAB — CBC WITH DIFFERENTIAL/PLATELET
Basophils Relative: 0.4 % (ref 0.0–3.0)
Eosinophils Relative: 0.5 % (ref 0.0–5.0)
HCT: 39.5 % (ref 36.0–46.0)
Hemoglobin: 12.6 g/dL (ref 12.0–15.0)
Lymphs Abs: 1.2 10*3/uL (ref 0.7–4.0)
MCV: 83.1 fl (ref 78.0–100.0)
Monocytes Absolute: 1 10*3/uL (ref 0.1–1.0)
Monocytes Relative: 6.1 % (ref 3.0–12.0)
Neutro Abs: 13.4 10*3/uL — ABNORMAL HIGH (ref 1.4–7.7)
WBC: 15.6 10*3/uL — ABNORMAL HIGH (ref 4.5–10.5)

## 2012-02-17 LAB — BASIC METABOLIC PANEL
BUN: 11 mg/dL (ref 6–23)
CO2: 29 mEq/L (ref 19–32)
Chloride: 101 mEq/L (ref 96–112)
Potassium: 4.4 mEq/L (ref 3.5–5.1)

## 2012-02-17 LAB — BRAIN NATRIURETIC PEPTIDE: Pro B Natriuretic peptide (BNP): 50 pg/mL (ref 0.0–100.0)

## 2012-02-17 MED ORDER — FORMOTEROL FUMARATE 20 MCG/2ML IN NEBU: 20.0000 ug | INHALATION_SOLUTION | Freq: Two times a day (BID) | RESPIRATORY_TRACT | Status: DC

## 2012-02-17 MED ORDER — AMOXICILLIN-POT CLAVULANATE 875-125 MG PO TABS: 1.0000 | ORAL_TABLET | Freq: Two times a day (BID) | ORAL | Status: DC

## 2012-02-17 NOTE — Assessment & Plan Note (Signed)
Patient Saturations on Room Air at Rest = 85%  12-19-11    - 2lpm at hs and with activity around the house, 3lpm pulsed on port concentrator when outside as of 02/17/2012     - 02/17/2012  Walked 3lpm pulsed  x 1 laps @ 185 ft each stopped due to sob and sat 89%  Relatively well compensated on present rx, says not interested in walking further or faster than what she did today so no need to adjust meds upward

## 2012-02-17 NOTE — Assessment & Plan Note (Addendum)
-   Xolair attempted 6/04 -> 9/05  - Immunotherapy stopped 8/09  - Started xyflo 8/09 > 9/09 no benefit  - VEST 2008 no benefit  - Flutter valve helpful March 17, 2009  - Allergy profile April 12, 2010 >> IgE 30 unremarkable  - Dulera 200 added November 22, 2010 > could not tolerate -Sputum cx 08/29/2011 +ESCHERICHIA COLI mixed sensitivities > flare tx w/ augmentin (d/t sulfa all)   Not clear whether she's really having an acute flare but did respond better to augmentin than prev rx for suspected pseudomonas with the unusual finding of e coli in sputum so repeat rx with augmentin and ok to titrate the prednisone up to 40 and down to 10 mg per day floor based on the principle The goal with a chronic steroid dependent illness is always arriving at the lowest effective dose that controls the disease/symptoms and not accepting a set "formula" which is based on statistics or guidelines that don't always take into account patient  variability or the natural hx of the dz in every individual patient, which may well vary over time.    Also since using so much daytime saba at baseline rec trial of performist and see if prn saba drops to more of a rescue mode as intended.    Each maintenance medication was reviewed in detail including most importantly the difference between maintenance and as needed and under what circumstances the prns are to be used. This was done in the context of a medication calendar review which provided the patient with a user-friendly unambiguous mechanism for medication administration and reconciliation and provides an action plan for all active problems. It is critical that this be shown to every doctor  for modification during the office visit if necessary so the patient can use it as a working document.
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
## 2012-02-17 NOTE — Patient Instructions (Signed)
Start performist twice daily and you should see a lot less need for the ventolin and improved activity tolerance  Change your as needed antibiotic to augmentin x 10 day cycles  See calendar for specific medication instructions and bring it back for each and every office visit for every healthcare provider you see.  Without it,  you may not receive the best quality medical care that we feel you deserve.  You will note that the calendar groups together  your maintenance  medications that are timed at particular times of the day.  Think of this as your checklist for what your doctor has instructed you to do until your next evaluation to see what benefit  there is  to staying on a consistent group of medications intended to keep you well.  The other group at the bottom is entirely up to you to use as you see fit  for specific symptoms that may arise between visits that require you to treat them on an as needed basis.  Think of this as your action plan or "what if" list.   Separating the top medications from the bottom group is fundamental to providing you adequate care going forward.    Please remember to go to the lab and x-ray department downstairs for your tests - we will call you with the results when they are available.  Keep previous appt for cpx.

## 2012-02-22 NOTE — Progress Notes (Signed)
Quick Note:  Spoke with pt and notified of results per Dr. [name]. Pt verbalized understanding and denied any questions.  ______ 

## 2012-02-28 ENCOUNTER — Other Ambulatory Visit: Payer: Self-pay | Admitting: Internal Medicine

## 2012-03-12 ENCOUNTER — Encounter: Payer: Self-pay | Admitting: Internal Medicine

## 2012-03-12 ENCOUNTER — Ambulatory Visit (INDEPENDENT_AMBULATORY_CARE_PROVIDER_SITE_OTHER): Payer: Medicare Other | Admitting: Internal Medicine

## 2012-03-12 ENCOUNTER — Other Ambulatory Visit (INDEPENDENT_AMBULATORY_CARE_PROVIDER_SITE_OTHER): Payer: Medicare Other

## 2012-03-12 VITALS — BP 136/74 | HR 95 | Temp 98.1°F | Ht 62.0 in | Wt 117.8 lb

## 2012-03-12 DIAGNOSIS — E785 Hyperlipidemia, unspecified: Secondary | ICD-10-CM

## 2012-03-12 DIAGNOSIS — I10 Essential (primary) hypertension: Secondary | ICD-10-CM

## 2012-03-12 DIAGNOSIS — J479 Bronchiectasis, uncomplicated: Secondary | ICD-10-CM

## 2012-03-12 DIAGNOSIS — E039 Hypothyroidism, unspecified: Secondary | ICD-10-CM

## 2012-03-12 DIAGNOSIS — M810 Age-related osteoporosis without current pathological fracture: Secondary | ICD-10-CM

## 2012-03-12 DIAGNOSIS — J9610 Chronic respiratory failure, unspecified whether with hypoxia or hypercapnia: Secondary | ICD-10-CM

## 2012-03-12 DIAGNOSIS — D72829 Elevated white blood cell count, unspecified: Secondary | ICD-10-CM

## 2012-03-12 LAB — CBC WITH DIFFERENTIAL/PLATELET
Basophils Absolute: 0.1 10*3/uL (ref 0.0–0.1)
Eosinophils Absolute: 0.1 10*3/uL (ref 0.0–0.7)
HCT: 39.3 % (ref 36.0–46.0)
Hemoglobin: 12.6 g/dL (ref 12.0–15.0)
Lymphs Abs: 2.4 10*3/uL (ref 0.7–4.0)
MCHC: 32.1 g/dL (ref 30.0–36.0)
MCV: 82.8 fl (ref 78.0–100.0)
Monocytes Absolute: 1.6 10*3/uL — ABNORMAL HIGH (ref 0.1–1.0)
Monocytes Relative: 13 % — ABNORMAL HIGH (ref 3.0–12.0)
Neutro Abs: 7.9 10*3/uL — ABNORMAL HIGH (ref 1.4–7.7)
RDW: 16.2 % — ABNORMAL HIGH (ref 11.5–14.6)

## 2012-03-12 LAB — HEPATIC FUNCTION PANEL
AST: 19 U/L (ref 0–37)
Albumin: 3.4 g/dL — ABNORMAL LOW (ref 3.5–5.2)

## 2012-03-12 LAB — URINALYSIS, ROUTINE W REFLEX MICROSCOPIC
Bilirubin Urine: NEGATIVE
Nitrite: NEGATIVE
Total Protein, Urine: NEGATIVE
pH: 6.5 (ref 5.0–8.0)

## 2012-03-12 LAB — LIPID PANEL
Cholesterol: 175 mg/dL (ref 0–200)
Triglycerides: 94 mg/dL (ref 0.0–149.0)
VLDL: 18.8 mg/dL (ref 0.0–40.0)

## 2012-03-12 NOTE — Assessment & Plan Note (Signed)
Patient Saturations on Room Air at Rest = 85%  12-19-11    - 2lpm at hs and with activity around the house, 3lpm pulsed on port concentrator when outside as of 02/17/2012     - 02/17/2012  Walked 3lpm pulsed  x 1 laps @ 185 ft each stopped due to sob and sat 89%  Adequate control on present rx, reviewed  

## 2012-03-12 NOTE — Assessment & Plan Note (Signed)
--  BMD 11/10 -2.2 > reclast rx started 10/24/08              --#1 infusion 10/24/08   --#2 infusion 11/16/09             --#3 infusion 12/09/10   ---#4 Infusion set for 01/10/12  Check vit D level
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
## 2012-03-12 NOTE — Assessment & Plan Note (Signed)
-   Xolair attempted 6/04 -> 9/05  - Immunotherapy stopped 8/09  - Started xyflo 8/09 > 9/09 no benefit  - VEST 2008 no benefit  - Flutter valve helpful March 17, 2009  - Allergy profile April 12, 2010 >> IgE 30 unremarkable  - Dulera 200 added November 22, 2010 > could not tolerate -Sputum cx 08/29/2011 +ESCHERICHIA COLI mixed sensitivities > flare tx w/ augmentin (d/t sulfa all)  - Add perfomist 02/17/2012 > no benefit  Continue prn augmentin for flares.    Each maintenance medication was reviewed in detail including most importantly the difference between maintenance and as needed and under what circumstances the prns are to be used. This was done in the context of a medication calendar review which provided the patient with a user-friendly unambiguous mechanism for medication administration and reconciliation and provides an action plan for all active problems. It is critical that this be shown to every doctor  for modification during the office visit if necessary so the patient can use it as a working document.

## 2012-03-12 NOTE — Patient Instructions (Signed)
See calendar for specific medication instructions and bring it back for each and every office visit for every healthcare provider you see.  Without it,  you may not receive the best quality medical care that we feel you deserve.  You will note that the calendar groups together  your maintenance  medications that are timed at particular times of the day.  Think of this as your checklist for what your doctor has instructed you to do until your next evaluation to see what benefit  there is  to staying on a consistent group of medications intended to keep you well.  The other group at the bottom is entirely up to you to use as you see fit  for specific symptoms that may arise between visits that require you to treat them on an as needed basis.  Think of this as your action plan or "what if" list.   Separating the top medications from the bottom group is fundamental to providing you adequate care going forward.   Please remember to go to the lab   department downstairs for your tests - we will call you with the results when they are available.  Please schedule a follow up visit in 3 months but call sooner if needed     

## 2012-03-12 NOTE — Assessment & Plan Note (Signed)
Adequate control on present rx, reviewed  

## 2012-03-12 NOTE — Assessment & Plan Note (Addendum)
-   Target LDL < 70 due to prev TIA's  Lab Results  Component Value Date   LDLCALC 77 03/12/2012    Adequate control on present rx, reviewed

## 2012-03-13 ENCOUNTER — Encounter: Payer: Self-pay | Admitting: Internal Medicine

## 2012-03-13 NOTE — Progress Notes (Signed)
Quick Note:  Spoke with pt and notified of results per Dr. [name]. Pt verbalized understanding and denied any questions.  ______ 

## 2012-03-27 ENCOUNTER — Other Ambulatory Visit: Payer: Self-pay | Admitting: Internal Medicine

## 2012-03-28 ENCOUNTER — Telehealth: Payer: Self-pay | Admitting: Internal Medicine

## 2012-03-28 MED ORDER — ALPRAZOLAM 0.25 MG PO TABS
0.2500 mg | ORAL_TABLET | Freq: Four times a day (QID) | ORAL | Status: DC | PRN
Start: 2012-03-28 — End: 2012-04-29

## 2012-03-28 NOTE — Telephone Encounter (Signed)
Bisoprolol was filled yesterday 7.2.13 electronically along with her synthroid.  Pt's xanax last filled 06/2011 with 3 refills; ok for refill.  Called [facility] [address] to inquire why pt's bisoprolol is not ready for her.  Per Marylene Land, pharmacist, the bisoprolol is too early to fill > last filled 6.11.13.  Gave verbal authorization to fill pt's xanax #30 with 5 refills.  Called spoke with patient and informed her of the above.  Pt stated that she was aware that the bisoprolol was unable to fill d/t early refill.  Nothing further needed per pt.  Will sign off.  Xanax refills documented in med list.

## 2012-04-29 ENCOUNTER — Emergency Department (HOSPITAL_COMMUNITY): Payer: Medicare Other

## 2012-04-29 ENCOUNTER — Inpatient Hospital Stay (HOSPITAL_COMMUNITY)
Admission: EM | Admit: 2012-04-29 | Discharge: 2012-05-02 | DRG: 563 | Disposition: A | Discharge status: Home Health | Payer: Medicare Other | Attending: Family Medicine | Admitting: Family Medicine

## 2012-04-29 ENCOUNTER — Encounter (HOSPITAL_COMMUNITY): Payer: Self-pay | Admitting: Emergency Medicine

## 2012-04-29 ENCOUNTER — Inpatient Hospital Stay (HOSPITAL_COMMUNITY): Payer: Medicare Other

## 2012-04-29 DIAGNOSIS — W010XXA Fall on same level from slipping, tripping and stumbling without subsequent striking against object, initial encounter: Secondary | ICD-10-CM

## 2012-04-29 DIAGNOSIS — S42293A Other displaced fracture of upper end of unspecified humerus, initial encounter for closed fracture: Secondary | ICD-10-CM | POA: Diagnosis present

## 2012-04-29 DIAGNOSIS — K219 Gastro-esophageal reflux disease without esophagitis: Secondary | ICD-10-CM

## 2012-04-29 DIAGNOSIS — S82009A Unspecified fracture of unspecified patella, initial encounter for closed fracture: Principal | ICD-10-CM

## 2012-04-29 DIAGNOSIS — J45909 Unspecified asthma, uncomplicated: Secondary | ICD-10-CM | POA: Diagnosis present

## 2012-04-29 DIAGNOSIS — Z8673 Personal history of transient ischemic attack (TIA), and cerebral infarction without residual deficits: Secondary | ICD-10-CM

## 2012-04-29 DIAGNOSIS — E039 Hypothyroidism, unspecified: Secondary | ICD-10-CM

## 2012-04-29 DIAGNOSIS — J471 Bronchiectasis with (acute) exacerbation: Secondary | ICD-10-CM

## 2012-04-29 DIAGNOSIS — J479 Bronchiectasis, uncomplicated: Secondary | ICD-10-CM

## 2012-04-29 DIAGNOSIS — S01511A Laceration without foreign body of lip, initial encounter: Secondary | ICD-10-CM

## 2012-04-29 DIAGNOSIS — I10 Essential (primary) hypertension: Secondary | ICD-10-CM

## 2012-04-29 DIAGNOSIS — Z79899 Other long term (current) drug therapy: Secondary | ICD-10-CM

## 2012-04-29 DIAGNOSIS — R141 Gas pain: Secondary | ICD-10-CM

## 2012-04-29 DIAGNOSIS — S42409A Unspecified fracture of lower end of unspecified humerus, initial encounter for closed fracture: Secondary | ICD-10-CM

## 2012-04-29 DIAGNOSIS — S022XXA Fracture of nasal bones, initial encounter for closed fracture: Secondary | ICD-10-CM

## 2012-04-29 DIAGNOSIS — S42213A Unspecified displaced fracture of surgical neck of unspecified humerus, initial encounter for closed fracture: Secondary | ICD-10-CM | POA: Diagnosis present

## 2012-04-29 DIAGNOSIS — F32A Depression, unspecified: Secondary | ICD-10-CM

## 2012-04-29 DIAGNOSIS — J9610 Chronic respiratory failure, unspecified whether with hypoxia or hypercapnia: Secondary | ICD-10-CM

## 2012-04-29 DIAGNOSIS — M8900 Algoneurodystrophy, unspecified site: Secondary | ICD-10-CM

## 2012-04-29 DIAGNOSIS — G459 Transient cerebral ischemic attack, unspecified: Secondary | ICD-10-CM

## 2012-04-29 DIAGNOSIS — S42301A Unspecified fracture of shaft of humerus, right arm, initial encounter for closed fracture: Secondary | ICD-10-CM

## 2012-04-29 DIAGNOSIS — F329 Major depressive disorder, single episode, unspecified: Secondary | ICD-10-CM

## 2012-04-29 DIAGNOSIS — R5381 Other malaise: Secondary | ICD-10-CM

## 2012-04-29 DIAGNOSIS — K5730 Diverticulosis of large intestine without perforation or abscess without bleeding: Secondary | ICD-10-CM

## 2012-04-29 DIAGNOSIS — Z9981 Dependence on supplemental oxygen: Secondary | ICD-10-CM

## 2012-04-29 DIAGNOSIS — M2550 Pain in unspecified joint: Secondary | ICD-10-CM

## 2012-04-29 DIAGNOSIS — S42401A Unspecified fracture of lower end of right humerus, initial encounter for closed fracture: Secondary | ICD-10-CM

## 2012-04-29 DIAGNOSIS — Y9229 Other specified public building as the place of occurrence of the external cause: Secondary | ICD-10-CM

## 2012-04-29 DIAGNOSIS — Z8719 Personal history of other diseases of the digestive system: Secondary | ICD-10-CM

## 2012-04-29 DIAGNOSIS — R5383 Other fatigue: Secondary | ICD-10-CM

## 2012-04-29 DIAGNOSIS — K589 Irritable bowel syndrome without diarrhea: Secondary | ICD-10-CM | POA: Diagnosis present

## 2012-04-29 DIAGNOSIS — R040 Epistaxis: Secondary | ICD-10-CM | POA: Diagnosis present

## 2012-04-29 DIAGNOSIS — E876 Hypokalemia: Secondary | ICD-10-CM

## 2012-04-29 DIAGNOSIS — E785 Hyperlipidemia, unspecified: Secondary | ICD-10-CM

## 2012-04-29 DIAGNOSIS — D72829 Elevated white blood cell count, unspecified: Secondary | ICD-10-CM | POA: Diagnosis present

## 2012-04-29 DIAGNOSIS — M810 Age-related osteoporosis without current pathological fracture: Secondary | ICD-10-CM

## 2012-04-29 DIAGNOSIS — R42 Dizziness and giddiness: Secondary | ICD-10-CM

## 2012-04-29 DIAGNOSIS — J310 Chronic rhinitis: Secondary | ICD-10-CM

## 2012-04-29 DIAGNOSIS — S01501A Unspecified open wound of lip, initial encounter: Secondary | ICD-10-CM | POA: Diagnosis present

## 2012-04-29 DIAGNOSIS — S82001A Unspecified fracture of right patella, initial encounter for closed fracture: Secondary | ICD-10-CM

## 2012-04-29 LAB — URINALYSIS, ROUTINE W REFLEX MICROSCOPIC
Glucose, UA: NEGATIVE mg/dL
Hgb urine dipstick: NEGATIVE
Specific Gravity, Urine: 1.017 (ref 1.005–1.030)

## 2012-04-29 LAB — COMPREHENSIVE METABOLIC PANEL
AST: 18 U/L (ref 0–37)
Albumin: 3 g/dL — ABNORMAL LOW (ref 3.5–5.2)
Calcium: 8.4 mg/dL (ref 8.4–10.5)
Creatinine, Ser: 0.71 mg/dL (ref 0.50–1.10)
Total Protein: 6.5 g/dL (ref 6.0–8.3)

## 2012-04-29 LAB — CBC WITH DIFFERENTIAL/PLATELET
Basophils Absolute: 0 10*3/uL (ref 0.0–0.1)
Basophils Relative: 0 % (ref 0–1)
Hemoglobin: 11.2 g/dL — ABNORMAL LOW (ref 12.0–15.0)
MCHC: 31.9 g/dL (ref 30.0–36.0)
Neutro Abs: 14.3 10*3/uL — ABNORMAL HIGH (ref 1.7–7.7)
Neutrophils Relative %: 88 % — ABNORMAL HIGH (ref 43–77)
RDW: 14.8 % (ref 11.5–15.5)

## 2012-04-29 LAB — URINE MICROSCOPIC-ADD ON

## 2012-04-29 MED ORDER — CALCIUM CARBONATE-VITAMIN D 500-200 MG-UNIT PO TABS
1.0000 | ORAL_TABLET | Freq: Two times a day (BID) | ORAL | Status: DC
  Administered 2012-04-29 – 2012-05-02 (×6): 1 via ORAL
  Filled 2012-04-29 (×7): qty 1

## 2012-04-29 MED ORDER — PREDNISONE 10 MG PO TABS
10.0000 mg | ORAL_TABLET | Freq: Every day | ORAL | Status: DC
  Administered 2012-04-30 – 2012-05-02 (×3): 10 mg via ORAL
  Filled 2012-04-29 (×4): qty 1

## 2012-04-29 MED ORDER — FLUTICASONE PROPIONATE HFA 44 MCG/ACT IN AERO
1.0000 | INHALATION_SPRAY | Freq: Two times a day (BID) | RESPIRATORY_TRACT | Status: DC
  Filled 2012-04-29: qty 10.6

## 2012-04-29 MED ORDER — CICLESONIDE 50 MCG/ACT NA SUSP: 2.0000 | Freq: Every day | NASAL | Status: DC

## 2012-04-29 MED ORDER — SODIUM CHLORIDE 0.9 % IV SOLN
INTRAVENOUS | Status: DC
  Administered 2012-04-29 – 2012-04-30 (×2): via INTRAVENOUS
  Administered 2012-04-30: 75 mL/h via INTRAVENOUS
  Administered 2012-05-01: 13:00:00 via INTRAVENOUS

## 2012-04-29 MED ORDER — LATANOPROST 0.005 % OP SOLN
1.0000 [drp] | Freq: Every day | OPHTHALMIC | Status: DC
  Administered 2012-04-29 – 2012-05-01 (×3): 1 [drp] via OPHTHALMIC
  Filled 2012-04-29: qty 2.5

## 2012-04-29 MED ORDER — LANSOPRAZOLE 30 MG PO TBDP: 30.0000 mg | ORAL_TABLET | Freq: Every day | ORAL | Status: DC

## 2012-04-29 MED ORDER — ENOXAPARIN SODIUM 40 MG/0.4ML ~~LOC~~ SOLN
40.0000 mg | SUBCUTANEOUS | Status: DC
  Administered 2012-04-29 – 2012-05-01 (×3): 40 mg via SUBCUTANEOUS
  Filled 2012-04-29 (×4): qty 0.4

## 2012-04-29 MED ORDER — ALBUTEROL SULFATE (5 MG/ML) 0.5% IN NEBU
2.5000 mg | INHALATION_SOLUTION | Freq: Four times a day (QID) | RESPIRATORY_TRACT | Status: DC
  Administered 2012-04-30 – 2012-05-02 (×10): 2.5 mg via RESPIRATORY_TRACT
  Filled 2012-04-29 (×10): qty 0.5

## 2012-04-29 MED ORDER — NISOLDIPINE ER 17 MG PO TB24
20.0000 mg | ORAL_TABLET | Freq: Every day | ORAL | Status: DC
  Administered 2012-04-30 – 2012-05-02 (×3): 17 mg via ORAL
  Filled 2012-04-29 (×3): qty 1

## 2012-04-29 MED ORDER — HYDROCODONE-ACETAMINOPHEN 5-325 MG PO TABS
1.0000 | ORAL_TABLET | Freq: Once | ORAL | Status: AC
  Administered 2012-04-29: 1 via ORAL
  Filled 2012-04-29: qty 1

## 2012-04-29 MED ORDER — BISOPROLOL FUMARATE 5 MG PO TABS
5.0000 mg | ORAL_TABLET | Freq: Every day | ORAL | Status: DC
  Administered 2012-04-30 – 2012-05-02 (×3): 5 mg via ORAL
  Filled 2012-04-29 (×3): qty 1

## 2012-04-29 MED ORDER — ONDANSETRON HCL 4 MG/2ML IJ SOLN: 4.0000 mg | Freq: Three times a day (TID) | INTRAMUSCULAR | Status: AC | PRN

## 2012-04-29 MED ORDER — ADULT MULTIVITAMIN W/MINERALS CH
1.0000 | ORAL_TABLET | Freq: Every day | ORAL | Status: DC
  Administered 2012-04-30 – 2012-05-02 (×3): 1 via ORAL
  Filled 2012-04-29 (×3): qty 1

## 2012-04-29 MED ORDER — MORPHINE SULFATE 2 MG/ML IJ SOLN: 1.0000 mg | INTRAMUSCULAR | Status: DC | PRN

## 2012-04-29 MED ORDER — MORPHINE SULFATE 4 MG/ML IJ SOLN: 4.0000 mg | INTRAMUSCULAR | Status: DC | PRN

## 2012-04-29 MED ORDER — PANTOPRAZOLE SODIUM 40 MG PO TBEC
40.0000 mg | DELAYED_RELEASE_TABLET | Freq: Every day | ORAL | Status: DC
  Administered 2012-04-29 – 2012-05-01 (×3): 40 mg via ORAL
  Filled 2012-04-29 (×4): qty 1

## 2012-04-29 MED ORDER — HYDROCODONE-ACETAMINOPHEN 5-325 MG PO TABS
1.0000 | ORAL_TABLET | ORAL | Status: DC | PRN
  Administered 2012-04-29: 1 via ORAL
  Filled 2012-04-29: qty 1

## 2012-04-29 MED ORDER — DOCUSATE SODIUM 100 MG PO CAPS
100.0000 mg | ORAL_CAPSULE | Freq: Two times a day (BID) | ORAL | Status: DC
  Administered 2012-04-29 – 2012-05-02 (×6): 100 mg via ORAL
  Filled 2012-04-29 (×7): qty 1

## 2012-04-29 MED ORDER — SERTRALINE HCL 25 MG PO TABS
25.0000 mg | ORAL_TABLET | Freq: Every day | ORAL | Status: DC
  Administered 2012-04-29 – 2012-05-01 (×3): 25 mg via ORAL
  Filled 2012-04-29 (×4): qty 1

## 2012-04-29 MED ORDER — PANTOPRAZOLE SODIUM 40 MG PO TBEC: 40.0000 mg | DELAYED_RELEASE_TABLET | Freq: Every day | ORAL | Status: DC

## 2012-04-29 MED ORDER — SODIUM CHLORIDE 0.9 % IV SOLN: INTRAVENOUS | Status: AC

## 2012-04-29 MED ORDER — ALPRAZOLAM 0.25 MG PO TABS: 0.2500 mg | ORAL_TABLET | Freq: Four times a day (QID) | ORAL | Status: DC | PRN

## 2012-04-29 NOTE — Consult Note (Signed)
[name] Hughs, MD Chief Complaint: S/p fall History: 78 year old female with a history of bronchiectasis on home oxygen follows with Dr. [name] , cheerful TIA, hypothyroidism, history of GERD, hiatal hernia who came to the hospital after falling on a hard surface and landed on her face her right shoulder and the right knee. She felt very dizzy after the fall and describes that she stumbled on a quarter at the church, and then landed on the floor. He denies any loss of consciousness. She started having heavy bleeding from her nose and her lower lip. In the ED she had imaging done which showed a nondisplaced fracture of the bridge of the nose, comminuted fracture off the right humerus with displacement and without humerus head dislocation and a nondisplaced fracture off of the right patella. Her nosebleed stopped in the ED and the lower lip laceration was sutured. Patient had 5/10 pain on arrival and after receiving some Vicodin she is currently pain free . Placed on an upper arm sling and a knee immobilizer. She attempted to get up from the wheelchair in the ED but was very dizzy. Orthostasis checked in the ED was negative.  She denies any chest pain palpitations, shortness of breath, has some headache, but no dizziness, denies any nausea or vomiting, denies any abdominal pain bowel or urinary symptoms.  Past Medical History  Diagnosis Date  . Unspecified hypothyroidism   . Bronchiectasis with acute exacerbation   . Other and unspecified hyperlipidemia   . TIA (transient ischemic attack)   . Osteoporosis, unspecified   . Asthma   . Esophageal reflux   . IBS (irritable bowel syndrome)   . Diverticulosis   . Hemorrhoids   . Hypothyroidism   . Schatzki's ring   . Hiatal hernia   . Tubular adenoma of colon 12/2008    Allergies  Allergen Reactions  . Chocolate Cough  . Codeine     Unknown reaction  . Erythromycin Ethylsuccinate     REACTION: unspecified  . Iodine   . Iohexol      Desc: pt  states allergy many years ago--unable to remember type of allergy--pre med in future slg 06/13/08   . Sulfamethoxazole     REACTION: unspecified  . Septra (Bactrim) Itching and Rash    No current facility-administered medications on file prior to encounter.   Current Outpatient Prescriptions on File Prior to Encounter  Medication Sig Dispense Refill  . albuterol (PROVENTIL) (2.5 MG/3ML) 0.083% nebulizer solution       . calcium-vitamin D (OSCAL WITH D) 500-200 MG-UNIT per tablet Take 1 tablet by mouth 2 (two) times daily.        . cetirizine (ZYRTEC) 10 MG tablet Take 10 mg by mouth at bedtime.        . famotidine (PEPCID) 20 MG tablet Take 20 mg by mouth as needed.       . latanoprost (XALATAN) 0.005 % ophthalmic solution Place 1 drop into both eyes at bedtime.       . Multiple Vitamin (MULTIVITAMIN) capsule Take 1 capsule by mouth daily.        . predniSONE (DELTASONE) 10 MG tablet Take 10 mg by mouth daily.       . sertraline (ZOLOFT) 25 MG tablet Take 25 mg by mouth as needed.      Marland Kitchen DISCONTD: ciclesonide (OMNARIS) 50 MCG/ACT nasal spray 0-2 sprays each nostril twice per day  12.5 g  3  . DISCONTD: potassium chloride SA (KLOR-CON M20) 20 MEQ tablet  Take 1 tablet (20 mEq total) by mouth 2 (two) times daily.      Marland Kitchen DISCONTD: PREVACID 24HR 15 MG capsule TAKE 1 CAPSULE TWICE DAILY  60 capsule  11    Physical Exam: Filed Vitals:   04/29/12 1650  BP: 169/77  Pulse: 66  Temp: 98.2 F (36.8 C)  Resp: 18  A+O X3 No sob/cp abd soft/nt NVI  R UE: NVI  Axillary/median/ulnar/radial/PIN function intact  2+ radial pulse  Pain with palpation  positive deformity - no laceration/abrasion noted R LE:  Compartments soft/NT  NVI  Pain over patella  No defect in patellar/quad tendon   Image: Dg Nasal Bones  04/29/2012  *RADIOLOGY REPORT*  Clinical Data: History of fall with injury to the face.  Pain in the nose and nasal bleeding.  NASAL BONES - 3+ VIEW  Comparison: No priors.  Findings:  Acute minimally displaced and minimally comminuted fracture of the nasal bones is noted. Overlying soft tissue swelling is present.  IMPRESSION: 1.  Acute minimally displaced minimally comminuted fractures of the nasal bones.  Original Report Authenticated By: Florencia Reasons, M.D.   Dg Shoulder Right  04/29/2012  *RADIOLOGY REPORT*  Clinical Data: History of fall complain of right-sided shoulder pain.  RIGHT SHOULDER - 2+ VIEW  Comparison: No priors.  Findings: There is an acute comminuted fracture of the right proximal humerus.  One portion of the fracture extends through the surgical neck, while another portion extends up to the greater tubercle.  The humeral head maintains a normal articulation with the glenoid.  There is approximate 1 cm of medial displacement of the humeral shaft.  Extensive reticulonodular changes noted in the visualized portions of the lungs, similar to prior examinations, likely representing chronic indolent atypical infectious process such as MAI (Mycobacterium avium-intracellulare).  IMPRESSION: 1.  Complex comminuted fracture of the right proximal humerus, as above.  The humeral head remains located, but there is medial displacement of the humeral shaft.  Original Report Authenticated By: Florencia Reasons, M.D.   Ct Head Wo Contrast  04/29/2012  *RADIOLOGY REPORT*  Clinical Data:  History of trauma from a fall.  Lacerations to the lip.  CT HEAD WITHOUT CONTRAST CT CERVICAL SPINE WITHOUT CONTRAST  Technique:  Multidetector CT imaging of the head and cervical spine was performed following the standard protocol without intravenous contrast.  Multiplanar CT image reconstructions of the cervical spine were also generated.  Comparison:  Head CT 01/12/2011.  CT HEAD  Findings: No acute displaced skull fractures are identified.  No acute intracranial abnormalities.  Specifically, no signs of acute post-traumatic intracranial hemorrhage, no definite area of acute/subacute cerebral  ischemia, no focal mass, mass effect, hydrocephalus or abnormal intra or extra-axial fluid collections. There is cerebral and cerebellar atrophy which is age appropriate. In addition, there are extensive patchy and confluent areas of decreased attenuation throughout the deep and periventricular white matter of the cerebral hemispheres bilaterally, most compatible with chronic microvascular ischemic disease.  Visualized paranasal sinuses and mastoids are remarkable for a postoperative changes of bilateral maxillary antrectomy, air fluid levels in the sphenoid sinuses, mucosal thickening throughout the ethmoid and maxillary sinuses bilaterally, and a small left mastoid effusion.  IMPRESSION: 1.  No acute displaced skull fractures or findings to suggest significant acute traumatic injury to the brain. 2.  Cerebral atrophy with extensive chronic microvascular ischemic changes in the white matter, as above, similar to prior study from 01/12/2011. 3.  Extensive paranasal sinus disease, as above, including air  fluid levels in the sphenoid sinuses which could suggest acute sinusitis.  Clinical correlation is recommended. 4.  Small left mastoid effusion.  CT CERVICAL SPINE  Findings: No acute displaced cervical spine fractures are identified.  There is severe multilevel degenerative disc disease, most pronounced at C3-C4, C4-C5 and C5-C6.  Multilevel facet arthropathy is also noted.  This results in mild straightening of normal cervical lordosis. Additionally, there is 2 mm of anterolisthesis of C6 upon C7, which is favored to be chronic. Alignment is otherwise anatomic. Prevertebral soft tissues are normal.  Visualized portions of the upper thorax are remarkable for extensive peribronchovascular micronodularity in the lung apices, likely to reflect areas of mucoid impaction within terminal bronchioles.  IMPRESSION: 1.  No evidence of significant acute traumatic injury to the cervical spine. 2.  Severe multilevel  degenerative disc disease and cervical spondylosis, as above. 3.  Extensive peribronchovascular micronodularity in the lung apices.  The appearance is suggestive of mucoid impaction within terminal bronchioles.  This is favored to represent an atypical infectious process such as Mycobacterium avium-intracellulare (MAI).  Original Report Authenticated By: Florencia Reasons, M.D.   Ct Cervical Spine Wo Contrast  04/29/2012  *RADIOLOGY REPORT*  Clinical Data:  History of trauma from a fall.  Lacerations to the lip.  CT HEAD WITHOUT CONTRAST CT CERVICAL SPINE WITHOUT CONTRAST  Technique:  Multidetector CT imaging of the head and cervical spine was performed following the standard protocol without intravenous contrast.  Multiplanar CT image reconstructions of the cervical spine were also generated.  Comparison:  Head CT 01/12/2011.  CT HEAD  Findings: No acute displaced skull fractures are identified.  No acute intracranial abnormalities.  Specifically, no signs of acute post-traumatic intracranial hemorrhage, no definite area of acute/subacute cerebral ischemia, no focal mass, mass effect, hydrocephalus or abnormal intra or extra-axial fluid collections. There is cerebral and cerebellar atrophy which is age appropriate. In addition, there are extensive patchy and confluent areas of decreased attenuation throughout the deep and periventricular white matter of the cerebral hemispheres bilaterally, most compatible with chronic microvascular ischemic disease.  Visualized paranasal sinuses and mastoids are remarkable for a postoperative changes of bilateral maxillary antrectomy, air fluid levels in the sphenoid sinuses, mucosal thickening throughout the ethmoid and maxillary sinuses bilaterally, and a small left mastoid effusion.  IMPRESSION: 1.  No acute displaced skull fractures or findings to suggest significant acute traumatic injury to the brain. 2.  Cerebral atrophy with extensive chronic microvascular ischemic  changes in the white matter, as above, similar to prior study from 01/12/2011. 3.  Extensive paranasal sinus disease, as above, including air fluid levels in the sphenoid sinuses which could suggest acute sinusitis.  Clinical correlation is recommended. 4.  Small left mastoid effusion.  CT CERVICAL SPINE  Findings: No acute displaced cervical spine fractures are identified.  There is severe multilevel degenerative disc disease, most pronounced at C3-C4, C4-C5 and C5-C6.  Multilevel facet arthropathy is also noted.  This results in mild straightening of normal cervical lordosis. Additionally, there is 2 mm of anterolisthesis of C6 upon C7, which is favored to be chronic. Alignment is otherwise anatomic. Prevertebral soft tissues are normal.  Visualized portions of the upper thorax are remarkable for extensive peribronchovascular micronodularity in the lung apices, likely to reflect areas of mucoid impaction within terminal bronchioles.  IMPRESSION: 1.  No evidence of significant acute traumatic injury to the cervical spine. 2.  Severe multilevel degenerative disc disease and cervical spondylosis, as above. 3.  Extensive peribronchovascular micronodularity  in the lung apices.  The appearance is suggestive of mucoid impaction within terminal bronchioles.  This is favored to represent an atypical infectious process such as Mycobacterium avium-intracellulare (MAI).  Original Report Authenticated By: Florencia Reasons, M.D.   Dg Knee Complete 4 Views Right  04/29/2012  *RADIOLOGY REPORT*  Clinical Data: Status post fall.  Knee pain.  RIGHT KNEE - COMPLETE 4+ VIEW  Comparison: None  Findings:  Lipohemarthrosis is identified within the suprapatellar joint space.  There is a fracture through the mid pole of the patella.  The fracture fragments are in near anatomic alignment.  No significant arthropathy.  No radiopaque foreign bodies or soft tissue calcifications.  IMPRESSION:  1.  Patellar fracture which appears  nondisplaced. 2.  Lipohemarthrosis.  Original Report Authenticated By: Rosealee Albee, M.D.    A/P: Patient s/p fall with multiple orthopedic injuries.  Patient tripped over oxygen cord and fell early today at church. Xrays demonstrate:  1. Non-displaced patella fx    2. Proximal right humerus fracture Plan: patient stable at present.  No need for acute surgical management Will order CT scan of right shoulder to better evaluate fx pattern Will discuss case with Dr Ranell Patrick to determine definitive fracture management plan Knee immobilizer for patellar fracture.

## 2012-04-29 NOTE — ED Notes (Signed)
MVH:[id number]<GE> Expected date:04/29/12<BR> Expected time:10:10 AM<BR> Means of arrival:Ambulance<BR> Comments:<BR> Fall, lip and shoulder injury, orthostatic, LSB

## 2012-04-29 NOTE — ED Notes (Signed)
Per EMS-Pt was at church, states she tripped over her O2 tank, fell and hit face. Lacerations of lip and left arm. R Shoulder immobilized increased pain with palpation. Normally wear 2L O2 at home. No LOC, n/v, or dizziness.

## 2012-04-29 NOTE — ED Notes (Signed)
Unable to get vitals standing, pt started feeling dizzy and weak.

## 2012-04-29 NOTE — H&P (Signed)
[facility] History and Physical  [name] ZOX:[id number] DOB: 1932/10/27 DOA: 04/29/2012  Referring physician: Dr Lynelle Doctor PCP: [name], MD   Chief Complaint: fall with fracture of rt humerus, rt patella and bridge of her nose  HPI:  78 year old female with a history of bronchiectasis on home oxygen follows with Dr. [name] , cheerful TIA, hypothyroidism, history of GERD, hiatal hernia who came to the hospital after falling on a hard surface and landed on her face her right shoulder and the right knee. She felt very dizzy after the fall and describes that she stumbled on a [place] at the [employer],  and then landed on the floor. He denies any loss of consciousness. She started having heavy bleeding from her nose and her lower lip. In the ED she had imaging done which showed a nondisplaced fracture of the bridge of the nose, comminuted fracture off the right humerus with displacement and without humerus head dislocation and a nondisplaced fracture off of the right patella. Her nosebleed stopped in the ED and the lower lip laceration was sutured. Patient had 5/10 pain on arrival and after receiving some Vicodin she is currently pain free . Placed on an upper arm sling and a knee immobilizer. She  attempted to get up from the wheelchair in the ED but was very dizzy. Orthostasis checked in the ED was negative. She denies any chest pain palpitations, shortness of breath, has some headache, but no dizziness, denies any nausea or vomiting, denies any abdominal pain bowel or urinary symptoms.  Review of Systems:  Constitutional: Denies fever, chills, diaphoresis, appetite change and fatigue.  HEENT: Epistaxis , Denies photophobia, eye pain, redness, hearing loss, ear pain, congestion, sore throat, rhinorrhea, sneezing, mouth sores, trouble swallowing, neck pain, neck stiffness and tinnitus.   Respiratory: Denies SOB, DOE, cough, chest tightness,  and wheezing.   Cardiovascular: Denies chest pain,  palpitations and leg swelling.  Gastrointestinal: Denies nausea, vomiting, abdominal pain, diarrhea, constipation, blood in stool and abdominal distention.  Genitourinary: Denies dysuria, urgency, frequency, hematuria, flank pain and difficulty urinating.  Musculoskeletal: Pain over right arm and right knee, Denies myalgias, back pain, joint swelling, arthralgias and gait problem.  Skin: Denies pallor, rash and wound.  Neurological: Felt lightheaded in the ED , complains of  mild headache,  Denies dizziness, seizures, syncope,  Hematological: Denies adenopathy. Easy bruising, personal or family bleeding history  Psychiatric/Behavioral: Denies suicidal ideation, mood changes, confusion, nervousness, sleep disturbance and agitation   Past Medical History  Diagnosis Date  . Unspecified hypothyroidism   . Bronchiectasis with acute exacerbation   . Other and unspecified hyperlipidemia   . TIA (transient ischemic attack)   . Osteoporosis, unspecified   . Asthma   . Esophageal reflux   . IBS (irritable bowel syndrome)   . Diverticulosis   . Hemorrhoids   . Hypothyroidism   . Schatzki's ring   . Hiatal hernia   . Tubular adenoma of colon 12/2008   Past Surgical History  Procedure Date  . Cholecystectomy 1995  . Nasal sinus surgery   . Tonsillectomy   . Foot surgery   . Small intestine surgery 05/2008   Social History:  reports that she has never smoked. She has never used smokeless tobacco. She reports that she does not drink alcohol or use illicit drugs.  Allergies  Allergen Reactions  . Chocolate Cough  . Codeine     Unknown reaction  . Erythromycin Ethylsuccinate     REACTION: unspecified  . Iodine   .  Iohexol      Desc: pt states allergy many years ago--unable to remember type of allergy--pre med in future slg 06/13/08   . Sulfamethoxazole     REACTION: unspecified  . Septra (Bactrim) Itching and Rash    Family History  Problem Relation Age of Onset  . Hypertension  Sister   . Heart disease Mother   . Heart disease Father   . Stroke Father   . Multiple myeloma Maternal Aunt   . Colon cancer Mother     Prior to Admission medications   Medication Sig Start Date End Date Taking? Authorizing Provider  albuterol (PROVENTIL) (2.5 MG/3ML) 0.083% nebulizer solution  01/03/12  Yes Nyoka Cowden, MD  ALPRAZolam Prudy Feeler) 0.25 MG tablet Take 0.25 mg by mouth every 6 (six) hours as needed. 03/28/12 03/28/13 Yes Nyoka Cowden, MD  beclomethasone (QVAR) 40 MCG/ACT inhaler Inhale 2 puffs into the lungs 2 (two) times daily.   Yes Historical Provider, MD  bisoprolol (ZEBETA) 5 MG tablet Take 5 mg by mouth daily.   Yes Historical Provider, MD  calcium-vitamin D (OSCAL WITH D) 500-200 MG-UNIT per tablet Take 1 tablet by mouth 2 (two) times daily.     Yes Historical Provider, MD  cetirizine (ZYRTEC) 10 MG tablet Take 10 mg by mouth at bedtime.     Yes Historical Provider, MD  ciclesonide (OMNARIS) 50 MCG/ACT nasal spray Place 2 sprays into both nostrils daily. 0-2 sprays each nostril twice per day 08/30/11  Yes Tammy S Parrett, NP  clopidogrel (PLAVIX) 75 MG tablet Take 75 mg by mouth daily.   Yes Historical Provider, MD  famotidine (PEPCID) 20 MG tablet Take 20 mg by mouth as needed.    Yes Historical Provider, MD  lansoprazole (PREVACID SOLUTAB) 30 MG disintegrating tablet Take 30 mg by mouth daily.   Yes Historical Provider, MD  latanoprost (XALATAN) 0.005 % ophthalmic solution Place 1 drop into both eyes at bedtime.  12/14/10  Yes Historical Provider, MD  levothyroxine (SYNTHROID, LEVOTHROID) 100 MCG tablet Take 100 mcg by mouth daily.   Yes Historical Provider, MD  methylcellulose oral powder Take 1 packet by mouth daily. 1 tsp by mouth once daily 10/10/11  Yes Tammy S Parrett, NP  Multiple Vitamin (MULTIVITAMIN) capsule Take 1 capsule by mouth daily.     Yes Historical Provider, MD  nisoldipine (SULAR) 20 MG 24 hr tablet Take 20 mg by mouth daily.   Yes Historical Provider, MD   potassium chloride SA (K-DUR,KLOR-CON) 20 MEQ tablet Take 20 mEq by mouth 2 (two) times daily. 10/10/11  Yes Tammy S Parrett, NP  predniSONE (DELTASONE) 10 MG tablet Take 10 mg by mouth daily.  08/09/11  Yes Nyoka Cowden, MD  sertraline (ZOLOFT) 25 MG tablet Take 25 mg by mouth as needed. 10/06/11 10/05/12 Yes Julio Sicks, NP    Physical Exam:  Filed Vitals:   04/29/12 1210 04/29/12 1257 04/29/12 1258 04/29/12 1548  BP: 120/53 136/71 123/53 141/73  Pulse:      Temp: 97.5 F (36.4 C)   98.3 F (36.8 C)  TempSrc: Oral     Resp: 18   20  SpO2: 98% 98% 94% 98%    Constitutional: Vital signs reviewed.  Patient is a well-developed and well-nourished in no acute distress and cooperative with exam.  HEENT: Dry blood in the nasal cavity, swollen lower lip with suturing of the laceration,  Normocephalic and atraumatic, no pallor no icterus,  moist oral mucosa, Neck: Supple, Trachea  midline normal ROM, No JVD, mass, thyromegaly, or carotid bruit present.  Cardiovascular: RRR, S1 normal, S2 normal, no MRG, pulses symmetric and intact bilaterally Pulmonary/Chest: Bilateral coarse crackles no wheezes, rales, or rhonchi Abdominal: Soft. Non-tender, non-distended, bowel sounds are normal, no masses, organomegaly, or guarding present.  GU: no CVA tenderness Musculoskeletal: No joint deformities, erythema, or stiffness, ROM full and no nontender Ext: Sling over her right humerus, knee right knee immobilizer applied no edema and no cyanosis, pulses palpable bilaterally (DP and PT) Hematology: no cervical, inginal, or axillary adenopathy.  Neurological: A&O x3, Strenght is normal and symmetric bilaterally, cranial nerve II-XII are grossly intact, no focal motor deficit, sensory intact to light touch bilaterally.  Skin: Warm, dry and intact. No rash, cyanosis, or clubbing.  Psychiatric: Normal mood and affect. speech and behavior is normal. Judgment and thought content normal. Cognition and memory are  normal.   Labs on Admission:  Basic Metabolic Panel:  Lab 04/29/12 9562  NA 134*  K 3.8  CL 99  CO2 25  GLUCOSE 128*  BUN 10  CREATININE 0.71  CALCIUM 8.4  MG --  PHOS --   Liver Function Tests:  Lab 04/29/12 1337  AST 18  ALT 11  ALKPHOS 50  BILITOT 0.3  PROT 6.5  ALBUMIN 3.0*   No results found for this basename: LIPASE:5,AMYLASE:5 in the last 168 hours No results found for this basename: AMMONIA:5 in the last 168 hours CBC:  Lab 04/29/12 1337  WBC 16.2*  NEUTROABS 14.3*  HGB 11.2*  HCT 35.1*  MCV 83.0  PLT 316   Cardiac Enzymes: No results found for this basename: CKTOTAL:5,CKMB:5,CKMBINDEX:5,TROPONINI:5 in the last 168 hours BNP: No components found with this basename: POCBNP:5 CBG: No results found for this basename: GLUCAP:5 in the last 168 hours  Radiological Exams on Admission: Dg Nasal Bones  04/29/2012  *RADIOLOGY REPORT*  Clinical Data: History of fall with injury to the face.  Pain in the nose and nasal bleeding.  NASAL BONES - 3+ VIEW  Comparison: No priors.  Findings: Acute minimally displaced and minimally comminuted fracture of the nasal bones is noted. Overlying soft tissue swelling is present.  IMPRESSION: 1.  Acute minimally displaced minimally comminuted fractures of the nasal bones.  Original Report Authenticated By: Florencia Reasons, M.D.   Dg Shoulder Right  04/29/2012  *RADIOLOGY REPORT*  Clinical Data: History of fall complain of right-sided shoulder pain.  RIGHT SHOULDER - 2+ VIEW  Comparison: No priors.  Findings: There is an acute comminuted fracture of the right proximal humerus.  One portion of the fracture extends through the surgical neck, while another portion extends up to the greater tubercle.  The humeral head maintains a normal articulation with the glenoid.  There is approximate 1 cm of medial displacement of the humeral shaft.  Extensive reticulonodular changes noted in the visualized portions of the lungs, similar to prior  examinations, likely representing chronic indolent atypical infectious process such as MAI (Mycobacterium avium-intracellulare).  IMPRESSION: 1.  Complex comminuted fracture of the right proximal humerus, as above.  The humeral head remains located, but there is medial displacement of the humeral shaft.  Original Report Authenticated By: Florencia Reasons, M.D.   Ct Head Wo Contrast  04/29/2012  *RADIOLOGY REPORT*  Clinical Data:  History of trauma from a fall.  Lacerations to the lip.  CT HEAD WITHOUT CONTRAST CT CERVICAL SPINE WITHOUT CONTRAST  Technique:  Multidetector CT imaging of the head and cervical spine was performed  following the standard protocol without intravenous contrast.  Multiplanar CT image reconstructions of the cervical spine were also generated.  Comparison:  Head CT 01/12/2011.  CT HEAD  Findings: No acute displaced skull fractures are identified.  No acute intracranial abnormalities.  Specifically, no signs of acute post-traumatic intracranial hemorrhage, no definite area of acute/subacute cerebral ischemia, no focal mass, mass effect, hydrocephalus or abnormal intra or extra-axial fluid collections. There is cerebral and cerebellar atrophy which is age appropriate. In addition, there are extensive patchy and confluent areas of decreased attenuation throughout the deep and periventricular white matter of the cerebral hemispheres bilaterally, most compatible with chronic microvascular ischemic disease.  Visualized paranasal sinuses and mastoids are remarkable for a postoperative changes of bilateral maxillary antrectomy, air fluid levels in the sphenoid sinuses, mucosal thickening throughout the ethmoid and maxillary sinuses bilaterally, and a small left mastoid effusion.  IMPRESSION: 1.  No acute displaced skull fractures or findings to suggest significant acute traumatic injury to the brain. 2.  Cerebral atrophy with extensive chronic microvascular ischemic changes in the white matter, as  above, similar to prior study from 01/12/2011. 3.  Extensive paranasal sinus disease, as above, including air fluid levels in the sphenoid sinuses which could suggest acute sinusitis.  Clinical correlation is recommended. 4.  Small left mastoid effusion.  CT CERVICAL SPINE  Findings: No acute displaced cervical spine fractures are identified.  There is severe multilevel degenerative disc disease, most pronounced at C3-C4, C4-C5 and C5-C6.  Multilevel facet arthropathy is also noted.  This results in mild straightening of normal cervical lordosis. Additionally, there is 2 mm of anterolisthesis of C6 upon C7, which is favored to be chronic. Alignment is otherwise anatomic. Prevertebral soft tissues are normal.  Visualized portions of the upper thorax are remarkable for extensive peribronchovascular micronodularity in the lung apices, likely to reflect areas of mucoid impaction within terminal bronchioles.  IMPRESSION: 1.  No evidence of significant acute traumatic injury to the cervical spine. 2.  Severe multilevel degenerative disc disease and cervical spondylosis, as above. 3.  Extensive peribronchovascular micronodularity in the lung apices.  The appearance is suggestive of mucoid impaction within terminal bronchioles.  This is favored to represent an atypical infectious process such as Mycobacterium avium-intracellulare (MAI).  Original Report Authenticated By: Florencia Reasons, M.D.   Ct Cervical Spine Wo Contrast  04/29/2012  *RADIOLOGY REPORT*  Clinical Data:  History of trauma from a fall.  Lacerations to the lip.  CT HEAD WITHOUT CONTRAST CT CERVICAL SPINE WITHOUT CONTRAST  Technique:  Multidetector CT imaging of the head and cervical spine was performed following the standard protocol without intravenous contrast.  Multiplanar CT image reconstructions of the cervical spine were also generated.  Comparison:  Head CT 01/12/2011.  CT HEAD  Findings: No acute displaced skull fractures are identified.  No  acute intracranial abnormalities.  Specifically, no signs of acute post-traumatic intracranial hemorrhage, no definite area of acute/subacute cerebral ischemia, no focal mass, mass effect, hydrocephalus or abnormal intra or extra-axial fluid collections. There is cerebral and cerebellar atrophy which is age appropriate. In addition, there are extensive patchy and confluent areas of decreased attenuation throughout the deep and periventricular white matter of the cerebral hemispheres bilaterally, most compatible with chronic microvascular ischemic disease.  Visualized paranasal sinuses and mastoids are remarkable for a postoperative changes of bilateral maxillary antrectomy, air fluid levels in the sphenoid sinuses, mucosal thickening throughout the ethmoid and maxillary sinuses bilaterally, and a small left mastoid effusion.  IMPRESSION: 1.  No acute displaced skull fractures or findings to suggest significant acute traumatic injury to the brain. 2.  Cerebral atrophy with extensive chronic microvascular ischemic changes in the white matter, as above, similar to prior study from 01/12/2011. 3.  Extensive paranasal sinus disease, as above, including air fluid levels in the sphenoid sinuses which could suggest acute sinusitis.  Clinical correlation is recommended. 4.  Small left mastoid effusion.  CT CERVICAL SPINE  Findings: No acute displaced cervical spine fractures are identified.  There is severe multilevel degenerative disc disease, most pronounced at C3-C4, C4-C5 and C5-C6.  Multilevel facet arthropathy is also noted.  This results in mild straightening of normal cervical lordosis. Additionally, there is 2 mm of anterolisthesis of C6 upon C7, which is favored to be chronic. Alignment is otherwise anatomic. Prevertebral soft tissues are normal.  Visualized portions of the upper thorax are remarkable for extensive peribronchovascular micronodularity in the lung apices, likely to reflect areas of mucoid impaction  within terminal bronchioles.  IMPRESSION: 1.  No evidence of significant acute traumatic injury to the cervical spine. 2.  Severe multilevel degenerative disc disease and cervical spondylosis, as above. 3.  Extensive peribronchovascular micronodularity in the lung apices.  The appearance is suggestive of mucoid impaction within terminal bronchioles.  This is favored to represent an atypical infectious process such as Mycobacterium avium-intracellulare (MAI).  Original Report Authenticated By: Florencia Reasons, M.D.   Dg Knee Complete 4 Views Right  04/29/2012  *RADIOLOGY REPORT*  Clinical Data: Status post fall.  Knee pain.  RIGHT KNEE - COMPLETE 4+ VIEW  Comparison: None  Findings:  Lipohemarthrosis is identified within the suprapatellar joint space.  There is a fracture through the mid pole of the patella.  The fracture fragments are in near anatomic alignment.  No significant arthropathy.  No radiopaque foreign bodies or soft tissue calcifications.  IMPRESSION:  1.  Patellar fracture which appears nondisplaced. 2.  Lipohemarthrosis.  Original Report Authenticated By: Rosealee Albee, M.D.      Assessment/Plan Principal Problem:   *Fall due to stumbling -Patient tripped on a cord and sustained nondisplaced fracture of the bridge of the nose, displaced comminuted right humeral fracture, and nondisplaced fracture of the right patella. -She has a upper arm sling and right knee immobilizer in place. -Patient will be admitted to MedSurg -Pain control with when necessary Vicodin and low dose when necessary morphine for  severe pain  -I have spoken with orthopedics consult Dr. Dion Saucier who will evaluate the patient and decide on surgery.    Nasal bones, closed fracture No bleeding currently. Head and CT cervical spine within normal limits ENT surgeon Dr. Lazarus Salines was called from the ED who advised ice and elevation for nasal fracture. He he Wants to see pt in the office next week. Asked for pt to call  office and ask for Amy, his nurse to get pt an appointment.    HYPOTHYROIDISM Continue with Synthroid   HYPERTENSION, BENIGN Purposes stable continue home meds  Leukocytosis The due to acute stress and chronic prednisone  Chronic Bronchiectasis  On chronic home O2 and prednisone which will be continued, continue with nebs   GERD Continue PPI  History of TIA on Plavix at home which will be held  DVT prophylaxis Subcutaneous Lovenox  Diet Cardiac  Code Status: Full code  Disposition Plan : inpatient  Eddie North [facility] Pager [phone]  If 7PM-7AM, please contact night-coverage [email or web address] Password [email or web address] 04/29/2012, 4:53 PM

## 2012-04-29 NOTE — ED Notes (Signed)
Pt. Requested assistance to restroom. Pt. Advised of use of bedpan; pt. Declined, requested to go to bathroom. Wheelchair brought to pt. Room, assisted pt. Into wheelchair w/out complications. Pt. Wheeled to restroom, assisted w/pivot from chair to toilet. Pt. Sat bent over on toilet, c/o lightheaded/dizzy. Advised pt. To elevate head slightly, breath normal, take time on toilet. Pt. Sat on toilet approx. 2-3 minutes, states feeling better, ready to get up and back to room. Assisted pt. Standing up off toilet, pulled pt. Underwear/pants back up; pt. Started to turn and sit in chair, pt. Slowly began to drop. Pt. Grabbed from back to front waist and eased to floor. Bathroom alarm rang to ED secretary desk for assistance. Pt. Did not loc; did to hit/injure any body area. Pt. Assisted into wheelchair, wheeled to assigned room, assisted back into bed. Pt. Vitals measured and recorded. Pt. has no c/o pain, injury; states feeling funny/lightheaded; pt. Made comfortable in bed. Assigned RN/PA advised of activities. ENM

## 2012-04-29 NOTE — ED Provider Notes (Addendum)
Pt with nasal injury  And shoulder injury after tripping over her oxygen tubing.  No loc but takes plavix.  Consistent with mechanical fall.  No syncope.  Will check films and treat accordingly.  Celene Kras, MD 04/29/12 1106  Pt also had patellar fracture.  Unable to ambulate and manage at home.  Will admit for pain management, PT.    Medical screening examination/treatment/procedure(s) were conducted as a shared visit with non-physician practitioner(s) and myself.  I personally evaluated the patient during the encounter   Celene Kras, MD 04/30/12 [phone]

## 2012-04-29 NOTE — ED Notes (Signed)
Report given to Kim RN.

## 2012-04-29 NOTE — ED Provider Notes (Signed)
History     CSN: [id number]  Arrival date & time 04/29/12  1013   First MD Initiated Contact with Patient 04/29/12 1031      Chief Complaint  Patient presents with  . Fall    (Consider location/radiation/quality/duration/timing/severity/associated sxs/prior treatment) Patient is a 78 y.o. female presenting with fall. The history is provided by the patient.  Fall The accident occurred less than 1 hour ago. The fall occurred while walking. She landed on a hard floor. The point of impact was the head, right shoulder and right knee. The pain is present in the right shoulder, right knee and head. The pain is at a severity of 5/10. The pain is mild. She was not ambulatory at the scene. Associated symptoms include headaches. Pertinent negatives include no fever, no abdominal pain, no nausea and no vomiting.  Pt states she tripped over the cord at church, fell over the table and then on the floor, hitting head on the ground. No LOC, hit her face, nose, right shoulder, right knee. Pt had nose bleed which has now resolved, also lac to the right lower lip.   Past Medical History  Diagnosis Date  . Unspecified hypothyroidism   . Bronchiectasis with acute exacerbation   . Other and unspecified hyperlipidemia   . TIA (transient ischemic attack)   . Osteoporosis, unspecified   . Asthma   . Esophageal reflux   . IBS (irritable bowel syndrome)   . Diverticulosis   . Hemorrhoids   . Hypothyroidism   . Schatzki's ring   . Hiatal hernia   . Tubular adenoma of colon 12/2008    Past Surgical History  Procedure Date  . Cholecystectomy 1995  . Nasal sinus surgery   . Tonsillectomy   . Foot surgery   . Small intestine surgery 05/2008    Family History  Problem Relation Age of Onset  . Hypertension Sister   . Heart disease Mother   . Heart disease Father   . Stroke Father   . Multiple myeloma Maternal Aunt   . Colon cancer Mother     History  Substance Use Topics  . Smoking status:  Never Smoker   . Smokeless tobacco: Never Used  . Alcohol Use: No    OB History    Grav Para Term Preterm Abortions TAB SAB Ect Mult Living                  Review of Systems  Constitutional: Negative for fever, chills and fatigue.  HENT: Positive for nosebleeds. Negative for neck pain and neck stiffness.   Eyes: Negative for visual disturbance.  Respiratory: Negative.   Cardiovascular: Negative.   Gastrointestinal: Negative for nausea, vomiting and abdominal pain.  Musculoskeletal: Positive for joint swelling and arthralgias.  Skin: Positive for wound.  Neurological: Positive for headaches. Negative for dizziness, syncope, speech difficulty and weakness.  Psychiatric/Behavioral: Negative.     Allergies  Chocolate; Codeine; Erythromycin ethylsuccinate; Iodine; Iohexol; Sulfamethoxazole; and Septra  Home Medications   Current Outpatient Rx  Name Route Sig Dispense Refill  . ALBUTEROL SULFATE (2.5 MG/3ML) 0.083% IN NEBU      . ALPRAZOLAM 0.25 MG PO TABS Oral Take 1 tablet (0.25 mg total) by mouth every 6 (six) hours as needed for anxiety. 30 tablet 5  . CALCIUM CARBONATE-VITAMIN D 500-200 MG-UNIT PO TABS Oral Take 1 tablet by mouth 2 (two) times daily.      Marland Kitchen CETIRIZINE HCL 10 MG PO TABS Oral Take 10 mg by mouth  at bedtime.      Marland Kitchen CICLESONIDE 50 MCG/ACT NA SUSP  0-2 sprays each nostril twice per day 12.5 g 3  . FAMOTIDINE 20 MG PO TABS Oral Take 20 mg by mouth as needed.     Marland Kitchen LATANOPROST 0.005 % OP SOLN Both Eyes Place 1 drop into both eyes at bedtime.     . METHYLCELLULOSE (LAXATIVE) PO POWD  1 tsp by mouth once daily    . MULTIVITAMINS PO CAPS Oral Take 1 capsule by mouth daily.      Marland Kitchen NISOLDIPINE ER 20 MG PO TB24  TAKE 1 TABLET BY MOUTH EVERY DAY 30 tablet 11    JPE  . POTASSIUM CHLORIDE CRYS ER 20 MEQ PO TBCR Oral Take 1 tablet (20 mEq total) by mouth 2 (two) times daily.    Marland Kitchen PREDNISONE 10 MG PO TABS Oral Take 10 mg by mouth daily.     Marland Kitchen PREVACID 24HR 15 MG PO CPDR   TAKE 1 CAPSULE TWICE DAILY 60 capsule 11  . QVAR 80 MCG/ACT IN AERS  INHALE 2 PUFFS 2 TIMES A DAY 8.7 g 5  . SERTRALINE HCL 25 MG PO TABS Oral Take 25 mg by mouth as needed.      BP 130/67  Pulse 66  Temp 98.3 F (36.8 C) (Oral)  Resp 18  SpO2 98%  Physical Exam  Nursing note and vitals reviewed. Constitutional: She is oriented to person, place, and time. She appears well-developed and well-nourished. No distress.  HENT:  Head: Normocephalic.  Right Ear: External ear normal.  Left Ear: External ear normal.  Nose: Nose normal.  Mouth/Throat: Oropharynx is clear and moist.       Swelling over the bridge of the nose, dried up blood in nares, no septal hematoma. No hemotympaneum  Eyes: Conjunctivae and EOM are normal. Pupils are equal, round, and reactive to light.  Neck: Normal range of motion. Neck supple.       No cervical spine tenderness  Cardiovascular: Normal rate, regular rhythm and normal heart sounds.   Pulmonary/Chest: Effort normal and breath sounds normal. No respiratory distress. She has no wheezes. She has no rales.  Abdominal: Soft. Bowel sounds are normal. She exhibits no distension. There is no tenderness. There is no rebound.  Musculoskeletal:       Right shoulder tenderness. No swelling, redness, bruising, deformity. Pain with any rom. Normal right elbow and wrist with no tenderness with palpation, full ROM. Normal right grip strength. Normal radial pulses  Neurological: She is alert and oriented to person, place, and time.  Skin: Skin is warm and dry.  Psychiatric: She has a normal mood and affect.    ED Course  Procedures (including critical care time)  Pt post mechanical fall. Will get x-rays, neurovascularly intact. Pt not wanting any pain medications at this time. She is AAOx3.   Results for orders placed in visit on 03/12/12  CBC WITH DIFFERENTIAL      Component Value Range   WBC 12.2 (*) 4.5 - 10.5 K/uL   RBC 4.74  3.87 - 5.11 Mil/uL   Hemoglobin  12.6  12.0 - 15.0 g/dL   HCT 16.1  09.6 - 04.5 %   MCV 82.8  78.0 - 100.0 fl   MCHC 32.1  30.0 - 36.0 g/dL   RDW 40.9 (*) 81.1 - 91.4 %   Platelets 391.0  150.0 - 400.0 K/uL   Neutrophils Relative 65.1  43.0 - 77.0 %   Lymphocytes Relative  19.9  12.0 - 46.0 %   Monocytes Relative 13.0 (*) 3.0 - 12.0 %   Eosinophils Relative 1.1  0.0 - 5.0 %   Basophils Relative 0.9  0.0 - 3.0 %   Neutro Abs 7.9 (*) 1.4 - 7.7 K/uL   Lymphs Abs 2.4  0.7 - 4.0 K/uL   Monocytes Absolute 1.6 (*) 0.1 - 1.0 K/uL   Eosinophils Absolute 0.1  0.0 - 0.7 K/uL   Basophils Absolute 0.1  0.0 - 0.1 K/uL  HEPATIC FUNCTION PANEL      Component Value Range   Total Bilirubin 0.5  0.3 - 1.2 mg/dL   Bilirubin, Direct 0.1  0.0 - 0.3 mg/dL   Alkaline Phosphatase 55  39 - 117 U/L   AST 19  0 - 37 U/L   ALT 15  0 - 35 U/L   Total Protein 7.5  6.0 - 8.3 g/dL   Albumin 3.4 (*) 3.5 - 5.2 g/dL  LIPID PANEL      Component Value Range   Cholesterol 175  0 - 200 mg/dL   Triglycerides 40.9  0.0 - 149.0 mg/dL   HDL 81.19  >14.78 mg/dL   VLDL 29.5  0.0 - 62.1 mg/dL   LDL Cholesterol 77  0 - 99 mg/dL   Total CHOL/HDL Ratio 2    VITAMIN D 25 HYDROXY      Component Value Range   Vit D, 25-Hydroxy 40  30 - 89 ng/mL  URINALYSIS, ROUTINE W REFLEX MICROSCOPIC      Component Value Range   Color, Urine YELLOW  Yellow;Lt. Yellow   APPearance CLEAR  Clear   Specific Gravity, Urine 1.015  1.000-1.030   pH 6.5  5.0 - 8.0   Total Protein, Urine NEGATIVE  Negative   Urine Glucose NEGATIVE  Negative   Ketones, ur NEGATIVE  Negative   Bilirubin Urine NEGATIVE  Negative   Hgb urine dipstick NEGATIVE  Negative   Urobilinogen, UA 0.2  0.0 - 1.0   Leukocytes, UA TRACE  Negative   Nitrite NEGATIVE  Negative   WBC, UA 3-6/hpf  0-2/hpf   RBC / HPF 0-2/hpf  0-2/hpf   Mucus, UA Presence of  None   Squamous Epithelial / LPF Rare(0-4/hpf)  Rare(0-4/hpf)   Dg Nasal Bones  04/29/2012  *RADIOLOGY REPORT*  Clinical Data: History of fall with  injury to the face.  Pain in the nose and nasal bleeding.  NASAL BONES - 3+ VIEW  Comparison: No priors.  Findings: Acute minimally displaced and minimally comminuted fracture of the nasal bones is noted. Overlying soft tissue swelling is present.  IMPRESSION: 1.  Acute minimally displaced minimally comminuted fractures of the nasal bones.  Original Report Authenticated By: Florencia Reasons, M.D.   Dg Shoulder Right  04/29/2012  *RADIOLOGY REPORT*  Clinical Data: History of fall complain of right-sided shoulder pain.  RIGHT SHOULDER - 2+ VIEW  Comparison: No priors.  Findings: There is an acute comminuted fracture of the right proximal humerus.  One portion of the fracture extends through the surgical neck, while another portion extends up to the greater tubercle.  The humeral head maintains a normal articulation with the glenoid.  There is approximate 1 cm of medial displacement of the humeral shaft.  Extensive reticulonodular changes noted in the visualized portions of the lungs, similar to prior examinations, likely representing chronic indolent atypical infectious process such as MAI (Mycobacterium avium-intracellulare).  IMPRESSION: 1.  Complex comminuted fracture of the right proximal humerus, as  above.  The humeral head remains located, but there is medial displacement of the humeral shaft.  Original Report Authenticated By: Florencia Reasons, M.D.   Ct Head Wo Contrast  04/29/2012  *RADIOLOGY REPORT*  Clinical Data:  History of trauma from a fall.  Lacerations to the lip.  CT HEAD WITHOUT CONTRAST CT CERVICAL SPINE WITHOUT CONTRAST  Technique:  Multidetector CT imaging of the head and cervical spine was performed following the standard protocol without intravenous contrast.  Multiplanar CT image reconstructions of the cervical spine were also generated.  Comparison:  Head CT 01/12/2011.  CT HEAD  Findings: No acute displaced skull fractures are identified.  No acute intracranial abnormalities.   Specifically, no signs of acute post-traumatic intracranial hemorrhage, no definite area of acute/subacute cerebral ischemia, no focal mass, mass effect, hydrocephalus or abnormal intra or extra-axial fluid collections. There is cerebral and cerebellar atrophy which is age appropriate. In addition, there are extensive patchy and confluent areas of decreased attenuation throughout the deep and periventricular white matter of the cerebral hemispheres bilaterally, most compatible with chronic microvascular ischemic disease.  Visualized paranasal sinuses and mastoids are remarkable for a postoperative changes of bilateral maxillary antrectomy, air fluid levels in the sphenoid sinuses, mucosal thickening throughout the ethmoid and maxillary sinuses bilaterally, and a small left mastoid effusion.  IMPRESSION: 1.  No acute displaced skull fractures or findings to suggest significant acute traumatic injury to the brain. 2.  Cerebral atrophy with extensive chronic microvascular ischemic changes in the white matter, as above, similar to prior study from 01/12/2011. 3.  Extensive paranasal sinus disease, as above, including air fluid levels in the sphenoid sinuses which could suggest acute sinusitis.  Clinical correlation is recommended. 4.  Small left mastoid effusion.  CT CERVICAL SPINE  Findings: No acute displaced cervical spine fractures are identified.  There is severe multilevel degenerative disc disease, most pronounced at C3-C4, C4-C5 and C5-C6.  Multilevel facet arthropathy is also noted.  This results in mild straightening of normal cervical lordosis. Additionally, there is 2 mm of anterolisthesis of C6 upon C7, which is favored to be chronic. Alignment is otherwise anatomic. Prevertebral soft tissues are normal.  Visualized portions of the upper thorax are remarkable for extensive peribronchovascular micronodularity in the lung apices, likely to reflect areas of mucoid impaction within terminal bronchioles.   IMPRESSION: 1.  No evidence of significant acute traumatic injury to the cervical spine. 2.  Severe multilevel degenerative disc disease and cervical spondylosis, as above. 3.  Extensive peribronchovascular micronodularity in the lung apices.  The appearance is suggestive of mucoid impaction within terminal bronchioles.  This is favored to represent an atypical infectious process such as Mycobacterium avium-intracellulare (MAI).  Original Report Authenticated By: Florencia Reasons, M.D.   Ct Cervical Spine Wo Contrast  04/29/2012  *RADIOLOGY REPORT*  Clinical Data:  History of trauma from a fall.  Lacerations to the lip.  CT HEAD WITHOUT CONTRAST CT CERVICAL SPINE WITHOUT CONTRAST  Technique:  Multidetector CT imaging of the head and cervical spine was performed following the standard protocol without intravenous contrast.  Multiplanar CT image reconstructions of the cervical spine were also generated.  Comparison:  Head CT 01/12/2011.  CT HEAD  Findings: No acute displaced skull fractures are identified.  No acute intracranial abnormalities.  Specifically, no signs of acute post-traumatic intracranial hemorrhage, no definite area of acute/subacute cerebral ischemia, no focal mass, mass effect, hydrocephalus or abnormal intra or extra-axial fluid collections. There is cerebral and cerebellar atrophy which  is age appropriate. In addition, there are extensive patchy and confluent areas of decreased attenuation throughout the deep and periventricular white matter of the cerebral hemispheres bilaterally, most compatible with chronic microvascular ischemic disease.  Visualized paranasal sinuses and mastoids are remarkable for a postoperative changes of bilateral maxillary antrectomy, air fluid levels in the sphenoid sinuses, mucosal thickening throughout the ethmoid and maxillary sinuses bilaterally, and a small left mastoid effusion.  IMPRESSION: 1.  No acute displaced skull fractures or findings to suggest  significant acute traumatic injury to the brain. 2.  Cerebral atrophy with extensive chronic microvascular ischemic changes in the white matter, as above, similar to prior study from 01/12/2011. 3.  Extensive paranasal sinus disease, as above, including air fluid levels in the sphenoid sinuses which could suggest acute sinusitis.  Clinical correlation is recommended. 4.  Small left mastoid effusion.  CT CERVICAL SPINE  Findings: No acute displaced cervical spine fractures are identified.  There is severe multilevel degenerative disc disease, most pronounced at C3-C4, C4-C5 and C5-C6.  Multilevel facet arthropathy is also noted.  This results in mild straightening of normal cervical lordosis. Additionally, there is 2 mm of anterolisthesis of C6 upon C7, which is favored to be chronic. Alignment is otherwise anatomic. Prevertebral soft tissues are normal.  Visualized portions of the upper thorax are remarkable for extensive peribronchovascular micronodularity in the lung apices, likely to reflect areas of mucoid impaction within terminal bronchioles.  IMPRESSION: 1.  No evidence of significant acute traumatic injury to the cervical spine. 2.  Severe multilevel degenerative disc disease and cervical spondylosis, as above. 3.  Extensive peribronchovascular micronodularity in the lung apices.  The appearance is suggestive of mucoid impaction within terminal bronchioles.  This is favored to represent an atypical infectious process such as Mycobacterium avium-intracellulare (MAI).  Original Report Authenticated By: Florencia Reasons, M.D.   Dg Knee Complete 4 Views Right  04/29/2012  *RADIOLOGY REPORT*  Clinical Data: Status post fall.  Knee pain.  RIGHT KNEE - COMPLETE 4+ VIEW  Comparison: None  Findings:  Lipohemarthrosis is identified within the suprapatellar joint space.  There is a fracture through the mid pole of the patella.  The fracture fragments are in near anatomic alignment.  No significant arthropathy.  No  radiopaque foreign bodies or soft tissue calcifications.  IMPRESSION:  1.  Patellar fracture which appears nondisplaced. 2.  Lipohemarthrosis.  Original Report Authenticated By: Rosealee Albee, M.D.    12:22 PM PT with nasal bone fractures, patellar fracture, right proximal humerus fx. Sling ordered. Pain medications ordered. Will try to ambulate pt.  CT head and c spine negative. Pt had some findings on her lungs cosistent with possible atypical infection, she has hx of chronic lung problems, on home oxygen, will follow up with her pulmonologist.   3:19 PM Pt unable to ambulate, also very dizzy when stands up. Orthostatics checked, she does not appear to be orthostatic. I repaired her laceration. Labs pending, will call for admission.   Date: 04/29/2012  Rate: 64  Rhythm: normal sinus rhythm  QRS Axis: normal  Intervals: normal  ST/T Wave abnormalities: nonspecific T wave changes  Conduction Disutrbances:none  Narrative Interpretation:   Old EKG Reviewed: unchanged    Spoke with Critical care since pts pcp, but asked for [facility] to admit. Spoke with [facility], will admit. Asked for ortho and ENT consult. Will make consults.   Spoke with Dr. Lazarus Salines, advised ice and elevation for nasal fracture. Wants to see pt in the office  next week. Asked for pt to call office and ask for Amy, his nurse to get pt an appointment.   Spoke with Dr. Shon Baton, who is on call for Dr. [name], will come and consult.     1. Fracture of humerus, right, closed   2. Right patella fracture   3. Dizzy   4. Nasal fracture   5. Lip laceration   6. Bronchiectasis without acute exacerbation   7. Fall due to stumbling   8. Fracture of humerus, distal, right, closed   9. Nasal bones, closed fracture   10. Patellar fracture       MDM         Lottie Mussel, PA 04/29/12 2028

## 2012-04-30 DIAGNOSIS — E876 Hypokalemia: Secondary | ICD-10-CM | POA: Diagnosis not present

## 2012-04-30 DIAGNOSIS — S42309A Unspecified fracture of shaft of humerus, unspecified arm, initial encounter for closed fracture: Secondary | ICD-10-CM

## 2012-04-30 LAB — BASIC METABOLIC PANEL
BUN: 10 mg/dL (ref 6–23)
Calcium: 8.5 mg/dL (ref 8.4–10.5)
GFR calc Af Amer: >90 mL/min (ref >90)
GFR calc non Af Amer: 82 mL/min — ABNORMAL LOW (ref >90)
Glucose, Bld: 94 mg/dL (ref 70–99)
Potassium: 3 mEq/L — ABNORMAL LOW (ref 3.5–5.1)
Sodium: 132 mEq/L — ABNORMAL LOW (ref 135–145)

## 2012-04-30 LAB — CBC
MCH: 26.6 pg (ref 26.0–34.0)
MCHC: 32 g/dL (ref 30.0–36.0)
RDW: 14.9 % (ref 11.5–15.5)

## 2012-04-30 LAB — SURGICAL PCR SCREEN: MRSA, PCR: POSITIVE — AB

## 2012-04-30 MED ORDER — POTASSIUM CHLORIDE CRYS ER 20 MEQ PO TBCR
40.0000 meq | EXTENDED_RELEASE_TABLET | Freq: Once | ORAL | Status: AC
  Administered 2012-04-30: 40 meq via ORAL
  Filled 2012-04-30: qty 2

## 2012-04-30 NOTE — Consult Note (Signed)
CC: right shoulder and right knee pain s/p fall HPI: 78 y/o female [place] and fell at church on [place]29[place]12 injuring right shoulder and right knee. Denies loc, denies any other injuries Denies chest pain, headache dizziness or syncope as reason for her fall other than tripping on oxygen tubing PMH: hypothyroid, hyperlipidemia, TIA, hypertension, GERD, depression, bronchiectasis Social: lives alone, non smoker. Non drinker Meds: see above Allergies: codeine, chocolate, erythromycin, septra, iodine Family: hypertension, coronary heart disease, stroke, multiple myeloma PE: alert and appropriate 78 y/o female in no acute distress alert and oriented x 3 Right shoulder: minimal effusion, nv intact distally Pt not taken through full rom due to known fracture nv intact distally bilaterally Right knee: minimal edema right knee, no skin breakdown No sign of open injury nv intact distally No pedal edema X-rays: right patella fracture non displaced,  Right comminuted proximal humerus fracture  Assessment: right patealla and right proximal humerus fracture Plan: Pt may bear weight on the right leg as long and the knee immobilizer stays on at all  Continue use of the sling Pt does not need any surgery for either injury at this time. Recommend conservative treatment. No lifting pushing or pulling with the right upper arm Pt in agreement Fu with Dr. Ranell Patrick in 2 weeks for x-rays Will continue to monitor her progress Pain control

## 2012-04-30 NOTE — Care Management Note (Signed)
    Page 1 of 2   05/02/2012     2:59:22 PM   CARE MANAGEMENT NOTE 05/02/2012  Patient:  [name], [name]   Account Number:  [id number]  Date Initiated:  04/30/2012  Documentation initiated by:  Lorenda Ishihara  Subjective/Objective Assessment:   78 yo female admitted s/p fall with facial fractures, patella fracture, humerus fracture. PTA lived at home alone.     Action/Plan:   Plan for home with private duty 24/7, home health   Anticipated DC Date:  05/03/2012   Anticipated DC Plan:  HOME W HOME HEALTH SERVICES  In-house referral  Clinical Social Worker      DC Associate Professor  CM consult      PAC Choice  DURABLE MEDICAL EQUIPMENT  HOME HEALTH   Choice offered to / List presented to:  C-1 Patient   DME arranged  WALKER - HEMI  WHEELCHAIR - MANUAL  3-N-1      DME agency  [facility]     HH arranged  HH-2 PT  HH-3 OT  HH-8 PCS/PERSONAL CARE SERVICES      HH agency  [facility]  [facility]   Status of service:  Completed, signed off Medicare Important Message given?   (If response is "NO", the following Medicare IM given date fields will be blank) Date Medicare IM given:   Date Additional Medicare IM given:    Discharge Disposition:  HOME W HOME HEALTH SERVICES  Per UR Regulation:  Reviewed for med. necessity/level of care/duration of stay  If discussed at Long Length of Stay Meetings, dates discussed:    Comments:  05-02-12 Lorenda Ishihara RN CM [phone] Spoke with patient at bedside earlier this am. States he has made arrangements with [facility] for 24/7 caregivers, states she has used them in the past and feels comfortable with this d/c plan. Discussed need for [facility] skilled services as well, patient has used [facility] in the past and would like to use them again. Has home O2 already arranged. Discussed HH services with Darl Pikes at [facility], spoke with Darrian with [facility] about requested DME. Information left for attending to document necessity  for DME.

## 2012-04-30 NOTE — Clinical Documentation Improvement (Signed)
RESPIRATORY FAILURE DOCUMENTATION CLARIFICATION QUERY   THIS DOCUMENT IS NOT A PERMANENT PART OF THE MEDICAL RECORD  TO RESPOND TO THE THIS QUERY, FOLLOW THE INSTRUCTIONS BELOW:  1. If needed, update documentation for the patient's encounter via the notes activity.  2. Access this query again and click edit on the In [employer].  3. After updating, or not, click F2 to complete all highlighted (required) fields concerning your review. Select "additional documentation in the medical record" OR "no additional documentation provided".  4. Click Sign note button.  5. The deficiency will fall out of your In Basket *Please let us know if you are not able to complete this workflow by phone or e-mail (listed below).  Please update your documentation within the medical record to reflect your response to this query.                                                                                    04/30/12  Dear Dr.Dhungel Marton Redwood,  In a better effort to capture your patient's severity of illness, reflect appropriate length of stay and utilization of resources, a review of the patient medical record has revealed the following indicators.    Based on your clinical judgment, please clarify and document in a progress note and/or discharge summary the clinical condition associated with the following supporting information:  In responding to this query please exercise your independent judgment.  The fact that a query is asked, does not imply that any particular answer is desired or expected. Pt admitted with multiple  fractures after a fall. According to  notes pt is on home O2 @ 2L/Dawn If possible , please specify if  pt  respiratory status could  be further clarified in progress notes as any of listings below.  Thank you for your exceptional documentation Possible Clinical Conditions?  _Acute Respiratory Failure _Acute on Chronic Respiratory Failure _Chronic Respiratory Failure _Acute  Respiratory Insufficiency _Acute Respiratory Insufficiency following surgery or trauma  _______Other Condition________________ _______Cannot Clinically Determine    Supporting Information: Risk Factors:  Bronchiectasis without acute exacerbation   Signs&Symptoms: per  ED note: " with a history of bronchiectasis on home oxygen "  per progress notes 04/29/2012 : " Bilateral coarse crackles"     " On chronic home O2 and Prednisone which will be continued, continue with nebs "   " Patient tripped over oxygen cord"  Treatment:  O2/@2L /min per [place] at home  Respiratory Treatment: Albuterol  neb  q6 hrs                        You may use possible, probable, or suspect with inpatient documentation. possible, probable, suspected diagnoses MUST be documented at the time of discharge  Reviewed:  no additional documentation provided  (GT)  No response on d/c summary  Thank You,  Andy Gauss RN  Clinical Documentation Specialist:  Pager [phone] E-mail [email or web address]   Health Information Management [place]

## 2012-04-30 NOTE — Progress Notes (Signed)
[facility] PROGRESS NOTE  [name] AVW:[id number] DOB: 11/30/1932 DOA: 04/29/2012 PCP: [name], MD    Brief narrative: 78 year old female with a history of bronchiectasis on home oxygen follows with Dr. [name] , cheerful TIA, hypothyroidism, history of GERD, hiatal hernia who came to the hospital after falling on a hard surface and landed on her face her right shoulder and the right knee and had comminuted fracture of right humerus, rt patella and bridge of the nose.  Assessment/ Plan  Fall due to stumbling  -Patient tripped on a cord and sustained nondisplaced fracture of the bridge of the nose, displaced comminuted right humeral fracture, and nondisplaced fracture of the right patella.  -She has a upper arm sling and right knee immobilizer in place.  -Patient will be admitted to MedSurg  -Pain control with when necessary Vicodin and low dose when necessary morphine for severe pain  -appreciate ortho recs. No need for surgical intervention. PT eval pending. Will follow up.   Nasal bones, closed fracture  No bleeding currently. Head and CT cervical spine within normal limits  ENT surgeon Dr. Lazarus Salines was called from the ED who advised ice and elevation for nasal fracture. He he Wants to see pt in the office next week. Asked for pt to call office and ask for Amy, his nurse to get pt an appointment.   HYPOTHYROIDISM  Continue with Synthroid   HYPERTENSION, BENIGN  Purposes stable continue home meds   Leukocytosis  The due to acute stress and chronic prednisone   Chronic Bronchiectasis  On chronic home O2 and prednisone which will be continued, continue with nebs   GERD  Continue PPI   History of TIA  Resume plavix once H&h stable   DVT prophylaxis  Subcutaneous Lovenox   Diet  Cardiac   Code Status: full  Disposition Plan: pending PT eval     Consultants:  [facility] ( ortho)  Procedures:  none  Antibiotics:  none  HPI/Subjective: Feels  her pain well controlled on current meds. No nasal bleed  Objective: Filed Vitals:   04/29/12 1650 04/29/12 2210 04/30/12 0440 04/30/12 0900  BP: 169/77 158/69    Pulse: 66 78 84   Temp: 98.2 F (36.8 C) 99.1 F (37.3 C)    TempSrc: Oral Oral    Resp: 18 18 20    Height: 5\' 2"  (1.575 m)     Weight: 51.256 kg (113 lb)     SpO2: 94% 96%  92%    Intake/Output Summary (Last 24 hours) at 04/30/12 1408 Last data filed at 04/30/12 1239  Gross per 24 hour  Intake 1426.25 ml  Output   1000 ml  Net 426.25 ml    Exam:  HEENT: Dry blood in the nasal cavity, swollen lower lip with suturing of the laceration,  no pallor no icterus, moist oral mucosa,  CVS: NS1& S2, No murmurs Pulmonary/Chest: Bilateral coarse crackles no wheezes, rales, or rhonchi  Abdominal: Soft. Non-tender, non-distended, bowel sounds are normal, no masses, organomegaly, or guarding present.  GU: no CVA tenderness Musculoskeletal: No joint deformities, erythema, or stiffness, ROM full and no nontender Ext: Sling over her right humerus, knee right knee immobilizer applied no edema and no cyanosis, pulses palpable bilaterally (DP and PT)  Neurological: A&O x3,non focal Data Reviewed: Basic Metabolic Panel:  Lab 04/30/12 8295 04/29/12 1337  NA 132* 134*  K 3.0* 3.8  CL 98 99  CO2 26 25  GLUCOSE 94 128*  BUN 10 10  CREATININE  0.70 0.71  CALCIUM 8.5 8.4  MG -- --  PHOS -- --   Liver Function Tests:  Lab 04/29/12 1337  AST 18  ALT 11  ALKPHOS 50  BILITOT 0.3  PROT 6.5  ALBUMIN 3.0*   No results found for this basename: LIPASE:5,AMYLASE:5 in the last 168 hours No results found for this basename: AMMONIA:5 in the last 168 hours CBC:  Lab 04/30/12 0424 04/29/12 1337  WBC 10.7* 16.2*  NEUTROABS -- 14.3*  HGB 10.6* 11.2*  HCT 33.1* 35.1*  MCV 83.0 83.0  PLT 291 316   Cardiac Enzymes: No results found for this basename: CKTOTAL:5,CKMB:5,CKMBINDEX:5,TROPONINI:5 in the last 168 hours BNP (last 3  results)  Basename 02/17/12 1011  PROBNP 50.0   CBG: No results found for this basename: GLUCAP:5 in the last 168 hours  Recent Results (from the past 240 hour(s))  SURGICAL PCR SCREEN     Status: Abnormal   Collection Time   04/30/12  4:31 AM      Component Value Range Status Comment   MRSA, PCR POSITIVE (*) NEGATIVE Final    Staphylococcus aureus POSITIVE (*) NEGATIVE Final      Studies: Dg Nasal Bones  04/29/2012  *RADIOLOGY REPORT*  Clinical Data: History of fall with injury to the face.  Pain in the nose and nasal bleeding.  NASAL BONES - 3+ VIEW  Comparison: No priors.  Findings: Acute minimally displaced and minimally comminuted fracture of the nasal bones is noted. Overlying soft tissue swelling is present.  IMPRESSION: 1.  Acute minimally displaced minimally comminuted fractures of the nasal bones.  Original Report Authenticated By: Florencia Reasons, M.D.   Dg Shoulder Right  04/29/2012  *RADIOLOGY REPORT*  Clinical Data: History of fall complain of right-sided shoulder pain.  RIGHT SHOULDER - 2+ VIEW  Comparison: No priors.  Findings: There is an acute comminuted fracture of the right proximal humerus.  One portion of the fracture extends through the surgical neck, while another portion extends up to the greater tubercle.  The humeral head maintains a normal articulation with the glenoid.  There is approximate 1 cm of medial displacement of the humeral shaft.  Extensive reticulonodular changes noted in the visualized portions of the lungs, similar to prior examinations, likely representing chronic indolent atypical infectious process such as MAI (Mycobacterium avium-intracellulare).  IMPRESSION: 1.  Complex comminuted fracture of the right proximal humerus, as above.  The humeral head remains located, but there is medial displacement of the humeral shaft.  Original Report Authenticated By: Florencia Reasons, M.D.   Ct Head Wo Contrast  04/29/2012  *RADIOLOGY REPORT*  Clinical Data:   History of trauma from a fall.  Lacerations to the lip.  CT HEAD WITHOUT CONTRAST CT CERVICAL SPINE WITHOUT CONTRAST  Technique:  Multidetector CT imaging of the head and cervical spine was performed following the standard protocol without intravenous contrast.  Multiplanar CT image reconstructions of the cervical spine were also generated.  Comparison:  Head CT 01/12/2011.  CT HEAD  Findings: No acute displaced skull fractures are identified.  No acute intracranial abnormalities.  Specifically, no signs of acute post-traumatic intracranial hemorrhage, no definite area of acute/subacute cerebral ischemia, no focal mass, mass effect, hydrocephalus or abnormal intra or extra-axial fluid collections. There is cerebral and cerebellar atrophy which is age appropriate. In addition, there are extensive patchy and confluent areas of decreased attenuation throughout the deep and periventricular white matter of the cerebral hemispheres bilaterally, most compatible with chronic microvascular ischemic disease.  Visualized paranasal sinuses and mastoids are remarkable for a postoperative changes of bilateral maxillary antrectomy, air fluid levels in the sphenoid sinuses, mucosal thickening throughout the ethmoid and maxillary sinuses bilaterally, and a small left mastoid effusion.  IMPRESSION: 1.  No acute displaced skull fractures or findings to suggest significant acute traumatic injury to the brain. 2.  Cerebral atrophy with extensive chronic microvascular ischemic changes in the white matter, as above, similar to prior study from 01/12/2011. 3.  Extensive paranasal sinus disease, as above, including air fluid levels in the sphenoid sinuses which could suggest acute sinusitis.  Clinical correlation is recommended. 4.  Small left mastoid effusion.  CT CERVICAL SPINE  Findings: No acute displaced cervical spine fractures are identified.  There is severe multilevel degenerative disc disease, most pronounced at C3-C4, C4-C5 and  C5-C6.  Multilevel facet arthropathy is also noted.  This results in mild straightening of normal cervical lordosis. Additionally, there is 2 mm of anterolisthesis of C6 upon C7, which is favored to be chronic. Alignment is otherwise anatomic. Prevertebral soft tissues are normal.  Visualized portions of the upper thorax are remarkable for extensive peribronchovascular micronodularity in the lung apices, likely to reflect areas of mucoid impaction within terminal bronchioles.  IMPRESSION: 1.  No evidence of significant acute traumatic injury to the cervical spine. 2.  Severe multilevel degenerative disc disease and cervical spondylosis, as above. 3.  Extensive peribronchovascular micronodularity in the lung apices.  The appearance is suggestive of mucoid impaction within terminal bronchioles.  This is favored to represent an atypical infectious process such as Mycobacterium avium-intracellulare (MAI).  Original Report Authenticated By: Florencia Reasons, M.D.   Ct Cervical Spine Wo Contrast  04/29/2012  *RADIOLOGY REPORT*  Clinical Data:  History of trauma from a fall.  Lacerations to the lip.  CT HEAD WITHOUT CONTRAST CT CERVICAL SPINE WITHOUT CONTRAST  Technique:  Multidetector CT imaging of the head and cervical spine was performed following the standard protocol without intravenous contrast.  Multiplanar CT image reconstructions of the cervical spine were also generated.  Comparison:  Head CT 01/12/2011.  CT HEAD  Findings: No acute displaced skull fractures are identified.  No acute intracranial abnormalities.  Specifically, no signs of acute post-traumatic intracranial hemorrhage, no definite area of acute/subacute cerebral ischemia, no focal mass, mass effect, hydrocephalus or abnormal intra or extra-axial fluid collections. There is cerebral and cerebellar atrophy which is age appropriate. In addition, there are extensive patchy and confluent areas of decreased attenuation throughout the deep and  periventricular white matter of the cerebral hemispheres bilaterally, most compatible with chronic microvascular ischemic disease.  Visualized paranasal sinuses and mastoids are remarkable for a postoperative changes of bilateral maxillary antrectomy, air fluid levels in the sphenoid sinuses, mucosal thickening throughout the ethmoid and maxillary sinuses bilaterally, and a small left mastoid effusion.  IMPRESSION: 1.  No acute displaced skull fractures or findings to suggest significant acute traumatic injury to the brain. 2.  Cerebral atrophy with extensive chronic microvascular ischemic changes in the white matter, as above, similar to prior study from 01/12/2011. 3.  Extensive paranasal sinus disease, as above, including air fluid levels in the sphenoid sinuses which could suggest acute sinusitis.  Clinical correlation is recommended. 4.  Small left mastoid effusion.  CT CERVICAL SPINE  Findings: No acute displaced cervical spine fractures are identified.  There is severe multilevel degenerative disc disease, most pronounced at C3-C4, C4-C5 and C5-C6.  Multilevel facet arthropathy is also noted.  This results in mild straightening  of normal cervical lordosis. Additionally, there is 2 mm of anterolisthesis of C6 upon C7, which is favored to be chronic. Alignment is otherwise anatomic. Prevertebral soft tissues are normal.  Visualized portions of the upper thorax are remarkable for extensive peribronchovascular micronodularity in the lung apices, likely to reflect areas of mucoid impaction within terminal bronchioles.  IMPRESSION: 1.  No evidence of significant acute traumatic injury to the cervical spine. 2.  Severe multilevel degenerative disc disease and cervical spondylosis, as above. 3.  Extensive peribronchovascular micronodularity in the lung apices.  The appearance is suggestive of mucoid impaction within terminal bronchioles.  This is favored to represent an atypical infectious process such as  Mycobacterium avium-intracellulare (MAI).  Original Report Authenticated By: Florencia Reasons, M.D.   Ct Shoulder Right Wo Contrast  04/29/2012  *RADIOLOGY REPORT*  Clinical Data:  Fall.  Right shoulder pain.  CT RIGHT SHOULDER WITHOUT CONTRAST  Technique:  Multidetector CT imaging of the right shoulder was performed according to the standard protocol without intravenous contrast. Multiplanar CT image reconstructions were also generated.  Comparison:  Radiographs 04/29/2012.  Findings:  Multiple areas of mucoid impaction in the right lung are present, which correlates with the chest radiograph.  No displaced right-sided rib fractures.  Right sternoclavicular joint osteoarthritis.  The right scapula is intact.  There is a comminuted fracture of the right humeral metaphysis.  The fracture planes extend into the anatomic and surgical neck of the right humerus.  The articular surfaces intact. Mild to moderate right glenohumeral osteoarthritis is present.  The humeral head fracture fragments are nondisplaced.  There is a greater tuberosity fracture.  The lesser tuberosity and bicipital groove are intact. Comminuted proximal metaphyseal fracture is present.  The shaft is displaced about one half shaft width posteriorly.  There is mild medial displacement of the shaft.  Expected hemorrhage and hemarthrosis around the shoulder. Cystic changes are present in the anterior humeral head, likely associated with rotator cuff pathology.  There is no rotator cuff muscular atrophy.  IMPRESSION: 1.  Comminuted three-part fracture of the right humerus.  There is no fracture of the lesser tuberosity.  No displacement of the greater tuberosity fragment from the articular surface. 2.  Transverse metaphyseal fracture is comminuted and mildly displaced. 3. Mild moderate glenohumeral osteoarthritis.  3-DIMENSIONAL CT IMAGE RENDERING AT CT SCANNER:  Technique:  3-dimensional CT images were rendered by post- processing of the original CT  data at the CT scanner.  The 3- dimensional CT images were interpreted, and findings were reported in the accompanying complete CT report for this study.  Original Report Authenticated By: Andreas Newport, M.D.   Ct 3d Recon At Scanner  04/29/2012  *RADIOLOGY REPORT*  Clinical Data:  Fall.  Right shoulder pain.  CT RIGHT SHOULDER WITHOUT CONTRAST  Technique:  Multidetector CT imaging of the right shoulder was performed according to the standard protocol without intravenous contrast. Multiplanar CT image reconstructions were also generated.  Comparison:  Radiographs 04/29/2012.  Findings:  Multiple areas of mucoid impaction in the right lung are present, which correlates with the chest radiograph.  No displaced right-sided rib fractures.  Right sternoclavicular joint osteoarthritis.  The right scapula is intact.  There is a comminuted fracture of the right humeral metaphysis.  The fracture planes extend into the anatomic and surgical neck of the right humerus.  The articular surfaces intact. Mild to moderate right glenohumeral osteoarthritis is present.  The humeral head fracture fragments are nondisplaced.  There is a greater tuberosity fracture.  The lesser tuberosity and bicipital groove are intact. Comminuted proximal metaphyseal fracture is present.  The shaft is displaced about one half shaft width posteriorly.  There is mild medial displacement of the shaft.  Expected hemorrhage and hemarthrosis around the shoulder. Cystic changes are present in the anterior humeral head, likely associated with rotator cuff pathology.  There is no rotator cuff muscular atrophy.  IMPRESSION: 1.  Comminuted three-part fracture of the right humerus.  There is no fracture of the lesser tuberosity.  No displacement of the greater tuberosity fragment from the articular surface. 2.  Transverse metaphyseal fracture is comminuted and mildly displaced. 3. Mild moderate glenohumeral osteoarthritis.  3-DIMENSIONAL CT IMAGE RENDERING AT  CT SCANNER:  Technique:  3-dimensional CT images were rendered by post- processing of the original CT data at the CT scanner.  The 3- dimensional CT images were interpreted, and findings were reported in the accompanying complete CT report for this study.  Original Report Authenticated By: Andreas Newport, M.D.   Dg Knee Complete 4 Views Right  04/29/2012  *RADIOLOGY REPORT*  Clinical Data: Status post fall.  Knee pain.  RIGHT KNEE - COMPLETE 4+ VIEW  Comparison: None  Findings:  Lipohemarthrosis is identified within the suprapatellar joint space.  There is a fracture through the mid pole of the patella.  The fracture fragments are in near anatomic alignment.  No significant arthropathy.  No radiopaque foreign bodies or soft tissue calcifications.  IMPRESSION:  1.  Patellar fracture which appears nondisplaced. 2.  Lipohemarthrosis.  Original Report Authenticated By: Rosealee Albee, M.D.    Scheduled Meds:   . sodium chloride   Intravenous STAT  . albuterol  2.5 mg Nebulization Q6H  . bisoprolol  5 mg Oral Daily  . calcium-vitamin D  1 tablet Oral BID  . ciclesonide  2 spray Each Nare Daily  . docusate sodium  100 mg Oral BID  . enoxaparin (LOVENOX) injection  40 mg Subcutaneous Q24H  . fluticasone  1 puff Inhalation BID  . latanoprost  1 drop Both Eyes QHS  . multivitamin with minerals  1 tablet Oral Daily  . nisoldipine  17 mg Oral Daily  . pantoprazole  40 mg Oral Q1200  . potassium chloride  40 mEq Oral Once  . predniSONE  10 mg Oral Q breakfast  . sertraline  25 mg Oral QHS  . DISCONTD: ciclesonide  2 spray Each Nare Daily  . DISCONTD: lansoprazole  30 mg Oral Daily  . DISCONTD: pantoprazole  40 mg Oral Q1200   Continuous Infusions:   . sodium chloride 75 mL/hr at 04/30/12 1000      Time spent: 30 minutes    Gregorey Nabor  [facility] Pager [phone]. If 8PM-8AM, please contact night-coverage at [email or web address], password [facility] 04/30/2012, 2:08 PM  LOS: 1 day

## 2012-05-01 ENCOUNTER — Inpatient Hospital Stay (HOSPITAL_COMMUNITY): Payer: Medicare Other

## 2012-05-01 DIAGNOSIS — I10 Essential (primary) hypertension: Secondary | ICD-10-CM

## 2012-05-01 MED ORDER — CHLORHEXIDINE GLUCONATE CLOTH 2 % EX PADS
6.0000 | MEDICATED_PAD | Freq: Every day | CUTANEOUS | Status: DC
  Administered 2012-05-01 – 2012-05-02 (×2): 6 via TOPICAL

## 2012-05-01 MED ORDER — MUPIROCIN 2 % EX OINT
1.0000 "application " | TOPICAL_OINTMENT | Freq: Two times a day (BID) | CUTANEOUS | Status: DC
  Administered 2012-05-01 – 2012-05-02 (×3): 1 via NASAL
  Filled 2012-05-01: qty 22

## 2012-05-01 MED ORDER — HYDRALAZINE HCL 10 MG PO TABS
10.0000 mg | ORAL_TABLET | Freq: Four times a day (QID) | ORAL | Status: DC | PRN
  Filled 2012-05-01: qty 1

## 2012-05-01 MED ORDER — CLOPIDOGREL BISULFATE 75 MG PO TABS
75.0000 mg | ORAL_TABLET | Freq: Every day | ORAL | Status: DC
  Administered 2012-05-02: 75 mg via ORAL
  Filled 2012-05-01 (×3): qty 1

## 2012-05-01 NOTE — Plan of Care (Signed)
Problem: Phase I Progression Outcomes Goal: OOB as tolerated unless otherwise ordered Outcome: Progressing Patient was able to tolerate walking a short distance in the halls with physical therapy then sit in the chair at the bedside today.

## 2012-05-01 NOTE — Progress Notes (Signed)
Orthopedics Progress Note  Subjective: I am hoping to go home tomorrow.  The shoulder does not hurt as long as I don't move it. Objective:  Filed Vitals:   05/01/12 0626  BP: 174/60  Pulse: 79  Temp: 99.5 F (37.5 C)  Resp: 16    General: Awake and alert  Musculoskeletal: Right shoulder swollen and tender, bruising resolving. ROM not assessed due to fracture Right LE in knee immobilizer Neurovascularly intact  Lab Results  Component Value Date   WBC 10.7* 04/30/2012   HGB 10.6* 04/30/2012   HCT 33.1* 04/30/2012   MCV 83.0 04/30/2012   PLT 291 04/30/2012       Component Value Date/Time   NA 132* 04/30/2012 0424   K 3.0* 04/30/2012 0424   CL 98 04/30/2012 0424   CO2 26 04/30/2012 0424   GLUCOSE 94 04/30/2012 0424   BUN 10 04/30/2012 0424   CREATININE 0.70 04/30/2012 0424   CALCIUM 8.5 04/30/2012 0424   GFRNONAA 82* 04/30/2012 0424   GFRAA >90 04/30/2012 0424    Lab Results  Component Value Date   INR 1.04 01/12/2011   INR 1.1 07/02/2008   INR 1.2 06/13/2008    Assessment/Plan: s/p fall with right displaced proximal humerus fracture and right non-displaced patella fracture Non-surgical care planned for both the shoulder and the knee. WBAT with knee immobilizer on all the time on the right knee.  No ROM for the right knee. Right shoulder should remain immobile for 4 weeks prior to starting any ROM.  She does not need the sling in bed. Position the arm in the hugging position with the arm across her waist and a pillow propped under the elbow.  Sling wear should be reserved for walking or transferring. Will need outpatient ortho follow up in two weeks in my office. [facility] [phone] Thanks!!!   Almedia Balls. Ranell Patrick, MD 05/01/2012 5:21 PM

## 2012-05-01 NOTE — Plan of Care (Signed)
Problem: Phase I Progression Outcomes Goal: Initial discharge plan identified Outcome: Progressing Possible plans for discharge to home with home health tomorrow planned.  Care management consult initiated.

## 2012-05-01 NOTE — Progress Notes (Signed)
[facility] PROGRESS NOTE  [name] AVW:[id number] DOB: [birth year] DOA: 04/29/2012 PCP: [name], MD  Brief narrative:  78 year old female with a history of bronchiectasis on home oxygen follows with Dr. [name] , cheerful TIA, hypothyroidism, history of GERD, hiatal hernia who came to the hospital after falling on a hard surface and landed on her face her right shoulder and the right knee and had comminuted fracture of right humerus, rt patella and bridge of the nose.    Assessment/ Plan   Fall due to stumbling  -Patient tripped on a cord and sustained nondisplaced fracture of the bridge of the nose, displaced comminuted right humeral fracture, and nondisplaced fracture of the right patella.  -She has a upper arm sling and right knee immobilizer in place.  -Pain control with when necessary Vicodin and low dose when necessary morphine for severe pain  -appreciate ortho recs. No need for surgical intervention and to follow up with Dr Ranell Patrick in 2 weeks as outpt with repeat x rays. Continue rt arm sling and knee immobilizer.   Nasal bones, closed fracture  No bleeding currently. Head and CT cervical spine within normal limits  ENT surgeon Dr. Lazarus Salines was called from the ED who advised ice and elevation for nasal fracture. He wants to see pt in the office next week. Asked for pt to call office and ask for Amy, his nurse to get pt an appointment.   HYPOTHYROIDISM  Continue with Synthroid   HYPERTENSION, BENIGN  Purposes stable continue home meds   Fever: Temp of 100.6 overnight. CXR and UA unremarkable.  Monitor for now  Leukocytosis  The due to acute stress and chronic prednisone   Chronic Bronchiectasis  On chronic home O2 and prednisone which will be continued, continue with nebs   GERD  Continue PPI   History of TIA  Resume plavix  DVT prophylaxis  Subcutaneous Lovenox   Diet  Cardiac   Code Status: full   Disposition Plan: PT recommends SNF. SW consult  placed  Consultants:  [facility]/ [facility] ( ortho)  Follow up with Dr Ranell Patrick in 2 weeks  Follow up with Dr Lazarus Salines ( ENT) in 1 week after d/c   Procedures:  none Antibiotics:  none    HPI/Subjective: Had Temp of 100.6 last evening. Asymptomatic. Pain well controlled on current meds  Objective: Filed Vitals:   04/30/12 2040 05/01/12 0114 05/01/12 0626 05/01/12 0904  BP:   174/60   Pulse:   79   Temp:   99.5 F (37.5 C)   TempSrc:      Resp:   16   Height:      Weight:      SpO2: 96% 95% 97% 93%    Intake/Output Summary (Last 24 hours) at 05/01/12 1641 Last data filed at 05/01/12 1512  Gross per 24 hour  Intake   2280 ml  Output   1600 ml  Net    680 ml    Exam: HEENT: Dry blood in the nasal cavity, swollen lower lip with suturing of the laceration, no pallor no icterus, moist oral mucosa,  CVS: NS1& S2, No murmurs  Pulmonary/Chest: Bilateral coarse crackles no wheezes, rales, or rhonchi  Abdominal: Soft. Non-tender, non-distended, bowel sounds are normal, no masses, organomegaly, or guarding present.  GU: no CVA tenderness Musculoskeletal: No joint deformities, erythema, or stiffness, ROM full and no nontender Ext: Sling over her right humerus, knee right knee immobilizer applied no edema and no cyanosis, pulses palpable bilaterally (DP  and PT)  Neurological: A&O x3,non focal     Data Reviewed: Basic Metabolic Panel:  Lab 04/30/12 1610 04/29/12 1337  NA 132* 134*  K 3.0* 3.8  CL 98 99  CO2 26 25  GLUCOSE 94 128*  BUN 10 10  CREATININE 0.70 0.71  CALCIUM 8.5 8.4  MG -- --  PHOS -- --   Liver Function Tests:  Lab 04/29/12 1337  AST 18  ALT 11  ALKPHOS 50  BILITOT 0.3  PROT 6.5  ALBUMIN 3.0*   No results found for this basename: LIPASE:5,AMYLASE:5 in the last 168 hours No results found for this basename: AMMONIA:5 in the last 168 hours CBC:  Lab 04/30/12 0424 04/29/12 1337  WBC 10.7* 16.2*  NEUTROABS -- 14.3*  HGB 10.6* 11.2*  HCT 33.1*  35.1*  MCV 83.0 83.0  PLT 291 316   Cardiac Enzymes: No results found for this basename: CKTOTAL:5,CKMB:5,CKMBINDEX:5,TROPONINI:5 in the last 168 hours BNP (last 3 results)  Basename 02/17/12 1011  PROBNP 50.0   CBG: No results found for this basename: GLUCAP:5 in the last 168 hours  Recent Results (from the past 240 hour(s))  SURGICAL PCR SCREEN     Status: Abnormal   Collection Time   04/30/12  4:31 AM      Component Value Range Status Comment   MRSA, PCR POSITIVE (*) NEGATIVE Final    Staphylococcus aureus POSITIVE (*) NEGATIVE Final      Studies: Dg Nasal Bones  04/29/2012  *RADIOLOGY REPORT*  Clinical Data: History of fall with injury to the face.  Pain in the nose and nasal bleeding.  NASAL BONES - 3+ VIEW  Comparison: No priors.  Findings: Acute minimally displaced and minimally comminuted fracture of the nasal bones is noted. Overlying soft tissue swelling is present.  IMPRESSION: 1.  Acute minimally displaced minimally comminuted fractures of the nasal bones.  Original Report Authenticated By: Florencia Reasons, M.D.   Dg Chest 2 View  05/01/2012  *RADIOLOGY REPORT*  Clinical Data: Cough.  Left lower chest pain.  Leukocytosis.  CHEST - 2 VIEW  Comparison: Chest x-ray dated 02/17/2012 and 09/21/2011  Findings: The patient has extensive chronic lung disease, more prominent on the right than the left and most prominent in the lung bases.  No acute pulmonary abnormality.  The heart size and pulmonary vascularity are normal.  No effusions.  Acute comminuted fracture of the proximal right humerus.  IMPRESSION: No acute pulmonary disease.  Extensive chronic lung disease, stable.  Original Report Authenticated By: Gwynn Burly, M.D.   Dg Shoulder Right  04/29/2012  *RADIOLOGY REPORT*  Clinical Data: History of fall complain of right-sided shoulder pain.  RIGHT SHOULDER - 2+ VIEW  Comparison: No priors.  Findings: There is an acute comminuted fracture of the right proximal humerus.   One portion of the fracture extends through the surgical neck, while another portion extends up to the greater tubercle.  The humeral head maintains a normal articulation with the glenoid.  There is approximate 1 cm of medial displacement of the humeral shaft.  Extensive reticulonodular changes noted in the visualized portions of the lungs, similar to prior examinations, likely representing chronic indolent atypical infectious process such as MAI (Mycobacterium avium-intracellulare).  IMPRESSION: 1.  Complex comminuted fracture of the right proximal humerus, as above.  The humeral head remains located, but there is medial displacement of the humeral shaft.  Original Report Authenticated By: Florencia Reasons, M.D.   Ct Head Wo Contrast  04/29/2012  *  RADIOLOGY REPORT*  Clinical Data:  History of trauma from a fall.  Lacerations to the lip.  CT HEAD WITHOUT CONTRAST CT CERVICAL SPINE WITHOUT CONTRAST  Technique:  Multidetector CT imaging of the head and cervical spine was performed following the standard protocol without intravenous contrast.  Multiplanar CT image reconstructions of the cervical spine were also generated.  Comparison:  Head CT 01/12/2011.  CT HEAD  Findings: No acute displaced skull fractures are identified.  No acute intracranial abnormalities.  Specifically, no signs of acute post-traumatic intracranial hemorrhage, no definite area of acute/subacute cerebral ischemia, no focal mass, mass effect, hydrocephalus or abnormal intra or extra-axial fluid collections. There is cerebral and cerebellar atrophy which is age appropriate. In addition, there are extensive patchy and confluent areas of decreased attenuation throughout the deep and periventricular white matter of the cerebral hemispheres bilaterally, most compatible with chronic microvascular ischemic disease.  Visualized paranasal sinuses and mastoids are remarkable for a postoperative changes of bilateral maxillary antrectomy, air fluid levels  in the sphenoid sinuses, mucosal thickening throughout the ethmoid and maxillary sinuses bilaterally, and a small left mastoid effusion.  IMPRESSION: 1.  No acute displaced skull fractures or findings to suggest significant acute traumatic injury to the brain. 2.  Cerebral atrophy with extensive chronic microvascular ischemic changes in the white matter, as above, similar to prior study from 01/12/2011. 3.  Extensive paranasal sinus disease, as above, including air fluid levels in the sphenoid sinuses which could suggest acute sinusitis.  Clinical correlation is recommended. 4.  Small left mastoid effusion.  CT CERVICAL SPINE  Findings: No acute displaced cervical spine fractures are identified.  There is severe multilevel degenerative disc disease, most pronounced at C3-C4, C4-C5 and C5-C6.  Multilevel facet arthropathy is also noted.  This results in mild straightening of normal cervical lordosis. Additionally, there is 2 mm of anterolisthesis of C6 upon C7, which is favored to be chronic. Alignment is otherwise anatomic. Prevertebral soft tissues are normal.  Visualized portions of the upper thorax are remarkable for extensive peribronchovascular micronodularity in the lung apices, likely to reflect areas of mucoid impaction within terminal bronchioles.  IMPRESSION: 1.  No evidence of significant acute traumatic injury to the cervical spine. 2.  Severe multilevel degenerative disc disease and cervical spondylosis, as above. 3.  Extensive peribronchovascular micronodularity in the lung apices.  The appearance is suggestive of mucoid impaction within terminal bronchioles.  This is favored to represent an atypical infectious process such as Mycobacterium avium-intracellulare (MAI).  Original Report Authenticated By: Florencia Reasons, M.D.   Ct Cervical Spine Wo Contrast  04/29/2012  *RADIOLOGY REPORT*  Clinical Data:  History of trauma from a fall.  Lacerations to the lip.  CT HEAD WITHOUT CONTRAST CT CERVICAL  SPINE WITHOUT CONTRAST  Technique:  Multidetector CT imaging of the head and cervical spine was performed following the standard protocol without intravenous contrast.  Multiplanar CT image reconstructions of the cervical spine were also generated.  Comparison:  Head CT 01/12/2011.  CT HEAD  Findings: No acute displaced skull fractures are identified.  No acute intracranial abnormalities.  Specifically, no signs of acute post-traumatic intracranial hemorrhage, no definite area of acute/subacute cerebral ischemia, no focal mass, mass effect, hydrocephalus or abnormal intra or extra-axial fluid collections. There is cerebral and cerebellar atrophy which is age appropriate. In addition, there are extensive patchy and confluent areas of decreased attenuation throughout the deep and periventricular white matter of the cerebral hemispheres bilaterally, most compatible with chronic microvascular ischemic disease.  Visualized paranasal sinuses and mastoids are remarkable for a postoperative changes of bilateral maxillary antrectomy, air fluid levels in the sphenoid sinuses, mucosal thickening throughout the ethmoid and maxillary sinuses bilaterally, and a small left mastoid effusion.  IMPRESSION: 1.  No acute displaced skull fractures or findings to suggest significant acute traumatic injury to the brain. 2.  Cerebral atrophy with extensive chronic microvascular ischemic changes in the white matter, as above, similar to prior study from 01/12/2011. 3.  Extensive paranasal sinus disease, as above, including air fluid levels in the sphenoid sinuses which could suggest acute sinusitis.  Clinical correlation is recommended. 4.  Small left mastoid effusion.  CT CERVICAL SPINE  Findings: No acute displaced cervical spine fractures are identified.  There is severe multilevel degenerative disc disease, most pronounced at C3-C4, C4-C5 and C5-C6.  Multilevel facet arthropathy is also noted.  This results in mild straightening of  normal cervical lordosis. Additionally, there is 2 mm of anterolisthesis of C6 upon C7, which is favored to be chronic. Alignment is otherwise anatomic. Prevertebral soft tissues are normal.  Visualized portions of the upper thorax are remarkable for extensive peribronchovascular micronodularity in the lung apices, likely to reflect areas of mucoid impaction within terminal bronchioles.  IMPRESSION: 1.  No evidence of significant acute traumatic injury to the cervical spine. 2.  Severe multilevel degenerative disc disease and cervical spondylosis, as above. 3.  Extensive peribronchovascular micronodularity in the lung apices.  The appearance is suggestive of mucoid impaction within terminal bronchioles.  This is favored to represent an atypical infectious process such as Mycobacterium avium-intracellulare (MAI).  Original Report Authenticated By: Florencia Reasons, M.D.   Ct Shoulder Right Wo Contrast  04/29/2012  *RADIOLOGY REPORT*  Clinical Data:  Fall.  Right shoulder pain.  CT RIGHT SHOULDER WITHOUT CONTRAST  Technique:  Multidetector CT imaging of the right shoulder was performed according to the standard protocol without intravenous contrast. Multiplanar CT image reconstructions were also generated.  Comparison:  Radiographs 04/29/2012.  Findings:  Multiple areas of mucoid impaction in the right lung are present, which correlates with the chest radiograph.  No displaced right-sided rib fractures.  Right sternoclavicular joint osteoarthritis.  The right scapula is intact.  There is a comminuted fracture of the right humeral metaphysis.  The fracture planes extend into the anatomic and surgical neck of the right humerus.  The articular surfaces intact. Mild to moderate right glenohumeral osteoarthritis is present.  The humeral head fracture fragments are nondisplaced.  There is a greater tuberosity fracture.  The lesser tuberosity and bicipital groove are intact. Comminuted proximal metaphyseal fracture is  present.  The shaft is displaced about one half shaft width posteriorly.  There is mild medial displacement of the shaft.  Expected hemorrhage and hemarthrosis around the shoulder. Cystic changes are present in the anterior humeral head, likely associated with rotator cuff pathology.  There is no rotator cuff muscular atrophy.  IMPRESSION: 1.  Comminuted three-part fracture of the right humerus.  There is no fracture of the lesser tuberosity.  No displacement of the greater tuberosity fragment from the articular surface. 2.  Transverse metaphyseal fracture is comminuted and mildly displaced. 3. Mild moderate glenohumeral osteoarthritis.  3-DIMENSIONAL CT IMAGE RENDERING AT CT SCANNER:  Technique:  3-dimensional CT images were rendered by post- processing of the original CT data at the CT scanner.  The 3- dimensional CT images were interpreted, and findings were reported in the accompanying complete CT report for this study.  Original Report Authenticated By:  Andreas Newport, M.D.   Ct 3d Recon At Scanner  04/29/2012  *RADIOLOGY REPORT*  Clinical Data:  Fall.  Right shoulder pain.  CT RIGHT SHOULDER WITHOUT CONTRAST  Technique:  Multidetector CT imaging of the right shoulder was performed according to the standard protocol without intravenous contrast. Multiplanar CT image reconstructions were also generated.  Comparison:  Radiographs 04/29/2012.  Findings:  Multiple areas of mucoid impaction in the right lung are present, which correlates with the chest radiograph.  No displaced right-sided rib fractures.  Right sternoclavicular joint osteoarthritis.  The right scapula is intact.  There is a comminuted fracture of the right humeral metaphysis.  The fracture planes extend into the anatomic and surgical neck of the right humerus.  The articular surfaces intact. Mild to moderate right glenohumeral osteoarthritis is present.  The humeral head fracture fragments are nondisplaced.  There is a greater tuberosity fracture.   The lesser tuberosity and bicipital groove are intact. Comminuted proximal metaphyseal fracture is present.  The shaft is displaced about one half shaft width posteriorly.  There is mild medial displacement of the shaft.  Expected hemorrhage and hemarthrosis around the shoulder. Cystic changes are present in the anterior humeral head, likely associated with rotator cuff pathology.  There is no rotator cuff muscular atrophy.  IMPRESSION: 1.  Comminuted three-part fracture of the right humerus.  There is no fracture of the lesser tuberosity.  No displacement of the greater tuberosity fragment from the articular surface. 2.  Transverse metaphyseal fracture is comminuted and mildly displaced. 3. Mild moderate glenohumeral osteoarthritis.  3-DIMENSIONAL CT IMAGE RENDERING AT CT SCANNER:  Technique:  3-dimensional CT images were rendered by post- processing of the original CT data at the CT scanner.  The 3- dimensional CT images were interpreted, and findings were reported in the accompanying complete CT report for this study.  Original Report Authenticated By: Andreas Newport, M.D.   Dg Knee Complete 4 Views Right  04/29/2012  *RADIOLOGY REPORT*  Clinical Data: Status post fall.  Knee pain.  RIGHT KNEE - COMPLETE 4+ VIEW  Comparison: None  Findings:  Lipohemarthrosis is identified within the suprapatellar joint space.  There is a fracture through the mid pole of the patella.  The fracture fragments are in near anatomic alignment.  No significant arthropathy.  No radiopaque foreign bodies or soft tissue calcifications.  IMPRESSION:  1.  Patellar fracture which appears nondisplaced. 2.  Lipohemarthrosis.  Original Report Authenticated By: Rosealee Albee, M.D.    Scheduled Meds:   . albuterol  2.5 mg Nebulization Q6H  . bisoprolol  5 mg Oral Daily  . calcium-vitamin D  1 tablet Oral BID  . Chlorhexidine Gluconate Cloth  6 each Topical Q0600  . ciclesonide  2 spray Each Nare Daily  . docusate sodium  100 mg  Oral BID  . enoxaparin (LOVENOX) injection  40 mg Subcutaneous Q24H  . fluticasone  1 puff Inhalation BID  . latanoprost  1 drop Both Eyes QHS  . multivitamin with minerals  1 tablet Oral Daily  . mupirocin ointment  1 application Nasal BID  . nisoldipine  17 mg Oral Daily  . pantoprazole  40 mg Oral Q1200  . predniSONE  10 mg Oral Q breakfast  . sertraline  25 mg Oral QHS   Continuous Infusions:   . sodium chloride 75 mL/hr at 05/01/12 1240      Time spent: 30 MINUTES    Daison Braxton  [facility] Pager [phone]. If 8PM-8AM, please contact night-coverage  at [email or web address], password [facility] 05/01/2012, 4:41 PM  LOS: 2 days

## 2012-05-01 NOTE — Evaluation (Signed)
Physical Therapy Evaluation Patient Details Name: [name] MRN: [id number] DOB: 10/02/1932 Today's Date: 05/01/2012 Time: 1400-1420 PT Time Calculation (min): 20 min  PT Assessment / Plan / Recommendation Clinical Impression  78 yo female admitted after sustaining fall at church event resulting in a non displaced fracture of nose, comminuted fracture R proximal humerus, and non-displaced frature of R patella. Pt lives alone and was independent prior to admissin. Do not feel  pt is safe to d/c home alone. Recommend ST rehab at SNF to imrpove functional mobiltiy and safety.     PT Assessment  Patient needs continued PT services    Follow Up Recommendations  Skilled nursing facility    Barriers to Discharge Decreased caregiver support      Equipment Recommendations  Defer to next venue    Recommendations for Other Services OT consult   Frequency Min 3X/week    Precautions / Restrictions Precautions Precautions: Fall Required Braces or Orthoses: Knee Immobilizer - Right;Other Brace/Splint Knee Immobilizer - Right: On at all times Other Brace/Splint: Sling R UE Restrictions Weight Bearing Restrictions: Yes RUE Weight Bearing: Non weight bearing RLE Weight Bearing: Weight bearing as tolerated Other Position/Activity Restrictions: R LE-WBAT with KI at all times   Pertinent Vitals/Pain       Mobility  Bed Mobility Bed Mobility: Supine to Sit Supine to Sit: 1: +2 Total assist;HOB elevated Supine to Sit: Patient Percentage: 50% Details for Bed Mobility Assistance: VCs safety, technique, hand placement. assist for trunk to upright and bil LEs off bed. Utilized bedpad to assist with scooting, positioning Transfers Transfers: Sit to Stand;Stand to Sit Sit to Stand: 3: Mod assist;With upper extremity assist;From bed;From elevated surface Stand to Sit: 3: Mod assist;With upper extremity assist;With armrests;To chair/3-in-1 Details for Transfer Assistance: VCs safety,  technique, hand placement. Assist to rise, stabilize, control descent.  Ambulation/Gait Ambulation/Gait Assistance: 1: +2 Total assist Ambulation/Gait: Patient Percentage: 70% Ambulation Distance (Feet): 25 Feet Assistive device: Hemi-walker Ambulation/Gait Assistance Details: VCs safety, technique, sequence. Assist to stabilize and maneuver with Rw. Fatigues easily. Increased pain with increased activity Gait Pattern: Step-to pattern;Decreased stride length;Decreased step length - right;Decreased step length - left;Antalgic    Exercises     PT Diagnosis: Difficulty walking;Abnormality of gait;Acute pain;Generalized weakness  PT Problem List: Decreased balance;Decreased mobility;Decreased activity tolerance;Decreased knowledge of use of DME;Pain PT Treatment Interventions: DME instruction;Gait training;Functional mobility training;Therapeutic activities;Therapeutic exercise;Balance training;Patient/family education   PT Goals Acute Rehab PT Goals PT Goal Formulation: With patient Time For Goal Achievement: 05/15/12 Potential to Achieve Goals: Good Pt will go Supine/Side to Sit: with mod assist PT Goal: Supine/Side to Sit - Progress: Goal set today Pt will go Sit to Supine/Side: with mod assist PT Goal: Sit to Supine/Side - Progress: Goal set today Pt will go Sit to Stand: with mod assist PT Goal: Sit to Stand - Progress: Goal set today Pt will Transfer Bed to Chair/Chair to Bed: with mod assist PT Transfer Goal: Bed to Chair/Chair to Bed - Progress: Goal set today Pt will Ambulate: 51 - 150 feet;with mod assist;with least restrictive assistive device PT Goal: Ambulate - Progress: Goal set today  Visit Information  Last PT Received On: 05/01/12 Assistance Needed: +2    Subjective Data  Subjective: "I'll think about it....(rehab)" Patient Stated Goal: Get better   Prior Functioning  Home Living Lives With: Alone Type of Home:  (condo) Home Access: Stairs to enter Entrance  Stairs-Number of Steps: 1+1 Entrance Stairs-Rails: None Home Layout: One level Bathroom  Shower/Tub: Tub/shower unit;Walk-in shower Bathroom Toilet: Standard Home Adaptive Equipment: Environmental consultant - standard Prior Function Level of Independence: Independent Able to Take Stairs?: Yes Communication Communication: No difficulties    Cognition  Overall Cognitive Status: Appears within functional limits for tasks assessed/performed Arousal/Alertness: Awake/alert Orientation Level: Appears intact for tasks assessed Behavior During Session: [facility] for tasks performed    Extremity/Trunk Assessment Right Lower Extremity Assessment RLE ROM/Strength/Tone: Unable to fully assess Left Lower Extremity Assessment LLE ROM/Strength/Tone: [facility] for tasks assessed Trunk Assessment Trunk Assessment: Normal   Balance Balance Balance Assessed: Yes Static Standing Balance Static Standing - Balance Support: Left upper extremity supported Static Standing - Level of Assistance: 4: Min assist  End of Session PT - End of Session Equipment Utilized During Treatment: Gait belt;Right knee immobilizer (L UE sling) Activity Tolerance: Patient limited by pain;Patient limited by fatigue Patient left: in chair;with call bell/phone within reach  GP     Rebeca Alert [facility] 05/01/2012, 3:50 PM [phone]

## 2012-05-01 NOTE — Plan of Care (Signed)
Problem: Phase II Progression Outcomes Goal: Obtain order to discontinue catheter if appropriate Outcome: Completed/Met Date Met:  05/01/12 No foley

## 2012-05-01 NOTE — Plan of Care (Signed)
Problem: Phase II Progression Outcomes Goal: IV changed to normal saline lock Outcome: Completed/Met Date Met:  05/01/12 IV fluids discontinued and IV changed to saline lock.

## 2012-05-02 DIAGNOSIS — K219 Gastro-esophageal reflux disease without esophagitis: Secondary | ICD-10-CM

## 2012-05-02 DIAGNOSIS — J471 Bronchiectasis with (acute) exacerbation: Secondary | ICD-10-CM

## 2012-05-02 DIAGNOSIS — M2550 Pain in unspecified joint: Secondary | ICD-10-CM

## 2012-05-02 DIAGNOSIS — J9610 Chronic respiratory failure, unspecified whether with hypoxia or hypercapnia: Secondary | ICD-10-CM

## 2012-05-02 MED ORDER — HYDROCODONE-ACETAMINOPHEN 5-325 MG PO TABS: 1.0000 | ORAL_TABLET | ORAL | Status: DC | PRN

## 2012-05-02 MED ORDER — POTASSIUM CHLORIDE CRYS ER 20 MEQ PO TBCR
40.0000 meq | EXTENDED_RELEASE_TABLET | Freq: Two times a day (BID) | ORAL | Status: DC
  Filled 2012-05-02 (×2): qty 2

## 2012-05-02 MED ORDER — DSS 100 MG PO CAPS: 100.0000 mg | ORAL_CAPSULE | Freq: Two times a day (BID) | ORAL | Status: DC

## 2012-05-02 NOTE — Discharge Summary (Signed)
Physician Discharge Summary  [name] ZOX:[id number] DOB: [birth year] DOA: 04/29/2012  PCP: [name], MD  Admit date: 04/29/2012 Discharge date: 05/02/2012  Recommendations for Outpatient Follow-up:  1. Please follow up potassium levels as patient has been hypokalemic as well as sodium level.  Check clinical status and make sure that patient follows up with Orthopaedic surgeon as well as Ortho's recommendations listed below.  Discharge Diagnoses:  Principal Problem:  *Fall due to stumbling Active Problems:  HYPOTHYROIDISM  HYPERTENSION, BENIGN  Bronchiectasis without acute exacerbation  GERD  Fracture of humerus, distal, right, closed  Patellar fracture  Nasal bones, closed fracture  Hypokalemia   Discharge Condition: Stable  Diet recommendation: Cardiac  Wt Readings from Last 3 Encounters:  04/29/12 51.256 kg (113 lb)  03/12/12 53.434 kg (117 lb 12.8 oz)  02/17/12 54.069 kg (119 lb 3.2 oz)    History of present illness:  From original HPI: 78 year old female with a history of bronchiectasis on home oxygen follows with Dr. [name] , cheerful TIA, hypothyroidism, history of GERD, hiatal hernia who came to the hospital after falling on a hard surface and landed on her face her right shoulder and the right knee. She felt very dizzy after the fall and describes that she stumbled on a [place] at the [place], and then landed on the floor. He denies any loss of consciousness. She started having heavy bleeding from her nose and her lower lip. In the ED she had imaging done which showed a nondisplaced fracture of the bridge of the nose, comminuted fracture off the right humerus with displacement and without humerus head dislocation and a nondisplaced fracture off of the right patella. Her nosebleed stopped in the ED and the lower lip laceration was sutured. Patient had 5/10 pain on arrival and after receiving some Vicodin she is currently pain free . Placed on an upper arm sling and a knee  immobilizer. She attempted to get up from the wheelchair in the ED but was very dizzy. Orthostasis checked in the ED was negative.  She denies any chest pain palpitations, shortness of breath, has some headache, but no dizziness, denies any nausea or vomiting, denies any abdominal pain bowel or urinary symptoms.   Hospital Course:   Fall due to stumbling  -Patient tripped on a cord and sustained nondisplaced fracture of the bridge of the nose, displaced comminuted right humeral fracture, and nondisplaced fracture of the right patella.  -She has a upper arm sling and right knee immobilizer in place.  -Pain control with when necessary Vicodin and low dose. -Ortho recommended the following:  Non-surgical care planned for both the shoulder and the knee. WBAT with knee immobilizer on all the time on the right knee. No ROM for the right knee.  Right shoulder should remain immobile for 4 weeks prior to starting any ROM. She does not need the sling in bed. Position the arm in the hugging position with the arm across her waist and a pillow propped under the elbow. Sling wear should be reserved for walking or transferring.  Will need outpatient ortho follow up in two weeks in my office. [facility] [phone]   Nasal bones, closed fracture  No bleeding currently. Head and CT cervical spine within normal limits  ENT surgeon Dr. Lazarus Salines was called from the ED who advised ice and elevation for nasal fracture. He wants to see pt in the office next week. Asked for pt to call office and ask for Amy, his nurse to get pt  an appointment.   HYPOTHYROIDISM  Continue with Synthroid   HYPERTENSION, BENIGN  Purposes stable continue home meds   Fever:  Resolved off of anbiotics  Leukocytosis  The due to acute stress and chronic prednisone   Chronic Bronchiectasis  On chronic home O2 and prednisone which will be continued, continue with nebs   GERD  Continue PPI   History of TIA  Resume plavix    DVT prophylaxis  Subcutaneous Lovenox     Diet  Cardiac  Code Status: full  Disposition Plan: PT recommends SNF. Patient has refused SNF and has set up 24/7 care/observation at home.  Will D/C with home health PT/OT and DME equipment as recommended by Physical therapy.  Consultants:  [facility] ( ortho) Follow up with Dr Ranell Patrick in 2 weeks  Follow up with Dr Lazarus Salines ( ENT) in 1 week after d/c  Procedures:  none Antibiotics:  None   Discharge Exam: Filed Vitals:   05/02/12 1400  BP: 118/68  Pulse: 75  Temp: 98 F (36.7 C)  Resp: 18   Filed Vitals:   05/01/12 2212 05/02/12 0626 05/02/12 0831 05/02/12 1400  BP: 143/61 156/60  118/68  Pulse: 89 76  75  Temp: 99.1 F (37.3 C) 98.7 F (37.1 C)  98 F (36.7 C)  TempSrc: Oral Oral  Oral  Resp: 18 16  18   Height:      Weight:      SpO2: 95% 97% 95% 90%    General: Pt in NAD, A and O x 3 Cardiovascular: RRR, No MRG Respiratory: CTA BL, no wheezes  Discharge Instructions  Discharge Orders    Future Orders Please Complete By Expires   Diet - low sodium heart healthy      Increase activity slowly      Discharge instructions      Comments:   Discharge home with home health PT/OT.  Patient will have 24/7 care and supervision at home.    Non-surgical care planned for both the shoulder and the knee. WBAT with knee immobilizer on all the time on the right knee.  No ROM for the right knee. Right shoulder should remain immobile for 4 weeks prior to starting any ROM.  She does not need the sling in bed. Position the arm in the hugging position with the arm across her waist and a pillow propped under the elbow.  Sling wear should be reserved for walking or transferring. Will need outpatient ortho follow up in two weeks in my office. [facility] [phone]  ENT surgeon Dr. Lazarus Salines was called from the ED who advised ice and elevation for nasal fracture. He wants to see pt in the office next week. Asked for pt to call  office and ask for Amy, his nurse to get pt an appointment.  Phone number is [phone]    Call MD for:  temperature >100.4      Call MD for:  redness, tenderness, or signs of infection (pain, swelling, redness, odor or green/yellow discharge around incision site)      Call MD for:  extreme fatigue        Medication List  As of 05/02/2012  2:18 PM   STOP taking these medications         lansoprazole 30 MG disintegrating tablet         TAKE these medications         albuterol (2.5 MG/3ML) 0.083% nebulizer solution   Commonly known as: PROVENTIL  ALPRAZolam 0.25 MG tablet   Commonly known as: XANAX   Take 0.25 mg by mouth every 6 (six) hours as needed.      beclomethasone 40 MCG/ACT inhaler   Commonly known as: QVAR   Inhale 2 puffs into the lungs 2 (two) times daily.      bisoprolol 5 MG tablet   Commonly known as: ZEBETA   Take 5 mg by mouth daily.      calcium-vitamin D 500-200 MG-UNIT per tablet   Commonly known as: OSCAL WITH D   Take 1 tablet by mouth 2 (two) times daily.      cetirizine 10 MG tablet   Commonly known as: ZYRTEC   Take 10 mg by mouth at bedtime.      ciclesonide 50 MCG/ACT nasal spray   Commonly known as: OMNARIS   Place 2 sprays into both nostrils daily. 0-2 sprays each nostril twice per day      clopidogrel 75 MG tablet   Commonly known as: PLAVIX   Take 75 mg by mouth daily.      DSS 100 MG Caps   Take 100 mg by mouth 2 (two) times daily.      famotidine 20 MG tablet   Commonly known as: PEPCID   Take 20 mg by mouth as needed.      HYDROcodone-acetaminophen 5-325 MG per tablet   Commonly known as: NORCO/VICODIN   Take 1-2 tablets by mouth every 4 (four) hours as needed.      latanoprost 0.005 % ophthalmic solution   Commonly known as: XALATAN   Place 1 drop into both eyes at bedtime.      levothyroxine 100 MCG tablet   Commonly known as: SYNTHROID, LEVOTHROID   Take 100 mcg by mouth daily.      methylcellulose oral powder    Take 1 packet by mouth daily. 1 tsp by mouth once daily      multivitamin capsule   Take 1 capsule by mouth daily.      nisoldipine 20 MG 24 hr tablet   Commonly known as: SULAR   Take 20 mg by mouth daily.      potassium chloride SA 20 MEQ tablet   Commonly known as: K-DUR,KLOR-CON   Take 20 mEq by mouth 2 (two) times daily.      predniSONE 10 MG tablet   Commonly known as: DELTASONE   Take 10 mg by mouth daily.      sertraline 25 MG tablet   Commonly known as: ZOLOFT   Take 25 mg by mouth as needed.           Follow-up Information    Follow up with NORRIS,STEVEN R, MD. Schedule an appointment as soon as possible for a visit in 2 weeks. [phone])    Contact information:   [facility] [address], [address] [place] [address] [phone]           The results of significant diagnostics from this hospitalization (including imaging, microbiology, ancillary and laboratory) are listed below for reference.    Significant Diagnostic Studies: Dg Nasal Bones  04/29/2012  *RADIOLOGY REPORT*  Clinical Data: History of fall with injury to the face.  Pain in the nose and nasal bleeding.  NASAL BONES - 3+ VIEW  Comparison: No priors.  Findings: Acute minimally displaced and minimally comminuted fracture of the nasal bones is noted. Overlying soft tissue swelling is present.  IMPRESSION: 1.  Acute minimally displaced minimally comminuted  fractures of the nasal bones.  Original Report Authenticated By: Florencia Reasons, M.D.   Dg Chest 2 View  05/01/2012  *RADIOLOGY REPORT*  Clinical Data: Cough.  Left lower chest pain.  Leukocytosis.  CHEST - 2 VIEW  Comparison: Chest x-ray dated 02/17/2012 and 09/21/2011  Findings: The patient has extensive chronic lung disease, more prominent on the right than the left and most prominent in the lung bases.  No acute pulmonary abnormality.  The heart size and pulmonary vascularity are normal.  No effusions.   Acute comminuted fracture of the proximal right humerus.  IMPRESSION: No acute pulmonary disease.  Extensive chronic lung disease, stable.  Original Report Authenticated By: Gwynn Burly, M.D.   Dg Shoulder Right  04/29/2012  *RADIOLOGY REPORT*  Clinical Data: History of fall complain of right-sided shoulder pain.  RIGHT SHOULDER - 2+ VIEW  Comparison: No priors.  Findings: There is an acute comminuted fracture of the right proximal humerus.  One portion of the fracture extends through the surgical neck, while another portion extends up to the greater tubercle.  The humeral head maintains a normal articulation with the glenoid.  There is approximate 1 cm of medial displacement of the humeral shaft.  Extensive reticulonodular changes noted in the visualized portions of the lungs, similar to prior examinations, likely representing chronic indolent atypical infectious process such as MAI (Mycobacterium avium-intracellulare).  IMPRESSION: 1.  Complex comminuted fracture of the right proximal humerus, as above.  The humeral head remains located, but there is medial displacement of the humeral shaft.  Original Report Authenticated By: Florencia Reasons, M.D.   Ct Head Wo Contrast  04/29/2012  *RADIOLOGY REPORT*  Clinical Data:  History of trauma from a fall.  Lacerations to the lip.  CT HEAD WITHOUT CONTRAST CT CERVICAL SPINE WITHOUT CONTRAST  Technique:  Multidetector CT imaging of the head and cervical spine was performed following the standard protocol without intravenous contrast.  Multiplanar CT image reconstructions of the cervical spine were also generated.  Comparison:  Head CT 01/12/2011.  CT HEAD  Findings: No acute displaced skull fractures are identified.  No acute intracranial abnormalities.  Specifically, no signs of acute post-traumatic intracranial hemorrhage, no definite area of acute/subacute cerebral ischemia, no focal mass, mass effect, hydrocephalus or abnormal intra or extra-axial fluid  collections. There is cerebral and cerebellar atrophy which is age appropriate. In addition, there are extensive patchy and confluent areas of decreased attenuation throughout the deep and periventricular white matter of the cerebral hemispheres bilaterally, most compatible with chronic microvascular ischemic disease.  Visualized paranasal sinuses and mastoids are remarkable for a postoperative changes of bilateral maxillary antrectomy, air fluid levels in the sphenoid sinuses, mucosal thickening throughout the ethmoid and maxillary sinuses bilaterally, and a small left mastoid effusion.  IMPRESSION: 1.  No acute displaced skull fractures or findings to suggest significant acute traumatic injury to the brain. 2.  Cerebral atrophy with extensive chronic microvascular ischemic changes in the white matter, as above, similar to prior study from 01/12/2011. 3.  Extensive paranasal sinus disease, as above, including air fluid levels in the sphenoid sinuses which could suggest acute sinusitis.  Clinical correlation is recommended. 4.  Small left mastoid effusion.  CT CERVICAL SPINE  Findings: No acute displaced cervical spine fractures are identified.  There is severe multilevel degenerative disc disease, most pronounced at C3-C4, C4-C5 and C5-C6.  Multilevel facet arthropathy is also noted.  This results in mild straightening of normal cervical lordosis. Additionally, there is 2  mm of anterolisthesis of C6 upon C7, which is favored to be chronic. Alignment is otherwise anatomic. Prevertebral soft tissues are normal.  Visualized portions of the upper thorax are remarkable for extensive peribronchovascular micronodularity in the lung apices, likely to reflect areas of mucoid impaction within terminal bronchioles.  IMPRESSION: 1.  No evidence of significant acute traumatic injury to the cervical spine. 2.  Severe multilevel degenerative disc disease and cervical spondylosis, as above. 3.  Extensive peribronchovascular  micronodularity in the lung apices.  The appearance is suggestive of mucoid impaction within terminal bronchioles.  This is favored to represent an atypical infectious process such as Mycobacterium avium-intracellulare (MAI).  Original Report Authenticated By: Florencia Reasons, M.D.   Ct Cervical Spine Wo Contrast  04/29/2012  *RADIOLOGY REPORT*  Clinical Data:  History of trauma from a fall.  Lacerations to the lip.  CT HEAD WITHOUT CONTRAST CT CERVICAL SPINE WITHOUT CONTRAST  Technique:  Multidetector CT imaging of the head and cervical spine was performed following the standard protocol without intravenous contrast.  Multiplanar CT image reconstructions of the cervical spine were also generated.  Comparison:  Head CT 01/12/2011.  CT HEAD  Findings: No acute displaced skull fractures are identified.  No acute intracranial abnormalities.  Specifically, no signs of acute post-traumatic intracranial hemorrhage, no definite area of acute/subacute cerebral ischemia, no focal mass, mass effect, hydrocephalus or abnormal intra or extra-axial fluid collections. There is cerebral and cerebellar atrophy which is age appropriate. In addition, there are extensive patchy and confluent areas of decreased attenuation throughout the deep and periventricular white matter of the cerebral hemispheres bilaterally, most compatible with chronic microvascular ischemic disease.  Visualized paranasal sinuses and mastoids are remarkable for a postoperative changes of bilateral maxillary antrectomy, air fluid levels in the sphenoid sinuses, mucosal thickening throughout the ethmoid and maxillary sinuses bilaterally, and a small left mastoid effusion.  IMPRESSION: 1.  No acute displaced skull fractures or findings to suggest significant acute traumatic injury to the brain. 2.  Cerebral atrophy with extensive chronic microvascular ischemic changes in the white matter, as above, similar to prior study from 01/12/2011. 3.  Extensive  paranasal sinus disease, as above, including air fluid levels in the sphenoid sinuses which could suggest acute sinusitis.  Clinical correlation is recommended. 4.  Small left mastoid effusion.  CT CERVICAL SPINE  Findings: No acute displaced cervical spine fractures are identified.  There is severe multilevel degenerative disc disease, most pronounced at C3-C4, C4-C5 and C5-C6.  Multilevel facet arthropathy is also noted.  This results in mild straightening of normal cervical lordosis. Additionally, there is 2 mm of anterolisthesis of C6 upon C7, which is favored to be chronic. Alignment is otherwise anatomic. Prevertebral soft tissues are normal.  Visualized portions of the upper thorax are remarkable for extensive peribronchovascular micronodularity in the lung apices, likely to reflect areas of mucoid impaction within terminal bronchioles.  IMPRESSION: 1.  No evidence of significant acute traumatic injury to the cervical spine. 2.  Severe multilevel degenerative disc disease and cervical spondylosis, as above. 3.  Extensive peribronchovascular micronodularity in the lung apices.  The appearance is suggestive of mucoid impaction within terminal bronchioles.  This is favored to represent an atypical infectious process such as Mycobacterium avium-intracellulare (MAI).  Original Report Authenticated By: Florencia Reasons, M.D.   Ct Shoulder Right Wo Contrast  04/29/2012  *RADIOLOGY REPORT*  Clinical Data:  Fall.  Right shoulder pain.  CT RIGHT SHOULDER WITHOUT CONTRAST  Technique:  Multidetector CT imaging  of the right shoulder was performed according to the standard protocol without intravenous contrast. Multiplanar CT image reconstructions were also generated.  Comparison:  Radiographs 04/29/2012.  Findings:  Multiple areas of mucoid impaction in the right lung are present, which correlates with the chest radiograph.  No displaced right-sided rib fractures.  Right sternoclavicular joint osteoarthritis.  The  right scapula is intact.  There is a comminuted fracture of the right humeral metaphysis.  The fracture planes extend into the anatomic and surgical neck of the right humerus.  The articular surfaces intact. Mild to moderate right glenohumeral osteoarthritis is present.  The humeral head fracture fragments are nondisplaced.  There is a greater tuberosity fracture.  The lesser tuberosity and bicipital groove are intact. Comminuted proximal metaphyseal fracture is present.  The shaft is displaced about one half shaft width posteriorly.  There is mild medial displacement of the shaft.  Expected hemorrhage and hemarthrosis around the shoulder. Cystic changes are present in the anterior humeral head, likely associated with rotator cuff pathology.  There is no rotator cuff muscular atrophy.  IMPRESSION: 1.  Comminuted three-part fracture of the right humerus.  There is no fracture of the lesser tuberosity.  No displacement of the greater tuberosity fragment from the articular surface. 2.  Transverse metaphyseal fracture is comminuted and mildly displaced. 3. Mild moderate glenohumeral osteoarthritis.  3-DIMENSIONAL CT IMAGE RENDERING AT CT SCANNER:  Technique:  3-dimensional CT images were rendered by post- processing of the original CT data at the CT scanner.  The 3- dimensional CT images were interpreted, and findings were reported in the accompanying complete CT report for this study.  Original Report Authenticated By: Andreas Newport, M.D.   Ct 3d Recon At Scanner  04/29/2012  *RADIOLOGY REPORT*  Clinical Data:  Fall.  Right shoulder pain.  CT RIGHT SHOULDER WITHOUT CONTRAST  Technique:  Multidetector CT imaging of the right shoulder was performed according to the standard protocol without intravenous contrast. Multiplanar CT image reconstructions were also generated.  Comparison:  Radiographs 04/29/2012.  Findings:  Multiple areas of mucoid impaction in the right lung are present, which correlates with the chest  radiograph.  No displaced right-sided rib fractures.  Right sternoclavicular joint osteoarthritis.  The right scapula is intact.  There is a comminuted fracture of the right humeral metaphysis.  The fracture planes extend into the anatomic and surgical neck of the right humerus.  The articular surfaces intact. Mild to moderate right glenohumeral osteoarthritis is present.  The humeral head fracture fragments are nondisplaced.  There is a greater tuberosity fracture.  The lesser tuberosity and bicipital groove are intact. Comminuted proximal metaphyseal fracture is present.  The shaft is displaced about one half shaft width posteriorly.  There is mild medial displacement of the shaft.  Expected hemorrhage and hemarthrosis around the shoulder. Cystic changes are present in the anterior humeral head, likely associated with rotator cuff pathology.  There is no rotator cuff muscular atrophy.  IMPRESSION: 1.  Comminuted three-part fracture of the right humerus.  There is no fracture of the lesser tuberosity.  No displacement of the greater tuberosity fragment from the articular surface. 2.  Transverse metaphyseal fracture is comminuted and mildly displaced. 3. Mild moderate glenohumeral osteoarthritis.  3-DIMENSIONAL CT IMAGE RENDERING AT CT SCANNER:  Technique:  3-dimensional CT images were rendered by post- processing of the original CT data at the CT scanner.  The 3- dimensional CT images were interpreted, and findings were reported in the accompanying complete CT report for this  study.  Original Report Authenticated By: Andreas Newport, M.D.   Dg Knee Complete 4 Views Right  04/29/2012  *RADIOLOGY REPORT*  Clinical Data: Status post fall.  Knee pain.  RIGHT KNEE - COMPLETE 4+ VIEW  Comparison: None  Findings:  Lipohemarthrosis is identified within the suprapatellar joint space.  There is a fracture through the mid pole of the patella.  The fracture fragments are in near anatomic alignment.  No significant  arthropathy.  No radiopaque foreign bodies or soft tissue calcifications.  IMPRESSION:  1.  Patellar fracture which appears nondisplaced. 2.  Lipohemarthrosis.  Original Report Authenticated By: Rosealee Albee, M.D.    Microbiology: Recent Results (from the past 240 hour(s))  SURGICAL PCR SCREEN     Status: Abnormal   Collection Time   04/30/12  4:31 AM      Component Value Range Status Comment   MRSA, PCR POSITIVE (*) NEGATIVE Final    Staphylococcus aureus POSITIVE (*) NEGATIVE Final      Labs: Basic Metabolic Panel:  Lab 04/30/12 8413 04/29/12 1337  NA 132* 134*  K 3.0* 3.8  CL 98 99  CO2 26 25  GLUCOSE 94 128*  BUN 10 10  CREATININE 0.70 0.71  CALCIUM 8.5 8.4  MG -- --  PHOS -- --   Liver Function Tests:  Lab 04/29/12 1337  AST 18  ALT 11  ALKPHOS 50  BILITOT 0.3  PROT 6.5  ALBUMIN 3.0*   No results found for this basename: LIPASE:5,AMYLASE:5 in the last 168 hours No results found for this basename: AMMONIA:5 in the last 168 hours CBC:  Lab 04/30/12 0424 04/29/12 1337  WBC 10.7* 16.2*  NEUTROABS -- 14.3*  HGB 10.6* 11.2*  HCT 33.1* 35.1*  MCV 83.0 83.0  PLT 291 316   Cardiac Enzymes: No results found for this basename: CKTOTAL:5,CKMB:5,CKMBINDEX:5,TROPONINI:5 in the last 168 hours BNP: BNP (last 3 results)  Basename 02/17/12 1011  PROBNP 50.0   CBG: No results found for this basename: GLUCAP:5 in the last 168 hours  Time coordinating discharge: 35 minutes  Signed:  Penny Pia  [facility] 05/02/2012, 2:18 PM

## 2012-05-02 NOTE — Progress Notes (Signed)
Physical Therapy Treatment Patient Details Name: [name] MRN: [id number] DOB: 1933-02-25 Today's Date: 05/02/2012 Time: 1010-1035 PT Time Calculation (min): 25 min  PT Assessment / Plan / Recommendation Comments on Treatment Session  Pt declining rec for SNF stating, "I am not going to a Nursing Home".  Pt states she will have 24/7 care at her home.  Pt lives in a one level Condo and has one large step to enter.  Pt states she will carry her cell phone with her at all times.  Pt states she has friends/neighbors and care givers to assist her with her daily needs and errands.    Follow Up Recommendations  Home health PT;  (Pt declining SNF)    Barriers to Discharge        Equipment Recommendations  1.  3 in 1 bedside commode 2.  Wheelchair (elevating leg rests) 3.  Left side Hemi Walker  4.  Home O2   Recommendations for Other Services    Frequency Min 3X/week   Plan Discharge plan remains appropriate    Precautions / Restrictions Precautions Precautions: Fall Precaution Comments: home O2 Required Braces or Orthoses: Knee Immobilizer - Right Knee Immobilizer - Right: On at all times Other Brace/Splint: R UE sling Restrictions Weight Bearing Restrictions: Yes RUE Weight Bearing: Non weight bearing RLE Weight Bearing: Weight bearing as tolerated  Pt instructed and aware of the above   Pertinent Vitals/Pain C/o 4/10 R upper arm pain, applied ICE C/o 2/10 R knee pain    Mobility  Bed Mobility Bed Mobility: Supine to Sit Supine to Sit: 1: +2 Total assist Supine to Sit: Patient Percentage: 50% Details for Bed Mobility Assistance: +2 assist due to NWB R UE and KI R LE  Transfers Transfers: Sit to Stand;Stand to Sit Sit to Stand: 4: Min assist;From bed Stand to Sit: 4: Min assist;To chair/3-in-1 Details for Transfer Assistance: increased time and VC's on safety with turns  Ambulation/Gait Ambulation/Gait Assistance: 1: +2 Total assist Ambulation/Gait: Patient  Percentage: 80% Ambulation Distance (Feet): 25 Feet Assistive device: Hemi-walker Ambulation/Gait Assistance Details: 25% VC's for safety with turns and increased time 2nd mild c/o dizzyness and some nausea on excersion Gait Pattern: Step-to pattern;Decreased stance time - right;Trunk flexed Gait velocity: decreased General Gait Details: unsteady    PT Goals                          progressing    Visit Information  Last PT Received On: 05/02/12 Assistance Needed: +2 (for safety and equipment(O2/chair))    Subjective Data  Subjective: I'm not going to a nursing home Patient Stated Goal: home   Cognition    good   Balance   fair  End of Session PT - End of Session Equipment Utilized During Treatment: Gait belt;Right knee immobilizer;Oxygen (2lts, R UE sling)   Will discuss change in D/C plans to LPT as pt is declining SNF recommendations  Felecia Shelling  PTA [facility]  Acute  Rehab Pager     [phone]   Feel pt should d/c to SNF, however note pt is refusing. Will need 24/7 care and HHPT in addition to all equipment recommended. Thanks. Rebeca Alert, PT  [phone]

## 2012-05-02 NOTE — Progress Notes (Signed)
Orthopedics Progress Note  Subjective: Pt resting comfortably in no acute distress  Pt wants to go home but question whether she is safe and stable to go home  Objective:  Filed Vitals:   05/02/12 0626  BP: 156/60  Pulse: 76  Temp: 98.7 F (37.1 C)  Resp: 16    General: Awake and alert  Musculoskeletal: right upper extremity in sling, nv intact distally Right knee with knee immobilizer intact, nv intact distally Neurovascularly intact  Lab Results  Component Value Date   WBC 10.7* 04/30/2012   HGB 10.6* 04/30/2012   HCT 33.1* 04/30/2012   MCV 83.0 04/30/2012   PLT 291 04/30/2012       Component Value Date/Time   NA 132* 04/30/2012 0424   K 3.0* 04/30/2012 0424   CL 98 04/30/2012 0424   CO2 26 04/30/2012 0424   GLUCOSE 94 04/30/2012 0424   BUN 10 04/30/2012 0424   CREATININE 0.70 04/30/2012 0424   CALCIUM 8.5 04/30/2012 0424   GFRNONAA 82* 04/30/2012 0424   GFRAA >90 04/30/2012 0424    Lab Results  Component Value Date   INR 1.04 01/12/2011   INR 1.1 07/02/2008   INR 1.2 06/13/2008    Assessment/Plan: Right proximal humerus and right patella fracture  D/c planning probably to snf even though pt wants to go home Pain control F/u with Dr. Ranell Patrick in 2 weeks  Almedia Balls. Ranell Patrick, MD 05/02/2012 7:50 AM

## 2012-05-02 NOTE — Progress Notes (Signed)
Patient suffers from fracture of right patella which impairs their ability to perform daily activities like ambulating, toileting, feeding, bathing, dressing and grooming in the home. A cane, walker or crutch will not resolve issues with performing activities of daily living. A wheelchair will allow patient to safely perform daily activities. Patients can safely propel the wheelchair in the home or has a caregiver who can provides assistance.  Per previous orders of Dr. Penny Pia  05-02-12 1540

## 2012-05-02 NOTE — Progress Notes (Signed)
CSW consulted to assist with d/c planning. Pt has declined SNF placement. She plans to return home with 24/7 support from " [facility] ". Pt states arrangements have been made. RNCM and MD aware of d/c plans. MD will see pt later this afternoon to d/c home.  Cori Razor LCSW [phone]

## 2012-05-04 ENCOUNTER — Other Ambulatory Visit (INDEPENDENT_AMBULATORY_CARE_PROVIDER_SITE_OTHER): Payer: Medicare Other

## 2012-05-04 ENCOUNTER — Ambulatory Visit (INDEPENDENT_AMBULATORY_CARE_PROVIDER_SITE_OTHER): Payer: Medicare Other | Admitting: Internal Medicine

## 2012-05-04 ENCOUNTER — Encounter: Payer: Self-pay | Admitting: Internal Medicine

## 2012-05-04 VITALS — BP 122/60 | HR 80 | Temp 98.3°F

## 2012-05-04 DIAGNOSIS — J479 Bronchiectasis, uncomplicated: Secondary | ICD-10-CM

## 2012-05-04 DIAGNOSIS — I10 Essential (primary) hypertension: Secondary | ICD-10-CM

## 2012-05-04 DIAGNOSIS — J9610 Chronic respiratory failure, unspecified whether with hypoxia or hypercapnia: Secondary | ICD-10-CM

## 2012-05-04 LAB — BASIC METABOLIC PANEL
BUN: 11 mg/dL (ref 6–23)
CO2: 27 mEq/L (ref 19–32)
Glucose, Bld: 122 mg/dL — ABNORMAL HIGH (ref 70–99)
Potassium: 4.6 mEq/L (ref 3.5–5.1)
Sodium: 126 mEq/L — ABNORMAL LOW (ref 135–145)

## 2012-05-04 LAB — CBC WITH DIFFERENTIAL/PLATELET
Eosinophils Absolute: 0 10*3/uL (ref 0.0–0.7)
Hemoglobin: 10.8 g/dL — ABNORMAL LOW (ref 12.0–15.0)
Lymphocytes Relative: 3.6 % — ABNORMAL LOW (ref 12.0–46.0)
MCHC: 32.1 g/dL (ref 30.0–36.0)
Neutro Abs: 13.8 10*3/uL — ABNORMAL HIGH (ref 1.4–7.7)
RDW: 16.3 % — ABNORMAL HIGH (ref 11.5–14.6)
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
## 2012-05-04 NOTE — Patient Instructions (Addendum)

## 2012-05-05 ENCOUNTER — Encounter: Payer: Self-pay | Admitting: Internal Medicine

## 2012-05-05 NOTE — Assessment & Plan Note (Signed)
-   Xolair attempted 6/04 -> 9/05  - Immunotherapy stopped 8/09  - Started xyflo 8/09 > 9/09 no benefit  - VEST 2008 no benefit  - Flutter valve helpful March 17, 2009  - Allergy profile April 12, 2010 >> IgE 30 unremarkable  - Dulera 200 added November 22, 2010 > could not tolerate -Sputum cx 08/29/2011 +ESCHERICHIA COLI mixed sensitivities > flare tx w/ augmentin (d/t sulfa all)  - Add perfomist 02/17/2012 > no benefit  Relatively well compensated on present rx    Each maintenance medication was reviewed in detail including most importantly the difference between maintenance and as needed and under what circumstances the prns are to be used. This was done in the context of a medication calendar review which provided the patient with a user-friendly unambiguous mechanism for medication administration and reconciliation and provides an action plan for all active problems. It is critical that this be shown to every doctor  for modification during the office visit if necessary so the patient can use it as a working document.

## 2012-05-05 NOTE — Assessment & Plan Note (Signed)
Patient Saturations on Room Air at Rest = 85%  12-19-11    - 2lpm at hs and with activity around the house, 3lpm pulsed on port concentrator when outside as of 02/17/2012     - 02/17/2012  Walked 3lpm pulsed  x 1 laps @ 185 ft each stopped due to sob and sat 89%  Adequate control on present rx, reviewed  

## 2012-05-07 NOTE — Progress Notes (Signed)
Quick Note:  Pt aware and ROV set for 05-17-12 with TP to reck labs. ______

## 2012-05-07 NOTE — Progress Notes (Signed)
Quick Note:  LMTCB ______ 

## 2012-05-17 ENCOUNTER — Other Ambulatory Visit (INDEPENDENT_AMBULATORY_CARE_PROVIDER_SITE_OTHER): Payer: Medicare Other

## 2012-05-17 ENCOUNTER — Encounter: Payer: Self-pay | Admitting: Adult Health

## 2012-05-17 ENCOUNTER — Ambulatory Visit (INDEPENDENT_AMBULATORY_CARE_PROVIDER_SITE_OTHER): Payer: Medicare Other | Admitting: Adult Health

## 2012-05-17 VITALS — BP 124/66 | HR 78 | Temp 97.7°F | Ht 62.0 in

## 2012-05-17 DIAGNOSIS — E871 Hypo-osmolality and hyponatremia: Secondary | ICD-10-CM

## 2012-05-17 DIAGNOSIS — J479 Bronchiectasis, uncomplicated: Secondary | ICD-10-CM

## 2012-05-17 LAB — BASIC METABOLIC PANEL
BUN: 7 mg/dL (ref 6–23)
CO2: 28 mEq/L (ref 19–32)
Calcium: 8.6 mg/dL (ref 8.4–10.5)
Chloride: 91 mEq/L — ABNORMAL LOW (ref 96–112)
Creatinine, Ser: 0.7 mg/dL (ref 0.4–1.2)

## 2012-05-17 NOTE — Patient Instructions (Addendum)
Continue on current regimen.  follow up Dr. [name]  iin 4 weeks and As needed

## 2012-05-18 NOTE — Progress Notes (Signed)
Quick Note:  Called, spoke with pt. I informed her of lab results and recs per TP. She verbalized understanding of this and voiced no further questions/concerns at this time.   ** BMET needed added to appt notes for Sept 19 with Dr. [name]. ______

## 2012-05-22 DIAGNOSIS — E871 Hypo-osmolality and hyponatremia: Secondary | ICD-10-CM | POA: Insufficient documentation

## 2012-05-22 NOTE — Assessment & Plan Note (Signed)
Na+ remains unchanged at 126 She is asymptomatic , advised to avoid excessive fluid intake Cont to montior  follow up Dr. [name]  In 3-4 weeks with repeat bmet

## 2012-05-22 NOTE — Assessment & Plan Note (Signed)
Compensated on present regimen  No changes  
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
## 2012-06-05 ENCOUNTER — Other Ambulatory Visit: Payer: Self-pay | Admitting: Internal Medicine

## 2012-06-14 ENCOUNTER — Ambulatory Visit: Payer: Medicare Other | Admitting: Internal Medicine

## 2012-06-27 ENCOUNTER — Ambulatory Visit (INDEPENDENT_AMBULATORY_CARE_PROVIDER_SITE_OTHER): Payer: Medicare Other | Admitting: Internal Medicine

## 2012-06-27 ENCOUNTER — Encounter: Payer: Self-pay | Admitting: Internal Medicine

## 2012-06-27 VITALS — BP 120/78 | HR 77 | Temp 98.2°F | Ht 62.0 in | Wt 111.6 lb

## 2012-06-27 DIAGNOSIS — J310 Chronic rhinitis: Secondary | ICD-10-CM

## 2012-06-27 DIAGNOSIS — J479 Bronchiectasis, uncomplicated: Secondary | ICD-10-CM

## 2012-06-27 DIAGNOSIS — J9610 Chronic respiratory failure, unspecified whether with hypoxia or hypercapnia: Secondary | ICD-10-CM

## 2012-06-27 DIAGNOSIS — E871 Hypo-osmolality and hyponatremia: Secondary | ICD-10-CM

## 2012-06-27 MED ORDER — AZELASTINE-FLUTICASONE 137-50 MCG/ACT NA SUSP: 1.0000 | Freq: Two times a day (BID) | NASAL | Status: DC

## 2012-06-27 NOTE — Assessment & Plan Note (Addendum)
Poor control > Stop allegra Start Dymista one puff twice daily as per calendar For drippy nose, itching, sneezing > chlortrimeton 4 mg 1 every 4 hours as needed

## 2012-06-27 NOTE — Assessment & Plan Note (Signed)
Patient Saturations on Room Air at Rest = 85%  12-19-11    - 2lpm at hs and with activity around the house, 3lpm pulsed on port concentrator when outside as of 02/17/2012     - 02/17/2012  Walked 3lpm pulsed  x 1 laps @ 185 ft each stopped due to sob and sat 89%  Adequate control on present rx, reviewed

## 2012-06-27 NOTE — Patient Instructions (Addendum)
Stop allegra Start Dymista one puff twice daily as per calendar For drippy nose, itching, sneezing > chlortrimeton 4 mg 1 every 4 hours as needed  See calendar for specific medication instructions and bring it back for each and every office visit for every healthcare provider you see.  Without it,  you may not receive the best quality medical care that we feel you deserve.  You will note that the calendar groups together  your maintenance  medications that are timed at particular times of the day.  Think of this as your checklist for what your doctor has instructed you to do until your next evaluation to see what benefit  there is  to staying on a consistent group of medications intended to keep you well.  The other group at the bottom is entirely up to you to use as you see fit  for specific symptoms that may arise between visits that require you to treat them on an as needed basis.  Think of this as your action plan or "what if" list.   Separating the top medications from the bottom group is fundamental to providing you adequate care going forward.    Please schedule a follow up office visit in 6 weeks, call sooner if needed to see Tammy with all medications

## 2012-06-27 NOTE — Assessment & Plan Note (Signed)
Asymptomatic, probably just a "reset osmostat" or mild siadh from lung dz/ bronchiectasis

## 2012-06-27 NOTE — Assessment & Plan Note (Signed)
-   Xolair attempted 6/04 -> 9/05  - Immunotherapy stopped 8/09  - Started xyflo 8/09 > 9/09 no benefit  - VEST 2008 no benefit  - Flutter valve helpful March 17, 2009  - Allergy profile April 12, 2010 >> IgE 30 unremarkable  - Dulera 200 added November 22, 2010 > could not tolerate -Sputum cx 08/29/2011 +ESCHERICHIA COLI mixed sensitivities > flare tx w/ augmentin (d/t sulfa all)  - Add perfomist 02/17/2012 > no benefit  Adequate control on present rx, reviewed Each maintenance medication  in detail including most importantly the difference between maintenance and as needed and under what circumstances the prns are to be used. This was done in the context of a medication calendar review which provided the patient with a user-friendly unambiguous mechanism for medication administration and reconciliation and provides an action plan for all active problems. It is critical that this be shown to every doctor  for modification during the office visit if necessary so the patient can use it as a working document.
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
## 2012-07-03 ENCOUNTER — Other Ambulatory Visit: Payer: Self-pay | Admitting: Adult Health

## 2012-07-03 ENCOUNTER — Other Ambulatory Visit: Payer: Self-pay | Admitting: Internal Medicine

## 2012-07-06 ENCOUNTER — Other Ambulatory Visit: Payer: Self-pay | Admitting: Dermatology

## 2012-07-16 ENCOUNTER — Ambulatory Visit (INDEPENDENT_AMBULATORY_CARE_PROVIDER_SITE_OTHER): Payer: Medicare Other

## 2012-07-16 DIAGNOSIS — Z23 Encounter for immunization: Secondary | ICD-10-CM

## 2012-07-18 DIAGNOSIS — Z23 Encounter for immunization: Secondary | ICD-10-CM

## 2012-08-03 ENCOUNTER — Ambulatory Visit (INDEPENDENT_AMBULATORY_CARE_PROVIDER_SITE_OTHER): Payer: Medicare Other | Admitting: Internal Medicine

## 2012-08-03 ENCOUNTER — Telehealth: Payer: Self-pay | Admitting: Internal Medicine

## 2012-08-03 ENCOUNTER — Other Ambulatory Visit (INDEPENDENT_AMBULATORY_CARE_PROVIDER_SITE_OTHER): Payer: Medicare Other

## 2012-08-03 ENCOUNTER — Encounter: Payer: Self-pay | Admitting: Internal Medicine

## 2012-08-03 VITALS — BP 100/60 | HR 77 | Temp 96.8°F | Ht 62.0 in | Wt 105.4 lb

## 2012-08-03 DIAGNOSIS — E039 Hypothyroidism, unspecified: Secondary | ICD-10-CM

## 2012-08-03 DIAGNOSIS — I10 Essential (primary) hypertension: Secondary | ICD-10-CM

## 2012-08-03 DIAGNOSIS — R079 Chest pain, unspecified: Secondary | ICD-10-CM

## 2012-08-03 DIAGNOSIS — J310 Chronic rhinitis: Secondary | ICD-10-CM

## 2012-08-03 DIAGNOSIS — J479 Bronchiectasis, uncomplicated: Secondary | ICD-10-CM

## 2012-08-03 LAB — CBC WITH DIFFERENTIAL/PLATELET
Basophils Absolute: 0 10*3/uL (ref 0.0–0.1)
Basophils Relative: 0.2 % (ref 0.0–3.0)
Eosinophils Absolute: 0 10*3/uL (ref 0.0–0.7)
MCHC: 31.4 g/dL (ref 30.0–36.0)
MCV: 78.8 fl (ref 78.0–100.0)
Monocytes Absolute: 0.8 10*3/uL (ref 0.1–1.0)
Neutrophils Relative %: 90.6 % — ABNORMAL HIGH (ref 43.0–77.0)
Platelets: 424 10*3/uL — ABNORMAL HIGH (ref 150.0–400.0)
RDW: 16.7 % — ABNORMAL HIGH (ref 11.5–14.6)

## 2012-08-03 LAB — BASIC METABOLIC PANEL
BUN: 18 mg/dL (ref 6–23)
Calcium: 8.8 mg/dL (ref 8.4–10.5)
Creatinine, Ser: 1 mg/dL (ref 0.4–1.2)
GFR: 54.62 mL/min — ABNORMAL LOW (ref >60.00)
Glucose, Bld: 109 mg/dL — ABNORMAL HIGH (ref 70–99)

## 2012-08-03 LAB — TSH: TSH: 4.49 u[IU]/mL (ref 0.35–5.50)
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
## 2012-08-03 NOTE — Telephone Encounter (Signed)
Will forward to [place] to have once labs are back to know where to call patient.

## 2012-08-03 NOTE — Patient Instructions (Signed)
Stop sular until you see Tammy for your med calendar  Please remember to go to the lab   department downstairs for your tests - we will call you with the results when they are available.

## 2012-08-04 ENCOUNTER — Encounter: Payer: Self-pay | Admitting: Internal Medicine

## 2012-08-04 NOTE — Assessment & Plan Note (Signed)
Lab Results  Component Value Date   TSH 4.49 08/03/2012    Trending up so will need to consider increasing to 125 mcg next refill

## 2012-08-04 NOTE — Assessment & Plan Note (Signed)
-   Xolair attempted 6/04 -> 9/05  - Immunotherapy stopped 8/09  - Started xyflo 8/09 > 9/09 no benefit  - VEST 2008 no benefit  - Flutter valve helpful March 17, 2009  - Allergy profile April 12, 2010 >> IgE 30 unremarkable  - Dulera 200 added November 22, 2010 > could not tolerate -Sputum cx 08/29/2011 +ESCHERICHIA COLI mixed sensitivities > flare tx w/ augmentin (d/t sulfa all)   -perfomist 02/17/2012 > no benefit  Adequate control on present rx, reviewed

## 2012-08-04 NOTE — Assessment & Plan Note (Signed)
Adequate control on present rx, reviewed     Each maintenance medication was reviewed in detail including most importantly the difference between maintenance and as needed and under what circumstances the prns are to be used. This was done in the context of a medication calendar review which provided the patient with a user-friendly unambiguous mechanism for medication administration and reconciliation and provides an action plan for all active problems. It is critical that this be shown to every doctor  for modification during the office visit if necessary so the patient can use it as a working document.     

## 2012-08-04 NOTE — Assessment & Plan Note (Signed)
overtreated at present with presyncope earlier today > d/c sular and monitor

## 2012-08-08 ENCOUNTER — Other Ambulatory Visit: Payer: Self-pay | Admitting: Internal Medicine

## 2012-08-08 MED ORDER — LEVOTHYROXINE SODIUM 112 MCG PO TABS: 112.0000 ug | ORAL_TABLET | Freq: Every day | ORAL | Status: DC

## 2012-08-08 NOTE — Progress Notes (Signed)
Quick Note:  Spoke with pt and notified of results per Dr. [name]. Pt verbalized understanding and denied any questions. Per MW call in levothyroxine 112 mcg. Rx was sent to pharm. ______

## 2012-08-09 ENCOUNTER — Encounter: Payer: Self-pay | Admitting: Adult Health

## 2012-08-09 ENCOUNTER — Ambulatory Visit (INDEPENDENT_AMBULATORY_CARE_PROVIDER_SITE_OTHER): Payer: Medicare Other | Admitting: Adult Health

## 2012-08-09 VITALS — BP 104/64 | HR 71 | Temp 97.1°F | Ht 62.0 in | Wt 106.7 lb

## 2012-08-09 DIAGNOSIS — D72829 Elevated white blood cell count, unspecified: Secondary | ICD-10-CM

## 2012-08-09 DIAGNOSIS — I10 Essential (primary) hypertension: Secondary | ICD-10-CM

## 2012-08-09 DIAGNOSIS — J471 Bronchiectasis with (acute) exacerbation: Secondary | ICD-10-CM

## 2012-08-09 NOTE — Assessment & Plan Note (Signed)
WBC up and down on the years felt secondary to chronic steroid use and recurrent resp infections Slow tr up this year,  Will repeat on return , consider further evaluation /referral to hematology if tr up.
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
## 2012-08-09 NOTE — Patient Instructions (Addendum)
Augmentin 875mg  Twice daily  For 10 days -take w/ food  Mucinex  Twice daily  As needed  Cough/congestion  Along w/ flutter valve   follow up Dr. [name]  In 1 month with CBC w/ diff  Follow med calendar closely and bring to each visit.   Fluids and rest  Please contact office for sooner follow up if symptoms do not improve or worsen or seek emergency care

## 2012-08-09 NOTE — Assessment & Plan Note (Signed)
B/p compensated  No further presyncopal episodes off sular.  Patient's medications were reviewed today and patient education was given. Computerized medication calendar was adjusted/completed

## 2012-08-09 NOTE — Addendum Note (Signed)
Addended by: Boone Master E on: 08/09/2012 05:18 PM   Modules accepted: Orders

## 2012-08-09 NOTE — Assessment & Plan Note (Signed)
Mild flare with sinusitis   Plan Augmentin 875mg  Twice daily  For 10 days -take w/ food  Mucinex  Twice daily  As needed  Cough/congestion  Along w/ flutter valve   follow up Dr. [name]  In 1 month with CBC w/ diff  Follow med calendar closely and bring to each visit.   Fluids and rest  Please contact office for sooner follow up if symptoms do not improve or worsen or seek emergency care

## 2012-08-28 ENCOUNTER — Telehealth: Payer: Self-pay | Admitting: Internal Medicine

## 2012-08-28 NOTE — Telephone Encounter (Signed)
Spoke to pt about her BP being high. She had it checked at [facility] yesterday and today, it was 160/84 on 08/27/12 and 154/80 today. At her last appointment with Dr. [name], he discontinued Sular due to her BP being low. Pt is wondering if she needs to go back on a BP medication.  Please advise. Thanks.

## 2012-08-29 MED ORDER — NISOLDIPINE ER 20 MG PO TB24: ORAL_TABLET | ORAL | Status: DC

## 2012-08-29 NOTE — Telephone Encounter (Signed)
Pt is aware and rx sent. Carron Curie, CMA

## 2012-08-29 NOTE — Telephone Encounter (Signed)
Would resume one half pill of sular daily - if can't break it in half ok to rx with half the strength if available, if not go to amlodipine 5 mg daily

## 2012-09-07 ENCOUNTER — Ambulatory Visit: Payer: Medicare Other | Admitting: Internal Medicine

## 2012-09-10 ENCOUNTER — Telehealth: Payer: Self-pay | Admitting: Internal Medicine

## 2012-09-10 ENCOUNTER — Ambulatory Visit (INDEPENDENT_AMBULATORY_CARE_PROVIDER_SITE_OTHER): Payer: Medicare Other | Admitting: Internal Medicine

## 2012-09-10 ENCOUNTER — Encounter: Payer: Self-pay | Admitting: Internal Medicine

## 2012-09-10 VITALS — BP 116/60 | HR 88 | Temp 98.1°F | Ht 62.0 in | Wt 105.0 lb

## 2012-09-10 DIAGNOSIS — J479 Bronchiectasis, uncomplicated: Secondary | ICD-10-CM

## 2012-09-10 DIAGNOSIS — J310 Chronic rhinitis: Secondary | ICD-10-CM

## 2012-09-10 DIAGNOSIS — I10 Essential (primary) hypertension: Secondary | ICD-10-CM

## 2012-09-10 DIAGNOSIS — E039 Hypothyroidism, unspecified: Secondary | ICD-10-CM

## 2012-09-10 NOTE — Assessment & Plan Note (Signed)
Back on omnaris/afrin> will inquire on next ov why she stopped the dymista

## 2012-09-10 NOTE — Assessment & Plan Note (Signed)
-   Xolair attempted 6/04 -> 9/05  - Immunotherapy stopped 8/09  - Started xyflo 8/09 > 9/09 no benefit  - VEST 2008 no benefit  - Flutter valve helpful March 17, 2009  - Allergy profile April 12, 2010 >> IgE 30 unremarkable  - Dulera 200 added November 22, 2010 > could not tolerate -Sputum cx 08/29/2011 +ESCHERICHIA COLI mixed sensitivities > flare tx w/ augmentin (d/t sulfa all)   -perfomist 02/17/2012 > no benefit  Despite severity this problem is relatively well compensated abeit on an extremely complex regimen.    Each maintenance medication was reviewed in detail including most importantly the difference between maintenance and as needed and under what circumstances the prns are to be used. This was done in the context of a medication calendar review which provided the patient with a user-friendly unambiguous mechanism for medication administration and reconciliation and provides an action plan for all active problems. It is critical that this be shown to every doctor  for modification during the office visit if necessary so the patient can use it as a working document.

## 2012-09-10 NOTE — Telephone Encounter (Signed)
Spoke with pt She states that we have a copy of her living will in her file form yrs ago and is now needing a copy of this She is at [facility] now and wants [place] to fax this to her Nurse Coralyn Mark I advised will order her paper chart and will call her once we find it She will then have the fax number for [place] to send this to Will await chart

## 2012-09-10 NOTE — Assessment & Plan Note (Signed)
Adequate control on present rx, reviewed need to take sular 20 mg one half daily
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
## 2012-09-10 NOTE — Assessment & Plan Note (Signed)
Adequate control on present rx, reviewed > recheck tsh next ov

## 2012-09-10 NOTE — Patient Instructions (Addendum)
See calendar for specific medication instructions and bring it back for each and every office visit for every healthcare provider you see.  Without it,  you may not receive the best quality medical care that we feel you deserve.  You will note that the calendar groups together  your maintenance  medications that are timed at particular times of the day.  Think of this as your checklist for what your doctor has instructed you to do until your next evaluation to see what benefit  there is  to staying on a consistent group of medications intended to keep you well.  The other group at the bottom is entirely up to you to use as you see fit  for specific symptoms that may arise between visits that require you to treat them on an as needed basis.  Think of this as your action plan or "what if" list.   Separating the top medications from the bottom group is fundamental to providing you adequate care going forward.    Please schedule a follow up visit in 3 months but call sooner if needed   Late add needs recheck tsh and Back on omnaris/afrin> will inquire on next ov why she stopped the dymista

## 2012-09-11 NOTE — Telephone Encounter (Signed)
Spoke with pt and notified that declaration for a natural death was was in her paper chart.  I faxed this to [facility] per pt's request.

## 2012-09-15 ENCOUNTER — Other Ambulatory Visit: Payer: Self-pay | Admitting: Internal Medicine

## 2012-09-18 ENCOUNTER — Other Ambulatory Visit: Payer: Self-pay | Admitting: *Deleted

## 2012-09-18 MED ORDER — ALBUTEROL SULFATE (2.5 MG/3ML) 0.083% IN NEBU: 2.5000 mg | INHALATION_SOLUTION | Freq: Four times a day (QID) | RESPIRATORY_TRACT | Status: DC | PRN

## 2012-09-20 ENCOUNTER — Telehealth: Payer: Self-pay | Admitting: Internal Medicine

## 2012-09-20 NOTE — Telephone Encounter (Signed)
Spoke w [facility]--patient is to take Sular 20mg  take 1/2 tablet daily.  Pharmacy rep states this pill is not to be chewed or crushed and does not come in 10mg  tablets.  Dr. [name] please advise, thank you

## 2012-09-20 NOTE — Telephone Encounter (Signed)
Spoke with [facility](Scott) he understands that MW stated that it was okay to still but in half-Scott agreed that the tablet patient is taking should be fine to cut in half as well. Will keep med and sig the same.

## 2012-09-20 NOTE — Telephone Encounter (Signed)
Still ok to break in half - alternative is amlodipine 5 mg daily

## 2012-09-25 ENCOUNTER — Other Ambulatory Visit: Payer: Self-pay | Admitting: Internal Medicine

## 2012-09-25 MED ORDER — NISOLDIPINE ER 8.5 MG PO TB24: 8.5000 mg | ORAL_TABLET | Freq: Every day | ORAL | Status: DC

## 2012-09-25 NOTE — Telephone Encounter (Signed)
Okay per Dr Imagene Riches for refills/changes of Nisoldipine ER 20mg  Take 1/2 tab (10mg ) daily to 8.5 ER Take1 tablet daily.  [facility] to [facility] -- Nisoldipine 8.5mg  ER #30 Take tablet daily x 6 refills.  Patient needs to be made aware that this change has been made and med is at pharm. I called the patient, no answer.

## 2012-10-23 ENCOUNTER — Other Ambulatory Visit: Payer: Self-pay | Admitting: Adult Health

## 2012-11-16 ENCOUNTER — Other Ambulatory Visit: Payer: Self-pay | Admitting: Dermatology

## 2012-11-22 ENCOUNTER — Other Ambulatory Visit: Payer: Self-pay | Admitting: Internal Medicine

## 2012-11-23 NOTE — Telephone Encounter (Signed)
Please advise if okay to refill thanks 

## 2012-11-25 ENCOUNTER — Other Ambulatory Visit: Payer: Self-pay | Admitting: Internal Medicine

## 2012-12-11 ENCOUNTER — Ambulatory Visit: Payer: Medicare Other | Admitting: Internal Medicine

## 2012-12-18 ENCOUNTER — Other Ambulatory Visit: Payer: Self-pay | Admitting: Internal Medicine

## 2012-12-19 ENCOUNTER — Telehealth: Payer: Self-pay | Admitting: Internal Medicine

## 2012-12-19 MED ORDER — POTASSIUM CHLORIDE CRYS ER 20 MEQ PO TBCR: 20.0000 meq | EXTENDED_RELEASE_TABLET | Freq: Two times a day (BID) | ORAL | Status: DC

## 2012-12-19 NOTE — Telephone Encounter (Signed)
Pt aware that I have sent Rx to her pharmacy.

## 2012-12-25 ENCOUNTER — Ambulatory Visit (INDEPENDENT_AMBULATORY_CARE_PROVIDER_SITE_OTHER): Payer: Medicare Other | Admitting: Internal Medicine

## 2012-12-25 ENCOUNTER — Other Ambulatory Visit (INDEPENDENT_AMBULATORY_CARE_PROVIDER_SITE_OTHER): Payer: Medicare Other

## 2012-12-25 ENCOUNTER — Encounter: Payer: Self-pay | Admitting: Internal Medicine

## 2012-12-25 VITALS — BP 138/70 | HR 80 | Temp 97.5°F | Ht 62.0 in | Wt 108.0 lb

## 2012-12-25 DIAGNOSIS — E039 Hypothyroidism, unspecified: Secondary | ICD-10-CM

## 2012-12-25 DIAGNOSIS — J479 Bronchiectasis, uncomplicated: Secondary | ICD-10-CM

## 2012-12-25 DIAGNOSIS — J9610 Chronic respiratory failure, unspecified whether with hypoxia or hypercapnia: Secondary | ICD-10-CM

## 2012-12-25 MED ORDER — FORMOTEROL FUMARATE 20 MCG/2ML IN NEBU: 20.0000 ug | INHALATION_SOLUTION | Freq: Two times a day (BID) | RESPIRATORY_TRACT | Status: DC
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
## 2012-12-25 NOTE — Patient Instructions (Addendum)
Perforomist 20 mcg twice daily in nebulizer  Only use your albuterol as a rescue medication to be used if you can't catch your breath by resting or doing a relaxed purse lip breathing pattern. The less you use it, the better it will work when you need it.   Please remember to go to the lab   department downstairs for your tests - we will call you with the results when they are available.      See [name] w/in 3 months with all your medications, even over the counter meds, separated in two separate bags, the ones you take no matter what vs the ones you stop once you feel better and take only as needed when you feel you need them.   Tammy  will generate for you a new user friendly medication calendar that will put [place] all on the same page re: your medication use.     Without this process, it simply isn't possible to assure that we are providing  your outpatient care  with  the attention to detail we feel you deserve.   If we cannot assure that you're getting that kind of care,  then we cannot manage your problem effectively from this clinic.  Once you have seen Tammy and we are sure that we're all on the same page with your medication use she will arrange follow up with me.

## 2012-12-26 ENCOUNTER — Other Ambulatory Visit: Payer: Self-pay | Admitting: Internal Medicine

## 2012-12-26 LAB — TSH: TSH: 0.12 u[IU]/mL — ABNORMAL LOW (ref 0.35–5.50)

## 2012-12-26 MED ORDER — LEVOTHYROXINE SODIUM 88 MCG PO TABS: 88.0000 ug | ORAL_TABLET | Freq: Every day | ORAL | Status: DC

## 2012-12-26 NOTE — Progress Notes (Signed)
Quick Note:  Spoke with pt and notified of results per Dr. [name]. Pt verbalized understanding and denied any questions.  ______ 

## 2012-12-27 NOTE — Assessment & Plan Note (Signed)
-   Xolair attempted 6/04 -> 05/2004  - Immunotherapy stopped 8/09  - Started xyflo 8/09 > 9/09 no benefit  - VEST 2008 no benefit  - Flutter valve helpful March 17, 2009  - Allergy profile April 12, 2010 >> IgE 30 unremarkable  - Dulera 200 added November 22, 2010 > could not tolerate -Sputum cx 08/29/2011 +ESCHERICHIA COLI mixed sensitivities > flare tx w/ augmentin (d/t sulfa all)   -perfomist 02/17/2012 > no benefit > rechallenge 12/27/2012  Based on freq of saba use increasing, will use saba need to determine whether any benefit    Each maintenance medication was reviewed in detail including most importantly the difference between maintenance and as needed and under what circumstances the prns are to be used. This was done in the context of a medication calendar review which provided the patient with a user-friendly unambiguous mechanism for medication administration and reconciliation and provides an action plan for all active problems. It is critical that this be shown to every doctor  for modification during the office visit if necessary so the patient can use it as a working document.

## 2012-12-27 NOTE — Assessment & Plan Note (Signed)
Patient Saturations on Room Air at Rest = 85%  12-19-11    -rx =  2lpm at hs and with activity within room,  3lpm pulsed on port concentrator when out of room at [facility] effective .12/25/2012      - 02/17/2012  Walked 3lpm pulsed  x 1 laps @ 185 ft each stopped due to sob and sat 89%  Adequate control on present rx, reviewed

## 2012-12-27 NOTE — Assessment & Plan Note (Addendum)
Lab Results  Component Value Date   TSH 0.12* 12/25/2012   dose is excessive, reduce by 25 mcg per day to 88 mcg daily  and recheck in 4-6 wks

## 2013-01-15 ENCOUNTER — Other Ambulatory Visit: Payer: Self-pay | Admitting: Internal Medicine

## 2013-01-16 NOTE — Telephone Encounter (Signed)
Please advise if okay to refill. Thanks.  

## 2013-02-12 ENCOUNTER — Other Ambulatory Visit: Payer: Self-pay | Admitting: Adult Health

## 2013-02-26 ENCOUNTER — Other Ambulatory Visit: Payer: Self-pay | Admitting: Internal Medicine

## 2013-03-28 ENCOUNTER — Encounter: Payer: Medicare Other | Admitting: Adult Health

## 2013-04-02 ENCOUNTER — Other Ambulatory Visit: Payer: Self-pay | Admitting: Internal Medicine

## 2013-04-05 ENCOUNTER — Encounter: Payer: Medicare Other | Admitting: Adult Health

## 2013-04-11 ENCOUNTER — Encounter: Payer: Self-pay | Admitting: Adult Health

## 2013-04-11 ENCOUNTER — Ambulatory Visit (INDEPENDENT_AMBULATORY_CARE_PROVIDER_SITE_OTHER): Payer: Medicare Other | Admitting: Adult Health

## 2013-04-11 VITALS — BP 138/82 | HR 85 | Temp 98.2°F | Ht 62.0 in | Wt 107.4 lb

## 2013-04-11 DIAGNOSIS — M810 Age-related osteoporosis without current pathological fracture: Secondary | ICD-10-CM

## 2013-04-11 DIAGNOSIS — J479 Bronchiectasis, uncomplicated: Secondary | ICD-10-CM
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
## 2013-04-11 NOTE — Assessment & Plan Note (Signed)
Patient's medications were reviewed today and patient education was given. Computerized medication calendar was adjusted/completed May do drug holiday if BMD is stable as she has been on therapy for several years.   Plan   We are setting you up for for a Bone Density .  Will decide if continue on Reclast after we get results or take a "drug holiday"  Follow med calendar closely and bring to each visit.  follow up Dr. [name]  In 2 months for physical - come fasting .

## 2013-04-11 NOTE — Patient Instructions (Addendum)
We are setting you up for for a Bone Density .  Will decide if continue on Reclast after we get results or take a "drug holiday"  Follow med calendar closely and bring to each visit.  follow up Dr. [name]  In 2 months for physical - come fasting .

## 2013-04-11 NOTE — Assessment & Plan Note (Signed)
Without flare   Plan  Cont on current regimen

## 2013-04-16 ENCOUNTER — Ambulatory Visit (INDEPENDENT_AMBULATORY_CARE_PROVIDER_SITE_OTHER)
Admission: RE | Admit: 2013-04-16 | Discharge: 2013-04-16 | Disposition: A | Discharge status: Home / Self Care | Payer: Medicare Other | Source: Ambulatory Visit | Attending: Internal Medicine | Admitting: Internal Medicine

## 2013-04-16 DIAGNOSIS — M810 Age-related osteoporosis without current pathological fracture: Secondary | ICD-10-CM

## 2013-04-22 NOTE — Addendum Note (Signed)
Addended by: Boone Master E on: 04/22/2013 12:18 PM   Modules accepted: Orders

## 2013-04-24 ENCOUNTER — Encounter: Payer: Self-pay | Admitting: Adult Health

## 2013-04-25 NOTE — Progress Notes (Signed)
Quick Note:  ATC pt x1 > line rang multiple times (>5) with no answer, no option to LM. WCB. ______

## 2013-04-26 ENCOUNTER — Other Ambulatory Visit (INDEPENDENT_AMBULATORY_CARE_PROVIDER_SITE_OTHER): Payer: Medicare Other

## 2013-04-26 ENCOUNTER — Ambulatory Visit (INDEPENDENT_AMBULATORY_CARE_PROVIDER_SITE_OTHER): Payer: Medicare Other | Admitting: Adult Health

## 2013-04-26 VITALS — BP 140/80 | HR 82 | Temp 97.6°F | Ht 62.0 in | Wt 108.4 lb

## 2013-04-26 DIAGNOSIS — R350 Frequency of micturition: Secondary | ICD-10-CM

## 2013-04-26 DIAGNOSIS — N390 Urinary tract infection, site not specified: Secondary | ICD-10-CM

## 2013-04-26 DIAGNOSIS — M810 Age-related osteoporosis without current pathological fracture: Secondary | ICD-10-CM

## 2013-04-26 LAB — URINALYSIS, ROUTINE W REFLEX MICROSCOPIC
Specific Gravity, Urine: 1.01 (ref 1.000–1.030)
Total Protein, Urine: NEGATIVE
Urine Glucose: NEGATIVE
pH: 6.5 (ref 5.0–8.0)

## 2013-04-26 MED ORDER — CIPROFLOXACIN HCL 250 MG PO TABS: 250.0000 mg | ORAL_TABLET | Freq: Two times a day (BID) | ORAL | Status: DC

## 2013-04-26 NOTE — Patient Instructions (Addendum)
Cipro 250mg  Twice daily  For 7 days  Push fluids  Healthy urinary practices.  Return in couple of weeks for labs for Reclast.  Please contact office for sooner follow up if symptoms do not improve or worsen or seek emergency care
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
## 2013-04-30 ENCOUNTER — Encounter: Payer: Self-pay | Admitting: Adult Health

## 2013-04-30 ENCOUNTER — Other Ambulatory Visit: Payer: Self-pay | Admitting: Adult Health

## 2013-04-30 DIAGNOSIS — N390 Urinary tract infection, site not specified: Secondary | ICD-10-CM | POA: Insufficient documentation

## 2013-04-30 LAB — URINE CULTURE

## 2013-04-30 MED ORDER — DOXYCYCLINE HYCLATE 100 MG PO TABS: 100.0000 mg | ORAL_TABLET | Freq: Two times a day (BID) | ORAL | Status: DC

## 2013-04-30 NOTE — Assessment & Plan Note (Signed)
BMD 7/22 with worsening Tscore at -3.2  Cont with yearly Reclast  Cont on Calcium and vitamin D

## 2013-04-30 NOTE — Assessment & Plan Note (Signed)
UTI  U Dip with + leuko and bacteria   Plan  Cipro 250mg  Twice daily  X 7 days  Urinary hygiene regimen discussed

## 2013-05-06 ENCOUNTER — Telehealth: Payer: Self-pay | Admitting: Internal Medicine

## 2013-05-06 DIAGNOSIS — J479 Bronchiectasis, uncomplicated: Secondary | ICD-10-CM

## 2013-05-06 NOTE — Telephone Encounter (Signed)
I spoke with pt. She stated she was in to see TP 04/26/13. She is wanting to get set up with a walker w/ seat to help maneuver around and a place to put her O2 underneath it. Since TP is scheduled off will forward to MW to see if okay to place order. Please advise thanks

## 2013-05-06 NOTE — Telephone Encounter (Signed)
Ok to write orders

## 2013-05-07 NOTE — Telephone Encounter (Signed)
Pt advised. Jadelin Eng, CMA  

## 2013-05-07 NOTE — Telephone Encounter (Signed)
Order has been placed to [facility]. ATC pt NA WCB

## 2013-05-14 ENCOUNTER — Other Ambulatory Visit: Payer: Self-pay | Admitting: Internal Medicine

## 2013-06-03 ENCOUNTER — Telehealth: Payer: Self-pay | Admitting: Internal Medicine

## 2013-06-03 NOTE — Telephone Encounter (Signed)
I spoke with [place]. She stated she faxed over an order for MW to sign for pt to get PT/OT. She faxed this to [phone] about 5 times. Please advise if this was received Verlon Au thanks

## 2013-06-03 NOTE — Telephone Encounter (Signed)
ATC santana NA and VM is not set up. WCB

## 2013-06-03 NOTE — Telephone Encounter (Signed)
I have sent it back to be faxed already last wk We received another one this am and it is in [facility] lookat Will fax back once he signs it again

## 2013-06-04 NOTE — Telephone Encounter (Signed)
ATC Santana at the # provided.  Received msg that VM is not yet set up.  WCB

## 2013-06-05 NOTE — Telephone Encounter (Signed)
I already faxed this back more than once I am closing encounter

## 2013-06-12 ENCOUNTER — Other Ambulatory Visit: Payer: Self-pay | Admitting: Internal Medicine

## 2013-06-12 ENCOUNTER — Encounter: Payer: Self-pay | Admitting: Internal Medicine

## 2013-06-12 ENCOUNTER — Ambulatory Visit (INDEPENDENT_AMBULATORY_CARE_PROVIDER_SITE_OTHER): Payer: Medicare Other | Admitting: Internal Medicine

## 2013-06-12 ENCOUNTER — Other Ambulatory Visit (INDEPENDENT_AMBULATORY_CARE_PROVIDER_SITE_OTHER): Payer: Medicare Other

## 2013-06-12 VITALS — BP 130/88 | HR 80 | Temp 97.6°F | Ht 62.0 in | Wt 104.8 lb

## 2013-06-12 DIAGNOSIS — M810 Age-related osteoporosis without current pathological fracture: Secondary | ICD-10-CM

## 2013-06-12 DIAGNOSIS — E785 Hyperlipidemia, unspecified: Secondary | ICD-10-CM

## 2013-06-12 DIAGNOSIS — Z23 Encounter for immunization: Secondary | ICD-10-CM

## 2013-06-12 DIAGNOSIS — E039 Hypothyroidism, unspecified: Secondary | ICD-10-CM

## 2013-06-12 DIAGNOSIS — J9610 Chronic respiratory failure, unspecified whether with hypoxia or hypercapnia: Secondary | ICD-10-CM

## 2013-06-12 DIAGNOSIS — I10 Essential (primary) hypertension: Secondary | ICD-10-CM

## 2013-06-12 DIAGNOSIS — J479 Bronchiectasis, uncomplicated: Secondary | ICD-10-CM

## 2013-06-12 LAB — URINALYSIS, ROUTINE W REFLEX MICROSCOPIC
Bilirubin Urine: NEGATIVE
Hgb urine dipstick: NEGATIVE
Ketones, ur: NEGATIVE
Total Protein, Urine: NEGATIVE
Urine Glucose: NEGATIVE
pH: 6.5 (ref 5.0–8.0)

## 2013-06-12 LAB — LIPID PANEL: Cholesterol: 181 mg/dL (ref 0–200)

## 2013-06-12 LAB — CBC WITH DIFFERENTIAL/PLATELET
Basophils Absolute: 0.1 10*3/uL (ref 0.0–0.1)
Eosinophils Relative: 1.8 % (ref 0.0–5.0)
HCT: 39.3 % (ref 36.0–46.0)
Hemoglobin: 12.6 g/dL (ref 12.0–15.0)
Lymphs Abs: 2.2 10*3/uL (ref 0.7–4.0)
MCV: 79.3 fl (ref 78.0–100.0)
Monocytes Absolute: 1.4 10*3/uL — ABNORMAL HIGH (ref 0.1–1.0)
Monocytes Relative: 13.4 % — ABNORMAL HIGH (ref 3.0–12.0)
Neutro Abs: 6.5 10*3/uL (ref 1.4–7.7)
RDW: 16.4 % — ABNORMAL HIGH (ref 11.5–14.6)

## 2013-06-12 LAB — HEPATIC FUNCTION PANEL
ALT: 14 U/L (ref 0–35)
AST: 21 U/L (ref 0–37)
Albumin: 3.5 g/dL (ref 3.5–5.2)

## 2013-06-12 LAB — BASIC METABOLIC PANEL
BUN: 11 mg/dL (ref 6–23)
Chloride: 94 mEq/L — ABNORMAL LOW (ref 96–112)
Glucose, Bld: 93 mg/dL (ref 70–99)
Potassium: 3.5 mEq/L (ref 3.5–5.1)

## 2013-06-12 MED ORDER — LANSOPRAZOLE 15 MG PO CPDR: DELAYED_RELEASE_CAPSULE | ORAL | Status: DC

## 2013-06-12 NOTE — Patient Instructions (Addendum)
Please remember to go to the lab and x-ray department downstairs for your tests - we will call you with the results when they are available.  See calendar for specific medication instructions and bring it back for each and every office visit for every healthcare provider you see.  Without it,  you may not receive the best quality medical care that we feel you deserve.  You will note that the calendar groups together  your maintenance  medications that are timed at particular times of the day.  Think of this as your checklist for what your doctor has instructed you to do until your next evaluation to see what benefit  there is  to staying on a consistent group of medications intended to keep you well.  The other group at the bottom is entirely up to you to use as you see fit  for specific symptoms that may arise between visits that require you to treat them on an as needed basis.  Think of this as your action plan or "what if" list.   Separating the top medications from the bottom group is fundamental to providing you adequate care going forward.       Please schedule a follow up office visit in 4 weeks, sooner if needed to see [name] re Reclast

## 2013-06-12 NOTE — Progress Notes (Addendum)
Subjective:     Patient ID: [name], female   DOB: April 16, 1933 .   MRN: [id number]   Brief patient profile:  9 yowf never smoker with documented chronic sinusitis /bronchiectasis and a tendency to recurrent purulent exacerbations, has been self titrating prednisone with a ceiling of 40 mg per day and a floor of 5 extensively evaluated at [facility] and by [name] with neg w/u x atopic.    HPI  12/19/2011 Follow up  Patient returns today for a followup visit. Patient has history of osteoporosis and has been on yearly Reclast infusions. She is due for her Reclast this month. Takes oscal d Twice daily   Over the last week. Complains of increased cough, congestion, with thick, green mucus. Has not filled her standing rx for omnicef. Currently on prednisone 10mg  daily  No hemoptysis , chest pain or edema.  >>Reclast rx and Omnicef rx x 10 d       04/26/2013 Acute OV  Complains of increased urinary frequency, with cloudiness x2 weeks.  Dyspnea is unchanged; denies dysurina, pressure, odor. No back pain, n/v, hematuria or fever.  No recent UTI . Over last 2 weeks more symptoms of urinary urgency and frequenty. Last few days, urine has strong odor and is dark and cloudy. No dysuria or discharge. Also BMD returned with worsening T score -3.2. Discussed continuing yearly Reclast. Advised to cont on calcium and vitamin d.  rec Cipro 250mg  Twice daily  For 7 days  Push fluids  Healthy urinary practices.  Return in couple of weeks for labs for Reclast> did not return   06/12/2013 f/u ov/[name] re: bronchiectasis/ hypothyroid/hbp/ yearly comprehensive  Chief Complaint  Patient presents with  . Annual Exam    Pt fasting. Pt c/o increased SOB over the past several months. Her cough is also worse and is prod with green sputum for the past 2 months. Taking pred 10 mg daily.   doxy rx > no better.  Can't tol vest.   No obvious daytime variabilty or assoc cp or chest tightness, subjective  wheeze overt sinus or hb symptoms. No unusual exp hx or h/o childhood pna/ asthma or knowledge of premature birth.   Sleeping ok without nocturnal  or early am exacerbation  of respiratory  c/o's or need for noct saba. Also denies any obvious fluctuation of symptoms with weather or environmental changes or other aggravating or alleviating factors except as outlined above   Current Medications, Allergies, Complete Past Medical History, Past Surgical History, Family History, and Social History were reviewed in [employer] record.  ROS  The following are not active complaints unless bolded sore throat, dysphagia, dental problems, itching, sneezing,  nasal congestion or excess/ purulent secretions, ear ache,   fever, chills, sweats, unintended wt loss, pleuritic or exertional cp, hemoptysis,  orthopnea pnd or leg swelling, presyncope, palpitations, heartburn, abdominal pain, anorexia, nausea, vomiting, diarrhea  or change in bowel or urinary habits, change in stools or urine, dysuria,hematuria,  rash, arthralgias, visual complaints, headache, numbness weakness or ataxia or problems with walking or coordination,  change in mood/affect or memory.                     Allergies  1) ! Iodine  2) Sulfamethoxazole (Sulfamethoxazole)  3) Erythromycin Ethylsuccinate    Past Medical History:  HYPOTHYROIDISM (ICD-244.9)  BRONCHIECTASIS W/ACUTE EXACERBATION (ICD-494.1)  - Xolair attempted 6/04 -> 9/05 no benefit - Immunotherapy stopped 04/2008  - Started xyflo  8/09 > 05/2008 no benefit  - VEST 2008 as inpt > no benefit  - Flutter valve helpful March 17, 2009  - Allergy profile April 12, 2010 >> IgE 30 unremarkable  - Dulera 200 added November 22, 2010  Could not tol thrush HYPERLIPIDEMIA (ICD-272.4)  - Target LDL < 70 due to prev TIA's  TRANSIENT ISCHEMIC ATTACKS, HX OF (ICD-V12.50).............................................Marland KitchenReynolds  OSTEOPOROSIS (ICD-733.00)  - Reclast  January 2009, 2010, October 26, 2009 , 11/2010 , 11/3011 , due >01/10/12  - Bone Density 08/05/09 Spine 0.1 Left -2.2, Right -1.6  Health Maintenance...................................................................................................Marland KitchenWert  -Td 11/2003 -Pneumovax 11/04 and 05/2008 -Med calendar 12/24/08  And 03/01/11, 10/06/2011  -CPX 06/12/2013  -GYN health maint...........................................................................Marland Kitchen  Mody Asthma  Diverticulosis  - Colonoscopy 01/01/09.....................................................................Marland KitchenRussella Dar  GERD  Irritable Bowel Syndrome  Hemorrhoids  R Shoulder Pain..................................................................................[name]  Hypothyroid     Social History She never married. No kids. Took care of her parents all her life  She has a gentlemen friend that she has been with since she was 42 but he lives in [place]  (He is now in his 90s but she never wanted to marry him).  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 ,max sinus tenderness  NECK : without JVD/Nodes/TM/ nl carotid upstrokes bilaterally  LUNGS: no acc muscle use, pan exp sonorous junky rhonchi  CV: RRR no s3 or murmur or increase in P2, no edema  ABD: soft and nontender with nl excursion in the supine position. No bruits or organomegaly, bowel sounds nl  Neg CVA tenderness  MS: nl gait/  warm without deformities, calf tenderness, cyanosis or clubbing Neuro alert/approp/ no motor or cerebellar def      cxr 06/12/13 Right  greater left, predominately lung base, coarse and irregular reticular nodular opacities. There has been a mild increase in these opacities in the right middle lobe when compared to the 2011 exam. Interstitial fibrosis is suspected, but the nodular component is atypical. A followup high-resolution chest CT is suggested for further characterization.  No convincing acute finding.    Assessment:

## 2013-06-12 NOTE — Progress Notes (Signed)
Quick Note:  Spoke with pt and notified of results per Dr. [name]. Pt verbalized understanding and denied any questions.  ______ 

## 2013-06-13 ENCOUNTER — Other Ambulatory Visit: Payer: Self-pay | Admitting: Internal Medicine

## 2013-06-13 ENCOUNTER — Ambulatory Visit (INDEPENDENT_AMBULATORY_CARE_PROVIDER_SITE_OTHER)
Admission: RE | Admit: 2013-06-13 | Discharge: 2013-06-13 | Disposition: A | Discharge status: Home / Self Care | Payer: Medicare Other | Source: Ambulatory Visit | Attending: Internal Medicine | Admitting: Internal Medicine

## 2013-06-13 DIAGNOSIS — J479 Bronchiectasis, uncomplicated: Secondary | ICD-10-CM

## 2013-06-13 MED ORDER — LEVOTHYROXINE SODIUM 100 MCG PO CAPS: ORAL_CAPSULE | ORAL | Status: DC

## 2013-06-13 NOTE — Progress Notes (Signed)
Quick Note:  Spoke with pt and notified of results per Dr. [name]. Pt verbalized understanding and denied any questions.  ______ 

## 2013-06-13 NOTE — Assessment & Plan Note (Signed)
-   Xolair attempted 6/04 -> 05/2004  - Immunotherapy stopped 8/09  - Started xyflo 8/09 > 9/09 no benefit  - VEST 2008 no benefit  - Flutter valve helpful March 17, 2009  - Allergy profile April 12, 2010 >> IgE 30 unremarkable  - Dulera 200 added November 22, 2010 > could not tolerate -Sputum cx 08/29/2011 +ESCHERICHIA COLI mixed sensitivities > flare tx w/ augmentin (d/t sulfa all)   -perfomist 02/17/2012 > no benefit > rechallenge 12/27/2012   augmentin best option > rx 10 days cycle    Each maintenance medication was reviewed in detail including most importantly the difference between maintenance and as needed and under what circumstances the prns are to be used. This was done in the context of a medication calendar review which provided the patient with a user-friendly unambiguous mechanism for medication administration and reconciliation and provides an action plan for all active problems. It is critical that this be shown to every doctor  for modification during the office visit if necessary so the patient can use it as a working document.

## 2013-06-13 NOTE — Assessment & Plan Note (Signed)
-   tsh 0.12 on 112 so reduced to 88 mcg per day   12/26/12 - Tsh  8.6 on 88 so increased to 100 mcg per day 06/12/2013 >>>

## 2013-06-13 NOTE — Assessment & Plan Note (Signed)
Patient Saturations on Room Air at Rest = 85%  12-19-11     - 02/17/2012  Walked 3lpm pulsed  x 1 laps @ 185 ft each stopped due to sob and sat 89%      -rx =  2lpm at hs and with activity within room,  3lpm pulsed on port concentrator when out of room at [facility] effective 12/25/2012    Adequate control on present rx, reviewed > no change in rx needed

## 2013-06-13 NOTE — Assessment & Plan Note (Signed)
Adequate control on present rx, reviewed > no change in rx needed   

## 2013-06-13 NOTE — Assessment & Plan Note (Signed)
--  BMD 11/10 -2.2 > reclast rx started 10/24/08              --#1 infusion 10/24/08   --#2 infusion 11/16/09             --#3 infusion 12/09/10   ---#4 Infusion set for 01/10/12              - Vit D level 40  03/12/12 and ok 9/10/02/12 also  BMD 04/16/13 >max T score -3.2 , cont Reclast > See instructions for specific recommendations which were reviewed directly with the patient who was given a copy with highlighter outlining the key components.

## 2013-06-13 NOTE — Assessment & Plan Note (Signed)
-   Target LDL < 70 due to prev TIA's   Lab Results  Component Value Date   CHOL 181 06/12/2013   HDL 75.00 06/12/2013   LDLCALC 86 06/12/2013   LDLDIRECT 140.3 12/26/2006   TRIG 98.0 06/12/2013   CHOLHDL 2 06/12/2013     Adequate control on present rx, reviewed > no change in rx needed

## 2013-06-23 ENCOUNTER — Other Ambulatory Visit: Payer: Self-pay | Admitting: Internal Medicine

## 2013-06-25 ENCOUNTER — Other Ambulatory Visit: Payer: Self-pay | Admitting: Internal Medicine

## 2013-07-09 ENCOUNTER — Encounter: Payer: Self-pay | Admitting: Adult Health

## 2013-07-09 ENCOUNTER — Ambulatory Visit (INDEPENDENT_AMBULATORY_CARE_PROVIDER_SITE_OTHER): Payer: Medicare Other | Admitting: Adult Health

## 2013-07-09 ENCOUNTER — Other Ambulatory Visit (INDEPENDENT_AMBULATORY_CARE_PROVIDER_SITE_OTHER): Payer: Medicare Other

## 2013-07-09 VITALS — BP 142/66 | HR 78 | Temp 97.4°F | Ht 62.0 in | Wt 104.8 lb

## 2013-07-09 DIAGNOSIS — J479 Bronchiectasis, uncomplicated: Secondary | ICD-10-CM

## 2013-07-09 DIAGNOSIS — M810 Age-related osteoporosis without current pathological fracture: Secondary | ICD-10-CM

## 2013-07-09 DIAGNOSIS — E039 Hypothyroidism, unspecified: Secondary | ICD-10-CM

## 2013-07-09 LAB — BASIC METABOLIC PANEL
CO2: 31 mEq/L (ref 19–32)
Chloride: 94 mEq/L — ABNORMAL LOW (ref 96–112)
Creatinine, Ser: 0.9 mg/dL (ref 0.4–1.2)
Potassium: 4.5 mEq/L (ref 3.5–5.1)
Sodium: 132 mEq/L — ABNORMAL LOW (ref 135–145)

## 2013-07-09 NOTE — Assessment & Plan Note (Addendum)
TSH subtherapeutic last month Cont on synthroid , check tsh

## 2013-07-09 NOTE — Assessment & Plan Note (Signed)
Severe Osteoporosis NML Vitamin D   Set up Reclast infusion #5 .  May need to take drug holiday after this infusion for couple of years despite severe osteoporosis.  Cont on oscal d

## 2013-07-09 NOTE — Assessment & Plan Note (Signed)
Recent flare now resolved back to her baseline.  Cont on current regimen

## 2013-07-09 NOTE — Patient Instructions (Signed)
Continue on current regimen  We are setting you up for Reclast infusion  Follow up Dr. [name]  In 6-8 weeks and As needed

## 2013-07-09 NOTE — Progress Notes (Signed)
Subjective:     Patient ID: [name], female   DOB: 03-Nov-1932 .   MRN: [id number]   Brief patient profile:  87 yowf never smoker with documented chronic sinusitis /bronchiectasis and a tendency to recurrent purulent exacerbations, has been self titrating prednisone with a ceiling of 40 mg per day and a floor of 5 extensively evaluated at [facility] and by [name] with neg w/u x atopic.    HPI  12/19/2011 Follow up  Patient returns today for a followup visit. Patient has history of osteoporosis and has been on yearly Reclast infusions. She is due for her Reclast this month. Takes oscal d Twice daily   Over the last week. Complains of increased cough, congestion, with thick, green mucus. Has not filled her standing rx for omnicef. Currently on prednisone 10mg  daily  No hemoptysis , chest pain or edema.  >>Reclast rx and Omnicef rx x 10 d       04/26/2013 Acute OV  Complains of increased urinary frequency, with cloudiness x2 weeks.  Dyspnea is unchanged; denies dysurina, pressure, odor. No back pain, n/v, hematuria or fever.  No recent UTI . Over last 2 weeks more symptoms of urinary urgency and frequenty. Last few days, urine has strong odor and is dark and cloudy. No dysuria or discharge. Also BMD returned with worsening T score -3.2. Discussed continuing yearly Reclast. Advised to cont on calcium and vitamin d.  rec Cipro 250mg  Twice daily  For 7 days  Push fluids  Healthy urinary practices.  Return in couple of weeks for labs for Reclast> did not return   06/12/2013 f/u ov/[name] re: bronchiectasis/ hypothyroid/hbp/ yearly comprehensive  Chief Complaint  Patient presents with  . Annual Exam    Pt fasting. Pt c/o increased SOB over the past several months. Her cough is also worse and is prod with green sputum for the past 2 months. Taking pred 10 mg daily.   doxy rx > no better.  Can't tol vest.   >>labs w/ good cholesterol at goal , TSH elevated w/ increased dose of  synthroid rx   07/09/2013 Follow up  4 week follow up Bronchiectasis and discuss Reclast  Had recent flare of bronchiectasis that resolved with abx and return to her baseline.  Reports breathing is primarily unchanged since last ov  . Cough not quite as bad. No fever or hemoptysis.  Is due for Reclast. BMD w/ T score -3.2.  Lost her long term boyfriend last week, he passed away with dementia. They never married. Says she is dealing ok but is very sad, support given  She remains in Independent Living at [facility].       Current Medications, Allergies, Complete Past Medical History, Past Surgical History, Family History, and Social History were reviewed in [employer] record.  ROS  The following are not active complaints unless bolded sore throat, dysphagia, dental problems, itching, sneezing,  nasal congestion or excess/ purulent secretions, ear ache,   fever, chills, sweats, unintended wt loss, pleuritic or exertional cp, hemoptysis,  orthopnea pnd or leg swelling, presyncope, palpitations, heartburn, abdominal pain, anorexia, nausea, vomiting, diarrhea  or change in bowel or urinary habits, change in stools or urine, dysuria,hematuria,  rash, arthralgias, visual complaints, headache, numbness weakness or ataxia or problems with walking or coordination,  change in mood/affect or memory.                     Allergies  1) !  Iodine  2) Sulfamethoxazole (Sulfamethoxazole)  3) Erythromycin Ethylsuccinate    Past Medical History:  HYPOTHYROIDISM (ICD-244.9)  BRONCHIECTASIS W/ACUTE EXACERBATION (ICD-494.1)  - Xolair attempted 6/04 -> 9/05 no benefit - Immunotherapy stopped 04/2008  - Started xyflo 8/09 > 05/2008 no benefit  - VEST 2008 as inpt > no benefit  - Flutter valve helpful March 17, 2009  - Allergy profile April 12, 2010 >> IgE 30 unremarkable  - Dulera 200 added November 22, 2010  Could not tol thrush HYPERLIPIDEMIA (ICD-272.4)  - Target LDL <  70 due to prev TIA's  TRANSIENT ISCHEMIC ATTACKS, HX OF (ICD-V12.50).............................................Marland KitchenReynolds  OSTEOPOROSIS (ICD-733.00)  - Reclast January 2009, 2010, October 26, 2009 , 11/2010 , 11/3011 , due >01/10/12  - Bone Density 08/05/09 Spine 0.1 Left -2.2, Right -1.6  Health Maintenance...................................................................................................Marland KitchenWert  -Td 11/2003 -Pneumovax 11/04 and 05/2008 -Med calendar 12/24/08  And 03/01/11, 10/06/2011  -CPX 06/12/2013  -GYN health maint...........................................................................Marland Kitchen  Mody Asthma  Diverticulosis  - Colonoscopy 01/01/09.....................................................................Marland KitchenRussella Dar  GERD  Irritable Bowel Syndrome  Hemorrhoids  R Shoulder Pain..................................................................................[name]  Hypothyroid     Social History She never married. No kids. Took care of her parents all her life  She has a gentlemen friend that she has been with since she was 61 but he lives in [place]  (He is now in his 90s but she never wanted to marry him).  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 ,max sinus tenderness  NECK : without JVD/Nodes/TM/ nl carotid upstrokes bilaterally  LUNGS: no acc muscle use, pan exp sonorous junky rhonchi  CV: RRR no s3 or murmur or increase in P2, no edema  ABD: soft and nontender with nl excursion in the supine position. No bruits or organomegaly, bowel sounds nl    MS: nl gait/  warm without deformities, calf tenderness, cyanosis or clubbing Neuro alert/approp/ no motor or cerebellar def      cxr 06/12/13 Right greater left, predominately lung base, coarse and irregular reticular nodular opacities. There has been a mild increase in these opacities in the right middle lobe when compared to the 2011 exam. Interstitial fibrosis is suspected, but the nodular component is atypical. A followup high-resolution chest CT is suggested for further characterization.  No convincing acute finding.    Assessment:

## 2013-07-18 ENCOUNTER — Encounter: Payer: Self-pay | Admitting: Internal Medicine

## 2013-07-18 DIAGNOSIS — Z0000 Encounter for general adult medical examination without abnormal findings: Secondary | ICD-10-CM | POA: Insufficient documentation

## 2013-08-05 ENCOUNTER — Encounter: Payer: Self-pay | Admitting: Internal Medicine

## 2013-08-07 ENCOUNTER — Ambulatory Visit (INDEPENDENT_AMBULATORY_CARE_PROVIDER_SITE_OTHER)
Admission: RE | Admit: 2013-08-07 | Discharge: 2013-08-07 | Disposition: A | Discharge status: Home / Self Care | Payer: Medicare Other | Source: Ambulatory Visit | Attending: Internal Medicine | Admitting: Internal Medicine

## 2013-08-07 ENCOUNTER — Ambulatory Visit (INDEPENDENT_AMBULATORY_CARE_PROVIDER_SITE_OTHER): Payer: Medicare Other | Admitting: Internal Medicine

## 2013-08-07 ENCOUNTER — Encounter: Payer: Self-pay | Admitting: Internal Medicine

## 2013-08-07 ENCOUNTER — Other Ambulatory Visit (INDEPENDENT_AMBULATORY_CARE_PROVIDER_SITE_OTHER): Payer: Medicare Other

## 2013-08-07 ENCOUNTER — Telehealth: Payer: Self-pay | Admitting: Internal Medicine

## 2013-08-07 VITALS — BP 124/60 | HR 89 | Temp 97.8°F | Ht 62.0 in | Wt 106.0 lb

## 2013-08-07 DIAGNOSIS — E039 Hypothyroidism, unspecified: Secondary | ICD-10-CM

## 2013-08-07 DIAGNOSIS — M810 Age-related osteoporosis without current pathological fracture: Secondary | ICD-10-CM

## 2013-08-07 DIAGNOSIS — I10 Essential (primary) hypertension: Secondary | ICD-10-CM

## 2013-08-07 DIAGNOSIS — J479 Bronchiectasis, uncomplicated: Secondary | ICD-10-CM

## 2013-08-07 DIAGNOSIS — J9610 Chronic respiratory failure, unspecified whether with hypoxia or hypercapnia: Secondary | ICD-10-CM

## 2013-08-07 LAB — CBC WITH DIFFERENTIAL/PLATELET
Basophils Absolute: 0.1 10*3/uL (ref 0.0–0.1)
Eosinophils Absolute: 0.1 10*3/uL (ref 0.0–0.7)
Eosinophils Relative: 0.5 % (ref 0.0–5.0)
HCT: 32.4 % — ABNORMAL LOW (ref 36.0–46.0)
Lymphs Abs: 1.4 10*3/uL (ref 0.7–4.0)
MCV: 78.8 fl (ref 78.0–100.0)
Monocytes Absolute: 2.2 10*3/uL — ABNORMAL HIGH (ref 0.1–1.0)
Neutrophils Relative %: 80.2 % — ABNORMAL HIGH (ref 43.0–77.0)
Platelets: 408 10*3/uL — ABNORMAL HIGH (ref 150.0–400.0)
RDW: 15.7 % — ABNORMAL HIGH (ref 11.5–14.6)
WBC: 19.4 10*3/uL (ref 4.5–10.5)

## 2013-08-07 LAB — BASIC METABOLIC PANEL
BUN: 13 mg/dL (ref 6–23)
CO2: 32 mEq/L (ref 19–32)
Chloride: 93 mEq/L — ABNORMAL LOW (ref 96–112)
GFR: 63.55 mL/min (ref >60.00)
Potassium: 3.3 mEq/L — ABNORMAL LOW (ref 3.5–5.1)

## 2013-08-07 LAB — TSH: TSH: 4.33 u[IU]/mL (ref 0.35–5.50)

## 2013-08-07 NOTE — Progress Notes (Signed)
Subjective:     Patient ID: [name], female   DOB: [birth year] .   MRN: [id number]   Brief patient profile:  66 yowf never smoker with documented chronic sinusitis /bronchiectasis and a tendency to recurrent purulent exacerbations, has been self titrating prednisone with a ceiling of 40 mg per day and a floor of 5 extensively evaluated at [facility] and by [name] with neg w/u x atopic.    HPI  12/19/2011 Follow up  Patient returns today for a followup visit. Patient has history of osteoporosis and has been on yearly Reclast infusions. She is due for her Reclast this month. Takes oscal d Twice daily   Over the last week. Complains of increased cough, congestion, with thick, green mucus. Has not filled her standing rx for omnicef. Currently on prednisone 10mg  daily  No hemoptysis , chest pain or edema.  >>Reclast rx and Omnicef rx x 10 d       04/26/2013 Acute OV  Complains of increased urinary frequency, with cloudiness x2 weeks.  Dyspnea is unchanged; denies dysurina, pressure, odor. No back pain, n/v, hematuria or fever.  No recent UTI . Over last 2 weeks more symptoms of urinary urgency and frequenty. Last few days, urine has strong odor and is dark and cloudy. No dysuria or discharge. Also BMD returned with worsening T score -3.2. Discussed continuing yearly Reclast. Advised to cont on calcium and vitamin d.  rec Cipro 250mg  Twice daily  For 7 days  Push fluids  Healthy urinary practices.  Return in couple of weeks for labs for Reclast> did not return   06/12/2013 f/u ov/Morine Kohlman re: bronchiectasis/ hypothyroid/hbp/ yearly comprehensive  Chief Complaint  Patient presents with  . Annual Exam    Pt fasting. Pt c/o increased SOB over the past several months. Her cough is also worse and is prod with green sputum for the past 2 months. Taking pred 10 mg daily.   doxy rx > no better.  Can't tol vest.   >>labs w/ good cholesterol at goal , TSH elevated w/ increased dose of  synthroid rx   07/09/2013 Follow up  4 week follow up Bronchiectasis and discuss Reclast  Had recent flare of bronchiectasis that resolved with abx and return to her baseline.  Reports breathing is primarily unchanged since last ov  . Cough not quite as bad. No fever or hemoptysis.  Is due for Reclast. BMD w/ T score -3.2.  Lost her long term boyfriend last week, he passed away with dementia. They never married. Says she is dealing ok but is very sad, support given  She remains in Independent Living at [facility].  rec No change rx  08/07/2013 f/u ov/Tashonda Pinkus re: bronchiectasis / hypothyroid/hypertension/ osteo Chief Complaint  Patient presents with  . Acute Visit    Increased SOB,  cough w/ green mucus, wheezing and tightness x2 months    Just fiinshed a burst of pred, no recent abx,not really using her prns aggressively. Sob with more than room to room walking/ 02 dep    No obvious day to day or daytime variabilty or assoc  cp or chest tightness,   overt sinus or hb symptoms. No unusual exp hx or h/o childhood pna/ asthma or knowledge of premature birth.  Sleeping ok without nocturnal  or early am exacerbation  of respiratory  c/o's or need for noct saba. Also denies any obvious fluctuation of symptoms with weather or environmental changes or other aggravating or alleviating factors except  as outlined above   Current Medications, Allergies, Complete Past Medical History, Past Surgical History, Family History, and Social History were reviewed in [employer] record.  ROS  The following are not active complaints unless bolded sore throat, dysphagia, dental problems, itching, sneezing,  nasal congestion or excess/ purulent secretions, ear ache,   fever, chills, sweats, unintended wt loss, pleuritic or exertional cp, hemoptysis,  orthopnea pnd or leg swelling, presyncope, palpitations, heartburn, abdominal pain, anorexia, nausea, vomiting, diarrhea  or change in  bowel or urinary habits, change in stools or urine, dysuria,hematuria,  rash, arthralgias, visual complaints, headache, numbness weakness or ataxia or problems with walking or coordination,  change in mood, more depressed/affect or memory.                     Allergies  1) ! Iodine  2) Sulfamethoxazole (Sulfamethoxazole)  3) Erythromycin Ethylsuccinate    Past Medical History:  HYPOTHYROIDISM (ICD-244.9)  BRONCHIECTASIS W/ACUTE EXACERBATION (ICD-494.1)  - Xolair attempted 6/04 -> 9/05 no benefit - Immunotherapy stopped 04/2008  - Started xyflo 8/09 > 05/2008 no benefit  - VEST 2008 as inpt > no benefit  - Flutter valve helpful March 17, 2009  - Allergy profile April 12, 2010 >> IgE 30 unremarkable  - Dulera 200 added November 22, 2010  Could not tol thrush HYPERLIPIDEMIA (ICD-272.4)  - Target LDL < 70 due to prev TIA's  TRANSIENT ISCHEMIC ATTACKS, HX OF (ICD-V12.50).............................................Marland KitchenReynolds  OSTEOPOROSIS (ICD-733.00)  - Reclast January 2009, 2010, October 26, 2009 , 11/2010 , 11/3011 , due >01/10/12  - Bone Density 08/05/09 Spine 0.1 Left -2.2, Right -1.6  Health Maintenance...................................................................................................Marland KitchenWert  -Td 11/2003 -Pneumovax 11/04 and 05/2008 -Med calendar 12/24/08  And 03/01/11, 10/06/2011  -CPX 06/12/2013  -GYN health maint...........................................................................Marland Kitchen  Mody Asthma  Diverticulosis  - Colonoscopy 01/01/09.....................................................................Marland KitchenRussella Dar  GERD  Irritable Bowel Syndrome  Hemorrhoids  R Shoulder Pain..................................................................................[name]  Hypothyroid     Social History She never married. No kids. Took care of her parents all her life  She has a gentlemen friend that she has been with since she was 44 but he lives in [place]  (He is now in  his 90s but she never wanted to marry him).  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 ,max sinus tenderness  NECK : without JVD/Nodes/TM/ nl carotid upstrokes bilaterally  LUNGS: no acc muscle use, pan exp sonorous junky rhonchi and new sts over R scapula s discoloration/ bruising tender to touch CV: RRR no s3 or murmur or increase in P2, no edema  ABD: soft and nontender with nl excursion in the supine position. No bruits or organomegaly, bowel sounds nl   MS: nl gait/  warm without deformities, calf tenderness, cyanosis or clubbing Neuro alert/approp/ no motor or cerebellar def      CXR  08/07/2013 :  COPD. Stable bibasilar nodular densities which may be saccular bronchiectasis with fluid-filled dilated airways. . CT  chest suggested for further evaluation.      Assessment:

## 2013-08-07 NOTE — Patient Instructions (Addendum)
Apply ice pack to R shoulder  Please remember to go to the lab and x-ray department downstairs for your tests - we will call you with the results when they are available.     See calendar for specific medication instructions and bring it back for each and every office visit for every healthcare provider you see.  Without it,  you may not receive the best quality medical care that we feel you deserve.  You will note that the calendar groups together  your maintenance  medications that are timed at particular times of the day.  Think of this as your checklist for what your doctor has instructed you to do until your next evaluation to see what benefit  there is  to staying on a consistent group of medications intended to keep you well.  The other group at the bottom is entirely up to you to use as you see fit  for specific symptoms that may arise between visits that require you to treat them on an as needed basis.  Think of this as your action plan or "what if" list.   Separating the top medications from the bottom group is fundamental to providing you adequate care going forward.    Please schedule a follow up office visit in 6 weeks, call sooner if needed to see Tammy on return

## 2013-08-07 NOTE — Telephone Encounter (Signed)
I spoke with pt and appt scheduled to come in today at 11:45. Nothing further needed

## 2013-08-08 ENCOUNTER — Ambulatory Visit (HOSPITAL_COMMUNITY)
Admission: RE | Admit: 2013-08-08 | Discharge: 2013-08-08 | Disposition: A | Discharge status: Home / Self Care | Payer: Medicare Other | Source: Ambulatory Visit | Attending: Internal Medicine | Admitting: Internal Medicine

## 2013-08-08 ENCOUNTER — Other Ambulatory Visit (HOSPITAL_COMMUNITY): Payer: Self-pay | Admitting: Internal Medicine

## 2013-08-08 ENCOUNTER — Encounter (HOSPITAL_COMMUNITY): Payer: Self-pay

## 2013-08-08 DIAGNOSIS — M810 Age-related osteoporosis without current pathological fracture: Secondary | ICD-10-CM | POA: Insufficient documentation

## 2013-08-08 MED ORDER — SODIUM CHLORIDE 0.9 % IV SOLN
Freq: Once | INTRAVENOUS | Status: AC
  Administered 2013-08-08: 14:00:00 via INTRAVENOUS

## 2013-08-08 MED ORDER — ZOLEDRONIC ACID 5 MG/100ML IV SOLN
5.0000 mg | Freq: Once | INTRAVENOUS | Status: AC
  Administered 2013-08-08: 5 mg via INTRAVENOUS
  Filled 2013-08-08: qty 100

## 2013-08-09 ENCOUNTER — Encounter: Payer: Self-pay | Admitting: Internal Medicine

## 2013-08-09 ENCOUNTER — Other Ambulatory Visit: Payer: Self-pay | Admitting: Adult Health

## 2013-08-09 ENCOUNTER — Other Ambulatory Visit: Payer: Self-pay | Admitting: Internal Medicine

## 2013-08-09 DIAGNOSIS — R079 Chest pain, unspecified: Secondary | ICD-10-CM

## 2013-08-09 NOTE — Assessment & Plan Note (Signed)
--  BMD 11/10 -2.2 > reclast rx started 10/24/08              --#1 infusion 10/24/08   --#2 infusion 11/16/09             --#3 infusion 12/09/10   ---#4 Infusion set for 01/10/12              - Vit D level 40  03/12/12  BMD 04/16/13 >max T score -3.2 , cont Reclast > rx 08/08/13   Adequate control on present rx, reviewed > no change in rx needed

## 2013-08-09 NOTE — Assessment & Plan Note (Signed)
Patient Saturations on Room Air at Rest = 85%  12-19-11     - 02/17/2012  Walked 3lpm pulsed  x 1 laps @ 185 ft each stopped due to sob and sat 89%      -rx =  2lpm at hs and with activity within room,  3lpm pulsed on port concentrator when out of room at [facility] effective 12/25/2012    Adequate control on present rx, reviewed > no change in rx needed

## 2013-08-09 NOTE — Telephone Encounter (Signed)
Pt is requesting a refill on augmentin. Please advise MW thanks Pt seen 08/07/13

## 2013-08-09 NOTE — Assessment & Plan Note (Signed)
-   tsh 0.12 on 112 so reduced to 88 mcg per day   12/26/12 - Tsh  8.6 on 88 so increased to 100 mcg per day 06/12/2013 >   4.3 08/07/13 so no change rx

## 2013-08-09 NOTE — Progress Notes (Signed)
Quick Note:  Spoke with pt and notified of results per Dr. [name]. Pt verbalized understanding and denied any questions.  ______ 

## 2013-08-09 NOTE — Assessment & Plan Note (Signed)
-   Xolair attempted 6/04 -> 05/2004  - Immunotherapy stopped 8/09  - Started xyflo 8/09 > 9/09 no benefit  - VEST 2008 no benefit  - Flutter valve helpful March 17, 2009  - Allergy profile April 12, 2010 >> IgE 30 unremarkable  - Dulera 200 added November 22, 2010 > could not tolerate -Sputum cx 08/29/2011 +ESCHERICHIA COLI mixed sensitivities > flare tx w/ augmentin (d/t sulfa all)   -perfomist 02/17/2012 > no benefit > rechallenged  12/27/2012    Really nothing else to offer here Will check ct chest to see what is causing the swelling and pain in R post chest

## 2013-08-09 NOTE — Assessment & Plan Note (Signed)
Adequate control on present rx, reviewed > no change in rx needed      Each maintenance medication was reviewed in detail including most importantly the difference between maintenance and as needed and under what circumstances the prns are to be used. This was done in the context of a medication calendar review which provided the patient with a user-friendly unambiguous mechanism for medication administration and reconciliation and provides an action plan for all active problems. It is critical that this be shown to every doctor  for modification during the office visit if necessary so the patient can use it as a working document.

## 2013-08-13 ENCOUNTER — Ambulatory Visit (INDEPENDENT_AMBULATORY_CARE_PROVIDER_SITE_OTHER)
Admission: RE | Admit: 2013-08-13 | Discharge: 2013-08-13 | Disposition: A | Discharge status: Home / Self Care | Payer: Medicare Other | Source: Ambulatory Visit | Attending: Internal Medicine | Admitting: Internal Medicine

## 2013-08-13 DIAGNOSIS — R079 Chest pain, unspecified: Secondary | ICD-10-CM

## 2013-08-14 ENCOUNTER — Other Ambulatory Visit: Payer: Self-pay | Admitting: Internal Medicine

## 2013-08-14 NOTE — Progress Notes (Signed)
Quick Note:  Spoke with pt and notified of results per Dr. [name]. Pt verbalized understanding and denied any questions.  ______ 

## 2013-08-21 ENCOUNTER — Ambulatory Visit: Payer: Medicare Other | Admitting: Internal Medicine

## 2013-09-11 ENCOUNTER — Other Ambulatory Visit: Payer: Self-pay | Admitting: Internal Medicine

## 2013-09-16 ENCOUNTER — Encounter: Payer: Self-pay | Admitting: Adult Health

## 2013-09-16 ENCOUNTER — Ambulatory Visit (INDEPENDENT_AMBULATORY_CARE_PROVIDER_SITE_OTHER): Payer: Medicare Other | Admitting: Adult Health

## 2013-09-16 VITALS — BP 112/64 | HR 83 | Ht 62.0 in | Wt 107.8 lb

## 2013-09-16 DIAGNOSIS — I10 Essential (primary) hypertension: Secondary | ICD-10-CM

## 2013-09-16 DIAGNOSIS — J479 Bronchiectasis, uncomplicated: Secondary | ICD-10-CM

## 2013-09-16 DIAGNOSIS — J9610 Chronic respiratory failure, unspecified whether with hypoxia or hypercapnia: Secondary | ICD-10-CM

## 2013-09-16 NOTE — Assessment & Plan Note (Signed)
Continue on current oxygen settings

## 2013-09-16 NOTE — Patient Instructions (Signed)
Continue on current regimen.  follow up Dr. [name]  iin 6  weeks and As needed

## 2013-09-16 NOTE — Progress Notes (Signed)
Subjective:     Patient ID: [name], female   DOB: April 27, 1933 .   MRN: [id number]   Brief patient profile:  58 yowf never smoker with documented chronic sinusitis /bronchiectasis and a tendency to recurrent purulent exacerbations, has been self titrating prednisone with a ceiling of 40 mg per day and a floor of 5 extensively evaluated at [facility] and by [name] with neg w/u x atopic.    HPI  12/19/2011 Follow up  Patient returns today for a followup visit. Patient has history of osteoporosis and has been on yearly Reclast infusions. She is due for her Reclast this month. Takes oscal d Twice daily   Over the last week. Complains of increased cough, congestion, with thick, green mucus. Has not filled her standing rx for omnicef. Currently on prednisone 10mg  daily  No hemoptysis , chest pain or edema.  >>Reclast rx and Omnicef rx x 10 d       04/26/2013 Acute OV  Complains of increased urinary frequency, with cloudiness x2 weeks.  Dyspnea is unchanged; denies dysurina, pressure, odor. No back pain, n/v, hematuria or fever.  No recent UTI . Over last 2 weeks more symptoms of urinary urgency and frequenty. Last few days, urine has strong odor and is dark and cloudy. No dysuria or discharge. Also BMD returned with worsening T score -3.2. Discussed continuing yearly Reclast. Advised to cont on calcium and vitamin d.  rec Cipro 250mg  Twice daily  For 7 days  Push fluids  Healthy urinary practices.  Return in couple of weeks for labs for Reclast> did not return   06/12/2013 f/u ov/[name] re: bronchiectasis/ hypothyroid/hbp/ yearly comprehensive  Chief Complaint  Patient presents with  . Annual Exam    Pt fasting. Pt c/o increased SOB over the past several months. Her cough is also worse and is prod with green sputum for the past 2 months. Taking pred 10 mg daily.   doxy rx > no better.  Can't tol vest.   >>labs w/ good cholesterol at goal , TSH elevated w/ increased dose of  synthroid rx   07/09/2013 Follow up  4 week follow up Bronchiectasis and discuss Reclast  Had recent flare of bronchiectasis that resolved with abx and return to her baseline.  Reports breathing is primarily unchanged since last ov  . Cough not quite as bad. No fever or hemoptysis.  Is due for Reclast. BMD w/ T score -3.2.  Lost her long term boyfriend last week, he passed away with dementia. They never married. Says she is dealing ok but is very sad, support given  [facility] in [facility].  rec No change rx  08/07/2013 f/u ov/[name] re: bronchiectasis / hypothyroid/hypertension/ osteo Chief Complaint  Patient presents with  . Acute Visit    Increased SOB,  cough w/ green mucus, wheezing and tightness x2 months    Just fiinshed a burst of pred, no recent abx,not really using her prns aggressively. Sob with more than room to room walking/ 02 dep  >CT chest showed 08/09/13 extensive cylindrical bronchiectasis w/ extensive mucoid impaction   09/16/2013 Follow up  Pt returns ov for 6 week follow up for bronchiectasis.  Patient with recent bronchiectasis, flare, and treated with a course of prednisone, with her aggressive pulmonary hygiene with nebulizers. Patient reports she is slowly improving, however, to have a daily. Chronic cough. Patient had a CT chest last visit showing extensive bronchiectasis with extensive mucoid impactions, which has been  similar to her CTs in the past. Patient was also having some right shoulder blade pain. It has resolved. Patient denies any hemoptysis, orthopnea, PND, or leg swelling.   Current Medications, Allergies, Complete Past Medical History, Past Surgical History, Family History, and Social History were reviewed in [employer] record.  ROS  The following are not active complaints unless bolded sore throat, dysphagia, dental problems, itching, sneezing,  nasal congestion or excess/ purulent secretions,  ear ache,   fever, chills, sweats, unintended wt loss, pleuritic or exertional cp, hemoptysis,  orthopnea pnd or leg swelling, presyncope, palpitations, heartburn, abdominal pain, anorexia, nausea, vomiting, diarrhea  or change in bowel or urinary habits, change in stools or urine, dysuria,hematuria,  rash, arthralgias, visual complaints, headache, numbness weakness or ataxia or problems with walking or coordination         Allergies  1) ! Iodine  2) Sulfamethoxazole (Sulfamethoxazole)  3) Erythromycin Ethylsuccinate    Past Medical History:  HYPOTHYROIDISM (ICD-244.9)  BRONCHIECTASIS W/ACUTE EXACERBATION (ICD-494.1)  - Xolair attempted 6/04 -> 9/05 no benefit - Immunotherapy stopped 04/2008  - Started xyflo 8/09 > 05/2008 no benefit  - VEST 2008 as inpt > no benefit  - Flutter valve helpful March 17, 2009  - Allergy profile April 12, 2010 >> IgE 30 unremarkable  - Dulera 200 added November 22, 2010  Could not tol thrush HYPERLIPIDEMIA (ICD-272.4)  - Target LDL < 70 due to prev TIA's  TRANSIENT ISCHEMIC ATTACKS, HX OF (ICD-V12.50).............................................Marland KitchenReynolds  OSTEOPOROSIS (ICD-733.00)  - Reclast January 2009, 2010, October 26, 2009 , 11/2010 , 11/3011 , due >01/10/12  - Bone Density 08/05/09 Spine 0.1 Left -2.2, Right -1.6  Health Maintenance...................................................................................................Marland KitchenWert  -Td 11/2003 -Pneumovax 11/04 and 05/2008 -Med calendar 12/24/08  And 03/01/11, 10/06/2011  -CPX 06/12/2013  -GYN health maint...........................................................................Marland Kitchen  Mody Asthma  Diverticulosis  - Colonoscopy 01/01/09.....................................................................Marland KitchenRussella Dar  GERD  Irritable Bowel Syndrome  Hemorrhoids  R Shoulder Pain..................................................................................[name]  Hypothyroid     Social History She  never married. No kids. Took care of her parents all her life  She has a gentlemen friend that she has been with since she was 37 but he lives in [place]  (He is now in his 90s but she never wanted to marry him).  Her mother died 07-20-11 She was a Engineer, water work.  Moved to Friend's home 2013              Objective:   Physical Exam  wt 105 January 07, 2009 > 114 November 19, 2009 >   03/12/2012 117 > 06/27/2012  111> 105 08/03/2012  >106 08/09/2012 > 105 09/10/2012 >  108 12/25/12>108 04/26/13 > 06/12/2013 104 >104 07/09/2013 > 08/07/2013 106 >107 09/16/2013  amb    elderly wf nad  Chronically ill appearing   HEENT: nl dentition, turbinates, and orophanx  Nasal clear drainage. ,max sinus tenderness  NECK : without JVD/Nodes/TM/ nl carotid upstrokes bilaterally  LUNGS: no acc muscle use, pan exp sonorous junky rhonchi   CV: RRR no s3 or murmur or increase in P2, no edema  ABD: soft and nontender with nl excursion in the supine position. No bruits or organomegaly, bowel sounds nl   MS: nl gait/  warm without deformities, calf tenderness, cyanosis or clubbing Neuro alert/approp/ no motor or cerebellar def      CXR  08/07/2013 :  COPD. Stable bibasilar nodular densities which may be saccular bronchiectasis with fluid-filled dilated airways. . CT chest suggested for  further evaluation.  CT chest 08/09/13 extensive cylindrical bronchiectasis w/ extensive mucoid impaction     Assessment:

## 2013-09-16 NOTE — Assessment & Plan Note (Signed)
Controlled on current regimen.   

## 2013-09-16 NOTE — Assessment & Plan Note (Signed)
No recent bronchiectasis exacerbation, now back to baseline. We did CT with no significant change in her chronic bronchiectasis. Patient's continue on her aggressive pulmonary hygiene regimen. Patient encouraged on her flutter valve.

## 2013-10-28 ENCOUNTER — Ambulatory Visit (INDEPENDENT_AMBULATORY_CARE_PROVIDER_SITE_OTHER): Payer: Medicare Other | Admitting: Internal Medicine

## 2013-10-28 ENCOUNTER — Encounter: Payer: Self-pay | Admitting: Internal Medicine

## 2013-10-28 VITALS — BP 144/80 | HR 90 | Temp 97.8°F | Ht 62.0 in | Wt 105.0 lb

## 2013-10-28 DIAGNOSIS — J9610 Chronic respiratory failure, unspecified whether with hypoxia or hypercapnia: Secondary | ICD-10-CM

## 2013-10-28 DIAGNOSIS — J479 Bronchiectasis, uncomplicated: Secondary | ICD-10-CM

## 2013-10-28 NOTE — Assessment & Plan Note (Signed)
-   Xolair attempted 6/04 -> 05/2004  - Immunotherapy stopped 8/09  - Started xyflo 8/09 > 9/09 no benefit  - VEST 2008 no benefit  - Flutter valve helpful March 17, 2009  - Allergy profile April 12, 2010 >> IgE 30 unremarkable  - Dulera 200 added November 22, 2010 > could not tolerate -Sputum cx 08/29/2011 +ESCHERICHIA COLI mixed sensitivities > flare tx w/ augmentin (d/t sulfa all)   -perfomist 02/17/2012 > no benefit > rechallenged  12/27/2012   I had an extended discussion with the patient today lasting 15 to 20 minutes of a 25 minute visit on the following issues: Not using med calendar action plan appropriately esp re use of prednisone which should be ceiling of 40 and a floor of 10 mg daily    Each maintenance medication was reviewed in detail including most importantly the difference between maintenance and as needed and under what circumstances the prns are to be used. This was done in the context of a medication calendar review which provided the patient with a user-friendly unambiguous mechanism for medication administration and reconciliation and provides an action plan for all active problems. It is critical that this be shown to every doctor  for modification during the office visit if necessary so the patient can use it as a working document.

## 2013-10-28 NOTE — Progress Notes (Signed)
Subjective:     Patient ID: [name], female   DOB: [birth year] .   MRN: [id number]   Brief patient profile:  63 yowf never smoker with documented chronic sinusitis /bronchiectasis and a tendency to recurrent purulent exacerbations, has been self titrating prednisone with a ceiling of 40 mg per day and a floor of 5 extensively evaluated at [facility] and by Dr Allena Katz with neg w/u x atopic.    HPI  12/19/2011 Follow up  Patient returns today for a followup visit. Patient has history of osteoporosis and has been on yearly Reclast infusions. She is due for her Reclast this month. Takes oscal d Twice daily   Over the last week. Complains of increased cough, congestion, with thick, green mucus. Has not filled her standing rx for omnicef. Currently on prednisone 10mg  daily  No hemoptysis , chest pain or edema.  >>Reclast rx and Omnicef rx x 10 d       04/26/2013 Acute OV  Complains of increased urinary frequency, with cloudiness x2 weeks.  Dyspnea is unchanged; denies dysurina, pressure, odor. No back pain, n/v, hematuria or fever.  No recent UTI . Over last 2 weeks more symptoms of urinary urgency and frequenty. Last few days, urine has strong odor and is dark and cloudy. No dysuria or discharge. Also BMD returned with worsening T score -3.2. Discussed continuing yearly Reclast. Advised to cont on calcium and vitamin d.  rec Cipro 250mg  Twice daily  For 7 days  Push fluids  Healthy urinary practices.  Return in couple of weeks for labs for Reclast> did not return   06/12/2013 f/u ov/Jaymie Mckiddy re: bronchiectasis/ hypothyroid/hbp/ yearly comprehensive  Chief Complaint  Patient presents with  . Annual Exam    Pt fasting. Pt c/o increased SOB over the past several months. Her cough is also worse and is prod with green sputum for the past 2 months. Taking pred 10 mg daily.   doxy rx > no better.  Can't tol vest.   >>labs w/ good cholesterol at goal , TSH elevated w/ increased dose of  synthroid rx   07/09/2013 Follow up  4 week follow up Bronchiectasis and discuss Reclast  Had recent flare of bronchiectasis that resolved with abx and return to her baseline.  Reports breathing is primarily unchanged since last ov  . Cough not quite as bad. No fever or hemoptysis.  Is due for Reclast. BMD w/ T score -3.2.  Lost her long term boyfriend last week, he passed away with dementia. They never married. Says she is dealing ok but is very sad, support given  She remains in [place] at [facility]  rec No change rx  08/07/2013 f/u ov/Kinslee Dalpe re: bronchiectasis / hypothyroid/hypertension/ osteo Chief Complaint  Patient presents with  . Acute Visit    Increased SOB,  cough w/ green mucus, wheezing and tightness x2 months    Just fiinshed a burst of pred, no recent abx,not really using her prns aggressively. Sob with more than room to room walking/ 02 dep  >CT chest showed 08/09/13 extensive cylindrical bronchiectasis w/ extensive mucoid impaction  rec No change rx/ ice pack to R shoulder prn  09/16/2013 Follow up  Pt returns ov for 6 week follow up for bronchiectasis.  Patient with recent bronchiectasis, flare, and treated with a course of prednisone, with her aggressive pulmonary hygiene with nebulizers. Patient reports she is slowly improving, however, to have a daily. Chronic cough. Patient had a CT chest last visit  showing extensive bronchiectasis with extensive mucoid impactions, which has been similar to her CTs in the past. Patient was also having some right shoulder blade pain. It has resolved.  rec  no change rx   10/28/2013 f/u ov/Zianne Schubring re:  Chief Complaint  Patient presents with  . Follow-up    Pt states that her cough and dyspnea are progressively worse since the last visit. Cough is prod with minimal green sputum.   walks from her room to cafeteria on 02 4lpm pulsed, 2lpm at home     No obvious day to day or daytime variabilty or assoc chronic cough  or cp or chest tightness, subjective wheeze overt sinus or hb symptoms. No unusual exp hx or h/o childhood pna/ asthma or knowledge of premature birth.  Sleeping ok without nocturnal  or early am exacerbation  of respiratory  c/o's or need for noct saba. Also denies any obvious fluctuation of symptoms with weather or environmental changes or other aggravating or alleviating factors except as outlined above   Current Medications, Allergies, Complete Past Medical History, Past Surgical History, Family History, and Social History were reviewed in [employer] record.  ROS  The following are not active complaints unless bolded sore throat, dysphagia, dental problems, itching, sneezing,  nasal congestion or excess/ purulent secretions, ear ache,   fever, chills, sweats, unintended wt loss, pleuritic or exertional cp, hemoptysis,  orthopnea pnd or leg swelling, presyncope, palpitations, heartburn, abdominal pain, anorexia, nausea, vomiting, diarrhea  or change in bowel or urinary habits, change in stools or urine, dysuria,hematuria,  rash, arthralgias, visual complaints, headache, numbness weakness or ataxia or problems with walking or coordination,  change in mood/affect or memory.         Allergies  1) ! Iodine  2) Sulfamethoxazole (Sulfamethoxazole)  3) Erythromycin Ethylsuccinate    Past Medical History:  HYPOTHYROIDISM (ICD-244.9)  BRONCHIECTASIS W/ACUTE EXACERBATION (ICD-494.1)  - Xolair attempted 6/04 -> 9/05 no benefit - Immunotherapy stopped 04/2008  - Started xyflo 8/09 > 05/2008 no benefit  - VEST 2008 as inpt > no benefit  - Flutter valve helpful March 17, 2009  - Allergy profile April 12, 2010 >> IgE 30 unremarkable  - Dulera 200 added November 22, 2010  Could not tol thrush HYPERLIPIDEMIA (ICD-272.4)  - Target LDL < 70 due to prev TIA's  TRANSIENT ISCHEMIC ATTACKS, HX OF (ICD-V12.50).............................................Marland KitchenReynolds  OSTEOPOROSIS  (ICD-733.00)  - Reclast January 2009, 2010, October 26, 2009 , 11/2010 , 11/3011 , due >01/10/12  - Bone Density 08/05/09 Spine 0.1 Left -2.2, Right -1.6  Health Maintenance...................................................................................................Marland KitchenWert  -Td 11/2003 -Pneumovax 11/04 and 05/2008 -Med calendar 12/24/08  And 03/01/11, 10/06/2011  -CPX 06/12/2013  -GYN health maint...........................................................................Marland Kitchen  Mody Asthma  Diverticulosis  - Colonoscopy 01/01/09.....................................................................Marland KitchenFuller Plan  GERD  Irritable Bowel Syndrome  Hemorrhoids  R Shoulder Pain..................................................................................Marland KitchenRed Hill  Hypothyroid     Social History She never married. No kids. Took care of her parents all her life  She has a gentlemen friend that she has been with since she was 67 but he lives in [place]  (He is now in his 46s but she never wanted to marry him).  Her mother died 2011/07/11 She was a Mudlogger work.  Moved to Friend's home 2013              Objective:   Physical Exam  wt 105 January 07, 2009 > 114 November 19, 2009 >   03/12/2012 117 > 06/27/2012  111> 105  08/03/2012  >106 08/09/2012 > 105 09/10/2012 >  108 12/25/12>108 04/26/13 > 06/12/2013 104 >104 07/09/2013 > 08/07/2013 106 >107 09/16/2013  > 10/28/2013  105  amb    elderly wf nad  Chronically ill appearing   HEENT: nl dentition, turbinates, and orophanx  Nasal clear drainage. ,max sinus tenderness  NECK : without JVD/Nodes/TM/ nl carotid upstrokes bilaterally  LUNGS: no acc muscle use, pan exp sonorous junky rhonchi bilaterally  CV: RRR no s3 or murmur or increase in P2, no edema  ABD: soft and nontender with nl excursion in the supine position. No bruits or organomegaly, bowel sounds nl   MS: nl gait/  warm without deformities, calf tenderness, cyanosis or  clubbing Neuro alert/approp/ no motor or cerebellar def      CXR  08/07/2013 :  COPD. Stable bibasilar nodular densities which may be saccular bronchiectasis with fluid-filled dilated airways. . CT chest suggested for further evaluation.  CT chest 08/09/13 extensive cylindrical bronchiectasis w/ extensive mucoid impaction     Assessment:

## 2013-10-28 NOTE — Assessment & Plan Note (Signed)
Patient Saturations on Room Air at Rest = 85%  12-19-11     - 02/17/2012  Walked 3lpm pulsed  x 1 laps @ 185 ft each stopped due to sob and sat 89% - 10/28/2013 on 4lpm pusled desat's walking 10 ft > changed on 4lpm continuous s desats       -rx =  2lpm at hs and with activity within room,  4lpm continuous with activity outside the room (no pulsed)

## 2013-10-28 NOTE — Patient Instructions (Signed)
Prednisone is to be increased up to 40 mg per day as per med calendar for short of breath or wheeze until better then taper to a floor of 10 mg daily  Please schedule a follow up visit in 3 months but call sooner if needed to see Tammy no return

## 2013-11-07 ENCOUNTER — Encounter: Payer: Self-pay | Admitting: Gastroenterology

## 2013-11-25 ENCOUNTER — Encounter: Payer: Self-pay | Admitting: Gastroenterology

## 2013-12-11 ENCOUNTER — Other Ambulatory Visit: Payer: Self-pay | Admitting: Internal Medicine

## 2013-12-26 ENCOUNTER — Telehealth: Payer: Self-pay | Admitting: *Deleted

## 2013-12-26 NOTE — Telephone Encounter (Signed)
Per pt's chart, she takes Plavix.  I attempted to reach her by both mobile and home phones at 1230 and 1400- no answer either time

## 2013-12-30 ENCOUNTER — Ambulatory Visit (AMBULATORY_SURGERY_CENTER): Payer: Medicare Other | Admitting: *Deleted

## 2013-12-30 DIAGNOSIS — Z86010 Personal history of colonic polyps: Secondary | ICD-10-CM

## 2013-12-30 NOTE — Progress Notes (Signed)
Pt is on home oxygen and Plavix.  I explained that she would need an OV first.  Understanding voiced.  Colonoscopy for 01-13-14 cancelled   No new pt appts available on the available schedule.  I told pt that we would call her as soon as new schedule made.  Lenon Ahmadi aware and will make appt for pt

## 2014-01-09 ENCOUNTER — Other Ambulatory Visit: Payer: Self-pay | Admitting: Internal Medicine

## 2014-01-13 ENCOUNTER — Encounter: Payer: Medicare Other | Admitting: Gastroenterology

## 2014-01-24 NOTE — Addendum Note (Signed)
Addended by: Lowry Ram on: 01/24/2014 03:26 PM   Modules accepted: Level of Service

## 2014-01-27 ENCOUNTER — Ambulatory Visit (INDEPENDENT_AMBULATORY_CARE_PROVIDER_SITE_OTHER): Payer: Medicare Other | Admitting: Adult Health

## 2014-01-27 ENCOUNTER — Encounter: Payer: Self-pay | Admitting: Adult Health

## 2014-01-27 VITALS — BP 106/58 | HR 88 | Temp 97.9°F | Ht 62.0 in | Wt 105.0 lb

## 2014-01-27 DIAGNOSIS — D72829 Elevated white blood cell count, unspecified: Secondary | ICD-10-CM

## 2014-01-27 DIAGNOSIS — J479 Bronchiectasis, uncomplicated: Secondary | ICD-10-CM

## 2014-01-27 DIAGNOSIS — J9610 Chronic respiratory failure, unspecified whether with hypoxia or hypercapnia: Secondary | ICD-10-CM

## 2014-01-27 NOTE — Assessment & Plan Note (Signed)
Needs continous flow   Plan Wear Oxygen 3 l/m at rest and 4l/m continuous flow walking  Mucinex  Twice daily  As needed  Cough/congestion  Along w/ flutter valve  Continue on current regimen.   Please contact office for sooner follow up if symptoms do not improve or worsen or seek emergency care

## 2014-01-27 NOTE — Assessment & Plan Note (Signed)
Recent flare now resolving  Advised on need to keep O2 on continuous flow   Plan  Wear Oxygen 3 l/m at rest and 4l/m continuous flow walking  Mucinex  Twice daily  As needed  Cough/congestion  Along w/ flutter valve  Continue on current regimen.   Please contact office for sooner follow up if symptoms do not improve or worsen or seek emergency care

## 2014-01-27 NOTE — Patient Instructions (Addendum)
Wear Oxygen 3 l/m at rest and 4l/m continuous flow walking  Mucinex  Twice daily  As needed  Cough/congestion  Along w/ flutter valve  Continue on current regimen.   Please contact office for sooner follow up if symptoms do not improve or worsen or seek emergency care

## 2014-01-27 NOTE — Progress Notes (Signed)
Subjective:     Patient ID: [name], female   DOB: [birth year] .   MRN: [id number]   Brief patient profile:  13 yowf never smoker with documented chronic sinusitis /bronchiectasis and a tendency to recurrent purulent exacerbations, has been self titrating prednisone with a ceiling of 40 mg per day and a floor of 5 extensively evaluated at [facility] and by Dr Allena Katz with neg w/u x atopic.   HPI  12/19/2011 Follow up  Patient returns today for a followup visit. Patient has history of osteoporosis and has been on yearly Reclast infusions. She is due for her Reclast this month. Takes oscal d Twice daily   Over the last week. Complains of increased cough, congestion, with thick, green mucus. Has not filled her standing rx for omnicef. Currently on prednisone 10mg  daily  No hemoptysis , chest pain or edema.  >>Reclast rx and Omnicef rx x 10 d       04/26/2013 Acute OV  Complains of increased urinary frequency, with cloudiness x2 weeks.  Dyspnea is unchanged; denies dysurina, pressure, odor. No back pain, n/v, hematuria or fever.  No recent UTI . Over last 2 weeks more symptoms of urinary urgency and frequenty. Last few days, urine has strong odor and is dark and cloudy. No dysuria or discharge. Also BMD returned with worsening T score -3.2. Discussed continuing yearly Reclast. Advised to cont on calcium and vitamin d.  rec Cipro 250mg  Twice daily  For 7 days  Push fluids  Healthy urinary practices.  Return in couple of weeks for labs for Reclast> did not return   06/12/2013 f/u ov/[name] re: bronchiectasis/ hypothyroid/hbp/ yearly comprehensive  Chief Complaint  Patient presents with  . Annual Exam    Pt fasting. Pt c/o increased SOB over the past several months. Her cough is also worse and is prod with green sputum for the past 2 months. Taking pred 10 mg daily.   doxy rx > no better.  Can't tol vest.   >>labs w/ good cholesterol at goal , TSH elevated w/ increased dose of  synthroid rx   07/09/2013 Follow up  4 week follow up Bronchiectasis and discuss Reclast  Had recent flare of bronchiectasis that resolved with abx and return to her baseline.  Reports breathing is primarily unchanged since last ov  . Cough not quite as bad. No fever or hemoptysis.  Is due for Reclast. BMD w/ T score -3.2.  Lost her long term boyfriend last week, he passed away with dementia. They never married. Says she is dealing ok but is very sad, support given  She remains in [place] at [facility].  rec No change rx  08/07/2013 f/u ov/[name] re: bronchiectasis / hypothyroid/hypertension/ osteo Chief Complaint  Patient presents with  . Acute Visit    Increased SOB,  cough w/ green mucus, wheezing and tightness x2 months    Just fiinshed a burst of pred, no recent abx,not really using her prns aggressively. Sob with more than room to room walking/ 02 dep  >CT chest showed 08/09/13 extensive cylindrical bronchiectasis w/ extensive mucoid impaction  rec No change rx/ ice pack to R shoulder prn  09/16/2013 Follow up  Pt returns ov for 6 week follow up for bronchiectasis.  Patient with recent bronchiectasis, flare, and treated with a course of prednisone, with her aggressive pulmonary hygiene with nebulizers. Patient reports she is slowly improving, however, to have a daily. Chronic cough. Patient had a CT chest last visit showing  extensive bronchiectasis with extensive mucoid impactions, which has been similar to her CTs in the past. Patient was also having some right shoulder blade pain. It has resolved.  rec  no change rx   10/28/2013 f/u ov/[name] re:  Chief Complaint  Patient presents with  . Follow-up    Pt states that her cough and dyspnea are progressively worse since the last visit. Cough is prod with minimal green sputum.   walks from her room to cafeteria on 02 4lpm pulsed, 2lpm at home   >>no changes   01/27/2014 Follow up  Returns for  3 month follow  up Reports dyspnea with walking is getting worse. Wears out easily . Was suppose to use Oxygen 4ll/m continuous flow from last ov however on pulsing device on arrival with O2 sats 79-84%.  We discussed using her walker with basket to carry O2 tank.  Had increase cough and wheezing last week -did pred taper, now back to 10mg  daily .  No fever, increased discolored mucus, abd pain, n/v/d, or edema, or chest pain.   Current Medications, Allergies, Complete Past Medical History, Past Surgical History, Family History, and Social History were reviewed in [employer] record.  ROS  The following are not active complaints unless bolded sore throat, dysphagia, dental problems, itching, sneezing,  nasal congestion or excess/ purulent secretions, ear ache,   fever, chills, sweats, unintended wt loss, pleuritic or exertional cp, hemoptysis,  orthopnea pnd or leg swelling, presyncope, palpitations, heartburn, abdominal pain, anorexia, nausea, vomiting, diarrhea  or change in bowel or urinary habits, change in stools or urine, dysuria,hematuria,  rash, arthralgias, visual complaints, headache, numbness weakness or ataxia or problems with walking or coordination,  change in mood/affect or memory.         Allergies  1) ! Iodine  2) Sulfamethoxazole (Sulfamethoxazole)  3) Erythromycin Ethylsuccinate    Past Medical History:  HYPOTHYROIDISM (ICD-244.9)  BRONCHIECTASIS W/ACUTE EXACERBATION (ICD-494.1)  - Xolair attempted 6/04 -> 9/05 no benefit - Immunotherapy stopped 04/2008  - Started xyflo 8/09 > 05/2008 no benefit  - VEST 2008 as inpt > no benefit  - Flutter valve helpful March 17, 2009  - Allergy profile April 12, 2010 >> IgE 30 unremarkable  - Dulera 200 added November 22, 2010  Could not tol thrush HYPERLIPIDEMIA (ICD-272.4)  - Target LDL < 70 due to prev TIA's  TRANSIENT ISCHEMIC ATTACKS, HX OF (ICD-V12.50).............................................Marland KitchenReynolds  OSTEOPOROSIS  (ICD-733.00)  - Reclast January 2009, 2010, October 26, 2009 , 11/2010 , 11/3011 , due >01/10/12  - Bone Density 08/05/09 Spine 0.1 Left -2.2, Right -1.6  Health Maintenance...................................................................................................Marland KitchenWert  -Td 11/2003 -Pneumovax 11/04 and 05/2008 -Med calendar 12/24/08  And 03/01/11, 10/06/2011  -CPX 06/12/2013  -GYN health maint...........................................................................Marland Kitchen  Mody Asthma  Diverticulosis  - Colonoscopy 01/01/09.....................................................................Marland KitchenFuller Plan  GERD  Irritable Bowel Syndrome  Hemorrhoids  R Shoulder Pain..................................................................................Marland KitchenFulton  Hypothyroid     Social History She never married. No kids. Took care of her parents all her life  She has a gentlemen friend that she has been with since she was 28 but he lives in [place]  (He is now in his 77s but she never wanted to marry him).  Her mother died Jul 28, 2011 She was a Mudlogger work.  Moved to Friend[facility] home 2013              Objective:   Physical Exam  wt 105 January 07, 2009 > 114 November 19, 2009 >  03/12/2012 117 > 06/27/2012  111> 105 08/03/2012  >106 08/09/2012 > 105 09/10/2012 >  108 12/25/12>108 04/26/13 > 06/12/2013 104 >104 07/09/2013 > 08/07/2013 106 >107 09/16/2013  > 10/28/2013  105 >105 01/27/2014  amb    elderly wf nad  Chronically ill appearing   HEENT: nl dentition, turbinates, and orophanx  Nasal clear drainage. ,max sinus tenderness  NECK : without JVD/Nodes/TM/ nl carotid upstrokes bilaterally  LUNGS: no acc muscle use, pan exp sonorous junky rhonchi bilaterally  CV: RRR no s3 or murmur or increase in P2, no edema  ABD: soft and nontender with nl excursion in the supine position. No bruits or organomegaly, bowel sounds nl   MS: nl gait/  warm without deformities, calf tenderness,  cyanosis or clubbing Neuro alert/approp/ no motor or cerebellar def      CXR  08/07/2013 :  COPD. Stable bibasilar nodular densities which may be saccular bronchiectasis with fluid-filled dilated airways. . CT chest suggested for further evaluation.  CT chest 08/09/13 extensive cylindrical bronchiectasis w/ extensive mucoid impaction     Assessment:

## 2014-01-27 NOTE — Assessment & Plan Note (Signed)
Repeat cbc

## 2014-02-10 ENCOUNTER — Ambulatory Visit (INDEPENDENT_AMBULATORY_CARE_PROVIDER_SITE_OTHER): Payer: Medicare Other | Admitting: Internal Medicine

## 2014-02-10 ENCOUNTER — Encounter: Payer: Self-pay | Admitting: Internal Medicine

## 2014-02-10 VITALS — BP 144/66 | HR 86 | Temp 98.0°F | Ht 62.0 in | Wt 104.0 lb

## 2014-02-10 DIAGNOSIS — Z23 Encounter for immunization: Secondary | ICD-10-CM

## 2014-02-10 DIAGNOSIS — J479 Bronchiectasis, uncomplicated: Secondary | ICD-10-CM

## 2014-02-10 DIAGNOSIS — J9610 Chronic respiratory failure, unspecified whether with hypoxia or hypercapnia: Secondary | ICD-10-CM

## 2014-02-10 NOTE — Progress Notes (Signed)
Subjective:     Patient ID: [name], female   DOB: 1933/04/27 .   MRN: [id number]   Brief patient profile:  5 yowf never smoker with documented chronic sinusitis /bronchiectasis and a tendency to recurrent purulent exacerbations, has been self titrating prednisone with a ceiling of 40 mg per day and a floor of 5 extensively evaluated at [facility] and by Dr Allena Katz with neg w/u x atopic.   HPI  12/19/2011 Follow up  Patient returns today for a followup visit. Patient has history of osteoporosis and has been on yearly Reclast infusions. She is due for her Reclast this month. Takes oscal d Twice daily   Over the last week. Complains of increased cough, congestion, with thick, green mucus. Has not filled her standing rx for omnicef. Currently on prednisone 10mg  daily  No hemoptysis , chest pain or edema.  >>Reclast rx and Omnicef rx x 10 d       04/26/2013 Acute OV  Complains of increased urinary frequency, with cloudiness x2 weeks.  Dyspnea is unchanged; denies dysurina, pressure, odor. No back pain, n/v, hematuria or fever.  No recent UTI . Over last 2 weeks more symptoms of urinary urgency and frequenty. Last few days, urine has strong odor and is dark and cloudy. No dysuria or discharge. Also BMD returned with worsening T score -3.2. Discussed continuing yearly Reclast. Advised to cont on calcium and vitamin d.  rec Cipro 250mg  Twice daily  For 7 days  Push fluids  Healthy urinary practices.  Return in couple of weeks for labs for Reclast> did not return   06/12/2013 f/u ov/[name] re: bronchiectasis/ hypothyroid/hbp/ yearly comprehensive  Chief Complaint  Patient presents with  . Annual Exam    Pt fasting. Pt c/o increased SOB over the past several months. Her cough is also worse and is prod with green sputum for the past 2 months. Taking pred 10 mg daily.   doxy rx > no better.  Can't tol vest.   >>labs w/ good cholesterol at goal , TSH elevated w/ increased dose of  synthroid rx   07/09/2013 Follow up  4 week follow up Bronchiectasis and discuss Reclast  Had recent flare of bronchiectasis that resolved with abx and return to her baseline.  Reports breathing is primarily unchanged since last ov  . Cough not quite as bad. No fever or hemoptysis.  Is due for Reclast. BMD w/ T score -3.2.  Lost her long term boyfriend last week, he passed away with dementia. They never married. Says she is dealing ok but is very sad, support given  She remains in [place] at [facility].  rec No change rx  08/07/2013 f/u ov/[name] re: bronchiectasis / hypothyroid/hypertension/ osteo Chief Complaint  Patient presents with  . Acute Visit    Increased SOB,  cough w/ green mucus, wheezing and tightness x2 months    Just fiinshed a burst of pred, no recent abx,not really using her prns aggressively. Sob with more than room to room walking/ 02 dep  >CT chest showed 08/09/13 extensive cylindrical bronchiectasis w/ extensive mucoid impaction  rec No change rx/ ice pack to R shoulder prn  09/16/2013 Follow up  Pt returns ov for 6 week follow up for bronchiectasis.  Patient with recent bronchiectasis, flare, and treated with a course of prednisone, with her aggressive pulmonary hygiene with nebulizers. Patient reports she is slowly improving, however, to have a daily. Chronic cough. Patient had a CT chest last visit showing  extensive bronchiectasis with extensive mucoid impactions, which has been similar to her CTs in the past. Patient was also having some right shoulder blade pain. It has resolved.  rec  no change rx   10/28/2013 f/u ov/[name] re:  Chief Complaint  Patient presents with  . Follow-up    Pt states that her cough and dyspnea are progressively worse since the last visit. Cough is prod with minimal green sputum.   walks from her room to cafeteria on 02 4lpm pulsed, 2lpm at home   >>no changes   01/27/2014 Follow up  Returns for  3 month follow  up Reports dyspnea with walking is getting worse. Wears out easily . Was suppose to use Oxygen 4ll/m continuous flow from last ov however on pulsing device on arrival with O2 sats 79-84%.  We discussed using her walker with basket to carry O2 tank.  Had increase cough and wheezing last week -did pred taper, now back to 10mg  daily .  No fever, increased discolored mucus, abd pain, n/v/d, or edema, or chest pain.  rec Wear Oxygen 3 l/m at rest and 4l/m continuous flow walking  Mucinex  Twice daily  As needed  Cough/congestion  Along w/ flutter valve    02/10/2014 f/u ov/[name] re: ab/bronchiectasis/ has med calendar but not using action plan Chief Complaint  Patient presents with  . Follow-up    Pt states that her breathing is progressively worse since the last visit. She is currently on 40 mg prednisone. She c/o prod cough with large amounts of yellow sputum x 1 wk.    not using augmentin, nasal steroids, afrin as per calendar and having more purulent sputum and nasal congestion. Comfortable at rest p saba but only using once or twice a day  No obvious day to day or daytime variabilty or assoc chronic cough or cp or chest tightness, subjective wheeze overt   hb symptoms. No unusual exp hx or h/o childhood pna/ asthma or knowledge of premature birth.  Sleeping ok without nocturnal  or early am exacerbation  of respiratory  c/o's or need for noct saba. Also denies any obvious fluctuation of symptoms with weather or environmental changes or other aggravating or alleviating factors except as outlined above   Current Medications, Allergies, Complete Past Medical History, Past Surgical History, Family History, and Social History were reviewed in [employer] record.  ROS  The following are not active complaints unless bolded sore throat, dysphagia, dental problems, itching, sneezing,  nasal congestion or excess/ purulent secretions, ear ache,   fever, chills, sweats, unintended wt  loss, pleuritic or exertional cp, hemoptysis,  orthopnea pnd or leg swelling, presyncope, palpitations, heartburn, abdominal pain, anorexia, nausea, vomiting, diarrhea  or change in bowel or urinary habits, change in stools or urine, dysuria,hematuria,  rash, arthralgias, visual complaints, headache, numbness weakness or ataxia or problems with walking or coordination,  change in mood/affect or memory.          .         Allergies  1) ! Iodine  2) Sulfamethoxazole (Sulfamethoxazole)  3) Erythromycin Ethylsuccinate    Past Medical History:  HYPOTHYROIDISM (ICD-244.9)  BRONCHIECTASIS W/ACUTE EXACERBATION (ICD-494.1)  - Xolair attempted 6/04 -> 9/05 no benefit - Immunotherapy stopped 04/2008  - Started xyflo 8/09 > 05/2008 no benefit  - VEST 2008 as inpt > no benefit  - Flutter valve helpful March 17, 2009  - Allergy profile April 12, 2010 >> IgE 30 unremarkable  - Dulera 200  added November 22, 2010  Could not tol thrush HYPERLIPIDEMIA (ICD-272.4)  - Target LDL < 70 due to prev TIA's  TRANSIENT ISCHEMIC ATTACKS, HX OF (ICD-V12.50).............................................Marland KitchenReynolds  OSTEOPOROSIS (ICD-733.00)  - Reclast January 2009, 2010, October 26, 2009 , 11/2010 , 11/3011 , due >01/10/12  - Bone Density 08/05/09 Spine 0.1 Left -2.2, Right -1.6  Health Maintenance...................................................................................................Marland KitchenWert  -Td 11/2003 -Pneumovax 11/04 and 05/2008 -Med calendar 12/24/08  And 03/01/11, 10/06/2011  -CPX 06/12/2013  -GYN health maint...........................................................................Marland Kitchen  Mody Asthma  Diverticulosis  - Colonoscopy 01/01/09.....................................................................Marland KitchenFuller Plan  GERD  Irritable Bowel Syndrome  Hemorrhoids  R Shoulder Pain..................................................................................Marland KitchenGl[place]  Hypothyroid     Social History She  never married. No kids. Took care of her parents all her life  She has a gentlemen friend that she has been with since she was 66 but he lives in [place]  (He is now in his 27s but she never wanted to marry him).  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 ,max sinus tenderness  NECK : without JVD/Nodes/TM/ nl carotid upstrokes bilaterally  LUNGS: no acc muscle use, pan exp sonorous junky rhonchi bilaterally  CV: RRR no s3 or murmur or increase in P2, no edema  ABD: soft and nontender with nl excursion in the supine position. No bruits or organomegaly, bowel sounds nl   MS: nl gait/  warm without deformities, calf tenderness, cyanosis or clubbing Neuro alert/approp/ no motor or cerebellar def      CXR  08/07/2013 :  COPD. Stable bibasilar nodular densities which may be saccular bronchiectasis with fluid-filled dilated airways. . CT chest suggested for further evaluation.  CT chest 08/09/13 extensive cylindrical bronchiectasis w/ extensive mucoid impaction     Assessment:

## 2014-02-10 NOTE — Assessment & Plan Note (Signed)
-   Xolair attempted 6/04 -> 05/2004  - Immunotherapy stopped 8/09  - Started xyflo 8/09 > 9/09 no benefit  - VEST 2008 no benefit  - Flutter valve helpful March 17, 2009  - Allergy profile April 12, 2010 >> IgE 30 unremarkable  - Dulera 200 added November 22, 2010 > could not tolerate -Sputum cx 08/29/2011 +ESCHERICHIA COLI mixed sensitivities > flare tx w/ augmentin (d/t sulfa all)   -perfomist 02/17/2012 > no benefit > rechallenged  12/27/2012  - Prevnar given 02/10/2014   I had an extended discussion with the patient today lasting 15 to 20 minutes of a 25 minute visit on the following issues:    Each maintenance medication was reviewed in detail including most importantly the difference between maintenance and as needed and under what circumstances the prns are to be used. This was done in the context of a medication calendar review which provided the patient with a user-friendly unambiguous mechanism for medication administration and reconciliation and provides an action plan for all active problems. It is critical that this be shown to every doctor  for modification during the office visit if necessary so the patient can use it as a working document.      See instructions for specific recommendations which were reviewed directly with the patient who was given a copy with highlighter outlining the key components.

## 2014-02-10 NOTE — Addendum Note (Signed)
Addended by: Rosana Berger on: 02/10/2014 02:36 PM   Modules accepted: Orders

## 2014-02-10 NOTE — Patient Instructions (Addendum)
Not using action plan as intended.   See calendar for specific medication instructions and bring it back for each and every office visit for every healthcare provider you see.  Without it,  you may not receive the best quality medical care that we feel you deserve.  You will note that the calendar groups together  your maintenance  medications that are timed at particular times of the day.  Think of this as your checklist for what your doctor has instructed you to do until your next evaluation to see what benefit  there is  to staying on a consistent group of medications intended to keep you well.  The other group at the bottom is entirely up to you to use as you see fit  for specific symptoms that may arise between visits that require you to treat them on an as needed basis.  Think of this as your action plan or "what if" list.   Separating the top medications from the bottom group is fundamental to providing you adequate care going forward.    See [name] in 6  weeks with all your medications, even over the counter meds, separated in two separate bags, the ones you take no matter what vs the ones you stop once you feel better and take only as needed when you feel you need them.   Tammy  will generate for you a new user friendly medication calendar that will put [place] all on the same page re: your medication use.     Without this process, it simply isn't possible to assure that we are providing  your outpatient care  with  the attention to detail we feel you deserve.   If we cannot assure that you're getting that kind of care,  then we cannot manage your problem effectively from this clinic.  Once you have seen Tammy and we are sure that we're all on the same page with your medication use she will arrange follow up with me.

## 2014-02-10 NOTE — Assessment & Plan Note (Signed)
Patient Saturations on Room Air at Rest = 85%  12-19-11     - 02/17/2012  Walked 3lpm pulsed  x 1 laps @ 185 ft each stopped due to sob and sat 89% - 10/28/2013 on 4lpm pusled desat's walking 10 ft > changed on 4lpm continuous s desats       -rx =  3lpm at hs and with activity within room,  4lpm continuous with activity outside the room (no pulsed)   Adequate control on present rx, reviewed > no change in rx needed   

## 2014-03-04 ENCOUNTER — Encounter: Payer: Self-pay | Admitting: Gastroenterology

## 2014-03-04 ENCOUNTER — Ambulatory Visit (INDEPENDENT_AMBULATORY_CARE_PROVIDER_SITE_OTHER): Payer: Medicare Other | Admitting: Gastroenterology

## 2014-03-04 VITALS — BP 150/76 | HR 57 | Ht 61.0 in | Wt 105.0 lb

## 2014-03-04 DIAGNOSIS — J479 Bronchiectasis, uncomplicated: Secondary | ICD-10-CM

## 2014-03-04 DIAGNOSIS — Z86010 Personal history of colonic polyps: Secondary | ICD-10-CM

## 2014-03-04 NOTE — Patient Instructions (Signed)
Thank you for choosing me and [facility].  Malcolm T. Stark, Jr., MD., FACG  

## 2014-03-04 NOTE — Progress Notes (Signed)
    History of Present Illness: This is a 80 year old female with bronchiectasis maintained on continuous oxygen. Oxygen rate was recently increased. She has a history of adenomatous colon polyps. Her last colonoscopy was in April 2010 were too small adenomatous colon polyps were removed. She has no gastrointestinal complaints. Denies weight loss, abdominal pain, constipation, diarrhea, change in stool caliber, melena, hematochezia, nausea, vomiting, dysphagia, reflux symptoms, chest pain.  Review of Systems: Pertinent positive and negative review of systems were noted in the above HPI section. All other review of systems were otherwise negative.  Current Medications, Allergies, Past Medical History, Past Surgical History, Family History and Social History were reviewed in [employer] record.  Physical Exam: General: Well developed , well nourished, chronically ill-appearing, O2 by nasal cannula, no acute distress Head: Normocephalic and atraumatic Eyes:  sclerae anicteric, EOMI Ears: Normal auditory acuity Mouth: No deformity or lesions Neck: Supple, no masses or thyromegaly Lungs: Bilateral decreased air movement with wheezes and rhonchi  Heart: Regular rate and rhythm; no murmurs, rubs or bruits Abdomen: Soft, non tender and non distended. No masses, hepatosplenomegaly or hernias noted. Normal Bowel sounds Musculoskeletal: Symmetrical with no gross deformities  Skin: No lesions on visible extremities Pulses:  Normal pulses noted Extremities: No clubbing, cyanosis, edema or deformities noted Neurological: Alert oriented x 4, grossly nonfocal Cervical Nodes:  No significant cervical adenopathy Inguinal Nodes: No significant inguinal adenopathy Psychological:  Alert and cooperative. Normal mood and affect  Assessment and Recommendations:  1. Bronchiectasis maintained on continuous home oxygen. Personal history of adenomatous colon polyps. Given her age, significant  pulmonary comorbidity and only 2 small polyps on her last colonoscopy I feel that the risks of a surveillance colonoscopy outweigh the benefits. The patient is in full agreement. We will not plan for future surveillance for screening colonoscopies. GI followup when necessary.

## 2014-03-05 ENCOUNTER — Other Ambulatory Visit: Payer: Self-pay | Admitting: Internal Medicine

## 2014-03-11 ENCOUNTER — Encounter: Payer: Self-pay | Admitting: Internal Medicine

## 2014-03-11 ENCOUNTER — Ambulatory Visit (INDEPENDENT_AMBULATORY_CARE_PROVIDER_SITE_OTHER): Payer: Medicare Other | Admitting: Internal Medicine

## 2014-03-11 VITALS — BP 160/64 | HR 89 | Temp 98.5°F | Ht 61.0 in | Wt 106.2 lb

## 2014-03-11 DIAGNOSIS — J9610 Chronic respiratory failure, unspecified whether with hypoxia or hypercapnia: Secondary | ICD-10-CM

## 2014-03-11 DIAGNOSIS — J479 Bronchiectasis, uncomplicated: Secondary | ICD-10-CM

## 2014-03-11 NOTE — Progress Notes (Signed)
Subjective:     Patient ID: [name], female   DOB: 07-Dec-1932 .   MRN: [id number]   Brief patient profile:  77 yowf never smoker with documented chronic sinusitis /bronchiectasis and a tendency to recurrent purulent exacerbations, has been self titrating prednisone with a ceiling of 40 mg per day and a floor of 5 extensively evaluated at [facility] and by Dr Allena Katz with neg w/u x atopic.    History of Present Illness  12/19/2011 Follow up  Patient returns today for a followup visit. Patient has history of osteoporosis and has been on yearly Reclast infusions. She is due for her Reclast this month. Takes oscal d Twice daily   Over the last week. Complains of increased cough, congestion, with thick, green mucus. Has not filled her standing rx for omnicef. Currently on prednisone 10mg  daily  No hemoptysis , chest pain or edema.  >>Reclast rx and Omnicef rx x 10 d       04/26/2013 Acute OV  Complains of increased urinary frequency, with cloudiness x2 weeks.  Dyspnea is unchanged; denies dysurina, pressure, odor. No back pain, n/v, hematuria or fever.  No recent UTI . Over last 2 weeks more symptoms of urinary urgency and frequenty. Last few days, urine has strong odor and is dark and cloudy. No dysuria or discharge. Also BMD returned with worsening T score -3.2. Discussed continuing yearly Reclast. Advised to cont on calcium and vitamin d.  rec Cipro 250mg  Twice daily  For 7 days  Push fluids  Healthy urinary practices.  Return in couple of weeks for labs for Reclast> did not return   06/12/2013 f/u ov/[name] re: bronchiectasis/ hypothyroid/hbp/ yearly comprehensive  Chief Complaint  Patient presents with  . Annual Exam    Pt fasting. Pt c/o increased SOB over the past several months. Her cough is also worse and is prod with green sputum for the past 2 months. Taking pred 10 mg daily.   doxy rx > no better.  Can't tol vest.   >>labs w/ good cholesterol at goal , TSH elevated w/  increased dose of synthroid rx   07/09/2013 Follow up  4 week follow up Bronchiectasis and discuss Reclast  Had recent flare of bronchiectasis that resolved with abx and return to her baseline.  Reports breathing is primarily unchanged since last ov  . Cough not quite as bad. No fever or hemoptysis.  Is due for Reclast. BMD w/ T score -3.2.  Lost her long term boyfriend last week, he passed away with dementia. They never married. Says she is dealing ok but is very sad, support given  She remains in [place] at [facility].  rec No change rx  08/07/2013 f/u ov/[name] re: bronchiectasis / hypothyroid/hypertension/ osteo Chief Complaint  Patient presents with  . Acute Visit    Increased SOB,  cough w/ green mucus, wheezing and tightness x2 months    Just fiinshed a burst of pred, no recent abx,not really using her prns aggressively. Sob with more than room to room walking/ 02 dep  >CT chest showed 08/09/13 extensive cylindrical bronchiectasis w/ extensive mucoid impaction  rec No change rx/ ice pack to R shoulder prn  09/16/2013 Follow up  Pt returns ov for 6 week follow up for bronchiectasis.  Patient with recent bronchiectasis, flare, and treated with a course of prednisone, with her aggressive pulmonary hygiene with nebulizers. Patient reports she is slowly improving, however, to have a daily. Chronic cough. Patient had a CT  chest last visit showing extensive bronchiectasis with extensive mucoid impactions, which has been similar to her CTs in the past. Patient was also having some right shoulder blade pain. It has resolved.  rec  no change rx   10/28/2013 f/u ov/[name] re:  Chief Complaint  Patient presents with  . Follow-up    Pt states that her cough and dyspnea are progressively worse since the last visit. Cough is prod with minimal green sputum.   walks from her room to cafeteria on 02 4lpm pulsed, 2lpm at home   >>no changes   01/27/2014 Follow up  Returns for  3  month follow up Reports dyspnea with walking is getting worse. Wears out easily . Was suppose to use Oxygen 4ll/m continuous flow from last ov however on pulsing device on arrival with O2 sats 79-84%.  We discussed using her walker with basket to carry O2 tank.  Had increase cough and wheezing last week -did pred taper, now back to 10mg  daily .  No fever, increased discolored mucus, abd pain, n/v/d, or edema, or chest pain.  rec Wear Oxygen 3 l/m at rest and 4l/m continuous flow walking  Mucinex  Twice daily  As needed  Cough/congestion  Along w/ flutter valve    02/10/2014 f/u ov/[name] re: ab/bronchiectasis/ has med calendar but not using action plan Chief Complaint  Patient presents with  . Follow-up    Pt states that her breathing is progressively worse since the last visit. She is currently on 40 mg prednisone. She c/o prod cough with large amounts of yellow sputum x 1 wk.    not using augmentin, nasal steroids, afrin as per calendar and having more purulent sputum and nasal congestion. Comfortable at rest p saba but only using once or twice a day rec Follow the med calendar  03/11/2014 f/u ov/[name] re: prednisone at 10 baseline, when feeling fine no need for albuterol while on perforimist bid Chief Complaint  Patient presents with  . Acute Visit    Pt c/o increased DOE for the past 5 days. She states that she gets out of breath just walking across the room.  She also c/o increased cough with large amounts of yellow sputum and wheezing. She has been using albuterol neb at least twice per day and as of today increased pred to 40 mg.   worse x 5 days, just increased the prednisone one day prior to OV, has not yet started omnaris or augmentin from her action plans.  No sob at rest   No obvious day to day or daytime variabilty or assoc  cp or chest tightness  overt   hb symptoms. No unusual exp hx or h/o childhood pna/ asthma or knowledge of premature birth.  Sleeping ok without nocturnal  or  early am exacerbation  of respiratory  c/o's or need for noct saba. Also denies any obvious fluctuation of symptoms with weather or environmental changes or other aggravating or alleviating factors except as outlined above   Current Medications, Allergies, Complete Past Medical History, Past Surgical History, Family History, and Social History were reviewed in [employer] record.  ROS  The following are not active complaints unless bolded sore throat, dysphagia, dental problems, itching, sneezing,  nasal congestion or excess/ purulent secretions, ear ache,   fever, chills, sweats, unintended wt loss, pleuritic or exertional cp, hemoptysis,  orthopnea pnd or leg swelling, presyncope, palpitations, heartburn, abdominal pain, anorexia, nausea, vomiting, diarrhea  or change in bowel or urinary habits,  change in stools or urine, dysuria,hematuria,  rash, arthralgias, visual complaints, headache, numbness weakness or ataxia or problems with walking or coordination,  change in mood/affect or memory.          .         Allergies  1) ! Iodine  2) Sulfamethoxazole (Sulfamethoxazole)  3) Erythromycin Ethylsuccinate    Past Medical History:  HYPOTHYROIDISM (ICD-244.9)  BRONCHIECTASIS W/ACUTE EXACERBATION (ICD-494.1)  - Xolair attempted 6/04 -> 9/05 no benefit - Immunotherapy stopped 04/2008  - Started xyflo 8/09 > 05/2008 no benefit  - VEST 2008 as inpt > no benefit  - Flutter valve helpful March 17, 2009  - Allergy profile April 12, 2010 >> IgE 30 unremarkable  - Dulera 200 added November 22, 2010  Could not tol thrush HYPERLIPIDEMIA (ICD-272.4)  - Target LDL < 70 due to prev TIA's  TRANSIENT ISCHEMIC ATTACKS, HX OF (ICD-V12.50).............................................Marland KitchenReynolds  OSTEOPOROSIS (ICD-733.00)  - Reclast January 2009, 2010, October 26, 2009 , 11/2010 , 11/3011 , due >01/10/12  - Bone Density 08/05/09 Spine 0.1 Left -2.2, Right -1.6  Health  Maintenance...................................................................................................Marland KitchenWert  -Td 11/2003 -Pneumovax 11/04 and 05/2008 -Med calendar 12/24/08  And 03/01/11, 10/06/2011  -CPX 06/12/2013  -GYN health maint...........................................................................Marland Kitchen  Mody Asthma  Diverticulosis  - Colonoscopy 01/01/09.....................................................................Marland KitchenFuller Plan  GERD  Irritable Bowel Syndrome  Hemorrhoids  R Shoulder Pain..................................................................................Marland KitchenIva  Hypothyroid     Social History She never married. No kids. Took care of her parents all her life  She has a gentlemen friend that she has been with since she was 85 but he lives in [place]  (He is now in his 22s but she never wanted to marry him).  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 ,max sinus tenderness  NECK : without JVD/Nodes/TM/ nl carotid upstrokes bilaterally  LUNGS: no acc muscle use, pan exp sonorous junky rhonchi bilaterally  CV: RRR no s3 or murmur or increase in P2, no edema  ABD: soft and nontender with nl excursion in the supine position. No bruits or organomegaly, bowel sounds nl   MS: nl gait/  warm without deformities, calf tenderness, cyanosis or clubbing Neuro alert/approp/ no motor or cerebellar def      CXR  08/07/2013 :  COPD. Stable bibasilar nodular  densities which may be saccular bronchiectasis with fluid-filled dilated airways. . CT chest suggested for further evaluation.  CT chest 08/09/13 extensive cylindrical bronchiectasis w/ extensive mucoid impaction     Assessment:

## 2014-03-11 NOTE — Patient Instructions (Addendum)
When you start needing albuterol neb as a rescue medication, increase prednisone to 40 mg daily and work back down once you are better  Keep the follow up appt with Tammy

## 2014-03-13 NOTE — Assessment & Plan Note (Signed)
Patient Saturations on Room Air at Rest = 85%  12-19-11     - 02/17/2012  Walked 3lpm pulsed  x 1 laps @ 185 ft each stopped due to sob and sat 89% - 10/28/2013 on 4lpm pusled desat's walking 10 ft > changed on 4lpm continuous s desats       -rx =  3lpm at hs and with activity within room,  4lpm continuous with activity outside the room (no pulsed)

## 2014-03-13 NOTE — Assessment & Plan Note (Signed)
-   Xolair attempted 6/04 -> 05/2004  - Immunotherapy stopped 8/09  - Started xyflo 8/09 > 9/09 no benefit  - VEST 2008 no benefit  - Flutter valve helpful March 17, 2009  - Allergy profile April 12, 2010 >> IgE 30 unremarkable  - Dulera 200 added November 22, 2010 > could not tolerate -Sputum cx 08/29/2011 +ESCHERICHIA COLI mixed sensitivities > flare tx w/ augmentin (d/t sulfa all)   -perfomist 02/17/2012 > no benefit > rechallenged  12/27/2012  - Prevnar given 02/10/2014   Mild acute exac >   Each maintenance medication was reviewed in detail including most importantly the difference between maintenance and as needed and under what circumstances the prns are to be used. This was done in the context of a medication calendar review which provided the patient with a user-friendly unambiguous mechanism for medication administration and reconciliation and provides an action plan for all active problems. It is critical that this be shown to every doctor  for modification during the office visit if necessary so the patient can use it as a working document.

## 2014-03-19 ENCOUNTER — Other Ambulatory Visit: Payer: Self-pay | Admitting: Internal Medicine

## 2014-03-24 ENCOUNTER — Encounter: Payer: Medicare Other | Admitting: Adult Health

## 2014-04-07 ENCOUNTER — Ambulatory Visit (INDEPENDENT_AMBULATORY_CARE_PROVIDER_SITE_OTHER): Payer: Medicare Other | Admitting: Adult Health

## 2014-04-07 ENCOUNTER — Encounter: Payer: Self-pay | Admitting: Adult Health

## 2014-04-07 VITALS — HR 101 | Temp 98.2°F

## 2014-04-07 DIAGNOSIS — J471 Bronchiectasis with (acute) exacerbation: Secondary | ICD-10-CM

## 2014-04-07 MED ORDER — AMOXICILLIN-POT CLAVULANATE 875-125 MG PO TABS: 1.0000 | ORAL_TABLET | Freq: Two times a day (BID) | ORAL | Status: AC

## 2014-04-07 NOTE — Assessment & Plan Note (Signed)
Exacerbation   Plan  Wear Oxygen 4l/m continuous flow 24/7 . Avoid pulsing O2 devices.  Mucinex  Twice daily  As needed  Cough/congestion  Along w/ flutter valve  Augmentin 875mg  Twice daily  For 10 days , take with food.  Increase Prednisone 40mg  daily for 5 days, 30mg  daily for 5 days then 20mg  daily for 5 days then hold at 10mg  daily  Follow up in 3-4 weeks for Med Calendar with Tammy  Please contact office for sooner follow up if symptoms do not improve or worsen or seek emergency care

## 2014-04-07 NOTE — Progress Notes (Signed)
Subjective:     Patient ID: [name], female   DOB: Apr 14, 1933 .   MRN: [id number]   Brief patient profile:  28 yowf never smoker with documented chronic sinusitis /bronchiectasis and a tendency to recurrent purulent exacerbations, has been self titrating prednisone with a ceiling of 40 mg per day and a floor of 5 extensively evaluated at [facility] and by Dr Allena Katz with neg w/u x atopic.    History of Present Illness  12/19/2011 Follow up  Patient returns today for a followup visit. Patient has history of osteoporosis and has been on yearly Reclast infusions. She is due for her Reclast this month. Takes oscal d Twice daily   Over the last week. Complains of increased cough, congestion, with thick, green mucus. Has not filled her standing rx for omnicef. Currently on prednisone 10mg  daily  No hemoptysis , chest pain or edema.  >>Reclast rx and Omnicef rx x 10 d       04/26/2013 Acute OV  Complains of increased urinary frequency, with cloudiness x2 weeks.  Dyspnea is unchanged; denies dysurina, pressure, odor. No back pain, n/v, hematuria or fever.  No recent UTI . Over last 2 weeks more symptoms of urinary urgency and frequenty. Last few days, urine has strong odor and is dark and cloudy. No dysuria or discharge. Also BMD returned with worsening T score -3.2. Discussed continuing yearly Reclast. Advised to cont on calcium and vitamin d.  rec Cipro 250mg  Twice daily  For 7 days  Push fluids  Healthy urinary practices.  Return in couple of weeks for labs for Reclast> did not return   06/12/2013 f/u ov/[name] re: bronchiectasis/ hypothyroid/hbp/ yearly comprehensive  Chief Complaint  Patient presents with  . Annual Exam    Pt fasting. Pt c/o increased SOB over the past several months. Her cough is also worse and is prod with green sputum for the past 2 months. Taking pred 10 mg daily.   doxy rx > no better.  Can't tol vest.   >>labs w/ good cholesterol at goal , TSH elevated w/  increased dose of synthroid rx   07/09/2013 Follow up  4 week follow up Bronchiectasis and discuss Reclast  Had recent flare of bronchiectasis that resolved with abx and return to her baseline.  Reports breathing is primarily unchanged since last ov  . Cough not quite as bad. No fever or hemoptysis.  Is due for Reclast. BMD w/ T score -3.2.  Lost her long term boyfriend last week, he passed away with dementia. They never married. Says she is dealing ok but is very sad, support given  She remains in [place] at [facility].  rec No change rx  08/07/2013 f/u ov/[name] re: bronchiectasis / hypothyroid/hypertension/ osteo Chief Complaint  Patient presents with  . Acute Visit    Increased SOB,  cough w/ green mucus, wheezing and tightness x2 months    Just fiinshed a burst of pred, no recent abx,not really using her prns aggressively. Sob with more than room to room walking/ 02 dep  >CT chest showed 08/09/13 extensive cylindrical bronchiectasis w/ extensive mucoid impaction  rec No change rx/ ice pack to R shoulder prn  09/16/2013 Follow up  Pt returns ov for 6 week follow up for bronchiectasis.  Patient with recent bronchiectasis, flare, and treated with a course of prednisone, with her aggressive pulmonary hygiene with nebulizers. Patient reports she is slowly improving, however, to have a daily. Chronic cough. Patient had a CT  chest last visit showing extensive bronchiectasis with extensive mucoid impactions, which has been similar to her CTs in the past. Patient was also having some right shoulder blade pain. It has resolved.  rec  no change rx   10/28/2013 f/u ov/[name] re:  Chief Complaint  Patient presents with  . Follow-up    Pt states that her cough and dyspnea are progressively worse since the last visit. Cough is prod with minimal green sputum.   walks from her room to cafeteria on 02 4lpm pulsed, 2lpm at home   >>no changes   01/27/2014 Follow up  Returns for  3  month follow up Reports dyspnea with walking is getting worse. Wears out easily . Was suppose to use Oxygen 4ll/m continuous flow from last ov however on pulsing device on arrival with O2 sats 79-84%.  We discussed using her walker with basket to carry O2 tank.  Had increase cough and wheezing last week -did pred taper, now back to 10mg  daily .  No fever, increased discolored mucus, abd pain, n/v/d, or edema, or chest pain.  rec Wear Oxygen 3 l/m at rest and 4l/m continuous flow walking  Mucinex  Twice daily  As needed  Cough/congestion  Along w/ flutter valve    02/10/2014 f/u ov/[name] re: ab/bronchiectasis/ has med calendar but not using action plan Chief Complaint  Patient presents with  . Follow-up    Pt states that her breathing is progressively worse since the last visit. She is currently on 40 mg prednisone. She c/o prod cough with large amounts of yellow sputum x 1 wk.    not using augmentin, nasal steroids, afrin as per calendar and having more purulent sputum and nasal congestion. Comfortable at rest p saba but only using once or twice a day rec Follow the med calendar  03/11/2014 f/u ov/[name] re: prednisone at 10 baseline, when feeling fine no need for albuterol while on perforimist bid Chief Complaint  Patient presents with  . Acute Visit    Pt c/o increased DOE for the past 5 days. She states that she gets out of breath just walking across the room.  She also c/o increased cough with large amounts of yellow sputum and wheezing. She has been using albuterol neb at least twice per day and as of today increased pred to 40 mg.   worse x 5 days, just increased the prednisone one day prior to OV, has not yet started omnaris or augmentin from her action plans.  No sob at rest  >>pred burst .    04/07/2014 Acute OV  Complains of increased SOB, wheezing, prod cough with green mucus x3 days.  Denies hemoptysis, n/v/d, PND, leg swelling, orthopnea or fever.  Currently on prednisone 10mg   daily  Returns back to office on pulsing O2 , desats on demad device. We discussed importance of continuous flow o2 and she agrees to change devices .     Current Medications, Allergies, Complete Past Medical History, Past Surgical History, Family History, and Social History were reviewed in [employer] record.  ROS  The following are not active complaints unless bolded sore throat, dysphagia, dental problems, itching, sneezing,  nasal congestion or excess/ purulent secretions, ear ache,   fever, chills, sweats, unintended wt loss, pleuritic or exertional cp, hemoptysis,  orthopnea pnd or leg swelling, presyncope, palpitations, heartburn, abdominal pain, anorexia, nausea, vomiting, diarrhea  or change in bowel or urinary habits, change in stools or urine, dysuria,hematuria,  rash, arthralgias, visual complaints,  headache, numbness weakness or ataxia or problems with walking or coordination,  change in mood/affect or memory.          .         Allergies  1) ! Iodine  2) Sulfamethoxazole (Sulfamethoxazole)  3) Erythromycin Ethylsuccinate    Past Medical History:  HYPOTHYROIDISM (ICD-244.9)  BRONCHIECTASIS W/ACUTE EXACERBATION (ICD-494.1)  - Xolair attempted 6/04 -> 9/05 no benefit - Immunotherapy stopped 04/2008  - Started xyflo 8/09 > 05/2008 no benefit  - VEST 2008 as inpt > no benefit  - Flutter valve helpful March 17, 2009  - Allergy profile April 12, 2010 >> IgE 30 unremarkable  - Dulera 200 added November 22, 2010  Could not tol thrush HYPERLIPIDEMIA (ICD-272.4)  - Target LDL < 70 due to prev TIA's  TRANSIENT ISCHEMIC ATTACKS, HX OF (ICD-V12.50).............................................Marland KitchenReynolds  OSTEOPOROSIS (ICD-733.00)  - Reclast January 2009, 2010, October 26, 2009 , 11/2010 , 11/3011 , due >01/10/12  - Bone Density 08/05/09 Spine 0.1 Left -2.2, Right -1.6  Health  Maintenance...................................................................................................Marland KitchenWert  -Td 11/2003 -Pneumovax 11/04 and 05/2008 -Med calendar 12/24/08  And 03/01/11, 10/06/2011  -CPX 06/12/2013  -GYN health maint...........................................................................Marland Kitchen  Mody Asthma  Diverticulosis  - Colonoscopy 01/01/09.....................................................................Marland KitchenFuller Plan  GERD  Irritable Bowel Syndrome  Hemorrhoids  R Shoulder Pain..................................................................................Marland KitchenKittanning  Hypothyroid     Social History She never married. No kids. Took care of her parents all her life  She has a gentlemen friend that she has been with since she was 36 but he lives in [place]  (He is now in his 29s but she never wanted to marry him).  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 ,max sinus tenderness  NECK : without JVD/Nodes/TM/ nl carotid upstrokes bilaterally  LUNGS: no acc muscle use, pan exp sonorous junky rhonchi bilaterally  CV: RRR no s3 or murmur or increase in P2, no edema  ABD: soft and nontender with nl excursion in the supine position. No bruits or organomegaly, bowel sounds nl   MS: nl gait/  warm without deformities, calf tenderness, cyanosis or clubbing Neuro alert/approp/ no motor or cerebellar def      CXR  08/07/2013 :  COPD. Stable bibasilar  nodular densities which may be saccular bronchiectasis with fluid-filled dilated airways. . CT chest suggested for further evaluation.  CT chest 08/09/13 extensive cylindrical bronchiectasis w/ extensive mucoid impaction     Assessment:

## 2014-04-07 NOTE — Patient Instructions (Signed)
Wear Oxygen 4l/m continuous flow 24/7 . Avoid pulsing O2 devices.  Mucinex  Twice daily  As needed  Cough/congestion  Along w/ flutter valve  Augmentin 875mg  Twice daily  For 10 days , take with food.  Increase Prednisone 40mg  daily for 5 days, 30mg  daily for 5 days then 20mg  daily for 5 days then hold at 10mg  daily  Follow up in 3-4 weeks for Med Calendar with Lamar Meter  Please contact office for sooner follow up if symptoms do not improve or worsen or seek emergency care

## 2014-04-16 ENCOUNTER — Other Ambulatory Visit: Payer: Self-pay | Admitting: Internal Medicine

## 2014-04-29 ENCOUNTER — Encounter: Payer: Medicare Other | Admitting: Adult Health

## 2014-04-30 ENCOUNTER — Other Ambulatory Visit: Payer: Self-pay | Admitting: Internal Medicine

## 2014-04-30 NOTE — Telephone Encounter (Signed)
rx printed, signed and faxed to [facility]

## 2014-05-07 ENCOUNTER — Telehealth: Payer: Self-pay | Admitting: Internal Medicine

## 2014-05-07 MED ORDER — BECLOMETHASONE DIPROPIONATE 80 MCG/ACT IN AERS: INHALATION_SPRAY | RESPIRATORY_TRACT | Status: DC

## 2014-05-07 NOTE — Telephone Encounter (Signed)
QVAR has been sent to [place] pt lmtcb x1

## 2014-05-08 NOTE — Telephone Encounter (Signed)
Patient returning call.  Advised patient qvar has been sent to pharmacy.  Nothing else needed.  [facility]

## 2014-05-11 ENCOUNTER — Inpatient Hospital Stay (HOSPITAL_COMMUNITY)
Admission: EM | Admit: 2014-05-11 | Discharge: 2014-05-16 | DRG: 177 | Disposition: A | Discharge status: Skilled Nursing Facility | Payer: Medicare Other | Attending: Pulmonary Disease | Admitting: Pulmonary Disease

## 2014-05-11 ENCOUNTER — Emergency Department (HOSPITAL_COMMUNITY)
Admission: EM | Admit: 2014-05-11 | Discharge: 2014-05-11 | Disposition: A | Discharge status: Skilled Nursing Facility | Payer: Medicare Other | Source: Home / Self Care | Attending: Emergency Medicine | Admitting: Emergency Medicine

## 2014-05-11 ENCOUNTER — Emergency Department (HOSPITAL_COMMUNITY): Payer: Medicare Other

## 2014-05-11 ENCOUNTER — Encounter (HOSPITAL_COMMUNITY): Payer: Self-pay | Admitting: Emergency Medicine

## 2014-05-11 DIAGNOSIS — J9610 Chronic respiratory failure, unspecified whether with hypoxia or hypercapnia: Secondary | ICD-10-CM

## 2014-05-11 DIAGNOSIS — Z888 Allergy status to other drugs, medicaments and biological substances status: Secondary | ICD-10-CM

## 2014-05-11 DIAGNOSIS — R7309 Other abnormal glucose: Secondary | ICD-10-CM | POA: Diagnosis present

## 2014-05-11 DIAGNOSIS — J690 Pneumonitis due to inhalation of food and vomit: Secondary | ICD-10-CM | POA: Diagnosis present

## 2014-05-11 DIAGNOSIS — F329 Major depressive disorder, single episode, unspecified: Secondary | ICD-10-CM | POA: Diagnosis present

## 2014-05-11 DIAGNOSIS — Z9981 Dependence on supplemental oxygen: Secondary | ICD-10-CM

## 2014-05-11 DIAGNOSIS — S32509A Unspecified fracture of unspecified pubis, initial encounter for closed fracture: Secondary | ICD-10-CM | POA: Diagnosis present

## 2014-05-11 DIAGNOSIS — M810 Age-related osteoporosis without current pathological fracture: Secondary | ICD-10-CM | POA: Diagnosis present

## 2014-05-11 DIAGNOSIS — K5730 Diverticulosis of large intestine without perforation or abscess without bleeding: Secondary | ICD-10-CM | POA: Diagnosis present

## 2014-05-11 DIAGNOSIS — E872 Acidosis, unspecified: Secondary | ICD-10-CM | POA: Diagnosis present

## 2014-05-11 DIAGNOSIS — Z8673 Personal history of transient ischemic attack (TIA), and cerebral infarction without residual deficits: Secondary | ICD-10-CM | POA: Diagnosis not present

## 2014-05-11 DIAGNOSIS — J329 Chronic sinusitis, unspecified: Secondary | ICD-10-CM | POA: Diagnosis present

## 2014-05-11 DIAGNOSIS — Z881 Allergy status to other antibiotic agents status: Secondary | ICD-10-CM | POA: Diagnosis not present

## 2014-05-11 DIAGNOSIS — E039 Hypothyroidism, unspecified: Secondary | ICD-10-CM | POA: Diagnosis present

## 2014-05-11 DIAGNOSIS — G9341 Metabolic encephalopathy: Secondary | ICD-10-CM | POA: Diagnosis present

## 2014-05-11 DIAGNOSIS — J310 Chronic rhinitis: Secondary | ICD-10-CM

## 2014-05-11 DIAGNOSIS — E785 Hyperlipidemia, unspecified: Secondary | ICD-10-CM | POA: Diagnosis present

## 2014-05-11 DIAGNOSIS — J471 Bronchiectasis with (acute) exacerbation: Secondary | ICD-10-CM | POA: Diagnosis present

## 2014-05-11 DIAGNOSIS — Z807 Family history of other malignant neoplasms of lymphoid, hematopoietic and related tissues: Secondary | ICD-10-CM | POA: Diagnosis not present

## 2014-05-11 DIAGNOSIS — Y9210 Unspecified residential institution as the place of occurrence of the external cause: Secondary | ICD-10-CM | POA: Diagnosis present

## 2014-05-11 DIAGNOSIS — J9819 Other pulmonary collapse: Secondary | ICD-10-CM | POA: Diagnosis present

## 2014-05-11 DIAGNOSIS — Z823 Family history of stroke: Secondary | ICD-10-CM

## 2014-05-11 DIAGNOSIS — W19XXXA Unspecified fall, initial encounter: Secondary | ICD-10-CM | POA: Diagnosis present

## 2014-05-11 DIAGNOSIS — J9602 Acute respiratory failure with hypercapnia: Secondary | ICD-10-CM

## 2014-05-11 DIAGNOSIS — Z8249 Family history of ischemic heart disease and other diseases of the circulatory system: Secondary | ICD-10-CM | POA: Diagnosis not present

## 2014-05-11 DIAGNOSIS — Z9089 Acquired absence of other organs: Secondary | ICD-10-CM | POA: Diagnosis not present

## 2014-05-11 DIAGNOSIS — Z91018 Allergy to other foods: Secondary | ICD-10-CM | POA: Diagnosis not present

## 2014-05-11 DIAGNOSIS — N179 Acute kidney failure, unspecified: Secondary | ICD-10-CM | POA: Diagnosis present

## 2014-05-11 DIAGNOSIS — Z79899 Other long term (current) drug therapy: Secondary | ICD-10-CM | POA: Diagnosis not present

## 2014-05-11 DIAGNOSIS — K589 Irritable bowel syndrome without diarrhea: Secondary | ICD-10-CM | POA: Diagnosis present

## 2014-05-11 DIAGNOSIS — Z800 Family history of malignant neoplasm of digestive organs: Secondary | ICD-10-CM

## 2014-05-11 DIAGNOSIS — Z91041 Radiographic dye allergy status: Secondary | ICD-10-CM

## 2014-05-11 DIAGNOSIS — K219 Gastro-esophageal reflux disease without esophagitis: Secondary | ICD-10-CM | POA: Diagnosis present

## 2014-05-11 DIAGNOSIS — I10 Essential (primary) hypertension: Secondary | ICD-10-CM | POA: Diagnosis present

## 2014-05-11 DIAGNOSIS — F3289 Other specified depressive episodes: Secondary | ICD-10-CM | POA: Diagnosis present

## 2014-05-11 DIAGNOSIS — W010XXS Fall on same level from slipping, tripping and stumbling without subsequent striking against object, sequela: Secondary | ICD-10-CM

## 2014-05-11 DIAGNOSIS — J9620 Acute and chronic respiratory failure, unspecified whether with hypoxia or hypercapnia: Secondary | ICD-10-CM | POA: Diagnosis present

## 2014-05-11 DIAGNOSIS — E876 Hypokalemia: Secondary | ICD-10-CM | POA: Diagnosis present

## 2014-05-11 DIAGNOSIS — J189 Pneumonia, unspecified organism: Secondary | ICD-10-CM

## 2014-05-11 DIAGNOSIS — S32591A Other specified fracture of right pubis, initial encounter for closed fracture: Secondary | ICD-10-CM

## 2014-05-11 DIAGNOSIS — Z7902 Long term (current) use of antithrombotics/antiplatelets: Secondary | ICD-10-CM

## 2014-05-11 DIAGNOSIS — R079 Chest pain, unspecified: Secondary | ICD-10-CM

## 2014-05-11 DIAGNOSIS — R0602 Shortness of breath: Secondary | ICD-10-CM

## 2014-05-11 DIAGNOSIS — J9600 Acute respiratory failure, unspecified whether with hypoxia or hypercapnia: Secondary | ICD-10-CM | POA: Diagnosis present

## 2014-05-11 DIAGNOSIS — R0609 Other forms of dyspnea: Secondary | ICD-10-CM | POA: Diagnosis not present

## 2014-05-11 DIAGNOSIS — J9601 Acute respiratory failure with hypoxia: Secondary | ICD-10-CM

## 2014-05-11 DIAGNOSIS — R0989 Other specified symptoms and signs involving the circulatory and respiratory systems: Secondary | ICD-10-CM | POA: Diagnosis present

## 2014-05-11 DIAGNOSIS — IMO0002 Reserved for concepts with insufficient information to code with codable children: Secondary | ICD-10-CM | POA: Diagnosis not present

## 2014-05-11 LAB — CBC WITH DIFFERENTIAL/PLATELET
Basophils Absolute: 0.1 10*3/uL (ref 0.0–0.1)
Basophils Relative: 0 % (ref 0–1)
EOS ABS: 0.1 10*3/uL (ref 0.0–0.7)
Eosinophils Relative: 0 % (ref 0–5)
HCT: 42.3 % (ref 36.0–46.0)
HEMOGLOBIN: 12.7 g/dL (ref 12.0–15.0)
LYMPHS ABS: 1.9 10*3/uL (ref 0.7–4.0)
LYMPHS PCT: 6 % — AB (ref 12–46)
MCH: 25.1 pg — AB (ref 26.0–34.0)
MCHC: 30 g/dL (ref 30.0–36.0)
MCV: 83.6 fL (ref 78.0–100.0)
MONOS PCT: 12 % (ref 3–12)
Monocytes Absolute: 3.6 10*3/uL — ABNORMAL HIGH (ref 0.1–1.0)
NEUTROS PCT: 81 % — AB (ref 43–77)
Neutro Abs: 23.9 10*3/uL — ABNORMAL HIGH (ref 1.7–7.7)
Platelets: 372 10*3/uL (ref 150–400)
RBC: 5.06 MIL/uL (ref 3.87–5.11)
RDW: 15.9 % — ABNORMAL HIGH (ref 11.5–15.5)
WBC: 29.5 10*3/uL — ABNORMAL HIGH (ref 4.0–10.5)

## 2014-05-11 LAB — I-STAT CHEM 8, ED
BUN: 11 mg/dL (ref 6–23)
CREATININE: 0.9 mg/dL (ref 0.50–1.10)
Calcium, Ion: 1.17 mmol/L (ref 1.13–1.30)
Chloride: 93 mEq/L — ABNORMAL LOW (ref 96–112)
GLUCOSE: 176 mg/dL — AB (ref 70–99)
HCT: 43 % (ref 36.0–46.0)
Hemoglobin: 14.6 g/dL (ref 12.0–15.0)
POTASSIUM: 3 meq/L — AB (ref 3.7–5.3)
Sodium: 135 mEq/L — ABNORMAL LOW (ref 137–147)
TCO2: 31 mmol/L (ref 0–100)

## 2014-05-11 LAB — URINALYSIS, ROUTINE W REFLEX MICROSCOPIC
BILIRUBIN URINE: NEGATIVE
Bilirubin Urine: NEGATIVE
GLUCOSE, UA: NEGATIVE mg/dL
Glucose, UA: NEGATIVE mg/dL
Hgb urine dipstick: NEGATIVE
Ketones, ur: NEGATIVE mg/dL
Ketones, ur: NEGATIVE mg/dL
Leukocytes, UA: NEGATIVE
Nitrite: NEGATIVE
Nitrite: NEGATIVE
PH: 7.5 (ref 5.0–8.0)
Protein, ur: NEGATIVE mg/dL
Protein, ur: 30 mg/dL — AB
SPECIFIC GRAVITY, URINE: 1.013 (ref 1.005–1.030)
Specific Gravity, Urine: 1.011 (ref 1.005–1.030)
UROBILINOGEN UA: 0.2 mg/dL (ref 0.0–1.0)
Urobilinogen, UA: 0.2 mg/dL (ref 0.0–1.0)
pH: 6.5 (ref 5.0–8.0)

## 2014-05-11 LAB — COMPREHENSIVE METABOLIC PANEL
ALT: 35 U/L (ref 0–35)
ANION GAP: 15 (ref 5–15)
AST: 40 U/L — ABNORMAL HIGH (ref 0–37)
Albumin: 3.5 g/dL (ref 3.5–5.2)
Alkaline Phosphatase: 74 U/L (ref 39–117)
BILIRUBIN TOTAL: 0.2 mg/dL — AB (ref 0.3–1.2)
BUN: 12 mg/dL (ref 6–23)
CO2: 29 meq/L (ref 19–32)
Calcium: 9.2 mg/dL (ref 8.4–10.5)
Chloride: 92 mEq/L — ABNORMAL LOW (ref 96–112)
Creatinine, Ser: 0.92 mg/dL (ref 0.50–1.10)
GFR calc Af Amer: 67 mL/min — ABNORMAL LOW (ref >90)
GFR calc non Af Amer: 58 mL/min — ABNORMAL LOW (ref >90)
Glucose, Bld: 182 mg/dL — ABNORMAL HIGH (ref 70–99)
Potassium: 3.1 mEq/L — ABNORMAL LOW (ref 3.7–5.3)
Sodium: 136 mEq/L — ABNORMAL LOW (ref 137–147)
Total Protein: 7.8 g/dL (ref 6.0–8.3)

## 2014-05-11 LAB — I-STAT ARTERIAL BLOOD GAS, ED
ACID-BASE EXCESS: 4 mmol/L — AB (ref 0.0–2.0)
Acid-Base Excess: 5 mmol/L — ABNORMAL HIGH (ref 0.0–2.0)
Bicarbonate: 32.8 mEq/L — ABNORMAL HIGH (ref 20.0–24.0)
Bicarbonate: 32 mEq/L — ABNORMAL HIGH (ref 20.0–24.0)
O2 SAT: 91 %
O2 SAT: 93 %
PH ART: 7.364 (ref 7.350–7.450)
PO2 ART: 71 mmHg — AB (ref 80.0–100.0)
Patient temperature: 98.6
Patient temperature: 98.6
TCO2: 35 mmol/L (ref 0–100)
TCO2: 34 mmol/L (ref 0–100)
pCO2 arterial: 65.9 mmHg (ref 35.0–45.0)
pCO2 arterial: 56.1 mmHg — ABNORMAL HIGH (ref 35.0–45.0)
pH, Arterial: 7.305 — ABNORMAL LOW (ref 7.350–7.450)
pO2, Arterial: 71 mmHg — ABNORMAL LOW (ref 80.0–100.0)

## 2014-05-11 LAB — CBC
HEMATOCRIT: 41.8 % (ref 36.0–46.0)
HEMOGLOBIN: 12.7 g/dL (ref 12.0–15.0)
MCH: 25.1 pg — AB (ref 26.0–34.0)
MCHC: 30.4 g/dL (ref 30.0–36.0)
MCV: 82.8 fL (ref 78.0–100.0)
Platelets: 351 10*3/uL (ref 150–400)
RBC: 5.05 MIL/uL (ref 3.87–5.11)
RDW: 15.8 % — ABNORMAL HIGH (ref 11.5–15.5)
WBC: 33.8 10*3/uL — ABNORMAL HIGH (ref 4.0–10.5)

## 2014-05-11 LAB — PROTIME-INR
INR: 0.96 (ref 0.00–1.49)
Prothrombin Time: 12.8 seconds (ref 11.6–15.2)

## 2014-05-11 LAB — GLUCOSE, CAPILLARY: GLUCOSE-CAPILLARY: 191 mg/dL — AB (ref 70–99)

## 2014-05-11 LAB — URINE MICROSCOPIC-ADD ON

## 2014-05-11 LAB — PRO B NATRIURETIC PEPTIDE
Pro B Natriuretic peptide (BNP): 442.9 pg/mL (ref 0–450)
Pro B Natriuretic peptide (BNP): 1377 pg/mL — ABNORMAL HIGH (ref 0–450)

## 2014-05-11 LAB — TROPONIN I
Troponin I: 0.67 ng/mL (ref <0.30)
Troponin I: 1.28 ng/mL (ref <0.30)

## 2014-05-11 LAB — CREATININE, SERUM
Creatinine, Ser: 1.07 mg/dL (ref 0.50–1.10)
GFR calc Af Amer: 56 mL/min — ABNORMAL LOW (ref >90)
GFR calc non Af Amer: 48 mL/min — ABNORMAL LOW (ref >90)

## 2014-05-11 LAB — I-STAT TROPONIN, ED: Troponin i, poc: 0.09 ng/mL (ref 0.00–0.08)

## 2014-05-11 LAB — D-DIMER, QUANTITATIVE (NOT AT ARMC): D DIMER QUANT: 11.94 ug{FEU}/mL — AB (ref 0.00–0.48)

## 2014-05-11 LAB — LACTIC ACID, PLASMA: Lactic Acid, Venous: 2.5 mmol/L — ABNORMAL HIGH (ref 0.5–2.2)

## 2014-05-11 LAB — I-STAT CG4 LACTIC ACID, ED: Lactic Acid, Venous: 1.5 mmol/L (ref 0.5–2.2)

## 2014-05-11 LAB — PROCALCITONIN: Procalcitonin: 0.51 ng/mL

## 2014-05-11 MED ORDER — DEXTROSE 5 % IV SOLN
1.0000 g | Freq: Two times a day (BID) | INTRAVENOUS | Status: DC
  Administered 2014-05-11 – 2014-05-12 (×2): 1 g via INTRAVENOUS
  Filled 2014-05-11 (×4): qty 1

## 2014-05-11 MED ORDER — ALBUTEROL SULFATE (2.5 MG/3ML) 0.083% IN NEBU
2.5000 mg | INHALATION_SOLUTION | Freq: Four times a day (QID) | RESPIRATORY_TRACT | Status: DC
  Administered 2014-05-11: 2.5 mg via RESPIRATORY_TRACT

## 2014-05-11 MED ORDER — ENOXAPARIN SODIUM 60 MG/0.6ML ~~LOC~~ SOLN
50.0000 mg | Freq: Two times a day (BID) | SUBCUTANEOUS | Status: DC
  Filled 2014-05-11 (×2): qty 0.6

## 2014-05-11 MED ORDER — ENOXAPARIN SODIUM 40 MG/0.4ML ~~LOC~~ SOLN
40.0000 mg | SUBCUTANEOUS | Status: DC
  Administered 2014-05-11: 40 mg via SUBCUTANEOUS
  Filled 2014-05-11: qty 0.4

## 2014-05-11 MED ORDER — SODIUM CHLORIDE 0.9 % IJ SOLN: 3.0000 mL | INTRAMUSCULAR | Status: DC | PRN

## 2014-05-11 MED ORDER — VANCOMYCIN HCL IN DEXTROSE 750-5 MG/150ML-% IV SOLN
750.0000 mg | INTRAVENOUS | Status: DC
  Administered 2014-05-11: 750 mg via INTRAVENOUS
  Filled 2014-05-11 (×2): qty 150

## 2014-05-11 MED ORDER — IPRATROPIUM-ALBUTEROL 0.5-2.5 (3) MG/3ML IN SOLN
3.0000 mL | Freq: Once | RESPIRATORY_TRACT | Status: AC
  Administered 2014-05-11: 3 mL via RESPIRATORY_TRACT
  Filled 2014-05-11: qty 3

## 2014-05-11 MED ORDER — FENTANYL CITRATE 0.05 MG/ML IJ SOLN: 50.0000 ug | Freq: Once | INTRAMUSCULAR | Status: DC

## 2014-05-11 MED ORDER — LEVOFLOXACIN IN D5W 500 MG/100ML IV SOLN
500.0000 mg | Freq: Once | INTRAVENOUS | Status: AC
  Administered 2014-05-11: 500 mg via INTRAVENOUS
  Filled 2014-05-11 (×2): qty 100

## 2014-05-11 MED ORDER — OXYCODONE-ACETAMINOPHEN 5-325 MG PO TABS
1.0000 | ORAL_TABLET | Freq: Four times a day (QID) | ORAL | Status: DC | PRN
Start: 2014-05-11 — End: 2014-05-16

## 2014-05-11 MED ORDER — METHYLPREDNISOLONE SODIUM SUCC 125 MG IJ SOLR
125.0000 mg | Freq: Once | INTRAMUSCULAR | Status: AC
  Administered 2014-05-11: 125 mg via INTRAVENOUS
  Filled 2014-05-11: qty 2

## 2014-05-11 MED ORDER — BUDESONIDE 0.25 MG/2ML IN SUSP
0.2500 mg | Freq: Four times a day (QID) | RESPIRATORY_TRACT | Status: DC
  Administered 2014-05-12: 0.25 mg via RESPIRATORY_TRACT
  Filled 2014-05-11 (×3): qty 2

## 2014-05-11 MED ORDER — POTASSIUM CHLORIDE 10 MEQ/100ML IV SOLN
10.0000 meq | INTRAVENOUS | Status: AC
  Administered 2014-05-11 (×2): 10 meq via INTRAVENOUS
  Filled 2014-05-11 (×2): qty 100

## 2014-05-11 MED ORDER — SODIUM CHLORIDE 0.9 % IV SOLN: 250.0000 mL | INTRAVENOUS | Status: DC | PRN

## 2014-05-11 MED ORDER — LEVOTHYROXINE SODIUM 100 MCG IV SOLR
50.0000 ug | Freq: Every day | INTRAVENOUS | Status: DC
  Administered 2014-05-12 – 2014-05-14 (×3): 50 ug via INTRAVENOUS
  Filled 2014-05-11 (×5): qty 5

## 2014-05-11 MED ORDER — DIPHENHYDRAMINE HCL 50 MG/ML IJ SOLN
50.0000 mg | Freq: Once | INTRAMUSCULAR | Status: AC
  Administered 2014-05-12: 50 mg via INTRAVENOUS
  Filled 2014-05-11: qty 1

## 2014-05-11 MED ORDER — PANTOPRAZOLE SODIUM 40 MG IV SOLR
40.0000 mg | Freq: Every day | INTRAVENOUS | Status: DC
  Administered 2014-05-11 – 2014-05-12 (×2): 40 mg via INTRAVENOUS
  Filled 2014-05-11 (×3): qty 40

## 2014-05-11 MED ORDER — IPRATROPIUM-ALBUTEROL 0.5-2.5 (3) MG/3ML IN SOLN
3.0000 mL | Freq: Four times a day (QID) | RESPIRATORY_TRACT | Status: DC
  Administered 2014-05-11 – 2014-05-14 (×10): 3 mL via RESPIRATORY_TRACT
  Filled 2014-05-11 (×11): qty 3

## 2014-05-11 MED ORDER — HYDROCORTISONE NA SUCCINATE PF 250 MG IJ SOLR
200.0000 mg | Freq: Four times a day (QID) | INTRAMUSCULAR | Status: AC
  Administered 2014-05-11 – 2014-05-12 (×2): 200 mg via INTRAVENOUS
  Filled 2014-05-11 (×4): qty 200

## 2014-05-11 MED ORDER — ONDANSETRON HCL 4 MG/2ML IJ SOLN
4.0000 mg | Freq: Once | INTRAMUSCULAR | Status: AC
  Administered 2014-05-11: 4 mg via INTRAVENOUS
  Filled 2014-05-11: qty 2

## 2014-05-11 MED ORDER — OXYCODONE-ACETAMINOPHEN 5-325 MG PO TABS
1.0000 | ORAL_TABLET | Freq: Once | ORAL | Status: AC
  Administered 2014-05-11: 1 via ORAL
  Filled 2014-05-11: qty 1

## 2014-05-11 MED ORDER — ALBUTEROL SULFATE (2.5 MG/3ML) 0.083% IN NEBU
2.5000 mg | INHALATION_SOLUTION | RESPIRATORY_TRACT | Status: DC | PRN
  Administered 2014-05-13: 2.5 mg via RESPIRATORY_TRACT
  Filled 2014-05-11 (×2): qty 3

## 2014-05-11 MED ORDER — ENOXAPARIN SODIUM 60 MG/0.6ML ~~LOC~~ SOLN
50.0000 mg | Freq: Two times a day (BID) | SUBCUTANEOUS | Status: DC
  Administered 2014-05-12: 50 mg via SUBCUTANEOUS
  Filled 2014-05-11 (×3): qty 0.6

## 2014-05-11 MED ORDER — IPRATROPIUM BROMIDE 0.02 % IN SOLN
0.5000 mg | Freq: Four times a day (QID) | RESPIRATORY_TRACT | Status: DC
  Administered 2014-05-11: 0.5 mg via RESPIRATORY_TRACT

## 2014-05-11 MED ORDER — FUROSEMIDE 10 MG/ML IJ SOLN
INTRAMUSCULAR | Status: AC
  Filled 2014-05-11: qty 2

## 2014-05-11 MED ORDER — FUROSEMIDE 10 MG/ML IJ SOLN
20.0000 mg | Freq: Once | INTRAMUSCULAR | Status: AC
  Administered 2014-05-11: 20 mg via INTRAVENOUS

## 2014-05-11 MED ORDER — FENTANYL CITRATE 0.05 MG/ML IJ SOLN
50.0000 ug | INTRAMUSCULAR | Status: DC | PRN
  Filled 2014-05-11: qty 2

## 2014-05-11 MED ORDER — SODIUM CHLORIDE 0.9 % IJ SOLN
3.0000 mL | Freq: Two times a day (BID) | INTRAMUSCULAR | Status: DC
  Administered 2014-05-11 – 2014-05-15 (×9): 3 mL via INTRAVENOUS

## 2014-05-11 MED ORDER — METHYLPREDNISOLONE SODIUM SUCC 40 MG IJ SOLR
40.0000 mg | Freq: Two times a day (BID) | INTRAMUSCULAR | Status: DC
  Administered 2014-05-12 – 2014-05-13 (×2): 40 mg via INTRAVENOUS
  Filled 2014-05-11 (×5): qty 1

## 2014-05-11 NOTE — ED Notes (Signed)
I find her to be awake, alert and in no distress.  She is mildly short of breath, but she tells me this is quite normal for her.  The night nurse has just phoned report to [place] also has been notified.

## 2014-05-11 NOTE — Progress Notes (Signed)
Patient came in via EMS on non-re breather in respiratory distress with SATS in 80"s, placed on BiPAP per Dr. Wyvonnia Dusky, patient SATS started to improve to 90's and eventually to 100%, BBS rhonchi-course, patient started that she feels better after BiPAP initiation, SATS 100%, RR 25 BPM, HR 106 BPM, BP 144/120, will continue to monitor patient.

## 2014-05-11 NOTE — Progress Notes (Signed)
Bilateral lower extremity venous duplex completed:  No evidence of DVT, superficial thrombosis, or Baker's cyst.   

## 2014-05-11 NOTE — ED Provider Notes (Signed)
CSN: [id number]     Arrival date & time 05/11/14  1111 History   First MD Initiated Contact with Patient 05/11/14 1120     Chief Complaint  Patient presents with  . Respiratory Distress     (Consider location/radiation/quality/duration/timing/severity/associated sxs/prior Treatment) HPI Comments: Patient from nursing home with decreased mental status and respiratory distress. She was found to be unresponsive with O2 saturations in the 80s. She is placed on nonrebreather and given Narcan. Mental status has improved somewhat. Patient was seen last night for pelvic pain and found to have pelvic fracture. She was given a prescription for narcotics but it is unclear how much she has taken. Level V caveat for respiratory distress and altered mental status. Patient has a history of bronchiectasis and is on home oxygen. She denies any chest pain or abdominal pain.   hx severe chronic sinusitis/bronchectasis on home O2 and chronic steroids baseline 40m daily   The history is provided by the patient and the EMS personnel. The history is limited by the condition of the patient.    Past Medical History  Diagnosis Date  . Unspecified hypothyroidism   . Bronchiectasis with acute exacerbation   . Other and unspecified hyperlipidemia   . TIA (transient ischemic attack)   . Osteoporosis, unspecified   . Asthma   . Esophageal reflux   . IBS (irritable bowel syndrome)   . Diverticulosis   . Hemorrhoids   . Hypothyroidism   . Schatzki's ring   . Hiatal hernia   . Tubular adenoma of colon 12/2008   Past Surgical History  Procedure Laterality Date  . Cholecystectomy  1995  . Nasal sinus surgery    . Tonsillectomy    . Foot surgery    . Small intestine surgery  05/2008   Family History  Problem Relation Age of Onset  . Hypertension Sister   . Heart disease Mother   . Heart disease Father   . Stroke Father   . Multiple myeloma Maternal Aunt   . Colon cancer Mother    History  Substance Use  Topics  . Smoking status: Never Smoker   . Smokeless tobacco: Never Used  . Alcohol Use: No   OB History   Grav Para Term Preterm Abortions TAB SAB Ect Mult Living                 Review of Systems  Unable to perform ROS: Severe respiratory distress      Allergies  Chocolate; Codeine; Erythromycin ethylsuccinate; Iodine; Iohexol; Sulfamethoxazole; and Septra  Home Medications   Prior to Admission medications   Medication Sig Start Date End Date Taking? Authorizing Provider  albuterol (PROVENTIL) (2.5 MG/3ML) 0.083% nebulizer solution Take 2.5 mg by nebulization every 3 (three) hours as needed for wheezing or shortness of breath.  09/18/12 01/28/15 Yes MTanda Rockers MD  ALPRAZolam (Duanne Moron 0.25 MG tablet Take 0.25 mg by mouth 2 (two) times daily as needed for anxiety.    Yes Historical Provider, MD  amoxicillin-clavulanate (AUGMENTIN) 875-125 MG per tablet Take 1 tablet by mouth 2 (two) times daily as needed.   Yes Historical Provider, MD  Azelastine-Fluticasone (DYMISTA) 137-50 MCG/ACT SUSP Place 1 spray into the nose 2 (two) times daily as needed. For seasonal allergies   Yes Historical Provider, MD  beclomethasone (QVAR) 80 MCG/ACT inhaler INHALE 2 PUFFS 2 TIMES A DAY 05/07/14  Yes MTanda Rockers MD  bisoprolol (ZEBETA) 5 MG tablet Take 2.5 mg by mouth daily.   Yes  Historical Provider, MD  calcium-vitamin D (OSCAL WITH D) 500-200 MG-UNIT per tablet Take 1 tablet by mouth 2 (two) times daily.     Yes Historical Provider, MD  cetirizine (ZYRTEC) 10 MG tablet Take 10 mg by mouth at bedtime.   Yes Historical Provider, MD  ciclesonide (OMNARIS) 50 MCG/ACT nasal spray Place 2 sprays into both nostrils 2 (two) times daily.   Yes Historical Provider, MD  clopidogrel (PLAVIX) 75 MG tablet Take 75 mg by mouth daily.   Yes Historical Provider, MD  famotidine (PEPCID) 20 MG tablet Take 20 mg by mouth at bedtime.    Yes Historical Provider, MD  formoterol (PERFOROMIST) 20 MCG/2ML nebulizer  solution Take 20 mcg by nebulization 2 (two) times daily.   Yes Historical Provider, MD  guaiFENesin (MUCINEX) 600 MG 12 hr tablet Take 600 mg by mouth 2 (two) times daily as needed for cough.    Yes Historical Provider, MD  latanoprost (XALATAN) 0.005 % ophthalmic solution Place 1 drop into both eyes at bedtime.  12/14/10  Yes Historical Provider, MD  levothyroxine (SYNTHROID, LEVOTHROID) 100 MCG tablet Take 100 mcg by mouth daily before breakfast.   Yes Historical Provider, MD  nisoldipine (SULAR) 8.5 MG 24 hr tablet Take 8.5 mg by mouth daily.   Yes Historical Provider, MD  oxyCODONE-acetaminophen (PERCOCET/ROXICET) 5-325 MG per tablet Take 1-2 tablets by mouth every 6 (six) hours as needed for severe pain. 05/11/14  Yes Kalman Drape, MD  oxymetazoline (AFRIN) 0.05 % nasal spray Place 1 spray into both nostrils 2 (two) times daily as needed for congestion.    Yes Historical Provider, MD  potassium chloride SA (K-DUR,KLOR-CON) 20 MEQ tablet Take 20 mEq by mouth 2 (two) times daily.   Yes Historical Provider, MD  predniSONE (DELTASONE) 10 MG tablet Take 10 mg by mouth daily with breakfast.   Yes Historical Provider, MD  Probiotic Product (PROBIOTIC DAILY) CAPS Take 1 capsule by mouth every morning.   Yes Historical Provider, MD  Pseudoephedrine-Ibuprofen (ADVIL COLD/SINUS PO) Take 1 tablet by mouth daily as needed (sinus pressure).   Yes Historical Provider, MD  sertraline (ZOLOFT) 25 MG tablet Take 25 mg by mouth daily.   Yes Historical Provider, MD   BP 150/68  Pulse 94  Temp(Src) 97.4 F (36.3 C) (Axillary)  Resp 23  Ht 5' 1.02" (1.55 m)  Wt 106 lb 4.2 oz (48.2 kg)  BMI 20.06 kg/m2  SpO2 97% Physical Exam  Nursing note and vitals reviewed. Constitutional: She is oriented to person, place, and time. She appears well-developed and well-nourished. She appears distressed.  Obtunded, respiratory distress, increased work of breathing  HENT:  Head: Normocephalic and atraumatic.  Mouth/Throat:  Oropharynx is clear and moist. No oropharyngeal exudate.  Eyes: Conjunctivae and EOM are normal. Pupils are equal, round, and reactive to light.  Neck: Normal range of motion. Neck supple.  No meningismus.  Cardiovascular: Normal rate, regular rhythm, normal heart sounds and intact distal pulses.   No murmur heard. Pulmonary/Chest: Breath sounds normal. She is in respiratory distress.  Increased work of breathing with scattered crackles throughout. No appreciable wheezing  Abdominal: Soft. There is no tenderness. There is no rebound and no guarding.  Foley catheter in place  Musculoskeletal: Normal range of motion. She exhibits no edema and no tenderness.  Neurological: She is alert and oriented to person, place, and time. No cranial nerve deficit. She exhibits normal muscle tone. Coordination normal.  No ataxia on finger to nose bilaterally. No pronator  drift. 5/5 strength throughout. CN 2-12 intact. Negative Romberg. Equal grip strength. Sensation intact. Gait is normal.   Skin: Skin is warm.  Psychiatric: She has a normal mood and affect. Her behavior is normal.    ED Course  Procedures (including critical care time) Labs Review Labs Reviewed  CBC WITH DIFFERENTIAL - Abnormal; Notable for the following:    WBC 29.5 (*)    MCH 25.1 (*)    RDW 15.9 (*)    Neutrophils Relative % 81 (*)    Neutro Abs 23.9 (*)    Lymphocytes Relative 6 (*)    Monocytes Absolute 3.6 (*)    All other components within normal limits  COMPREHENSIVE METABOLIC PANEL - Abnormal; Notable for the following:    Sodium 136 (*)    Potassium 3.1 (*)    Chloride 92 (*)    Glucose, Bld 182 (*)    AST 40 (*)    Total Bilirubin 0.2 (*)    GFR calc non Af Amer 58 (*)    GFR calc Af Amer 67 (*)    All other components within normal limits  URINALYSIS, ROUTINE W REFLEX MICROSCOPIC - Abnormal; Notable for the following:    Protein, ur 30 (*)    All other components within normal limits  CBC - Abnormal; Notable  for the following:    WBC 33.8 (*)    MCH 25.1 (*)    RDW 15.8 (*)    All other components within normal limits  CREATININE, SERUM - Abnormal; Notable for the following:    GFR calc non Af Amer 48 (*)    GFR calc Af Amer 56 (*)    All other components within normal limits  LACTIC ACID, PLASMA - Abnormal; Notable for the following:    Lactic Acid, Venous 2.5 (*)    All other components within normal limits  TROPONIN I - Abnormal; Notable for the following:    Troponin I 0.67 (*)    All other components within normal limits  PRO B NATRIURETIC PEPTIDE - Abnormal; Notable for the following:    Pro B Natriuretic peptide (BNP) 1377.0 (*)    All other components within normal limits  D-DIMER, QUANTITATIVE - Abnormal; Notable for the following:    D-Dimer, Quant 11.94 (*)    All other components within normal limits  GLUCOSE, CAPILLARY - Abnormal; Notable for the following:    Glucose-Capillary 191 (*)    All other components within normal limits  I-STAT ARTERIAL BLOOD GAS, ED - Abnormal; Notable for the following:    pH, Arterial 7.305 (*)    pCO2 arterial 65.9 (*)    pO2, Arterial 71.0 (*)    Bicarbonate 32.8 (*)    Acid-Base Excess 4.0 (*)    All other components within normal limits  I-STAT CHEM 8, ED - Abnormal; Notable for the following:    Sodium 135 (*)    Potassium 3.0 (*)    Chloride 93 (*)    Glucose, Bld 176 (*)    All other components within normal limits  I-STAT TROPOININ, ED - Abnormal; Notable for the following:    Troponin i, poc 0.09 (*)    All other components within normal limits  I-STAT ARTERIAL BLOOD GAS, ED - Abnormal; Notable for the following:    pCO2 arterial 56.1 (*)    pO2, Arterial 71.0 (*)    Bicarbonate 32.0 (*)    Acid-Base Excess 5.0 (*)    All other components within normal limits  CULTURE, BLOOD (ROUTINE X 2)  CULTURE, BLOOD (ROUTINE X 2)  PRO B NATRIURETIC PEPTIDE  PROTIME-INR  URINE MICROSCOPIC-ADD ON  PROCALCITONIN  TROPONIN I   TROPONIN I  CBC  BASIC METABOLIC PANEL  I-STAT CG4 LACTIC ACID, ED    Imaging Review Dg Chest 1 View  05/11/2014   CLINICAL DATA:  Fall, shortness of breath.  EXAM: CHEST - 1 VIEW  COMPARISON:  CT of the chest August 13, 2013 and chest radiograph August 07, 2013  FINDINGS: Increased lung volumes, diffuse reticular nodular densities, slightly decreased on the right corresponding to known bronchiectasis. No pleural effusions or focal consolidations. No pneumothorax  The cardiac silhouette appears mildly enlarged, mediastinal silhouette is nonsuspicious.  Chronic right humeral surgical neck fracture.  IMPRESSION: Mild cardiomegaly.  Diffuse chronic change of the lungs corresponding to known bronchiectasis, and mucoid impaction, slightly improved on the right.   Electronically Signed   By: Elon Alas   On: 05/11/2014 04:42   Dg Hip Complete Right  05/11/2014   CLINICAL DATA:  Fall, pain.  EXAM: RIGHT HIP - COMPLETE 2+ VIEW  COMPARISON:  None.  FINDINGS: Mildly displaced right pubic symphysis fracture extending to the superior pubic ramus appears acute. Chronic appearing left superior pubic symphysis fracture. Femoral heads are well formed and located. Hip joint spaces are intact. Moderate to severe right hip osteoarthrosis. Sacroiliac joints are symmetric.  No destructive bony lesions. Included soft tissue planes are non-suspicious. Moderate vascular calcifications. Calcification in the right pelvis likely reflects involuted leiomyoma. Gluteal injection granulomas.  IMPRESSION: Acute mildly displaced right pubic symphyseal fracture extending to superior pubic ramus.  No hip fracture deformity nor dislocation.   Electronically Signed   By: Elon Alas   On: 05/11/2014 04:39   Dg Chest Portable 1 View  05/11/2014   CLINICAL DATA:  Respiratory distress, history bronchiectasis,  EXAM: PORTABLE CHEST - 1 VIEW  COMPARISON:  Portable exam 1140 hr compared earlier study of 02/04 0421 hr and  correlated with prior CT chest 08/13/2013  FINDINGS: Upper normal heart size.  Normal mediastinal contours.  Diffuse chronic reticulonodular infiltrates throughout both lungs greatest at bases.  Scattered peribronchial thickening with known bronchiectasis in lower lobes particularly RIGHT.  Bibasilar atelectasis versus infiltrate greater on RIGHT.  No pleural effusion or pneumothorax.  Bones demineralized with posttraumatic deformity of proximal RIGHT humerus.  IMPRESSION: Chronic peribronchial thickening and interstitial lung disease changes compatible with chronic bronchitis and known bronchiectasis.  Superimposed bibasilar atelectasis versus infiltrate greater on RIGHT.   Electronically Signed   By: Lavonia Dana M.D.   On: 05/11/2014 11:54     EKG Interpretation   Date/Time:  Sunday May 11 2014 11:15:05 EDT Ventricular Rate:  109 PR Interval:  130 QRS Duration: 78 QT Interval:  327 QTC Calculation: 440 R Axis:   21 Text Interpretation:  Normal sinus rhythm ST depressions in inferior  lateral Leads Confirmed by Wyvonnia Dusky  MD, [place] ([id number]) on 05/11/2014  11:28:09 AM      MDM   Final diagnoses:  Acute respiratory failure with hypercapnia  Bronchiectasis with acute exacerbation  Chronic respiratory failure, unspecified whether with hypoxia or hypercapnia   Patient with decreased mental status and respiratory distress on arrival. Discussed with her power of attorney and sister Bethena Roys. Sister states that patient wears O2 around-the-clock and her oxygenation is 90 on a good day. She's not had a discussion for advanced directive. Power of attorney is agreeable to short-term intubation if necessary.  Respiratory acidosis  on ABG.  bipap continued.  Mental status improving. Patient more awake and following commands.  Will hold intubation at this time.  CXR with atelectasis, possible aspiration. levaquin started. Steroids and nebs given.  O2 saturations to mid 90s% on 100% O2 on bipap. New ST  depressions on EKG with positive troponin.  Patient denies chest pain. PE considered, patient has contrast allergy.  Mental status improved on bipap, saturations mid 90s. Patient may need VQ scan and lower extremity dopplers.  Admission d/w Dr. Alva Garnet.   CRITICAL CARE Performed by: Ezequiel Essex Total critical care time: 45 Critical care time was exclusive of separately billable procedures and treating other patients. Critical care was necessary to treat or prevent imminent or life-threatening deterioration. Critical care was time spent personally by me on the following activities: development of treatment plan with patient and/or surrogate as well as nursing, discussions with consultants, evaluation of patient's response to treatment, examination of patient, obtaining history from patient or surrogate, ordering and performing treatments and interventions, ordering and review of laboratory studies, ordering and review of radiographic studies, pulse oximetry and re-evaluation of patient's condition.   Ezequiel Essex, MD 05/11/14 [phone]

## 2014-05-11 NOTE — ED Notes (Addendum)
Pt placed on Bipap. MD at bedside. Sats 91% on bipap

## 2014-05-11 NOTE — Progress Notes (Signed)
Increased O2 back to 60% due to desaturation of 82% on 40% O2.

## 2014-05-11 NOTE — Consult Note (Signed)
PHARMACY CONSULT NOTE   Pharmacy Consult : Lovenox, Treatment Doses Indication :  Rule Out  Allergies  Allergen Reactions  . Chocolate Cough  . Codeine     Unknown reaction  . Erythromycin Ethylsuccinate     REACTION: unspecified  . Iodine   . Iohexol      Desc: pt states allergy many years ago--unable to remember type of allergy--pre med in future slg 06/13/08   . Sulfamethoxazole     REACTION: unspecified  . Septra [Bactrim] Itching and Rash    Patient Measurements: Height: 5' 1.02" (155 cm) Weight: 106 lb 4.2 oz (48.2 kg) IBW/kg (Calculated) : 47.86 Heparin Dosing Weight: 48 kg  Vital Signs: Temp: 97.4 F (36.3 C) (08/16 1115) Temp src: Axillary (08/16 1115) BP: 154/80 mmHg (08/16 1600) Pulse Rate: 161 (08/16 1600)  Labs:  Recent Labs  05/11/14 1122 05/11/14 1127 05/11/14 1430  HGB 12.7 14.6 12.7  HCT 42.3 43.0 41.8  PLT 372  --  351  LABPROT 12.8  --   --   INR 0.96  --   --   CREATININE 0.92 0.90 1.07  TROPONINI  --   --  0.67*   Estimated Creatinine Clearance: 32.8 ml/min (by C-G formula based on Cr of 1.07).  Medical History: Past Medical History  Diagnosis Date  . Unspecified hypothyroidism   . Bronchiectasis with acute exacerbation   . Other and unspecified hyperlipidemia   . TIA (transient ischemic attack)   . Osteoporosis, unspecified   . Asthma   . Esophageal reflux   . IBS (irritable bowel syndrome)   . Diverticulosis   . Hemorrhoids   . Hypothyroidism   . Schatzki's ring   . Hiatal hernia   . Tubular adenoma of colon 12/2008    Current Medication[s] Include: Medication PTA: Medication Sig  . albuterol (PROVENTIL) (2.5 MG/3ML) 0.083% nebulizer solution Take 2.5 mg by nebulization every 3 (three) hours as needed for wheezing or shortness of breath.   . ALPRAZolam (XANAX) 0.25 MG tablet Take 0.25 mg by mouth 2 (two) times daily as needed for anxiety.   Marland Kitchen amoxicillin-clavulanate (AUGMENTIN) 875-125 MG per tablet Take 1 tablet by mouth  2 (two) times daily as needed.  . Azelastine-Fluticasone (DYMISTA) 137-50 MCG/ACT SUSP Place 1 spray into the nose 2 (two) times daily as needed. For seasonal allergies  . beclomethasone (QVAR) 80 MCG/ACT inhaler INHALE 2 PUFFS 2 TIMES A DAY  . bisoprolol (ZEBETA) 5 MG tablet Take 2.5 mg by mouth daily.  . calcium-vitamin D (OSCAL WITH D) 500-200 MG-UNIT per tablet Take 1 tablet by mouth 2 (two) times daily.    . cetirizine (ZYRTEC) 10 MG tablet Take 10 mg by mouth at bedtime.  . ciclesonide (OMNARIS) 50 MCG/ACT nasal spray Place 2 sprays into both nostrils 2 (two) times daily.  . clopidogrel (PLAVIX) 75 MG tablet Take 75 mg by mouth daily.  . famotidine (PEPCID) 20 MG tablet Take 20 mg by mouth at bedtime.   . formoterol (PERFOROMIST) 20 MCG/2ML nebulizer solution Take 20 mcg by nebulization 2 (two) times daily.  Marland Kitchen guaiFENesin (MUCINEX) 600 MG 12 hr tablet Take 600 mg by mouth 2 (two) times daily as needed for cough.   . latanoprost (XALATAN) 0.005 % ophthalmic solution Place 1 drop into both eyes at bedtime.   Marland Kitchen levothyroxine (SYNTHROID, LEVOTHROID) 100 MCG tablet Take 100 mcg by mouth daily before breakfast.  . nisoldipine (SULAR) 8.5 MG 24 hr tablet Take 8.5 mg by mouth daily.  Marland Kitchen  oxyCODONE-acetaminophen (PERCOCET/ROXICET) 5-325 MG per tablet Take 1-2 tablets by mouth every 6 (six) hours as needed for severe pain.  Marland Kitchen oxymetazoline (AFRIN) 0.05 % nasal spray Place 1 spray into both nostrils 2 (two) times daily as needed for congestion.   . potassium chloride SA (K-DUR,KLOR-CON) 20 MEQ tablet Take 20 mEq by mouth 2 (two) times daily.  . predniSONE (DELTASONE) 10 MG tablet Take 10 mg by mouth daily with breakfast.  . Probiotic Product (PROBIOTIC DAILY) CAPS Take 1 capsule by mouth every morning.  . Pseudoephedrine-Ibuprofen (ADVIL COLD/SINUS PO) Take 1 tablet by mouth daily as needed (sinus pressure).  . sertraline (ZOLOFT) 25 MG tablet Take 25 mg by mouth daily.    Scheduled:  Scheduled:  .  albuterol  2.5 mg Nebulization Q6H  . budesonide  0.25 mg Nebulization 4 times per day  . cefTAZidime (FORTAZ)  IV  1 g Intravenous Q12H  . [START ON 05/12/2014] enoxaparin  50 mg Subcutaneous Q12H  . ipratropium  0.5 mg Nebulization Q6H  . methylPREDNISolone (SOLU-MEDROL) injection  40 mg Intravenous Q12H  . pantoprazole (PROTONIX) IV  40 mg Intravenous QHS  . potassium chloride  10 mEq Intravenous Q1 Hr x 2  . sodium chloride  3 mL Intravenous Q12H  . vancomycin  750 mg Intravenous Q24H   Infusion[s]: Infusions:   Antibiotic[s]: Anti-infectives   Start     Dose/Rate Route Frequency Ordered Stop   05/11/14 1500  vancomycin (VANCOCIN) IVPB 750 mg/150 ml premix     750 mg 150 mL/hr over 60 Minutes Intravenous Every 24 hours 05/11/14 1422     05/11/14 1500  cefTAZidime (FORTAZ) 1 g in dextrose 5 % 50 mL IVPB     1 g 100 mL/hr over 30 Minutes Intravenous Every 12 hours 05/11/14 1422     05/11/14 1300  levofloxacin (LEVAQUIN) IVPB 500 mg     500 mg 100 mL/hr over 60 Minutes Intravenous  Once 05/11/14 1204 05/11/14 1442      Assessment:  80 y/o female with severe chronic sinusitis/brochectasis on home O2 and chronic steroids who has transferred from [facility] with respiratory distress, lethargy, and decreased mentation.  She has been placed on Vancomycin and Fortaz.  Also concern for possible PE.  Lovenox, full dosing, has been ordered.  Patient has already received Lovenox 40 mg x 1.    Patient dosing weight 48 kg and currently with estimated CrCl ~ 33 ml/min.  Goal of Therapy:   Anti-Xa level 0.6-1 units/ml 4hrs after LMWH dose given   Plan:  1. Since patient has received initial Lovenox dose within last 30 minutes, will not give any additional Lovenox but will begin next dose in 8 hours [vs 12 hours]. 2. Will give Lovenox 50 mg [1 mg/kg ] q 12 hours. 3. Follow up BMET in AM to determine any renal adjustment needs.  Aiden Helzer, Craig Guess,  Pharm.D       05/11/2014,4:46 PM

## 2014-05-11 NOTE — ED Notes (Signed)
Patient in Xray

## 2014-05-11 NOTE — ED Notes (Signed)
Patient presents via EMS for fall. Patient reports tripping over oxygen tubing and falling on buttocks. Patient denies LOC at time of fall, denies dizziness. Patient reports pain 3/10 to lower back. Patient denies lightheadedness, CP, SOB at this time. Patient denies HA, neck pain. Patient is oxygen dependent and wears 4 L [place].

## 2014-05-11 NOTE — Discharge Instructions (Signed)
Patient will need physical therapy and occupational therapy to assess the patient for right sided pubic rami fracture, pain with walking and deconditioning.  Please increase your oxygen to 4 liters while you are recovering from your injuries.   Stable Pelvic Fracture You have one or more fractures (this means there is a break in the bones) of the pelvis. The pelvis is the ring of bones that make up your hipbones. These are the bones you sit on and the lower part of the spine. It is like a boney ring where your legs attach and which supports your upper body. You have an undisplaced fracture. This means the bones are in good position. The pelvic fracture you have is a simple (uncomplicated) fracture. DIAGNOSIS  X-rays usually diagnose these fractures. TREATMENT  The goal of treating pelvic fractures is to get the bones to heal in a good position. The patient should return to normal activities as soon as possible. Such fractures are often treated with normal bed rest and conservative measures.  HOME CARE INSTRUCTIONS   You should be on bed rest for as long as directed by your caregiver. Change positions of your legs every 1-2 hours to maintain good blood flow. You may sit as long as is tolerable. Following this, you may do usual activities, but avoid strenuous activities for as long as directed by your caregiver.  Only take over-the-counter or prescription medicines for pain, discomfort, or fever as directed by your caregiver.  Bed rest may also be used for discomfort.  Resume your activities when you are able. Use a cane or crutch on the injured side to reduce pain while walking, as needed.  If you develop increased pain or discomfort not relieved with medications, contact your caregiver.  Warning: Do not drive a car or operate a motor vehicle until your caregiver specifically tells you it is safe to do so. SEEK IMMEDIATE MEDICAL CARE IF:   You feel light-headed or faint, develop chest pain or  shortness of breath.  An unexplained oral temperature above 102 F (38.9 C) develops.  You develop blood in the urine or in the stools.  There is difficulty urinating, and/or having a bowel movement, or pain with these efforts.  There is a difficulty or increased pain with walking.  There is swelling in one or both legs that is not normal. Document Released: 11/21/2001 Document Revised: 01/27/2014 Document Reviewed: 04/25/2008 [facility] Patient Information 2015 [place], [place]. This information is not intended to replace advice given to you by your health care provider. Make sure you discuss any questions you have with your health care provider.

## 2014-05-11 NOTE — Progress Notes (Signed)
ANTIBIOTIC CONSULT NOTE - INITIAL  Pharmacy Consult for vanc/fortaz Indication: pneumonia  Allergies  Allergen Reactions  . Chocolate Cough  . Codeine     Unknown reaction  . Erythromycin Ethylsuccinate     REACTION: unspecified  . Iodine   . Iohexol      Desc: pt states allergy many years ago--unable to remember type of allergy--pre med in future slg 06/13/08   . Sulfamethoxazole     REACTION: unspecified  . Septra [Bactrim] Itching and Rash    Patient Measurements: BW ~105lb? Adjusted Body Weight:   Vital Signs: Temp: 97.4 F (36.3 C) (08/16 1115) Temp src: Axillary (08/16 1115) BP: 154/45 mmHg (08/16 1330) Pulse Rate: 111 (08/16 1330) Intake/Output from previous day:   Intake/Output from this shift: Total I/O In: -  Out: 600 [Urine:600]  Labs:  Recent Labs  05/11/14 1122 05/11/14 1127  WBC 29.5*  --   HGB 12.7 14.6  PLT 372  --   CREATININE 0.92 0.90   The CrCl is unknown because both a height and weight (above a minimum accepted value) are required for this calculation. No results found for this basename: VANCOTROUGH, VANCOPEAK, VANCORANDOM, GENTTROUGH, GENTPEAK, GENTRANDOM, TOBRATROUGH, TOBRAPEAK, TOBRARND, AMIKACINPEAK, AMIKACINTROU, AMIKACIN,  in the last 72 hours   Microbiology: No results found for this or any previous visit (from the past 720 hour(s)).  Medical History: Past Medical History  Diagnosis Date  . Unspecified hypothyroidism   . Bronchiectasis with acute exacerbation   . Other and unspecified hyperlipidemia   . TIA (transient ischemic attack)   . Osteoporosis, unspecified   . Asthma   . Esophageal reflux   . IBS (irritable bowel syndrome)   . Diverticulosis   . Hemorrhoids   . Hypothyroidism   . Schatzki's ring   . Hiatal hernia   . Tubular adenoma of colon 12/2008    Medications:   (Not in a hospital admission) Scheduled:  . enoxaparin (LOVENOX) injection  40 mg Subcutaneous Q24H  . methylPREDNISolone (SOLU-MEDROL)  injection  40 mg Intravenous Q12H  . sodium chloride  3 mL Intravenous Q12H   Infusions:  . sodium chloride    . sodium chloride    . potassium chloride     Assessment: 80 yo who presented to the ED after a fall and pubic fx. She was later found in resp distress. Vanc/fortaz have been ordered to cover for PNA.   Goal of Therapy:  Vancomycin trough level 15-20 mcg/ml  Plan:   Vanc 750mg  IV q24 Fortaz 1g q12 Level as needed

## 2014-05-11 NOTE — ED Notes (Signed)
Bed: YE18 Expected date:  Expected time:  Means of arrival:  Comments: EMS 80yo F, fall at [facility], c/o rt lower back / hip pain

## 2014-05-11 NOTE — ED Notes (Signed)
Notified RN on 33M that antibiotics, pulmicort, and lovenox have not come from pharmacy yet. These medications still need to be given

## 2014-05-11 NOTE — ED Notes (Signed)
Troponin results given to Dr. Wyvonnia Dusky

## 2014-05-11 NOTE — H&P (Signed)
PULMONARY / CRITICAL CARE MEDICINE   Name: [name] MRN: [id number] DOB: 09/01/1933    ADMISSION DATE:  05/11/2014  REFERRING MD :  EDP   CHIEF COMPLAINT: Resp distress   INITIAL PRESENTATION: 80yo WF [name] pt never smoker with severe chronic sinusitis/bronchectasis on home O2 and chronic steroids baseline 10mg daily presented to ER 8/16 [facility] fall at [facility] found to have right pubic ramus fx she was given pain meds and transitioned to rehab part at [facility] in which she was found later in am with resp distress, lethargic and decreased mentation, brought back to ER , placed on BIPAP . PCCM asked to admit .    STUDIES:    SIGNIFICANT EVENTS:  HISTORY OF PRESENT ILLNESS:   80yo WF [name] pt never smoker with severe chronic sinusitis/bronchectasis on home O2 and chronic steroids baseline 10mg daily presented to ER 8/16 after fall at [facility] found to have right pubic ramus fx she was given pain meds and transitioned to rehab part at [facility] in which she was found later in am with resp distress with increased wob and hypoxia , lethargic and decreased mentation, brought back to ER , placed on BIPAP  W/ improved saturation, mentation slightly improved. Given narcan ,  EDP discussed with  HCPOA would like brief intubation if necessary . PCO2 was 65.  She was started on  Nebs and given solumedrol 125mg IV , lasix x 1 and started on IV abx.  PCCM asked to admit .     PAST MEDICAL HISTORY :  Past Medical History  Diagnosis Date  . Unspecified hypothyroidism   . Bronchiectasis with acute exacerbation   . Other and unspecified hyperlipidemia   . TIA (transient ischemic attack)   . Osteoporosis, unspecified   . Asthma   . Esophageal reflux   . IBS (irritable bowel syndrome)   . Diverticulosis   . Hemorrhoids   . Hypothyroidism   . Schatzki's ring   . Hiatal hernia   . Tubular adenoma of colon 12/2008   Past Surgical History  Procedure Laterality Date  . Cholecystectomy  1995  . Nasal  sinus surgery    . Tonsillectomy    . Foot surgery    . Small intestine surgery  05/2008   Prior to Admission medications   Medication Sig Start Date End Date Taking? Authorizing Provider  albuterol (PROVENTIL) (2.5 MG/3ML) 0.083% nebulizer solution Take 2.5 mg by nebulization every 3 (three) hours as needed for wheezing or shortness of breath.  09/18/12 01/28/15 Yes Michael B [name], MD  ALPRAZolam (XANAX) 0.25 MG tablet Take 0.25 mg by mouth 2 (two) times daily as needed for anxiety.    Yes Historical Provider, MD  amoxicillin-clavulanate (AUGMENTIN) 875-125 MG per tablet Take 1 tablet by mouth 2 (two) times daily as needed.   Yes Historical Provider, MD  Azelastine-Fluticasone (DYMISTA) 137-50 MCG/ACT SUSP Place 1 spray into the nose 2 (two) times daily as needed. For seasonal allergies   Yes Historical Provider, MD  beclomethasone (QVAR) 80 MCG/ACT inhaler INHALE 2 PUFFS 2 TIMES A DAY 05/07/14  Yes Michael B [name], MD  bisoprolol (ZEBETA) 5 MG tablet Take 2.5 mg by mouth daily.   Yes Historical Provider, MD  calcium-vitamin D (OSCAL WITH D) 500-200 MG-UNIT per tablet Take 1 tablet by mouth 2 (two) times daily.     Yes Historical Provider, MD  cetirizine (ZYRTEC) 10 MG tablet Take 10 mg by mouth at bedtime.   Yes Historical Provider,   MD  ciclesonide (OMNARIS) 50 MCG/ACT nasal spray Place 2 sprays into both nostrils 2 (two) times daily.   Yes Historical Provider, MD  clopidogrel (PLAVIX) 75 MG tablet Take 75 mg by mouth daily.   Yes Historical Provider, MD  famotidine (PEPCID) 20 MG tablet Take 20 mg by mouth at bedtime.    Yes Historical Provider, MD  formoterol (PERFOROMIST) 20 MCG/2ML nebulizer solution Take 20 mcg by nebulization 2 (two) times daily.   Yes Historical Provider, MD  guaiFENesin (MUCINEX) 600 MG 12 hr tablet Take 600 mg by mouth 2 (two) times daily as needed for cough.    Yes Historical Provider, MD  latanoprost (XALATAN) 0.005 % ophthalmic solution Place 1 drop into both eyes at  bedtime.  12/14/10  Yes Historical Provider, MD  levothyroxine (SYNTHROID, LEVOTHROID) 100 MCG tablet Take 100 mcg by mouth daily before breakfast.   Yes Historical Provider, MD  nisoldipine (SULAR) 8.5 MG 24 hr tablet Take 8.5 mg by mouth daily.   Yes Historical Provider, MD  oxyCODONE-acetaminophen (PERCOCET/ROXICET) 5-325 MG per tablet Take 1-2 tablets by mouth every 6 (six) hours as needed for severe pain. 05/11/14  Yes Kalman Drape, MD  oxymetazoline (AFRIN) 0.05 % nasal spray Place 1 spray into both nostrils 2 (two) times daily as needed for congestion.    Yes Historical Provider, MD  potassium chloride SA (K-DUR,KLOR-CON) 20 MEQ tablet Take 20 mEq by mouth 2 (two) times daily.   Yes Historical Provider, MD  predniSONE (DELTASONE) 10 MG tablet Take 10 mg by mouth daily with breakfast.   Yes Historical Provider, MD  Probiotic Product (PROBIOTIC DAILY) CAPS Take 1 capsule by mouth every morning.   Yes Historical Provider, MD  Pseudoephedrine-Ibuprofen (ADVIL COLD/SINUS PO) Take 1 tablet by mouth daily as needed (sinus pressure).   Yes Historical Provider, MD  sertraline (ZOLOFT) 25 MG tablet Take 25 mg by mouth daily.   Yes Historical Provider, MD   Allergies  Allergen Reactions  . Chocolate Cough  . Codeine     Unknown reaction  . Erythromycin Ethylsuccinate     REACTION: unspecified  . Iodine   . Iohexol      Desc: pt states allergy many years ago--unable to remember type of allergy--pre med in future slg 06/13/08   . Sulfamethoxazole     REACTION: unspecified  . Septra [Bactrim] Itching and Rash    FAMILY HISTORY:  Family History  Problem Relation Age of Onset  . Hypertension Sister   . Heart disease Mother   . Heart disease Father   . Stroke Father   . Multiple myeloma Maternal Aunt   . Colon cancer Mother    SOCIAL HISTORY:  reports that she has never smoked. She has never used smokeless tobacco. She reports that she does not drink alcohol or use illicit drugs.  REVIEW  OF SYSTEMS:  Unable to obtain as pt is on BIPAP and lethargic, ER records/epic notes reviewed   SUBJECTIVE:  Resp Distress   VITAL SIGNS: Temp:  [97.4 F (36.3 C)-98.7 F (37.1 C)] 97.4 F (36.3 C) (08/16 1115) Pulse Rate:  [91-108] 108 (08/16 1245) Resp:  [18-32] 25 (08/16 1301) BP: (127-209)/(59-127) 127/59 mmHg (08/16 1301) SpO2:  [84 %-100 %] 99 % (08/16 1301) FiO2 (%):  [100 %] 100 % (08/16 1139) HEMODYNAMICS:   VENTILATOR SETTINGS: Vent Mode:  [-]  FiO2 (%):  [100 %] 100 % INTAKE / OUTPUT:  Intake/Output Summary (Last 24 hours) at 05/11/14 1326 Last data filed  at 05/11/14 1127  Gross per 24 hour  Intake      0 ml  Output    600 ml  Net   -600 ml    PHYSICAL EXAMINATION: GEN:  Lethargic but follows commands, on BIPAP   HEENT:  [place]/AT,  Dry mucosa    NECK:  Supple w/ fair ROM; no JVD; normal carotid impulses w/o bruits; no thyromegaly or nodules palpated; no lymphadenopathy.  RESP  Coarse Rhonchi bilaterally +accessory muscle use, no dullness to percussion  CARD:  ST , no m/r/g  , no peripheral edema, pulses intact, no cyanosis or clubbing.  GI:   Soft & nt; nml bowel sounds; no organomegaly or masses detected.  Musco: Warm bil, no deformities or joint swelling noted.   Neuro: alert, no focal deficits noted.    Skin: Warm, no lesions or rashes   LABS:  CBC  Recent Labs Lab 05/11/14 1122 05/11/14 1127  WBC 29.5*  --   HGB 12.7 14.6  HCT 42.3 43.0  PLT 372  --    Coag's  Recent Labs Lab 05/11/14 1122  INR 0.96   BMET  Recent Labs Lab 05/11/14 1122 05/11/14 1127  NA 136* 135*  K 3.1* 3.0*  CL 92* 93*  CO2 29  --   BUN 12 11  CREATININE 0.92 0.90  GLUCOSE 182* 176*   Electrolytes  Recent Labs Lab 05/11/14 1122  CALCIUM 9.2   Sepsis Markers  Recent Labs Lab 05/11/14 1128  LATICACIDVEN 1.50   ABG  Recent Labs Lab 05/11/14 1126  PHART 7.305*  PCO2ART 65.9*  PO2ART 71.0*   Liver Enzymes  Recent Labs Lab  05/11/14 1122  AST 40*  ALT 35  ALKPHOS 74  BILITOT 0.2*  ALBUMIN 3.5   Cardiac Enzymes  Recent Labs Lab 05/11/14 1122  PROBNP 442.9   Glucose No results found for this basename: GLUCAP,  in the last 168 hours  Imaging CXR chronic changes ? Increased patch opacity/atx in right    ASSESSMENT / PLAN:  PULMONARY  A:Acute on Chronic Hypoxic Hypercarbic Resp Failure w/ underlying chronic steroid/oxygen dependent bronchiectasis ? Exacerbation  >acute worsening w/ resp distress post fall w/ pubic fx and presumed narcotic use . Concern for possible aspiration event w/ elevated wbc vs possible PE +/- hypoventilation with oversedation with narcotic   P:   Continue full BIPAP support  Repeat ABG on BIPAP  Low threshold to intubate if mentation worsens or further decline  Check D dimer , if positive will need CT angio  Continue on BD  Begin IV steroids for possible flare at 40mg q12 -on baseline pred 10mg daily  Broaden abx see ID section   CARDIOVASCULAR  A: HTN  P:  Hold HTN rx for now  Hold plavix for now  Lovenox for DVT  Tr troponin , check bnp   RENAL A:  Hypokalemia  P:   Replete K+   GASTROINTESTINAL A:  NPO  P:   PPI for sup   HEMATOLOGIC A:   P:  Monitor cbc   INFECTIOUS A:  Possible Asp PNA w/ elevated WBC   P:   8/16 BCx2 >> Abx: Vanco , start date 8/16 , day 1/ Ceftaz 8/16 , day 1/  Check PCT /LA    ENDOCRINE A:  Hyperglycemia   P:  add SSI if BS >200    NEUROLOGIC A:  Lethargic ? Secondary to hypercarbia , narcotic oversedation  P:   Monitor closely   Hold   narcotics to avoid oversedation  Hold home zoloft and xanax    TODAY'S SUMMARY: 80 yo wf with steroid/o2 dependent bronchiectasis s/p fall at NH with pubic fx released from ER on narcotics returned shortly back to ER in resp distress, obtundation. Placed on BIPAP, w/ diuresis and narcan . D dimer significantly elevated. Has contrast allergy. Will initiate full dose enoxaparin  and order LE venous Dopplers. If negative will need to consider pre-med protocol for CTA chest.    PARRETT,[name]-C  Pulmonary and Critical Care Medicine [facility] Pager: [phone]   I have personally obtained a history, examined the patient, evaluated laboratory and imaging results, formulated the assessment and plan and placed orders.   , MD ; PCCM service Mobile [phone].  After 5:30 PM or weekends, call [phone]  05/11/2014, 1:26 PM     

## 2014-05-11 NOTE — Progress Notes (Signed)
Unit CM UR Completed by [facility] ED CM  W. Donzell Coller RN  

## 2014-05-11 NOTE — ED Notes (Signed)
Patient 02 77% on 3L. Dr. Sharol Given made aware of same, at bedside now. 4L applied [place]. Applied ped's pulse ox. Patient sat 93% on 4L. Patient denies SOB at this time. A&O x4.

## 2014-05-11 NOTE — ED Notes (Signed)
Chem 8 and lactic acid results given to Dr. Wyvonnia Dusky

## 2014-05-11 NOTE — Progress Notes (Signed)
Patient received on servo-i NIV BiPAP.  Patient switched to the documented settings due to asynchrony on NIV PS per nurse practitioner order.  Pt stable, returns good tidal volumes with stable SpO2, B/P, and pulse.

## 2014-05-11 NOTE — ED Notes (Signed)
Pt still on Bipap. Pt more responsive- responding to verbal stimuli. Pt able to follow commands when strongly prompted. Pt unable to fully answer questions due to bipap.

## 2014-05-11 NOTE — ED Notes (Signed)
Pt's POA Dierdre Highman (sister) cell number [phone]

## 2014-05-11 NOTE — Progress Notes (Signed)
eLink Physician-Brief Progress Note Patient Name: [name] DOB: Jun 11, 1933 MRN: [id number]   Date of Service  05/11/2014  HPI/Events of Note  80 yr old female with severe bronchiectasis admitted with hypoxemic and hypercarbic resp failure.  Patient had recent pelvic fx and was discharged on pain medication.  resp failure felt to be related to narcotics and underlying severe lung disease.  On empiric treatment for exacerbation.  Generally doing better since admit from mental status standpoint.  She remains on bipap.  eICU Interventions  Continue present treatment plan     Intervention Category Intermediate Interventions: Respiratory distress - evaluation and management  Mauri Brooklyn, P 05/11/2014, 8:00 PM

## 2014-05-11 NOTE — ED Notes (Signed)
Pt seen last night at [facility] for right hip fx. EMS called today for respiratory distress. Pt lives at [facility]. Staff found pt unresponsive, resp distress. Initial sats low 80's. EMS placed on nonrebreather sats 93%. BP 150/70, CBG 255, HR 110. EMS gave 2mg  narcan. Pt became more responsive- mumbling answers

## 2014-05-11 NOTE — ED Provider Notes (Signed)
CSN: [id number]     Arrival date & time 05/11/14  0245 History   First MD Initiated Contact with Patient 05/11/14 0300     Chief Complaint  Patient presents with  . Fall     (Consider location/radiation/quality/duration/timing/severity/associated sxs/prior Treatment) HPI 80 yo female presents to the ER from her assisted living facility after fall.  Pt reports she tripped over her oxygen tubing and landed on her buttocks.  No head injury, no LOC.  Pt reports pain in lower back and in right groin.  She has not been able to walk or bear weight since her fall.  Pt reports she has seen [facility] in the past.   Past Medical History  Diagnosis Date  . Unspecified hypothyroidism   . Bronchiectasis with acute exacerbation   . Other and unspecified hyperlipidemia   . TIA (transient ischemic attack)   . Osteoporosis, unspecified   . Asthma   . Esophageal reflux   . IBS (irritable bowel syndrome)   . Diverticulosis   . Hemorrhoids   . Hypothyroidism   . Schatzki's ring   . Hiatal hernia   . Tubular adenoma of colon 12/2008   Past Surgical History  Procedure Laterality Date  . Cholecystectomy  1995  . Nasal sinus surgery    . Tonsillectomy    . Foot surgery    . Small intestine surgery  05/2008   Family History  Problem Relation Age of Onset  . Hypertension Sister   . Heart disease Mother   . Heart disease Father   . Stroke Father   . Multiple myeloma Maternal Aunt   . Colon cancer Mother    History  Substance Use Topics  . Smoking status: Never Smoker   . Smokeless tobacco: Never Used  . Alcohol Use: No   OB History   Grav Para Term Preterm Abortions TAB SAB Ect Mult Living                 Review of Systems  All other systems reviewed and are negative.     Allergies  Chocolate; Codeine; Erythromycin ethylsuccinate; Iodine; Iohexol; Sulfamethoxazole; and Septra  Home Medications   Prior to Admission medications   Medication Sig Start Date End Date Taking?  Authorizing Provider  ALPRAZolam (XANAX) 0.25 MG tablet Take 0.25 mg by mouth 2 (two) times daily as needed for anxiety.    Yes Historical Provider, MD  beclomethasone (QVAR) 80 MCG/ACT inhaler INHALE 2 PUFFS 2 TIMES A DAY 05/07/14  Yes Tanda Rockers, MD  bisoprolol (ZEBETA) 5 MG tablet Take 2.5 mg by mouth daily.   Yes Historical Provider, MD  calcium-vitamin D (OSCAL WITH D) 500-200 MG-UNIT per tablet Take 1 tablet by mouth 2 (two) times daily.     Yes Historical Provider, MD  cetirizine (ZYRTEC) 10 MG tablet Take 10 mg by mouth at bedtime.   Yes Historical Provider, MD  clopidogrel (PLAVIX) 75 MG tablet Take 75 mg by mouth daily.   Yes Historical Provider, MD  famotidine (PEPCID) 20 MG tablet Take 20 mg by mouth at bedtime.    Yes Historical Provider, MD  lansoprazole (PREVACID) 15 MG capsule Take 15 mg by mouth 2 (two) times daily before a meal.   Yes Historical Provider, MD  latanoprost (XALATAN) 0.005 % ophthalmic solution Place 1 drop into both eyes at bedtime.  12/14/10  Yes Historical Provider, MD  levothyroxine (SYNTHROID, LEVOTHROID) 100 MCG tablet Take 100 mcg by mouth daily before breakfast.   Yes  Historical Provider, MD  nisoldipine (SULAR) 8.5 MG 24 hr tablet Take 8.5 mg by mouth daily.   Yes Historical Provider, MD  potassium chloride SA (K-DUR,KLOR-CON) 20 MEQ tablet Take 20 mEq by mouth 2 (two) times daily.   Yes Historical Provider, MD  Probiotic Product (PROBIOTIC DAILY) CAPS Take 1 capsule by mouth every morning.   Yes Historical Provider, MD  sertraline (ZOLOFT) 25 MG tablet Take 25 mg by mouth daily.   Yes Historical Provider, MD  albuterol (PROVENTIL) (2.5 MG/3ML) 0.083% nebulizer solution Take 2.5 mg by nebulization every 3 (three) hours as needed for wheezing or shortness of breath.  09/18/12 01/28/15  Nyoka Cowden, MD  Azelastine-Fluticasone [facility]) 137-50 MCG/ACT SUSP Place 1 spray into the nose 2 (two) times daily as needed. For seasonal allergies    Historical Provider,  MD  guaiFENesin (MUCINEX) 600 MG 12 hr tablet Take 600 mg by mouth 2 (two) times daily as needed for cough.     Historical Provider, MD  oxymetazoline (AFRIN) 0.05 % nasal spray Place 1 spray into both nostrils 2 (two) times daily as needed for congestion.     Historical Provider, MD   BP 209/93  Pulse 91  Temp(Src) 97.7 F (36.5 C) (Oral)  Resp 18  SpO2 90% Physical Exam  Nursing note and vitals reviewed. Constitutional: She is oriented to person, place, and time. She appears distressed.  Chronically ill appearing female, uncomfortable appearing  HENT:  Head: Normocephalic and atraumatic.  Nose: Nose normal.  Mouth/Throat: Oropharynx is clear and moist.  Eyes: Conjunctivae and EOM are normal. Pupils are equal, round, and reactive to light.  Cardiovascular: Normal rate, regular rhythm, normal heart sounds and intact distal pulses.  Exam reveals no gallop and no friction rub.   No murmur heard. Pulmonary/Chest: Effort normal. No respiratory distress. She exhibits no tenderness.  Coarse breath sounds, prolonged expiration  Abdominal: Soft. Bowel sounds are normal. She exhibits no distension and no mass. There is no tenderness. There is no rebound and no guarding.  Musculoskeletal: She exhibits tenderness. She exhibits no edema.  No shortening or rotation of right leg.  Pain with ROM of right leg with flexion/extension at hip.  No pain with rotation of leg.  NVI distally.   Spine palpated, no stepoff or crepitus  Neurological: She is alert and oriented to person, place, and time.  Skin: Skin is warm and dry. No rash noted. No erythema. No pallor.    ED Course  Procedures (including critical care time) Labs Review Labs Reviewed  URINALYSIS, ROUTINE W REFLEX MICROSCOPIC - Abnormal; Notable for the following:    Hgb urine dipstick SMALL (*)    Leukocytes, UA SMALL (*)    All other components within normal limits  URINE MICROSCOPIC-ADD ON - Abnormal; Notable for the following:     Bacteria, UA FEW (*)    All other components within normal limits    Imaging Review Dg Chest 1 View  05/11/2014   CLINICAL DATA:  Fall, shortness of breath.  EXAM: CHEST - 1 VIEW  COMPARISON:  CT of the chest August 13, 2013 and chest radiograph August 07, 2013  FINDINGS: Increased lung volumes, diffuse reticular nodular densities, slightly decreased on the right corresponding to known bronchiectasis. No pleural effusions or focal consolidations. No pneumothorax  The cardiac silhouette appears mildly enlarged, mediastinal silhouette is nonsuspicious.  Chronic right humeral surgical neck fracture.  IMPRESSION: Mild cardiomegaly.  Diffuse chronic change of the lungs corresponding to known bronchiectasis, and  mucoid impaction, slightly improved on the right.   Electronically Signed   By: Elon Alas   On: 05/11/2014 04:42   Dg Hip Complete Right  05/11/2014   CLINICAL DATA:  Fall, pain.  EXAM: RIGHT HIP - COMPLETE 2+ VIEW  COMPARISON:  None.  FINDINGS: Mildly displaced right pubic symphysis fracture extending to the superior pubic ramus appears acute. Chronic appearing left superior pubic symphysis fracture. Femoral heads are well formed and located. Hip joint spaces are intact. Moderate to severe right hip osteoarthrosis. Sacroiliac joints are symmetric.  No destructive bony lesions. Included soft tissue planes are non-suspicious. Moderate vascular calcifications. Calcification in the right pelvis likely reflects involuted leiomyoma. Gluteal injection granulomas.  IMPRESSION: Acute mildly displaced right pubic symphyseal fracture extending to superior pubic ramus.  No hip fracture deformity nor dislocation.   Electronically Signed   By: Elon Alas   On: 05/11/2014 04:39     EKG Interpretation None      MDM   Final diagnoses:  Pubic ramus fracture, right, closed, initial encounter    80 year old female status post fall at her assisted living facility, patient has right groin and  hip pain with movement.  Concern for possible hip fracture.  Will check right hip and chest x-ray given her lung disease.  Patient requesting Foley catheter as she frequently urinates and does not want to have to sit on the bed pan.  Patient has a right pubic ramus fracture.  In a patient with multiple comorbidities this is a nonoperative fracture.  Given patient's pain level, feel that she needs a rehabilitation stay.  Patient [facility] to [facility] as [facility] have a rehabilitation facility there and she is concerned about losing her place.  She does not wish to be admitted to the hospital if she can get away with it.  She is followed in [place] in the past.  Dr. Melvyn Novas is her pulmonologist.  [facility] at [facility], and have been able to place her at [facility] where there is a skilled nursing bed.  Patient is happy with this decision.  Patient has been noted to have some drop in oxygen saturations into 80's.  She has history of bronchiectasis.  Per her last pulmonology note, she is to be on 4 L all the time.  She reports that she's been doing 3 L at rest and 4 L with activity.  Given that she will requiring pain medicine and rehabilitation, we'll have her increase her oxygen to 4 L until she is over this acute injury.  Kalman Drape, MD 05/11/14 [phone]

## 2014-05-12 ENCOUNTER — Inpatient Hospital Stay (HOSPITAL_COMMUNITY): Payer: Medicare Other

## 2014-05-12 ENCOUNTER — Encounter: Payer: Medicare Other | Admitting: Adult Health

## 2014-05-12 DIAGNOSIS — I059 Rheumatic mitral valve disease, unspecified: Secondary | ICD-10-CM

## 2014-05-12 LAB — BASIC METABOLIC PANEL
Anion gap: 15 (ref 5–15)
BUN: 22 mg/dL (ref 6–23)
CO2: 29 mEq/L (ref 19–32)
CREATININE: 1.39 mg/dL — AB (ref 0.50–1.10)
Calcium: 8.5 mg/dL (ref 8.4–10.5)
Chloride: 93 mEq/L — ABNORMAL LOW (ref 96–112)
GFR calc Af Amer: 41 mL/min — ABNORMAL LOW (ref >90)
GFR, EST NON AFRICAN AMERICAN: 35 mL/min — AB (ref >90)
Glucose, Bld: 146 mg/dL — ABNORMAL HIGH (ref 70–99)
Potassium: 4.7 mEq/L (ref 3.7–5.3)
Sodium: 137 mEq/L (ref 137–147)

## 2014-05-12 LAB — CBC
HEMATOCRIT: 38.5 % (ref 36.0–46.0)
Hemoglobin: 11.6 g/dL — ABNORMAL LOW (ref 12.0–15.0)
MCH: 24.6 pg — AB (ref 26.0–34.0)
MCHC: 30.1 g/dL (ref 30.0–36.0)
MCV: 81.6 fL (ref 78.0–100.0)
Platelets: 271 10*3/uL (ref 150–400)
RBC: 4.72 MIL/uL (ref 3.87–5.11)
RDW: 15.8 % — AB (ref 11.5–15.5)
WBC: 29.4 10*3/uL — ABNORMAL HIGH (ref 4.0–10.5)

## 2014-05-12 LAB — URINE MICROSCOPIC-ADD ON

## 2014-05-12 LAB — URINALYSIS, ROUTINE W REFLEX MICROSCOPIC
BILIRUBIN URINE: NEGATIVE
GLUCOSE, UA: NEGATIVE mg/dL
Ketones, ur: NEGATIVE mg/dL
Nitrite: NEGATIVE
Protein, ur: NEGATIVE mg/dL
SPECIFIC GRAVITY, URINE: 1.041 — AB (ref 1.005–1.030)
UROBILINOGEN UA: 0.2 mg/dL (ref 0.0–1.0)
pH: 5.5 (ref 5.0–8.0)

## 2014-05-12 LAB — SODIUM, URINE, RANDOM

## 2014-05-12 LAB — TROPONIN I: Troponin I: 1.23 ng/mL (ref <0.30)

## 2014-05-12 LAB — TSH: TSH: 0.917 u[IU]/mL (ref 0.350–4.500)

## 2014-05-12 LAB — OSMOLALITY, URINE: Osmolality, Ur: 528 mOsm/kg (ref 390–1090)

## 2014-05-12 LAB — MRSA PCR SCREENING: MRSA by PCR: POSITIVE — AB

## 2014-05-12 MED ORDER — CHLORHEXIDINE GLUCONATE CLOTH 2 % EX PADS
6.0000 | MEDICATED_PAD | Freq: Every day | CUTANEOUS | Status: DC
Start: 2014-05-13 — End: 2014-05-16
  Administered 2014-05-13 – 2014-05-16 (×4): 6 via TOPICAL

## 2014-05-12 MED ORDER — HEPARIN SODIUM (PORCINE) 5000 UNIT/ML IJ SOLN
5000.0000 [IU] | Freq: Three times a day (TID) | INTRAMUSCULAR | Status: DC
  Administered 2014-05-12 – 2014-05-16 (×10): 5000 [IU] via SUBCUTANEOUS
  Filled 2014-05-12 (×18): qty 1

## 2014-05-12 MED ORDER — VANCOMYCIN HCL 500 MG IV SOLR
500.0000 mg | INTRAVENOUS | Status: DC
  Administered 2014-05-12: 500 mg via INTRAVENOUS
  Filled 2014-05-12 (×2): qty 500

## 2014-05-12 MED ORDER — HYDROCORTISONE NA SUCCINATE PF 250 MG IJ SOLR
200.0000 mg | Freq: Once | INTRAMUSCULAR | Status: AC
  Administered 2014-05-12: 200 mg via INTRAVENOUS
  Filled 2014-05-12 (×2): qty 200

## 2014-05-12 MED ORDER — MUPIROCIN 2 % EX OINT
TOPICAL_OINTMENT | CUTANEOUS | Status: AC
  Administered 2014-05-12: 1 via NASAL
  Filled 2014-05-12: qty 22

## 2014-05-12 MED ORDER — SODIUM CHLORIDE 0.9 % IV SOLN
250.0000 mL | INTRAVENOUS | Status: DC | PRN
  Administered 2014-05-12 (×2): 250 mL via INTRAVENOUS

## 2014-05-12 MED ORDER — CLOPIDOGREL BISULFATE 75 MG PO TABS
75.0000 mg | ORAL_TABLET | Freq: Every day | ORAL | Status: DC
  Administered 2014-05-12 – 2014-05-16 (×5): 75 mg via ORAL
  Filled 2014-05-12 (×5): qty 1

## 2014-05-12 MED ORDER — MUPIROCIN 2 % EX OINT
1.0000 | TOPICAL_OINTMENT | Freq: Two times a day (BID) | CUTANEOUS | Status: DC
Start: 2014-05-12 — End: 2014-05-16
  Administered 2014-05-12 – 2014-05-16 (×8): 1 via NASAL

## 2014-05-12 MED ORDER — TRAMADOL HCL 50 MG PO TABS
50.0000 mg | ORAL_TABLET | Freq: Two times a day (BID) | ORAL | Status: DC | PRN
  Administered 2014-05-12 – 2014-05-16 (×5): 50 mg via ORAL
  Filled 2014-05-12 (×6): qty 1

## 2014-05-12 MED ORDER — SODIUM CHLORIDE 0.9 % IV BOLUS (SEPSIS)
500.0000 mL | Freq: Once | INTRAVENOUS | Status: AC
  Administered 2014-05-12: 500 mL via INTRAVENOUS

## 2014-05-12 MED ORDER — HYDRALAZINE HCL 20 MG/ML IJ SOLN
10.0000 mg | Freq: Four times a day (QID) | INTRAMUSCULAR | Status: DC | PRN
  Administered 2014-05-12 – 2014-05-14 (×4): 10 mg via INTRAVENOUS
  Administered 2014-05-14: 20:00:00 via INTRAVENOUS
  Filled 2014-05-12 (×5): qty 1

## 2014-05-12 MED ORDER — BUDESONIDE 0.25 MG/2ML IN SUSP
0.5000 mg | Freq: Two times a day (BID) | RESPIRATORY_TRACT | Status: DC
  Administered 2014-05-12 – 2014-05-14 (×3): 0.5 mg via RESPIRATORY_TRACT
  Filled 2014-05-12 (×7): qty 4

## 2014-05-12 MED ORDER — BISOPROLOL FUMARATE 5 MG PO TABS
2.5000 mg | ORAL_TABLET | Freq: Every day | ORAL | Status: DC
  Administered 2014-05-12 – 2014-05-16 (×5): 2.5 mg via ORAL
  Filled 2014-05-12 (×5): qty 0.5

## 2014-05-12 MED ORDER — ALPRAZOLAM 0.25 MG PO TABS
0.2500 mg | ORAL_TABLET | Freq: Two times a day (BID) | ORAL | Status: DC | PRN
  Administered 2014-05-12 – 2014-05-14 (×3): 0.25 mg via ORAL
  Filled 2014-05-12 (×3): qty 1

## 2014-05-12 MED ORDER — DEXTROSE 5 % IV SOLN
1.0000 g | INTRAVENOUS | Status: DC
  Administered 2014-05-13: 1 g via INTRAVENOUS
  Filled 2014-05-12: qty 1

## 2014-05-12 MED ORDER — IOHEXOL 300 MG/ML  SOLN
100.0000 mL | Freq: Once | INTRAMUSCULAR | Status: AC | PRN
  Administered 2014-05-12: 100 mL via INTRAVENOUS

## 2014-05-12 NOTE — Progress Notes (Signed)
ANTIBIOTIC CONSULT NOTE - FOLLOW UP  Pharmacy Consult for Vanc/Fortaz Indication: pneumonia  Allergies  Allergen Reactions  . Chocolate Cough  . Codeine     Unknown reaction  . Erythromycin Ethylsuccinate     REACTION: unspecified  . Iodine   . Iohexol      Desc: pt states allergy many years ago--unable to remember type of allergy--pre med in future slg 06/13/08   . Sulfamethoxazole     REACTION: unspecified  . Septra [Bactrim] Itching and Rash    Patient Measurements: Height: 5' 1.02" (155 cm) Weight: 103 lb 6.3 oz (46.9 kg) IBW/kg (Calculated) : 47.86  Vital Signs: Temp: 98 F (36.7 C) (08/17 1244) Temp src: Oral (08/17 1244) BP: 137/116 mmHg (08/17 1100) Pulse Rate: 116 (08/17 1100) Intake/Output from previous day: 08/16 0701 - 08/17 0700 In: 770 [I.V.:320; IV Piggyback:450] Out: 850 [Urine:850] Intake/Output from this shift: Total I/O In: 565.5 [I.V.:65.5; IV Piggyback:500] Out: 60 [Urine:60]  Labs:  Recent Labs  05/11/14 1122 05/11/14 1127 05/11/14 1430 05/12/14 0207  WBC 29.5*  --  33.8* 29.4*  HGB 12.7 14.6 12.7 11.6*  PLT 372  --  351 271  CREATININE 0.92 0.90 1.07 1.39*   Estimated Creatinine Clearance: 24.7 ml/min (by C-G formula based on Cr of 1.39). No results found for this basename: VANCOTROUGH, Corlis Leak, VANCORANDOM, [place], GENTPEAK, GENTRANDOM, TOBRATROUGH, TOBRAPEAK, TOBRARND, AMIKACINPEAK, AMIKACINTROU, AMIKACIN,  in the last 72 hours   Microbiology: Recent Results (from the past 720 hour(s))  MRSA PCR SCREENING     Status: Abnormal   Collection Time    05/12/14 11:06 AM      Result Value Ref Range Status   MRSA by PCR POSITIVE (*) NEGATIVE Final   Comment:            The GeneXpert MRSA Assay (FDA     approved for NASAL specimens     only), is one component of a     comprehensive MRSA colonization     surveillance program. It is not     intended to diagnose MRSA     infection nor to guide or     monitor treatment for   MRSA infections.     RESULT CALLED TO, READ BACK BY AND VERIFIED WITH:     C. LUCK RN 13:10 05/12/14 ([email or web address])    Anti-infectives   Start     Dose/Rate Route Frequency Ordered Stop   05/13/14 0337  cefTAZidime (FORTAZ) 1 g in dextrose 5 % 50 mL IVPB     1 g 100 mL/hr over 30 Minutes Intravenous Every 24 hours 05/12/14 1028     05/12/14 1500  vancomycin (VANCOCIN) 500 mg in sodium chloride 0.9 % 100 mL IVPB     500 mg 100 mL/hr over 60 Minutes Intravenous Every 24 hours 05/12/14 1027     05/11/14 1500  vancomycin (VANCOCIN) IVPB 750 mg/150 ml premix  Status:  Discontinued     750 mg 150 mL/hr over 60 Minutes Intravenous Every 24 hours 05/11/14 1422 05/12/14 1027   05/11/14 1500  cefTAZidime (FORTAZ) 1 g in dextrose 5 % 50 mL IVPB  Status:  Discontinued     1 g 100 mL/hr over 30 Minutes Intravenous Every 12 hours 05/11/14 1422 05/12/14 1028   05/11/14 1300  levofloxacin (LEVAQUIN) IVPB 500 mg     500 mg 100 mL/hr over 60 Minutes Intravenous  Once 05/11/14 1204 05/11/14 1442      Assessment: [name] presented with respiratory distress.  Pharmacy consulted to dose Vanc/Fortaz for pneumonia. WBC 29.4, PCT 0.51, afebrile. SCr trending up, now 1.39 (from 0.9), CrCl ~25. In the setting of decrease in renal function, abx dosages will be adjusted.  Goal of Therapy:  Vancomycin trough level 15-20 mcg/ml  Plan:  - Decrease Vanc to 500mg  IV q24h - Decrease Fortaz to 1g IV q24h - Monitor renal fxn, clinical progress, and cultures - VT at ss  Thank you for allowing pharmacy to be part of this patient's care team  [place], Pharm.D Clinical Pharmacy Resident Pager: [phone] 05/12/2014 .1:21 PM

## 2014-05-12 NOTE — Progress Notes (Signed)
*  PRELIMINARY RESULTS* Echocardiogram 2D Echocardiogram has been performed.  [name] 05/12/2014, 3:52 PM

## 2014-05-12 NOTE — H&P (Signed)
PULMONARY / CRITICAL CARE MEDICINE   Name: [name] MRN: [id number] DOB: 18-Oct-1932    ADMISSION DATE:  05/11/2014  REFERRING MD :  EDP   CHIEF COMPLAINT: Resp distress   INITIAL PRESENTATION: 80yo WF [name] pt never smoker with severe chronic sinusitis/bronchectasis on home O2 and chronic steroids baseline 10mg  daily presented to ER 8/16 [facility] found to have right pubic ramus fx she was given pain meds and transitioned to rehab part at [place] in which she was found later in am with resp distress, lethargic and decreased mentation, brought back to ER , placed on BIPAP . PCCM asked to admit .    STUDIES:   SIGNIFICANT EVENTS:  SUBJECTIVE: some distress, on 100%  VITAL SIGNS: Temp:  [97.4 F (36.3 C)-97.8 F (36.6 C)] 97.5 F (36.4 C) (08/17 0743) Pulse Rate:  [91-161] 100 (08/17 0900) Resp:  [16-32] 20 (08/17 0900) BP: (120-168)/(45-107) 160/73 mmHg (08/17 0900) SpO2:  [76 %-100 %] 97 % (08/17 0900) FiO2 (%):  [60 %-100 %] 100 % (08/17 0900) Weight:  [46.9 kg (103 lb 6.3 oz)-48.2 kg (106 lb 4.2 oz)] 46.9 kg (103 lb 6.3 oz) (08/17 0345) HEMODYNAMICS:   VENTILATOR SETTINGS: Vent Mode:  [-] BIPAP;PCV FiO2 (%):  [60 %-100 %] 100 % Set Rate:  [15 bmp] 15 bmp PEEP:  [5 cmH20] 5 cmH20 INTAKE / OUTPUT:  Intake/Output Summary (Last 24 hours) at 05/12/14 1005 Last data filed at 05/12/14 0900  Gross per 24 hour  Intake    810 ml  Output    880 ml  Net    -70 ml    PHYSICAL EXAMINATION: General: awake mild distress Neuro: nonfocal , alert HEENT:jvd wnl PULM: fine coarse, reduced CV: s1 s2 RRR no r ABDO: bs wnl, no r/g, soft Extremities: no edema    LABS:  CBC  Recent Labs Lab 05/11/14 1122 05/11/14 1127 05/11/14 1430 05/12/14 0207  WBC 29.5*  --  33.8* 29.4*  HGB 12.7 14.6 12.7 11.6*  HCT 42.3 43.0 41.8 38.5  PLT 372  --  351 271   Coag's  Recent Labs Lab 05/11/14 1122  INR 0.96   BMET  Recent Labs Lab 05/11/14 1122  05/11/14 1127 05/11/14 1430 05/12/14 0207  NA 136* 135*  --  137  K 3.1* 3.0*  --  4.7  CL 92* 93*  --  93*  CO2 29  --   --  29  BUN 12 11  --  22  CREATININE 0.92 0.90 1.07 1.39*  GLUCOSE 182* 176*  --  146*   Electrolytes  Recent Labs Lab 05/11/14 1122 05/12/14 0207  CALCIUM 9.2 8.5   Sepsis Markers  Recent Labs Lab 05/11/14 1128 05/11/14 1430  LATICACIDVEN 1.50 2.5*  PROCALCITON  --  0.51   ABG  Recent Labs Lab 05/11/14 1126 05/11/14 1415  PHART 7.305* 7.364  PCO2ART 65.9* 56.1*  PO2ART 71.0* 71.0*   Liver Enzymes  Recent Labs Lab 05/11/14 1122  AST 40*  ALT 35  ALKPHOS 74  BILITOT 0.2*  ALBUMIN 3.5   Cardiac Enzymes  Recent Labs Lab 05/11/14 1122 05/11/14 1430 05/11/14 2020 05/12/14 0207  TROPONINI  --  0.67* 1.28* 1.23*  PROBNP 442.9 1377.0*  --   --    Glucose  Recent Labs Lab 05/11/14 1559  GLUCAP 191*    Imaging CXR chronic changes ? Increased patch opacity/atx in right    ASSESSMENT / PLAN:  PULMONARY  A:Acute on Chronic  Hypoxic Hypercarbic Resp Failure w/ underlying chronic steroid/oxygen dependent bronchiectasis ? Exacerbation  >acute worsening w/ resp distress post fall w/ pubic fx and presumed narcotic use . Concern for possible aspiration event w/ elevated wbc vs possible PE +/- hypoventilation with oversedation with narcotic   P:   Did not tolerate BIPAP, will discuss with R T to lower pressures for tolerability and maintain shcedule Low threshold to intubate if mentation worsens or further decline  Clinical suspicion is moderate (broken bones), ddimer concerning (but could also be from trauma), dopplers negative - likley need CT  Echo assess rv, pa pressures CT will also look for additional etiology Continue on BD  solumedrol 40mg  q12, maintain Broaden abx see ID section  CARDIOVASCULAR  A: HTN, r/ o PE, r/o ischemia (stress) P:  Echo for pa pressures, and rv fxn consider restart plavix Lovenox for DVT ,  transition to hep with crt rise Tr troponin till peak See pulm section Start home bisoprolol Add hydralazine prn IV  RENAL A:  ARF P:   Goal 15 cc/hr ( small muscle mass) May need cvp Chem in am  Bolus, UA, urine na, osm No lasix Pos balance goals  GASTROINTESTINAL A:  NPO  P:   PPI for sup  After ct consider clears  HEMATOLOGIC A:  Leukocytosis, r/o pe P:  Monitor cbc on hep now Dc lov (CRT)  INFECTIOUS A:  Possible Asp PNA w/ elevated WBC   P:   8/16 BCx2 >> Abx: Vanco , start date 8/16 , day 1/ Ceftaz 8/16 , day 1/  Look at further pct to limit abx CT may define infiltrates  ENDOCRINE A:  Hyperglycemia   P:  add SSI if BS >200  Goal NICE 140-180  NEUROLOGIC A:  Lethargic improved P:   Monitor closely , add tramadol Hold narcotics to avoid oversedation  Hold home zoloft and xanax  If tachy add home xanx   TODAY'S SUMMARY: for CT chest, may need line, ARF, change to heparin   Ccm time 30 min  Lavon Paganini. Titus Mould, MD, [place] Pgr: [facility]

## 2014-05-13 ENCOUNTER — Inpatient Hospital Stay (HOSPITAL_COMMUNITY): Payer: Medicare Other

## 2014-05-13 LAB — CBC WITH DIFFERENTIAL/PLATELET
Basophils Absolute: 0 10*3/uL (ref 0.0–0.1)
Basophils Relative: 0 % (ref 0–1)
Eosinophils Absolute: 0 10*3/uL (ref 0.0–0.7)
Eosinophils Relative: 0 % (ref 0–5)
HCT: 33.8 % — ABNORMAL LOW (ref 36.0–46.0)
HEMOGLOBIN: 10.3 g/dL — AB (ref 12.0–15.0)
LYMPHS ABS: 0.3 10*3/uL — AB (ref 0.7–4.0)
Lymphocytes Relative: 1 % — ABNORMAL LOW (ref 12–46)
MCH: 24.5 pg — AB (ref 26.0–34.0)
MCHC: 30.5 g/dL (ref 30.0–36.0)
MCV: 80.3 fL (ref 78.0–100.0)
Monocytes Absolute: 0.4 10*3/uL (ref 0.1–1.0)
Monocytes Relative: 2 % — ABNORMAL LOW (ref 3–12)
NEUTROS PCT: 97 % — AB (ref 43–77)
Neutro Abs: 17.3 10*3/uL — ABNORMAL HIGH (ref 1.7–7.7)
Platelets: 226 10*3/uL (ref 150–400)
RBC: 4.21 MIL/uL (ref 3.87–5.11)
RDW: 15.8 % — ABNORMAL HIGH (ref 11.5–15.5)
WBC: 17.9 10*3/uL — AB (ref 4.0–10.5)

## 2014-05-13 LAB — COMPREHENSIVE METABOLIC PANEL
ALT: 26 U/L (ref 0–35)
AST: 30 U/L (ref 0–37)
Albumin: 2.6 g/dL — ABNORMAL LOW (ref 3.5–5.2)
Alkaline Phosphatase: 52 U/L (ref 39–117)
Anion gap: 10 (ref 5–15)
BUN: 20 mg/dL (ref 6–23)
CO2: 28 meq/L (ref 19–32)
Calcium: 8.3 mg/dL — ABNORMAL LOW (ref 8.4–10.5)
Chloride: 100 mEq/L (ref 96–112)
Creatinine, Ser: 0.7 mg/dL (ref 0.50–1.10)
GFR calc non Af Amer: 81 mL/min — ABNORMAL LOW (ref >90)
Glucose, Bld: 139 mg/dL — ABNORMAL HIGH (ref 70–99)
Potassium: 4.2 mEq/L (ref 3.7–5.3)
SODIUM: 138 meq/L (ref 137–147)
TOTAL PROTEIN: 6.3 g/dL (ref 6.0–8.3)
Total Bilirubin: 0.2 mg/dL — ABNORMAL LOW (ref 0.3–1.2)

## 2014-05-13 MED ORDER — NISOLDIPINE ER 8.5 MG PO TB24
8.5000 mg | ORAL_TABLET | Freq: Every day | ORAL | Status: DC
  Administered 2014-05-13 – 2014-05-16 (×4): 8.5 mg via ORAL
  Filled 2014-05-13 (×4): qty 1

## 2014-05-13 MED ORDER — FAMOTIDINE 20 MG PO TABS
20.0000 mg | ORAL_TABLET | Freq: Every day | ORAL | Status: DC
  Administered 2014-05-13 – 2014-05-16 (×4): 20 mg via ORAL
  Filled 2014-05-13 (×4): qty 1

## 2014-05-13 MED ORDER — SERTRALINE HCL 25 MG PO TABS
25.0000 mg | ORAL_TABLET | Freq: Every day | ORAL | Status: DC
  Administered 2014-05-13 – 2014-05-16 (×4): 25 mg via ORAL
  Filled 2014-05-13 (×4): qty 1

## 2014-05-13 MED ORDER — SODIUM CHLORIDE 0.9 % IV SOLN: 250.0000 mL | INTRAVENOUS | Status: DC | PRN

## 2014-05-13 MED ORDER — CICLESONIDE 50 MCG/ACT NA SUSP
2.0000 | Freq: Two times a day (BID) | NASAL | Status: DC
  Administered 2014-05-14: 2 via NASAL

## 2014-05-13 MED ORDER — METHYLPREDNISOLONE SODIUM SUCC 40 MG IJ SOLR
40.0000 mg | Freq: Every day | INTRAMUSCULAR | Status: DC
  Administered 2014-05-14: 40 mg via INTRAVENOUS
  Filled 2014-05-13: qty 1

## 2014-05-13 MED ORDER — DEXTROSE 5 % IV SOLN
1.0000 g | INTRAVENOUS | Status: DC
  Administered 2014-05-14 – 2014-05-16 (×3): 1 g via INTRAVENOUS
  Filled 2014-05-13 (×4): qty 10

## 2014-05-13 NOTE — Progress Notes (Signed)
Continous pulse ox at bedside Rickard Rhymes, RN

## 2014-05-13 NOTE — Progress Notes (Signed)
Pt received into room 2w12, pt oriented to room and call bell, family at bedside, tele placed on pt, continuous pulse ox ordered from portable, vitals taken, will continue to monitor pt Rickard Rhymes, RN

## 2014-05-13 NOTE — Progress Notes (Signed)
PULMONARY / CRITICAL CARE MEDICINE   Name: [name] MRN: [id number] DOB: 02-19-1933    ADMISSION DATE:  05/11/2014  REFERRING MD :  EDP   CHIEF COMPLAINT: Resp distress   INITIAL PRESENTATION: 80yo W[place] [name] pt never smoker with severe chronic sinusitis/bronchectasis on home O2 and chronic steroids baseline 10mg  daily presented to ER 8/16 [facility] fall [facility] found to have right pubic ramus fx she was given pain meds and transitioned to rehab part at [place] in which she was found later in am with resp distress, lethargic and decreased mentation, brought back to ER , placed on BIPAP . PCCM asked to admit .    STUDIES:  CT chest - no prox pe, bronchiectasis, infiltrate?, MAC  SIGNIFICANT EVENTS:  SUBJECTIVE: no distress  VITAL SIGNS: Temp:  [97.2 F (36.2 C)-98.4 F (36.9 C)] 98.4 F (36.9 C) (08/18 0758) Pulse Rate:  [95-110] 96 (08/18 0800) Resp:  [15-26] 19 (08/18 0800) BP: (129-184)/(55-82) 179/81 mmHg (08/18 0800) SpO2:  [93 %-100 %] 97 % (08/18 0815) FiO2 (%):  [55 %] 55 % (08/18 0815) Weight:  [46.8 kg (103 lb 2.8 oz)] 46.8 kg (103 lb 2.8 oz) (08/18 0300) HEMODYNAMICS:   VENTILATOR SETTINGS: Vent Mode:  [-]  FiO2 (%):  [55 %] 55 % INTAKE / OUTPUT:  Intake/Output Summary (Last 24 hours) at 05/13/14 1108 Last data filed at 05/13/14 0800  Gross per 24 hour  Intake 1615.5 ml  Output    815 ml  Net  800.5 ml    PHYSICAL EXAMINATION: General: awake no distress Neuro: nonfocal , alert HEENT:jvd wnl PULM: fine coarse CV: s1 s2 RRR no r ABDO: bs wnl, no r/g, soft Extremities: no edema    LABS:  CBC  Recent Labs Lab 05/11/14 1430 05/12/14 0207 05/13/14 0249  WBC 33.8* 29.4* 17.9*  HGB 12.7 11.6* 10.3*  HCT 41.8 38.5 33.8*  PLT 351 271 226   Coag's  Recent Labs Lab 05/11/14 1122  INR 0.96   BMET  Recent Labs Lab 05/11/14 1122 05/11/14 1127 05/11/14 1430 05/12/14 0207 05/13/14 0249  NA 136* 135*  --  137 138  K 3.1* 3.0*  --  4.7  4.2  CL 92* 93*  --  93* 100  CO2 29  --   --  29 28  BUN 12 11  --  22 20  CREATININE 0.92 0.90 1.07 1.39* 0.70  GLUCOSE 182* 176*  --  146* 139*   Electrolytes  Recent Labs Lab 05/11/14 1122 05/12/14 0207 05/13/14 0249  CALCIUM 9.2 8.5 8.3*   Sepsis Markers  Recent Labs Lab 05/11/14 1128 05/11/14 1430  LATICACIDVEN 1.50 2.5*  PROCALCITON  --  0.51   ABG  Recent Labs Lab 05/11/14 1126 05/11/14 1415  PHART 7.305* 7.364  PCO2ART 65.9* 56.1*  PO2ART 71.0* 71.0*   Liver Enzymes  Recent Labs Lab 05/11/14 1122 05/13/14 0249  AST 40* 30  ALT 35 26  ALKPHOS 74 52  BILITOT 0.2* 0.2*  ALBUMIN 3.5 2.6*   Cardiac Enzymes  Recent Labs Lab 05/11/14 1122 05/11/14 1430 05/11/14 2020 05/12/14 0207  TROPONINI  --  0.67* 1.28* 1.23*  PROBNP 442.9 1377.0*  --   --    Glucose  Recent Labs Lab 05/11/14 1559  GLUCAP 191*    Imaging CXR chronic changes   ASSESSMENT / PLAN:  PULMONARY  A:Acute on Chronic Hypoxic Hypercarbic Resp Failure w/ underlying chronic steroid/oxygen dependent bronchiectasis ? 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  R/o PNA R/o edema  P:   Goal to 6 liters Dc hep drip done Echo assess rv, pa pressures - pending CT reviewed, will need further history on MAC treatment Continue on BD  solumedrol 40mg  q12 reduce to daily Neg balance in next 48 hr, see renal  CARDIOVASCULAR  A: HTN, r/ o PE, r/o ischemia (stress) P:  Echo for pa pressures, and rv fxn - may need cards consult plavix bisoprolol Add hydralazine prn IV  RENAL A:  ARF, improved with pos balance P:   Goal 15 cc/hr ( small muscle mass) Chem in am  Add lasix in future, hold as urine studies indicate pre renall improved  GASTROINTESTINAL A:  clears P:   PPI for sup  Advance diet  HEMATOLOGIC A:  Leukocytosis improved P:  Cbc in am  Sub q hep  INFECTIOUS A:   Asp PNA   P:   8/16 BCx2 >> Abx: Vanco , start date 8/16>>>8/18 Ceftaz 8/16>>>8/18 ceftraixeon today 8/18>>>plan 8 days total   ENDOCRINE A:  Hyperglycemia   P:  Goal NICE 140-180 on own prior  NEUROLOGIC A:  Lethargic improved P:   tramadol Hold narcotics to avoid oversedation  Add zoloft and xanax    TODAY'S SUMMARY: no pe, PNA likely , to med floor, no lasix    Lavon Paganini. Titus Mould, MD, [place] Pgr: [facility]

## 2014-05-13 NOTE — Progress Notes (Signed)
ANTIBIOTIC CONSULT NOTE - FOLLOW UP  Pharmacy Consult for rocephin Indication: pneumonia  Allergies  Allergen Reactions  . Chocolate Cough  . Codeine     Unknown reaction  . Erythromycin Ethylsuccinate     REACTION: unspecified  . Iodine   . Iohexol      Desc: pt states allergy many years ago--unable to remember type of allergy--pre med in future slg 06/13/08   . Sulfamethoxazole     REACTION: unspecified  . Septra [Bactrim] Itching and Rash    Patient Measurements: Height: 5' 1.02" (155 cm) Weight: 103 lb 2.8 oz (46.8 kg) IBW/kg (Calculated) : 47.86  Vital Signs: Temp: 98 F (36.7 C) (08/18 1233) Temp src: Oral (08/18 1233) BP: 140/47 mmHg (08/18 1300) Pulse Rate: 103 (08/18 1300) Intake/Output from previous day: 08/17 0701 - 08/18 0700 In: 2106 [I.V.:1456; IV Piggyback:650] Out: 258 [Urine:875] Intake/Output from this shift: Total I/O In: 350 [I.V.:350] Out: 350 [Urine:350]  Labs:  Recent Labs  05/11/14 1430 05/12/14 0207 05/13/14 0249  WBC 33.8* 29.4* 17.9*  HGB 12.7 11.6* 10.3*  PLT 351 271 226  CREATININE 1.07 1.39* 0.70   Estimated Creatinine Clearance: 42.8 ml/min (by C-G formula based on Cr of 0.7). No results found for this basename: VANCOTROUGH, VANCOPEAK, VANCORANDOM, GENTTROUGH, GENTPEAK, GENTRANDOM, TOBRATROUGH, TOBRAPEAK, TOBRARND, AMIKACINPEAK, AMIKACINTROU, AMIKACIN,  in the last 72 hours   Microbiology: Recent Results (from the past 720 hour(s))  CULTURE, BLOOD (ROUTINE X 2)     Status: None   Collection Time    05/11/14  2:15 PM      Result Value Ref Range Status   Specimen Description BLOOD LEFT ARM   Final   Special Requests BOTTLES DRAWN AEROBIC AND ANAEROBIC 10CC   Final   Culture  Setup Time     Final   Value: 05/12/2014 00:56     Performed at [facility]   Culture     Final   Value:        BLOOD CULTURE RECEIVED NO GROWTH TO DATE CULTURE WILL BE HELD FOR 5 DAYS BEFORE ISSUING A FINAL NEGATIVE REPORT     Performed at  [facility]   Report Status PENDING   Incomplete  CULTURE, BLOOD (ROUTINE X 2)     Status: None   Collection Time    05/11/14  2:30 PM      Result Value Ref Range Status   Specimen Description BLOOD LEFT HAND   Final   Special Requests BOTTLES DRAWN AEROBIC AND ANAEROBIC 10CC   Final   Culture  Setup Time     Final   Value: 05/12/2014 00:57     Performed at [facility]   Culture     Final   Value:        BLOOD CULTURE RECEIVED NO GROWTH TO DATE CULTURE WILL BE HELD FOR 5 DAYS BEFORE ISSUING A FINAL NEGATIVE REPORT     Performed at [facility]   Report Status PENDING   Incomplete  MRSA PCR SCREENING     Status: Abnormal   Collection Time    05/12/14 11:06 AM      Result Value Ref Range Status   MRSA by PCR POSITIVE (*) NEGATIVE Final   Comment:            The GeneXpert MRSA Assay (FDA     approved for NASAL specimens     only), is one component of a     comprehensive MRSA colonization  surveillance program. It is not     intended to diagnose MRSA     infection nor to guide or     monitor treatment for     MRSA infections.     RESULT CALLED TO, READ BACK BY AND VERIFIED WITH:     C. LUCK RN 13:10 05/12/14 ([email or web address])    Anti-infectives   Start     Dose/Rate Route Frequency Ordered Stop   05/14/14 0330  cefTRIAXone (ROCEPHIN) 1 g in dextrose 5 % 50 mL IVPB     1 g 100 mL/hr over 30 Minutes Intravenous Every 24 hours 05/13/14 1128 05/19/14 0329   05/13/14 0337  cefTAZidime (FORTAZ) 1 g in dextrose 5 % 50 mL IVPB  Status:  Discontinued     1 g 100 mL/hr over 30 Minutes Intravenous Every 24 hours 05/12/14 1028 05/13/14 1120   05/12/14 1500  vancomycin (VANCOCIN) 500 mg in sodium chloride 0.9 % 100 mL IVPB  Status:  Discontinued     500 mg 100 mL/hr over 60 Minutes Intravenous Every 24 hours 05/12/14 1027 05/13/14 1119   05/11/14 1500  vancomycin (VANCOCIN) IVPB 750 mg/150 ml premix  Status:  Discontinued     750 mg 150 mL/hr over 60 Minutes  Intravenous Every 24 hours 05/11/14 1422 05/12/14 1027   05/11/14 1500  cefTAZidime (FORTAZ) 1 g in dextrose 5 % 50 mL IVPB  Status:  Discontinued     1 g 100 mL/hr over 30 Minutes Intravenous Every 12 hours 05/11/14 1422 05/12/14 1028   05/11/14 1300  levofloxacin (LEVAQUIN) IVPB 500 mg     500 mg 100 mL/hr over 60 Minutes Intravenous  Once 05/11/14 1204 05/11/14 1442      Assessment: [name] presented with respiratory distress. Pharmacy consulted to initially dose Vanc/Fortaz for pneumonia now de-escalating to ceftriaxone to complete 8 days of antibiotic therapy . WBC 17.9 (trending down), PCT 0.51, afebrile. SCr back to baseline @ 0.7 , CrCl ~43.    Goal of Therapy:  Eradication of infection  Plan:  - Discontinue Vanc and Fortaz - Begin ceftriaxone 1g IV q24h (stop date 8/23) - Monitor clinical progress, and cultures - Pharmacy signing off, will monitor peripherally, please re-consult if additional dosing is required  Thank you for allowing pharmacy to be part of this patient's care team  [place], Pharm.D Clinical Pharmacy Resident Pager: [phone] 05/13/2014 .2:16 PM

## 2014-05-13 NOTE — Evaluation (Signed)
Occupational Therapy Evaluation Patient Details Name: [name] MRN: [id number] DOB: 1933/02/26 Today's Date: 05/13/2014    History of Present Illness This 80 y.o. female admitted with respiratory distress, lethargy and decreased menation after sustaining rt. pubic rami fracture earlier in the day 9tripped over 02 cord at home).   Respiratory distress suspected due to pain medication use.   PMH includes:  severe chronic sinusitis/bronchectasis on home 02 and chronic steroid use.  Uses 3-4L 02 at home.    Clinical Impression   Pt admitted with above. She demonstrates the below listed deficits and will benefit from continued OT to maximize safety and independence with BADLs.  Pt presents to OT with generalized weakness/deconditioning, and acute pain.  She tolerated first attempts OOB very well with 02 sats decreasing to 86% on 6L after standing x 2.   Sats returned to 90% with pursed lip breathing after ~3 mins.  Pt is very motivated, expect good progress.  Currently, she requires mod A for BADLs and min A +2 for standing.  Feel she will benefit from SNF>       Follow Up Recommendations  SNF    Equipment Recommendations  None recommended by OT    Recommendations for Other Services       Precautions / Restrictions Precautions Precautions: Fall;Other (comment) Precaution Comments: monitor 02 sats Restrictions Other Position/Activity Restrictions: No Weight bearing status found in chart      Mobility Bed Mobility Overal bed mobility: Needs Assistance Bed Mobility: Supine to Sit;Sit to Supine     Supine to sit: Mod assist;+2 for physical assistance Sit to supine: Min assist;+2 for physical assistance   General bed mobility comments: Pt requires assist to move LEs onto off of bed and to lift trunk and to control descent of trunk when returning to supine   Transfers Overall transfer level: Needs assistance   Transfers: Sit to/from Stand Sit to Stand: Min assist;+2  physical assistance         General transfer comment: HHA.  Pt moved into standing x 2 with assist to power up.   02 sats decreased to 86% on 6L with second stand     Balance Overall balance assessment: Needs assistance Sitting-balance support: Feet supported Sitting balance-Leahy Scale: Poor Sitting balance - Comments: min A - min guard assist   Standing balance support: Bilateral upper extremity supported Standing balance-Leahy Scale: Poor                              ADL Overall ADL's : Needs assistance/impaired Eating/Feeding: Independent;Bed level   Grooming: Wash/dry hands;Wash/dry face;Oral care;Brushing hair;Sitting;Minimal assistance   Upper Body Bathing: Minimal assitance;Sitting   Lower Body Bathing: Moderate assistance;Sit to/from stand   Upper Body Dressing : Minimal assistance;Sitting   Lower Body Dressing: Maximal assistance;Sit to/from stand Lower Body Dressing Details (indicate cue type and reason): Pt able to doff socks with min A EOB Toilet Transfer: Minimal assistance;+2 for physical assistance;BSC (sit to stand)   Toileting- Clothing Manipulation and Hygiene: Total assistance;Sit to/from stand       Functional mobility during ADLs: Minimal assistance;+2 for physical assistance General ADL Comments: Pt is very motivated.  Antcipate good progress     Vision                     Perception     Praxis      Pertinent Vitals/Pain Pain Assessment: Faces Faces Pain Scale:  Hurts little more Pain Location: pelvis with activity  Pain Descriptors / Indicators: Aching Pain Intervention(s): Monitored during session;Repositioned     Hand Dominance Right   Extremity/Trunk Assessment Upper Extremity Assessment Upper Extremity Assessment: Overall WFL for tasks assessed   Lower Extremity Assessment Lower Extremity Assessment: Defer to PT evaluation       Communication Communication Communication: No difficulties   Cognition  Arousal/Alertness: Awake/alert Behavior During Therapy: WFL for tasks assessed/performed Overall Cognitive Status: Within Functional Limits for tasks assessed                     General Comments       Exercises       Shoulder Instructions      Home Living Family/patient expects to be discharged to:: Skilled nursing facility                                 Additional Comments: Pt resides in Independent living at [facility]      Prior Functioning/Environment Level of Independence: Independent        Comments: Pt reports she drives, shops, prepares meals    OT Diagnosis: Generalized weakness;Acute pain   OT Problem List: Decreased strength;Decreased activity tolerance;Impaired balance (sitting and/or standing);Decreased safety awareness;Decreased knowledge of use of DME or AE;Pain;Cardiopulmonary status limiting activity   OT Treatment/Interventions: Self-care/ADL training;Therapeutic exercise;DME and/or AE instruction;Therapeutic activities;Patient/family education;Balance training    OT Goals(Current goals can be found in the care plan section) Acute Rehab OT Goals Patient Stated Goal: To get stronger and regain independence OT Goal Formulation: With patient Time For Goal Achievement: 05/27/14 Potential to Achieve Goals: Good ADL Goals Pt Will Perform Grooming: with min guard assist;standing Pt Will Perform Upper Body Bathing: with set-up;with supervision;sitting Pt Will Perform Lower Body Bathing: with min guard assist;sit to/from stand Pt Will Perform Lower Body Dressing: with min guard assist;sit to/from stand Pt Will Transfer to Toilet: with min guard assist;ambulating;regular height toilet;grab bars Pt Will Perform Toileting - Clothing Manipulation and hygiene: with min guard assist;sit to/from stand Pt/caregiver will Perform Home Exercise Program: Increased strength;Both right and left upper extremity;With  theraband;Independently;With written HEP provided  OT Frequency: Min 2X/week   Barriers to D/C: Decreased caregiver support          Co-evaluation PT/OT/SLP Co-Evaluation/Treatment: Yes Reason for Co-Treatment: For patient/therapist safety   OT goals addressed during session: ADL's and self-care      End of Session Equipment Utilized During Treatment: Oxygen Nurse Communication: Mobility status  Activity Tolerance: Patient tolerated treatment well Patient left: in bed;with call bell/phone within reach   Time: 4098-1191 OT Time Calculation (min): 19 min Charges:  OT General Charges $OT Visit: 1 Procedure OT Evaluation $Initial OT Evaluation Tier I: 1 Procedure G-Codes:    Lucille Passy M 2014-06-01, 3:17 PM

## 2014-05-13 NOTE — Evaluation (Signed)
Physical Therapy Evaluation Patient Details Name: [name] MRN: [id number] DOB: 02-12-1933 Today's Date: 05/13/2014   History of Present Illness  This 80 y.o. female admitted with respiratory distress, lethargy and decreased menation after sustaining rt. pubic rami fracture earlier in the day 9tripped over 02 cord at home).   Respiratory distress suspected due to pain medication use.   PMH includes:  severe chronic sinusitis/bronchectasis on home 02 and chronic steroid use.  Uses 3-4L 02 at home.   Clinical Impression  Pt admitted with above. Pt currently with functional limitations due to the deficits listed below (see PT Problem List).  Pt will benefit from skilled PT to increase their independence and safety with mobility to allow discharge to the venue listed below. Feel pt will make good progress and will eventually be able to return to independent apt after ST-SNF.      Follow Up Recommendations SNF    Equipment Recommendations  None recommended by PT    Recommendations for Other Services       Precautions / Restrictions Precautions Precautions: Fall;Other (comment) Precaution Comments: monitor 02 sats Restrictions Other Position/Activity Restrictions: No Weight bearing status found in chart      Mobility  Bed Mobility Overal bed mobility: Needs Assistance Bed Mobility: Supine to Sit;Sit to Supine     Supine to sit: Mod assist;+2 for physical assistance Sit to supine: Min assist;+2 for physical assistance   General bed mobility comments: Pt requires assist to move LEs onto off of bed and to lift trunk and to control descent of trunk when returning to supine   Transfers Overall transfer level: Needs assistance Equipment used: 2 person hand held assist Transfers: Sit to/from Stand Sit to Stand: Min assist;+2 physical assistance         General transfer comment: HHA.  Pt moved into standing x 2 with assist to power up.   02 sats decreased to 86% on 6L with  second stand   Ambulation/Gait                Stairs            Wheelchair Mobility    Modified Rankin (Stroke Patients Only)       Balance Overall balance assessment: Needs assistance Sitting-balance support: Feet supported Sitting balance-Leahy Scale: Poor Sitting balance - Comments: min A - min guard assist   Standing balance support: Bilateral upper extremity supported Standing balance-Leahy Scale: Poor Standing balance comment: Stood x 30 sec with bilateral forearm support. Stood 2nd time for 10 sec for change of bed pad.                             Pertinent Vitals/Pain Pain Assessment: Faces Faces Pain Scale: Hurts little more Pain Location: pelvis with activity  Pain Descriptors / Indicators: Aching Pain Intervention(s): Monitored during session;Repositioned    Home Living Family/patient expects to be discharged to:: Skilled nursing facility                 Additional Comments: Pt resides in Independent living at [facility]    Prior Function Level of Independence: Independent         Comments: Pt reports she drives, shops, prepares meals     Hand Dominance   Dominant Hand: Right    Extremity/Trunk Assessment   Upper Extremity Assessment: Overall WFL for tasks assessed           Lower Extremity  Assessment: Generalized weakness;RLE deficits/detail RLE Deficits / Details: Limited due to pelvic pain.       Communication   Communication: No difficulties  Cognition Arousal/Alertness: Awake/alert Behavior During Therapy: WFL for tasks assessed/performed Overall Cognitive Status: Within Functional Limits for tasks assessed                      General Comments      Exercises        Assessment/Plan    PT Assessment Patient needs continued PT services  PT Diagnosis Difficulty walking;Generalized weakness;Acute pain   PT Problem List Decreased strength;Decreased activity  tolerance;Decreased balance;Decreased mobility;Decreased knowledge of use of DME;Pain  PT Treatment Interventions DME instruction;Gait training;Functional mobility training;Therapeutic activities;Therapeutic exercise;Patient/family education;Balance training   PT Goals (Current goals can be found in the Care Plan section) Acute Rehab PT Goals Patient Stated Goal: To get stronger and regain independence PT Goal Formulation: With patient Time For Goal Achievement: 05/20/14 Potential to Achieve Goals: Good    Frequency Min 3X/week   Barriers to discharge        Co-evaluation PT/OT/SLP Co-Evaluation/Treatment: Yes Reason for Co-Treatment: For patient/therapist safety PT goals addressed during session: Mobility/safety with mobility;Balance OT goals addressed during session: ADL's and self-care       End of Session Equipment Utilized During Treatment: Gait belt Activity Tolerance: Patient limited by fatigue Patient left: in bed;with call bell/phone within reach Nurse Communication: Mobility status         Time: 0315-9458 PT Time Calculation (min): 19 min   Charges:   PT Evaluation $Initial PT Evaluation Tier I: 1 Procedure     PT G Codes:          Toneisha Savary 2014/05/23, 3:30 PM  [facility] PT [phone]

## 2014-05-13 NOTE — Progress Notes (Signed)
[place] Progress Note Patient Name: SANTA ABDELRAHMAN DOB: Jan 14, 1933 MRN: [id number]   Date of Service  05/13/2014  HPI/Events of Note  Transient desaturation to mid 80s, resolves with slow, deep breaths  eICU Interventions  Incentive spirometry ordered to the bedside.      Intervention Category Intermediate Interventions: Other:  Finlee Concepcion S. 05/13/2014, 4:30 PM

## 2014-05-14 ENCOUNTER — Inpatient Hospital Stay (HOSPITAL_COMMUNITY): Payer: Medicare Other

## 2014-05-14 DIAGNOSIS — J310 Chronic rhinitis: Secondary | ICD-10-CM

## 2014-05-14 LAB — BASIC METABOLIC PANEL
Anion gap: 10 (ref 5–15)
BUN: 18 mg/dL (ref 6–23)
CHLORIDE: 98 meq/L (ref 96–112)
CO2: 29 mEq/L (ref 19–32)
Calcium: 8.5 mg/dL (ref 8.4–10.5)
Creatinine, Ser: 0.63 mg/dL (ref 0.50–1.10)
GFR calc non Af Amer: 84 mL/min — ABNORMAL LOW (ref >90)
Glucose, Bld: 94 mg/dL (ref 70–99)
POTASSIUM: 4.5 meq/L (ref 3.7–5.3)
Sodium: 137 mEq/L (ref 137–147)

## 2014-05-14 LAB — CBC WITH DIFFERENTIAL/PLATELET
Basophils Absolute: 0 10*3/uL (ref 0.0–0.1)
Basophils Relative: 0 % (ref 0–1)
Eosinophils Absolute: 0 10*3/uL (ref 0.0–0.7)
Eosinophils Relative: 0 % (ref 0–5)
HCT: 34.1 % — ABNORMAL LOW (ref 36.0–46.0)
HEMOGLOBIN: 10.3 g/dL — AB (ref 12.0–15.0)
LYMPHS PCT: 6 % — AB (ref 12–46)
Lymphs Abs: 1.2 10*3/uL (ref 0.7–4.0)
MCH: 24.3 pg — ABNORMAL LOW (ref 26.0–34.0)
MCHC: 30.2 g/dL (ref 30.0–36.0)
MCV: 80.4 fL (ref 78.0–100.0)
MONOS PCT: 9 % (ref 3–12)
Monocytes Absolute: 1.8 10*3/uL — ABNORMAL HIGH (ref 0.1–1.0)
Neutro Abs: 16.2 10*3/uL — ABNORMAL HIGH (ref 1.7–7.7)
Neutrophils Relative %: 85 % — ABNORMAL HIGH (ref 43–77)
Platelets: 261 10*3/uL (ref 150–400)
RBC: 4.24 MIL/uL (ref 3.87–5.11)
RDW: 16.1 % — ABNORMAL HIGH (ref 11.5–15.5)
WBC: 19.1 10*3/uL — AB (ref 4.0–10.5)

## 2014-05-14 MED ORDER — ARFORMOTEROL TARTRATE 15 MCG/2ML IN NEBU
15.0000 ug | INHALATION_SOLUTION | Freq: Two times a day (BID) | RESPIRATORY_TRACT | Status: DC
  Administered 2014-05-14 – 2014-05-15 (×3): 15 ug via RESPIRATORY_TRACT
  Filled 2014-05-14 (×7): qty 2

## 2014-05-14 MED ORDER — IPRATROPIUM-ALBUTEROL 0.5-2.5 (3) MG/3ML IN SOLN: 3.0000 mL | RESPIRATORY_TRACT | Status: DC | PRN

## 2014-05-14 MED ORDER — FLUTICASONE PROPIONATE 50 MCG/ACT NA SUSP
1.0000 | Freq: Two times a day (BID) | NASAL | Status: DC
  Administered 2014-05-14 – 2014-05-16 (×4): 1 via NASAL
  Filled 2014-05-14: qty 16

## 2014-05-14 MED ORDER — BUDESONIDE 0.25 MG/2ML IN SUSP
0.5000 mg | Freq: Two times a day (BID) | RESPIRATORY_TRACT | Status: DC
  Filled 2014-05-14: qty 4

## 2014-05-14 MED ORDER — BUDESONIDE 0.5 MG/2ML IN SUSP
0.5000 mg | Freq: Two times a day (BID) | RESPIRATORY_TRACT | Status: DC
Start: 2014-05-14 — End: 2014-05-16
  Administered 2014-05-14 – 2014-05-15 (×3): 0.5 mg via RESPIRATORY_TRACT
  Filled 2014-05-14 (×6): qty 2

## 2014-05-14 MED ORDER — PREDNISONE 20 MG PO TABS
20.0000 mg | ORAL_TABLET | Freq: Every day | ORAL | Status: DC
  Administered 2014-05-15 – 2014-05-16 (×2): 20 mg via ORAL
  Filled 2014-05-14 (×4): qty 1

## 2014-05-14 MED ORDER — LEVOTHYROXINE SODIUM 100 MCG PO TABS
100.0000 ug | ORAL_TABLET | Freq: Every day | ORAL | Status: DC
  Administered 2014-05-15 – 2014-05-16 (×2): 100 ug via ORAL
  Filled 2014-05-14 (×3): qty 1

## 2014-05-14 NOTE — Progress Notes (Signed)
Physical Therapy Treatment Patient Details Name: [name] MRN: [id number] DOB: 1932/12/08 Today's Date: 05/14/2014    History of Present Illness This 80 y.o. female admitted with respiratory distress, lethargy and decreased menation after sustaining rt. pubic rami fracture earlier in the day 9tripped over 02 cord at home).   Respiratory distress suspected due to pain medication use.   PMH includes:  severe chronic sinusitis/bronchectasis on home 02 and chronic steroid use.  Uses 3-4L 02 at home.     PT Comments    Pt making slow progress. Respiratory status is most limiting factor.  Follow Up Recommendations  SNF     Equipment Recommendations  None recommended by PT    Recommendations for Other Services       Precautions / Restrictions Precautions Precautions: Fall Precaution Comments: monitor 02 sats Restrictions Other Position/Activity Restrictions: No Weight bearing status found in chart    Mobility  Bed Mobility               General bed mobility comments: Pt on BSC at beginning of session  Transfers Overall transfer level: Needs assistance Equipment used: Rolling walker (2 wheeled) Transfers: Sit to/from [facility] Sit to Stand: Min assist Stand pivot transfers: Min assist;+2 safety/equipment       General transfer comment: Verbal cues for hand placement. Assist to bring hips up and to assist with moving walker.  Ambulation/Gait Ambulation/Gait assistance: Min assist;+2 safety/equipment Ambulation Distance (Feet): 3 Feet Assistive device: Rolling walker (2 wheeled) Gait Pattern/deviations: Step-through pattern;Decreased step length - right;Decreased step length - left;Decreased stance time - right;Trunk flexed Gait velocity: decr Gait velocity interpretation: Below normal speed for age/gender General Gait Details: Verbal cues for gait sequence and to stand more erect. SaO2 82-85% on 6L O2.   Stairs            Wheelchair  Mobility    Modified Rankin (Stroke Patients Only)       Balance   Sitting-balance support: No upper extremity supported;Feet supported Sitting balance-Leahy Scale: Fair     Standing balance support: Bilateral upper extremity supported Standing balance-Leahy Scale: Poor Standing balance comment: Support of walker and min A.                    Cognition Arousal/Alertness: Awake/alert Behavior During Therapy: WFL for tasks assessed/performed Overall Cognitive Status: Within Functional Limits for tasks assessed                      Exercises      General Comments        Pertinent Vitals/Pain Pain Assessment: Faces Faces Pain Scale: Hurts little more Pain Location: pelvis with activity Pain Descriptors / Indicators: Aching Pain Intervention(s): Limited activity within patient's tolerance    Home Living                      Prior Function            PT Goals (current goals can now be found in the care plan section) Progress towards PT goals: Progressing toward goals    Frequency  Min 3X/week    PT Plan Current plan remains appropriate    Co-evaluation             End of Session Equipment Utilized During Treatment: Gait belt Activity Tolerance: Patient limited by fatigue Patient left: in chair;with call bell/phone within reach;with family/visitor present     Time: 0539-7673 PT Time Calculation (  min): 26 min  Charges:  $Gait Training: 23-37 mins                    G Codes:      Antonina Deziel 2014-05-20, 2:06 PM  [employer] PT [phone]

## 2014-05-14 NOTE — Progress Notes (Signed)
PULMONARY / CRITICAL CARE MEDICINE   Name: [name] MRN: [id number] DOB: 1933/05/23    ADMISSION DATE:  05/11/2014  REFERRING MD :  EDP   CHIEF COMPLAINT: Resp distress   INITIAL PRESENTATION:  80 yo female presented to ER after fall with resulting Rt pubic ramus fracture.  Developed respiratory distress, altered mental status after receiving pain medications and required BiPAP in ER.  PCCM asked to admit.  She is followed by Dr. Melvyn Novas for chronic sinusitis and bronchiectasis on home oxygen and 10 mg prednisone daily.  STUDIES:  8/16 Dopper legs >> no DVT 8/17 Echo >> mod/severe LVH, EF 55 to 40%, grade 1 diastolic dysfx 7/68 CT chest >> borderline LAN with calcifications, patchy GGO with BTX and peribronchovascular nodularity, consolidation LLL  SIGNIFICANT EVENTS: 8/16 Admit, BiPAP 8/18 Transfer to Flr  SUBJECTIVE: Feeling a bit better.  Remains slightly more SOB than usual. Denies chest pain, purulent sputum .   VITAL SIGNS: Temp:  [98 F (36.7 C)-98.9 F (37.2 C)] 98.9 F (37.2 C) (08/19 0654) Pulse Rate:  [93-115] 114 (08/19 0804) Resp:  [17-24] 19 (08/19 0654) BP: (134-177)/(47-92) 155/59 mmHg (08/19 0804) SpO2:  [86 %-100 %] 86 % (08/19 0804) Weight:  [110 lb 3.7 oz (50 kg)] 110 lb 3.7 oz (50 kg) (08/19 0654) 5L [place] INTAKE / OUTPUT:  Intake/Output Summary (Last 24 hours) at 05/14/14 1036 Last data filed at 05/14/14 0612  Gross per 24 hour  Intake    635 ml  Output    800 ml  Net   -165 ml    PHYSICAL EXAMINATION: General: awake no distress Neuro: nonfocal , alert, appropriate, weak  HEENT: mm dry, no JVD PULM: resps even, mildly labored on [place], exp wheeze, coarse CV: s1 s2 RRR no r ABDO: bs wnl, no r/g, soft Extremities: no edema    LABS:  CBC  Recent Labs Lab 05/12/14 0207 05/13/14 0249 05/14/14 0352  WBC 29.4* 17.9* 19.1*  HGB 11.6* 10.3* 10.3*  HCT 38.5 33.8* 34.1*  PLT 271 226 261   Coag's  Recent Labs Lab 05/11/14 1122  INR  0.96   BMET  Recent Labs Lab 05/12/14 0207 05/13/14 0249 05/14/14 0352  NA 137 138 137  K 4.7 4.2 4.5  CL 93* 100 98  CO2 29 28 29   BUN 22 20 18   CREATININE 1.39* 0.70 0.63  GLUCOSE 146* 139* 94   Electrolytes  Recent Labs Lab 05/12/14 0207 05/13/14 0249 05/14/14 0352  CALCIUM 8.5 8.3* 8.5   Sepsis Markers  Recent Labs Lab 05/11/14 1128 05/11/14 1430  LATICACIDVEN 1.50 2.5*  PROCALCITON  --  0.51   ABG  Recent Labs Lab 05/11/14 1126 05/11/14 1415  PHART 7.305* 7.364  PCO2ART 65.9* 56.1*  PO2ART 71.0* 71.0*   Liver Enzymes  Recent Labs Lab 05/11/14 1122 05/13/14 0249  AST 40* 30  ALT 35 26  ALKPHOS 74 52  BILITOT 0.2* 0.2*  ALBUMIN 3.5 2.6*   Cardiac Enzymes  Recent Labs Lab 05/11/14 1122 05/11/14 1430 05/11/14 2020 05/12/14 0207  TROPONINI  --  0.67* 1.28* 1.23*  PROBNP 442.9 1377.0*  --   --    Glucose  Recent Labs Lab 05/11/14 1559  GLUCAP 191*    Imaging Dg Chest Port 1 View  05/13/2014   CLINICAL DATA:  Infiltrate  EXAM: PORTABLE CHEST - 1 VIEW  COMPARISON:  Chest radiograph and chest CT May 12, 2014  FINDINGS: There is persistent airspace consolidation left lower lobe. There  is widespread nodular interstitial thickening with diffuse bronchiectasis, stable. No new opacity is seen. Heart size is within normal limits. Pulmonary vascular is normal. Note that there is underlying emphysema. There is evidence of a prior fracture of the proximal right humeral metaphysis, stable.  IMPRESSION: No change from 1 day prior. Underlying emphysema with left base airspace consolidation as well as widespread bronchiectatic change and nodular interstitial thickening. These are findings that may be seen with chronic atypical pneumonitis with Mycobacterium avium complex a significant differential consideration.   Electronically Signed   By: Lowella Grip M.D.   On: 05/13/2014 07:39     Impression:  80 yo female with acute on chronic  hypoxic/hypercapnic respiratory failure 2nd to pain medications, PNA, pulmonary edema in setting of severe BTX with chronic sinusitis.  Acute on chronic respiratory failure >> did not tolerate BiPAP. Plan: Oxygen to keep SpO2 > 90%  PNA. Plan: Day 4 Abx, currently on rocephin Blood cx 8/16 >>   Bronchiectasis, sinusitis. Plan: Continue pulmicort, omnaris Add brovana 8/19 Change duoneb to prn Change to prednisone 8/19 and wean to baseline dose of 10 mg/day as tolerated  Hx of HTN, TIA. Plan:  Continue plavix, bisoprolol, nisoldipine hydralazine prn IV  Hx of hypothyroidism. Plan: Continue synthroid  Acute encephalopathy 2nd to medications and respiratory failure. Hx of depression. P:   PRN tramadol Hold narcotics to avoid oversedation  Continue zoloft and PRN xanax   Rt pubic ramus fx after fall. Plan: Physical therapy consulted >> recommending SNF >> will consult social worker   Nickolas Madrid, NP 05/14/2014  10:36 AM Pager: [phone] or [phone]  Updated family at bedside. D/c telemetry.    Chesley Mires, MD [facility] Pulmonary/Critical Care 05/14/2014, 12:21 PM Pager:  [phone] After 3pm call: [phone]

## 2014-05-14 NOTE — Clinical Social Work Note (Signed)
Clinical Social Work Department BRIEF PSYCHOSOCIAL ASSESSMENT 05/14/2014  Patient:  [name], [name]     Account Number:  [id number]     Admit date:  05/11/2014  Clinical Social Worker:  Myles Lipps  Date/Time:  05/14/2014 03:45 PM  Referred by:  Care Management  Date Referred:  05/14/2014 Referred for  SNF Placement   Other Referral:   Interview type:  Patient Other interview type:   Spoke with patient sister over the phone per patient request    PSYCHOSOCIAL DATA Living Status:  FACILITY Admitted from facility:  [facility] Level of care:  Independent Living Primary support name:  Siegfried,Judy  [phone] Primary support relationship to patient:  SIBLING Degree of support available:   Strong    CURRENT CONCERNS Current Concerns  Post-Acute Placement   Other Concerns:    SOCIAL WORK ASSESSMENT / PLAN Clinical Social Worker met with patient at bedside to offer support and discuss patient needs at discharge.  Patient states that she currently lives at [facility] and would like to return to the SNF level of care there.  CSW left a message with admissions coordinator to determine bed availability.  CSW spoke with admissions coordinator at [facility] who confirms that they do have bed availiability if [facility] does not have an available bed - patient agreeable.  CSW spoke with patient sister over the phone to provide an update regarding patient discharge disposition per patient request.    Clinical Social Worker to continue to follow up with both facilities regarding bed availability.  CSW remains available for support and to facilitate patient discharge needs once medically stable.   Assessment/plan status:  Psychosocial Support/Ongoing Assessment of Needs Other assessment/ plan:   Information/referral to community resources:   Holiday representative offered alternative facility options, however due to  patient stability with [facility] facilities arrangements will be made.    PATIENT'S/FAMILY'S RESPONSE TO PLAN OF CARE: Patient alert and oriented to self and place.  Patient with good family support who plan to continue to assist patient following discharge.  Patient and family agreeable with discharge plans to either [facility] facility based on bed availaibilty.  Patient and family verbalized their appreciation for CSW support and involvement.

## 2014-05-14 NOTE — Care Management Note (Addendum)
    Page 1 of 1   05/16/2014     2:27:22 PM CARE MANAGEMENT NOTE 05/16/2014  Patient:  [name], [name]   Account Number:  [id number]  Date Initiated:  05/14/2014  Documentation initiated by:  Mario Coronado  Subjective/Objective Assessment:   Pt adm on 05/11/14 with acute respiratory failure s/p recent pubic ramus fx.  PTA, pt resides at [facility], [place], Independent Living.     Action/Plan:   PT/OT recommending SNF level at dc.  CSW consulted to facilitate dc to SNF when medically stable for dc.  Will follow progress.   Anticipated DC Date:  05/16/2014   Anticipated DC Plan:  SKILLED NURSING FACILITY  In-house referral  Clinical Social Worker      DC Planning Services  CM consult      Choice offered to / List presented to:             Status of service:  Completed, signed off Medicare Important Message given?  YES (If response is "NO", the following Medicare IM given date fields will be blank) Date Medicare IM given:  05/14/2014 Medicare IM given by:  Valdis Bevill Date Additional Medicare IM given:   Additional Medicare IM given by:    Discharge Disposition:  [place]  Per UR Regulation:  Reviewed for med. necessity/level of care/duration of stay  If discussed at [place] of Stay Meetings, dates discussed:    Comments:  05/16/14 Ellan Lambert, RN, BSN [phone] Pt discharging to SNF today, per CSW arrangements.

## 2014-05-14 NOTE — Progress Notes (Signed)
Flutter valve given to pt. Pt has a week NPC. PT will need encouragement to do flutter.

## 2014-05-15 ENCOUNTER — Inpatient Hospital Stay (HOSPITAL_COMMUNITY): Payer: Medicare Other

## 2014-05-15 DIAGNOSIS — W19XXXS Unspecified fall, sequela: Secondary | ICD-10-CM

## 2014-05-15 DIAGNOSIS — T7589XS Other specified effects of external causes, sequela: Secondary | ICD-10-CM

## 2014-05-15 DIAGNOSIS — J189 Pneumonia, unspecified organism: Secondary | ICD-10-CM

## 2014-05-15 LAB — CBC
HCT: 37.1 % (ref 36.0–46.0)
HEMOGLOBIN: 11.4 g/dL — AB (ref 12.0–15.0)
MCH: 24 pg — AB (ref 26.0–34.0)
MCHC: 30.7 g/dL (ref 30.0–36.0)
MCV: 78.1 fL (ref 78.0–100.0)
Platelets: 248 10*3/uL (ref 150–400)
RBC: 4.75 MIL/uL (ref 3.87–5.11)
RDW: 16 % — ABNORMAL HIGH (ref 11.5–15.5)
WBC: 14.3 10*3/uL — ABNORMAL HIGH (ref 4.0–10.5)

## 2014-05-15 LAB — BASIC METABOLIC PANEL
Anion gap: 10 (ref 5–15)
BUN: 16 mg/dL (ref 6–23)
CO2: 30 mEq/L (ref 19–32)
Calcium: 9.1 mg/dL (ref 8.4–10.5)
Chloride: 93 mEq/L — ABNORMAL LOW (ref 96–112)
Creatinine, Ser: 0.55 mg/dL (ref 0.50–1.10)
GFR calc Af Amer: >90 mL/min (ref >90)
GFR calc non Af Amer: 88 mL/min — ABNORMAL LOW (ref >90)
GLUCOSE: 92 mg/dL (ref 70–99)
POTASSIUM: 4.5 meq/L (ref 3.7–5.3)
Sodium: 133 mEq/L — ABNORMAL LOW (ref 137–147)

## 2014-05-15 MED ORDER — LEVOFLOXACIN 750 MG PO TABS: ORAL_TABLET | ORAL | Status: DC

## 2014-05-15 MED ORDER — BUDESONIDE 0.5 MG/2ML IN SUSP
0.5000 mg | Freq: Two times a day (BID) | RESPIRATORY_TRACT | Status: DC
Start: 2014-05-15 — End: 2014-05-27

## 2014-05-15 MED ORDER — PREDNISONE 20 MG PO TABS: ORAL_TABLET | ORAL | Status: DC

## 2014-05-15 MED ORDER — TRAMADOL HCL 50 MG PO TABS: 50.0000 mg | ORAL_TABLET | Freq: Two times a day (BID) | ORAL | Status: DC | PRN

## 2014-05-15 MED ORDER — ARFORMOTEROL TARTRATE 15 MCG/2ML IN NEBU: 15.0000 ug | INHALATION_SOLUTION | Freq: Two times a day (BID) | RESPIRATORY_TRACT | Status: DC

## 2014-05-15 NOTE — Discharge Summary (Addendum)
Physician Discharge Summary  Patient ID: ELIAS BORDNER MRN: [id number] DOB/AGE: Jun 13, 1933 80 y.o.  Admit date: 05/11/2014 Discharge date: 05/16/2014    Discharge Diagnoses:  Active Problems:   BRONCHIECTASIS W/ACUTE EXACERBATION   Acute respiratory failure   Chronic respiratory failure, unspecified whether with hypoxia or hypercapnia   HCAP (healthcare-associated pneumonia)                                                                    D/c plan for outpt F/u    Acute on chronic respiratory failure  HCAP  Bronchiectasis, sinusitis  D/c plan --  Cont pulmicort, brovana, omnaris  PRN albuterol  Levaquin for total 7 days abx  Prednisone with taper back to 60m/day  Continuous O2  Close outpt pulm f/u   Hx HTN Hx TIA  D/c plan --  Cont plvix, bisoprolol, sular  PCP f/u ([name])   Hypothyroid  D/c plan --  Cont synthroid  F/u TSH    Acute encephalopathy 2nd to medications and respiratory failure.  Hx of depression.  D/c plan --  PRN tramadol  Hold narcotics to avoid oversedation  Continue zoloft and PRN xanax   Rt pubic ramus fx after fall (non-operative) D/c plan --  PT  Ultram for pain   Brief Summary: WPHUNG KOTASis a 80y.o. y/o female with a PMH of severe bronchiectasis and chronic sinusitis on chronic prednisone and home O2 initially presented 8/16 after a fall at home and was found to have a R pubic ramus fx.  She was given pain rx and tx back to [facility] where she was later found with resp distress and lethargy.  Sent back to ER and placed on bipap and PCCM called to admit.  Respiratory failure thought secondary to narcotics in setting underlying chronic respiratory failure, pulmonary edema and new HCAP. She improved with narcan, bipap, nebs, IV abx and solumedrol and required no further bipap. She received no further narcotics and pain has been well controlled on PRN ultram.  Echo revealed grade 1 diastolic dysfunction as below.  She was gently  diuresed with resultant mild renal insufficiency which resolved quickly with discontinuation of lasix.  She has been transitioned to PO steroids and is nearing baseline respiratory status.  PT recommending SNF and [facility] close outpt pulmonary follow up.    STUDIES:  8/16 Dopper legs >> no DVT  8/17 Echo >> mod/severe LVH, EF 55 to 627% grade 1 diastolic dysfx  80/62CT chest >> borderline LAN with calcifications, patchy GGO with BTX and peribronchovascular nodularity, consolidation LLL  ABX:  Levaquin 8/16>>8/17 Ceftriaxone 8/18>>8/20 Levaquin 8/20>>>  Microbiology/Sepsis markers: Blood cultures x2 8/16>>>ngtd>>>    Filed Vitals:   05/14/14 1425 05/14/14 2134 05/15/14 0514 05/15/14 0759  BP: 166/89 159/70 166/86   Pulse: 93 100 96   Temp: 97.7 F (36.5 C) 97.8 F (36.6 C) 97.8 F (36.6 C)   TempSrc: Oral Oral Oral   Resp:  20 20   Height:      Weight:   105 lb 9.6 oz (47.9 kg)   SpO2: 95% 96% 97% 90%     Discharge Labs  BMET  Recent Labs Lab  05/11/14 1122 05/11/14 1127 05/11/14 1430 05/12/14 0207 05/13/14 0249 05/14/14 0352 05/15/14 0433  NA 136* 135*  --  137 138 137 133*  K 3.1* 3.0*  --  4.7 4.2 4.5 4.5  CL 92* 93*  --  93* 100 98 93*  CO2 29  --   --  _0 GLUCOSE 182* 176*  --  146* 139* 94 92  BUN 12 11  --  _1 CREATININE 0.92 0.90 1.07 1.39* 0.70 0.63 0.55  CALCIUM 9.2  --   --  8.5 8.3* 8.5 9.1     CBC   Recent Labs Lab 05/13/14 0249 05/14/14 0352 05/15/14 0433  HGB 10.3* 10.3* 11.4*  HCT 33.8* 34.1* 37.1  WBC 17.9* 19.1* 14.3*  PLT 226 261 248   Anti-Coagulation  Recent Labs Lab 05/11/14 1122  INR 0.96      Discharge Instructions   Discharge instructions    Complete by:  As directed   Continuous O2 @ 4-5L nasal cannula.     Increase activity slowly    Complete by:  As directed                 Follow-up Information   Follow up with  PARRETT,TAMMY, NP On 05/23/2014. (2:15pm )    Specialty:  Nurse Practitioner   Contact information:   [address] [place] [address] [phone]          Medication List    STOP taking these medications       amoxicillin-clavulanate 875-125 MG per tablet  Commonly known as:  AUGMENTIN     beclomethasone 80 MCG/ACT inhaler  Commonly known as:  QVAR     oxyCODONE-acetaminophen 5-325 MG per tablet  Commonly known as:  PERCOCET/ROXICET     oxymetazoline 0.05 % nasal spray  Commonly known as:  AFRIN     PERFOROMIST 20 MCG/2ML nebulizer solution  Generic drug:  formoterol     potassium chloride SA 20 MEQ tablet  Commonly known as:  K-DUR,KLOR-CON      TAKE these medications       ADVIL COLD/SINUS PO  Take 1 tablet by mouth daily as needed (sinus pressure).     albuterol (2.5 MG/3ML) 0.083% nebulizer solution  Commonly known as:  PROVENTIL  Take 2.5 mg by nebulization every 3 (three) hours as needed for wheezing or shortness of breath.     ALPRAZolam 0.25 MG tablet  Commonly known as:  XANAX  Take 0.25 mg by mouth 2 (two) times daily as needed for anxiety.     arformoterol 15 MCG/2ML Nebu  Commonly known as:  BROVANA  Take 2 mLs (15 mcg total) by nebulization every 12 (twelve) hours.     bisoprolol 5 MG tablet  Commonly known as:  ZEBETA  Take 2.5 mg by mouth daily.     budesonide 0.5 MG/2ML nebulizer solution  Commonly known as:  PULMICORT  Take 2 mLs (0.5 mg total) by nebulization 2 (two) times daily.     calcium-vitamin D 500-200 MG-UNIT per tablet  Commonly known as:  OSCAL WITH D  Take 1 tablet by mouth 2 (two) times daily.     cetirizine 10 MG tablet  Commonly known as:  ZYRTEC  Take 10 mg by mouth at bedtime.     ciclesonide 50 MCG/ACT nasal spray  Commonly known as:  OMNARIS  Place 2 sprays into both nostrils 2 (two) times daily.  clopidogrel 75 MG tablet  Commonly known as:  PLAVIX  Take 75 mg by mouth daily.     DYMISTA 137-50  MCG/ACT Susp  Generic drug:  Azelastine-Fluticasone  Place 1 spray into the nose 2 (two) times daily as needed. For seasonal allergies     famotidine 20 MG tablet  Commonly known as:  PEPCID  Take 20 mg by mouth at bedtime.     guaiFENesin 600 MG 12 hr tablet  Commonly known as:  MUCINEX  Take 600 mg by mouth 2 (two) times daily as needed for cough.     latanoprost 0.005 % ophthalmic solution  Commonly known as:  XALATAN  Place 1 drop into both eyes at bedtime.     levofloxacin 750 MG tablet  Commonly known as:  LEVAQUIN  1 tab po daily x 3 days     levothyroxine 100 MCG tablet  Commonly known as:  SYNTHROID, LEVOTHROID  Take 100 mcg by mouth daily before breakfast.     nisoldipine 8.5 MG 24 hr tablet  Commonly known as:  SULAR  Take 8.5 mg by mouth daily.     predniSONE 10 MG tablet  Commonly known as:  DELTASONE  Take 10 mg by mouth daily with breakfast.     predniSONE 20 MG tablet  Commonly known as:  DELTASONE  50m po daily x 4 days then back to 122mpo daily maintenance     PROBIOTIC DAILY Caps  Take 1 capsule by mouth every morning.     sertraline 25 MG tablet  Commonly known as:  ZOLOFT  Take 25 mg by mouth daily.     traMADol 50 MG tablet  Commonly known as:  ULTRAM  Take 1 tablet (50 mg total) by mouth every 12 (twelve) hours as needed for moderate pain.          Disposition: 03-Skilled Nursing Facility  Discharged Condition: WaDERIONNA SALVADORas met maximum benefit of inpatient care and is medically stable and cleared for discharge.  Patient is pending follow up as above.      Time spent on disposition:  Greater than 35 minutes.   Signed: Darlina SicilianNP 05/15/2014  10:18 AM Pager: [phone] or [phone]*Care during the described time interval was provided by me and/or other providers on the critical care team. I have reviewed this patient's available data, including medical history, events of note, physical examination and  test results as part of my evaluation.  ViChesley MiresMD [facility] 05/16/2014, 10:26 AM Pager:  [phone] 3pm call: [phone]

## 2014-05-15 NOTE — Progress Notes (Signed)
Occupational Therapy Treatment Patient Details Name: [name] MRN: [id number] DOB: 1933/03/22 Today's Date: 05/15/2014    History of present illness This 80 y.o. female admitted with respiratory distress, lethargy and decreased menation after sustaining rt. pubic rami fracture earlier in the day 9tripped over 02 cord at home).   Respiratory distress suspected due to pain medication use.   PMH includes:  severe chronic sinusitis/bronchectasis on home 02 and chronic steroid use.  Uses 3-4L 02 at home.    OT comments  Pt making slow, but steady progress.  She fatigues quickly.  Sats decreased to 83% on 6L 02, but rebounded to 91% quickly with pursed lip breathing.      Follow Up Recommendations  SNF    Equipment Recommendations  None recommended by OT    Recommendations for Other Services      Precautions / Restrictions Precautions Precautions: Fall Precaution Comments: monitor 02 sats       Mobility Bed Mobility Overal bed mobility: Needs Assistance Bed Mobility: Supine to Sit     Supine to sit: Mod assist     General bed mobility comments: Assist to move LEs off EOB and to lift trunk   Transfers Overall transfer level: Needs assistance Equipment used: Rolling walker (2 wheeled) Transfers: Sit to/from [facility] Sit to Stand: Min assist Stand pivot transfers: Min assist       General transfer comment: moves slowly.  Verbal cues for hand placement     Balance Overall balance assessment: Needs assistance Sitting-balance support: Feet supported Sitting balance-Leahy Scale: Fair     Standing balance support: Bilateral upper extremity supported Standing balance-Leahy Scale: Poor                     ADL                           Toilet Transfer: Minimal assistance;Stand-pivot;BSC           Functional mobility during ADLs: Minimal assistance;Rolling walker General ADL Comments: Pt continues to be limited by  pulmonary status.  Sats down to 83% with transfer.       Vision                     Perception     Praxis      Cognition   Behavior During Therapy: WFL for tasks assessed/performed Overall Cognitive Status: Within Functional Limits for tasks assessed                       Extremity/Trunk Assessment               Exercises     Shoulder Instructions       General Comments      Pertinent Vitals/ Pain       Pain Assessment: 0-10 Pain Score: 5  Pain Location: pelvis with activity Pain Descriptors / Indicators: Aching Pain Intervention(s): Limited activity within patient's tolerance;Monitored during session;Repositioned  Home Living                                          Prior Functioning/Environment              Frequency Min 2X/week     Progress Toward Goals  OT Goals(current goals can now be found in the  care plan section)  Progress towards OT goals: Progressing toward goals  Acute Rehab OT Goals Time For Goal Achievement: 05/27/14 Potential to Achieve Goals: Good ADL Goals Pt Will Perform Grooming: with min guard assist;standing Pt Will Perform Upper Body Bathing: with set-up;with supervision;sitting Pt Will Perform Lower Body Bathing: with min guard assist;sit to/from stand Pt Will Perform Lower Body Dressing: with min guard assist;sit to/from stand Pt Will Transfer to Toilet: with min guard assist;ambulating;regular height toilet;grab bars Pt Will Perform Toileting - Clothing Manipulation and hygiene: with min guard assist;sit to/from stand Pt/caregiver will Perform Home Exercise Program: Increased strength;Both right and left upper extremity;With theraband;Independently;With written HEP provided  Plan Discharge plan remains appropriate    Co-evaluation                 End of Session Equipment Utilized During Treatment: Oxygen   Activity Tolerance Patient limited by fatigue   Patient Left in  chair;with call bell/phone within reach   Nurse Communication Mobility status        Time: 8341-9622 OT Time Calculation (min): 19 min  Charges: OT General Charges $OT Visit: 1 Procedure OT Treatments $Therapeutic Activity: 8-22 mins  Berton Butrick M 05/15/2014, 5:10 PM

## 2014-05-16 DIAGNOSIS — I519 Heart disease, unspecified: Secondary | ICD-10-CM

## 2014-05-16 DIAGNOSIS — J189 Pneumonia, unspecified organism: Secondary | ICD-10-CM

## 2014-05-16 DIAGNOSIS — G9340 Encephalopathy, unspecified: Secondary | ICD-10-CM

## 2014-05-16 DIAGNOSIS — J471 Bronchiectasis with (acute) exacerbation: Secondary | ICD-10-CM

## 2014-05-16 DIAGNOSIS — J9610 Chronic respiratory failure, unspecified whether with hypoxia or hypercapnia: Secondary | ICD-10-CM

## 2014-05-16 HISTORY — DX: Heart disease, unspecified: I51.9

## 2014-05-16 HISTORY — DX: Chronic respiratory failure, unspecified whether with hypoxia or hypercapnia: J96.10

## 2014-05-16 HISTORY — DX: Pneumonia, unspecified organism: J18.9

## 2014-05-16 HISTORY — DX: Bronchiectasis with (acute) exacerbation: J47.1

## 2014-05-16 HISTORY — DX: Encephalopathy, unspecified: G93.40

## 2014-05-16 NOTE — Clinical Social Work Note (Signed)
Clinical Social Worker facilitated patient discharge including contacting patient family and facility to confirm patient discharge plans.  Clinical information faxed to facility and family agreeable with plan.  CSW arranged ambulance transport via [facility] to [facility].  RN to call report prior to discharge.  Clinical Social Worker will sign off for now as social work intervention is no longer needed. Please consult [place] again if new need arises.  Barbette Or, [place]

## 2014-05-16 NOTE — Progress Notes (Signed)
IV d/c at this time; pt to d/c to [facility] today; will await transport.

## 2014-05-16 NOTE — Clinical Social Work Note (Signed)
Clinical Social Worker attempted to discharge patient to [facility] yesterday, however [facility] number was not received until 05/16/14.  CSW to facilitate patient discharge to [facility] today.  Facility prepared and patient family aware.  Barbette Or, [place]

## 2014-05-18 LAB — CULTURE, BLOOD (ROUTINE X 2)
CULTURE: NO GROWTH
Culture: NO GROWTH

## 2014-05-20 ENCOUNTER — Non-Acute Institutional Stay (SKILLED_NURSING_FACILITY): Payer: Medicare Other | Admitting: Nurse Practitioner

## 2014-05-20 ENCOUNTER — Encounter: Payer: Self-pay | Admitting: Nurse Practitioner

## 2014-05-20 DIAGNOSIS — I5189 Other ill-defined heart diseases: Secondary | ICD-10-CM

## 2014-05-20 DIAGNOSIS — F32A Depression, unspecified: Secondary | ICD-10-CM

## 2014-05-20 DIAGNOSIS — J189 Pneumonia, unspecified organism: Secondary | ICD-10-CM

## 2014-05-20 DIAGNOSIS — S32591S Other specified fracture of right pubis, sequela: Secondary | ICD-10-CM

## 2014-05-20 DIAGNOSIS — F329 Major depressive disorder, single episode, unspecified: Secondary | ICD-10-CM

## 2014-05-20 DIAGNOSIS — R413 Other amnesia: Secondary | ICD-10-CM

## 2014-05-20 DIAGNOSIS — E039 Hypothyroidism, unspecified: Secondary | ICD-10-CM

## 2014-05-20 DIAGNOSIS — I10 Essential (primary) hypertension: Secondary | ICD-10-CM

## 2014-05-20 DIAGNOSIS — J310 Chronic rhinitis: Secondary | ICD-10-CM

## 2014-05-20 DIAGNOSIS — K219 Gastro-esophageal reflux disease without esophagitis: Secondary | ICD-10-CM

## 2014-05-20 DIAGNOSIS — I519 Heart disease, unspecified: Secondary | ICD-10-CM

## 2014-05-20 DIAGNOSIS — F3289 Other specified depressive episodes: Secondary | ICD-10-CM

## 2014-05-20 DIAGNOSIS — K5900 Constipation, unspecified: Secondary | ICD-10-CM

## 2014-05-20 DIAGNOSIS — J471 Bronchiectasis with (acute) exacerbation: Secondary | ICD-10-CM

## 2014-05-20 DIAGNOSIS — S32599A Other specified fracture of unspecified pubis, initial encounter for closed fracture: Secondary | ICD-10-CM | POA: Insufficient documentation

## 2014-05-20 DIAGNOSIS — IMO0002 Reserved for concepts with insufficient information to code with codable children: Secondary | ICD-10-CM

## 2014-05-20 NOTE — Assessment & Plan Note (Signed)
Doesn't know where she is at, introduced staff to her mother who has been deceased, though she met with me before even if this is the first time I have met with her. But she is able to voice her pain is 6-7/10 and location of her pain is in her right groin/buttock and no BM since Sunday. Will have Memory Test. Observe the patient.

## 2014-05-20 NOTE — Assessment & Plan Note (Signed)
Takes Cetirizine 10mg  daily, Ciclesonide nasal spray bid.

## 2014-05-20 NOTE — Progress Notes (Signed)
Patient ID: [name], female   DOB: 1933-02-17, 80 y.o.   MRN: [id number]   Code Status: Full Code  Allergies  Allergen Reactions  . Chocolate Cough  . Codeine     Unknown reaction  . Erythromycin Ethylsuccinate     REACTION: unspecified  . Iodine   . Iohexol      Desc: pt states allergy many years ago--unable to remember type of allergy--pre med in future slg 06/13/08   . Sulfamethoxazole     REACTION: unspecified  . Septra [Bactrim] Itching and Rash    Chief Complaint  Patient presents with  . Medical Management of Chronic Issues  . Acute Visit  . Hospitalization Follow-up    PNA, right pubic ramus fx,  depression, HTN    HPI: Patient is a 80 y.o. female seen in the SNF at [facility] today for evaluation of hospitalization for HCAP, Rt pubic ramus fx after fall (non-operative),  and other chronic medical conditions.    Hospitalized 05/11/2014-05/16/2014 for HCAP , R pubic ramus fx-PRN tramadol , resolved acute encephalopathy 2nd to medications and respiratory failure-Respiratory failurerespiratory improved with narcan, bipap, nebs, IV abx and solumedrol and required no further bipap. She received no further narcotics and pain has been well   Problem List Items Addressed This Visit   BRONCHIECTASIS W/ACUTE EXACERBATION     Maintained on Brovana bid, Budesonide bid, and Prednisone $RemoveBefore'10mg'INspUSyRPgGtZ$      Chronic rhinitis     Takes Cetirizine $RemoveBeforeDE'10mg'pupBxIUiOPBxTTj$  daily, Ciclesonide nasal spray bid.     Depression     Hold narcotics to avoid oversedation  Continue zoloft and PRN xanax      GERD     Stable. Continue Pepcid $RemoveBeforeD'20mg'bCKYhzZRGqjsRk$  daily.     HCAP (healthcare-associated pneumonia) - Primary     Acute on chronic respiratory failure  HCAP  Bronchiectasis, sinusitis  Cont pulmicort, brovana, omnaris  PRN albuterol  Levaquin for total 7 days abx  Prednisone with taper back to $Remov'10mg'qjEUGA$ /day  Continuous O2  Close outpt pulm f/u      HYPERTENSION, BENIGN     Hx HTN  Hx TIA  D/c plan --  Cont  plvix, bisoprolol, sular      HYPOTHYROIDISM     Hypothyroid  Cont synthroid  F/u TSH      Left ventricular diastolic dysfunction, NYHA class 1     8/17 Echo >> mod/severe LVH, EF 55 to 23%, grade 1 diastolic dysfx. Gentle diuresed while in hospital.      Memory deficit     Doesn't know where she is at, introduced staff to her mother who has been deceased, though she met with me before even if this is the first time I have met with her. But she is able to voice her pain is 6-7/10 and location of her pain is in her right groin/buttock and no BM since Sunday. Will have Memory Test. Observe the patient.     Pubic ramus fracture     PT  Ultram for pain  The right     Unspecified constipation     No BM since Sunday(Tuesday today)-Senokot S II qhs and Miralax daily.        Review of Systems:  Review of Systems  Constitutional: Positive for malaise/fatigue. Negative for fever, chills, weight loss and diaphoresis.       Generalized.   HENT: Positive for hearing loss. Negative for congestion, ear discharge, ear pain, nosebleeds, sore throat and tinnitus.   Eyes: Negative for  blurred vision, double vision, photophobia, pain, discharge and redness.  Respiratory: Positive for cough and shortness of breath. Negative for hemoptysis, sputum production, wheezing and stridor.   Cardiovascular: Positive for PND. Negative for chest pain, palpitations, orthopnea, claudication and leg swelling.  Gastrointestinal: Positive for constipation. Negative for heartburn, nausea, vomiting, abdominal pain, diarrhea, blood in stool and melena.  Genitourinary: Negative for dysuria, urgency, frequency, hematuria and flank pain.  Musculoskeletal: Positive for falls and joint pain. Negative for back pain, myalgias and neck pain.       Right groin  Skin: Negative for itching and rash.  Neurological: Positive for weakness. Negative for dizziness, tingling, tremors, sensory change, speech change, focal weakness,  seizures, loss of consciousness and headaches.  Endo/Heme/Allergies: Negative for environmental allergies and polydipsia. Does not bruise/bleed easily.  Psychiatric/Behavioral: Positive for memory loss. Negative for depression, suicidal ideas, hallucinations and substance abuse. The patient is not nervous/anxious and does not have insomnia.      Past Medical History  Diagnosis Date  . Unspecified hypothyroidism   . Bronchiectasis with acute exacerbation   . Other and unspecified hyperlipidemia   . TIA (transient ischemic attack)   . Osteoporosis, unspecified   . Asthma   . Esophageal reflux   . IBS (irritable bowel syndrome)   . Diverticulosis   . Hemorrhoids   . Hypothyroidism   . Schatzki's ring   . Hiatal hernia   . Tubular adenoma of colon 12/2008   Past Surgical History  Procedure Laterality Date  . Cholecystectomy  1995  . Nasal sinus surgery    . Tonsillectomy    . Foot surgery    . Small intestine surgery  05/2008   Social History:   reports that she has never smoked. She has never used smokeless tobacco. She reports that she does not drink alcohol or use illicit drugs.  Family History  Problem Relation Age of Onset  . Hypertension Sister   . Heart disease Mother   . Heart disease Father   . Stroke Father   . Multiple myeloma Maternal Aunt   . Colon cancer Mother     Medications: Patient's Medications  New Prescriptions   No medications on file  Previous Medications   ALBUTEROL (PROVENTIL) (2.5 MG/3ML) 0.083% NEBULIZER SOLUTION    Take 2.5 mg by nebulization every 3 (three) hours as needed for wheezing or shortness of breath.    ALPRAZOLAM (XANAX) 0.25 MG TABLET    Take 0.25 mg by mouth 2 (two) times daily as needed for anxiety.    ARFORMOTEROL (BROVANA) 15 MCG/2ML NEBU    Take 2 mLs (15 mcg total) by nebulization every 12 (twelve) hours.   AZELASTINE-FLUTICASONE (DYMISTA) 137-50 MCG/ACT SUSP    Place 1 spray into the nose 2 (two) times daily as needed. For  seasonal allergies   BISOPROLOL (ZEBETA) 5 MG TABLET    Take 2.5 mg by mouth daily.   BUDESONIDE (PULMICORT) 0.5 MG/2ML NEBULIZER SOLUTION    Take 2 mLs (0.5 mg total) by nebulization 2 (two) times daily.   CALCIUM-VITAMIN D (OSCAL WITH D) 500-200 MG-UNIT PER TABLET    Take 1 tablet by mouth 2 (two) times daily.     CETIRIZINE (ZYRTEC) 10 MG TABLET    Take 10 mg by mouth at bedtime.   CICLESONIDE (OMNARIS) 50 MCG/ACT NASAL SPRAY    Place 2 sprays into both nostrils 2 (two) times daily.   CLOPIDOGREL (PLAVIX) 75 MG TABLET    Take 75 mg by mouth daily.  FAMOTIDINE (PEPCID) 20 MG TABLET    Take 20 mg by mouth at bedtime.    GUAIFENESIN (MUCINEX) 600 MG 12 HR TABLET    Take 600 mg by mouth 2 (two) times daily as needed for cough.    LATANOPROST (XALATAN) 0.005 % OPHTHALMIC SOLUTION    Place 1 drop into both eyes at bedtime.    LEVOFLOXACIN (LEVAQUIN) 750 MG TABLET    1 tab po daily x 3 days   LEVOTHYROXINE (SYNTHROID, LEVOTHROID) 100 MCG TABLET    Take 100 mcg by mouth daily before breakfast.   NISOLDIPINE (SULAR) 8.5 MG 24 HR TABLET    Take 8.5 mg by mouth daily.   PREDNISONE (DELTASONE) 10 MG TABLET    Take 10 mg by mouth daily with breakfast.   PREDNISONE (DELTASONE) 20 MG TABLET    '20mg'$  po daily x 4 days then back to $Remov'10mg'vklSCc$  po daily maintenance   PROBIOTIC PRODUCT (PROBIOTIC DAILY) CAPS    Take 1 capsule by mouth every morning.   PSEUDOEPHEDRINE-IBUPROFEN (ADVIL COLD/SINUS PO)    Take 1 tablet by mouth daily as needed (sinus pressure).   SERTRALINE (ZOLOFT) 25 MG TABLET    Take 25 mg by mouth daily.   TRAMADOL (ULTRAM) 50 MG TABLET    Take 1 tablet (50 mg total) by mouth every 12 (twelve) hours as needed for moderate pain.  Modified Medications   No medications on file  Discontinued Medications   No medications on file     Physical Exam: Physical Exam  Constitutional: She appears well-developed and well-nourished. No distress.  HENT:  Head: Normocephalic and atraumatic.  Right Ear:  External ear normal.  Left Ear: External ear normal.  Nose: Nose normal.  Mouth/Throat: Oropharynx is clear and moist. No oropharyngeal exudate.  Eyes: Conjunctivae and EOM are normal. Pupils are equal, round, and reactive to light. Right eye exhibits no discharge. Left eye exhibits no discharge. No scleral icterus.  Neck: Normal range of motion. Neck supple. No JVD present. No tracheal deviation present. No thyromegaly present.  Cardiovascular: Normal rate, regular rhythm, normal heart sounds and intact distal pulses.   No murmur heard. Pulmonary/Chest: No stridor. No respiratory distress. She has no wheezes. She has rales. She exhibits no tenderness.  O2 dependent. accessory muscle use.   Abdominal: She exhibits no distension and no mass. There is no tenderness. There is no rebound and no guarding.  Musculoskeletal: Normal range of motion. She exhibits tenderness. She exhibits no edema.  In right groin and buttock.   Lymphadenopathy:    She has no cervical adenopathy.  Neurological: She is alert. She has normal reflexes. She displays normal reflexes. No cranial nerve deficit. She exhibits normal muscle tone. Coordination normal.  Skin: Skin is warm and dry. No rash noted. She is not diaphoretic. No erythema. No pallor.  Psychiatric: She has a normal mood and affect. Her behavior is normal. Thought content normal.    Filed Vitals:   05/20/14 1449  BP: 180/80  Pulse: 78  Temp: 98.5 F (36.9 C)  TempSrc: Tympanic  Resp: 22      Labs reviewed: Basic Metabolic Panel:  Recent Labs  06/12/13 0924  08/07/13 1254  05/12/14 0207 05/13/14 0249 05/14/14 0352 05/15/14 0433  NA 132*  < > 130*  < > 137 138 137 133*  K 3.5  < > 3.3*  < > 4.7 4.2 4.5 4.5  CL 94*  < > 93*  < > 93* 100 98 93*  CO2  32  < > 32  < > $R'29 28 29 30  'St$ GLUCOSE 93  < > 102*  < > 146* 139* 94 92  BUN 11  < > 13  < > $R'22 20 18 16  'bN$ CREATININE 0.9  < > 0.9  < > 1.39* 0.70 0.63 0.55  CALCIUM 8.9  < > 8.5  < > 8.5  8.3* 8.5 9.1  TSH 8.58*  --  4.33  --  0.917  --   --   --   < > = values in this interval not displayed. Liver Function Tests:  Recent Labs  06/12/13 0924 05/11/14 1122 05/13/14 0249  AST 21 40* 30  ALT 14 35 26  ALKPHOS 53 74 52  BILITOT 0.4 0.2* 0.2*  PROT 7.2 7.8 6.3  ALBUMIN 3.5 3.5 2.6*   No results found for this basename: LIPASE, AMYLASE,  in the last 8760 hours No results found for this basename: AMMONIA,  in the last 8760 hours CBC:  Recent Labs  05/11/14 1122  05/13/14 0249 05/14/14 0352 05/15/14 0433  WBC 29.5*  < > 17.9* 19.1* 14.3*  NEUTROABS 23.9*  --  17.3* 16.2*  --   HGB 12.7  < > 10.3* 10.3* 11.4*  HCT 42.3  < > 33.8* 34.1* 37.1  MCV 83.6  < > 80.3 80.4 78.1  PLT 372  < > 226 261 248  < > = values in this interval not displayed. Lipid Panel:  Recent Labs  06/12/13 0924  CHOL 181  HDL 75.00  LDLCALC 86  TRIG 98.0  CHOLHDL 2    Past Procedures:  8/16 Dopper legs >> no DVT  8/17 Echo >> mod/severe LVH, EF 55 to 01%, grade 1 diastolic dysfx  0/93 CT chest >> borderline LAN with calcifications, patchy GGO with BTX and peribronchovascular nodularity, consolidation LLL   Assessment/Plan HCAP (healthcare-associated pneumonia) Acute on chronic respiratory failure  HCAP  Bronchiectasis, sinusitis  Cont pulmicort, brovana, omnaris  PRN albuterol  Levaquin for total 7 days abx  Prednisone with taper back to $Remov'10mg'AFkEcX$ /day  Continuous O2  Close outpt pulm f/u    HYPERTENSION, BENIGN Hx HTN  Hx TIA  D/c plan --  Cont plvix, bisoprolol, sular    HYPOTHYROIDISM Hypothyroid  Cont synthroid  F/u TSH    Depression Hold narcotics to avoid oversedation  Continue zoloft and PRN xanax    Pubic ramus fracture PT  Ultram for pain  The right   GERD Stable. Continue Pepcid $RemoveBeforeD'20mg'MeiFHNnkXyvGaB$  daily.   Chronic rhinitis Takes Cetirizine $RemoveBeforeDE'10mg'AslmCGAQuDaHbAV$  daily, Ciclesonide nasal spray bid.   BRONCHIECTASIS W/ACUTE EXACERBATION Maintained on Brovana bid, Budesonide  bid, and Prednisone $RemoveBefore'10mg'AGWrweBmIsOnn$    Left ventricular diastolic dysfunction, NYHA class 1 8/17 Echo >> mod/severe LVH, EF 55 to 23%, grade 1 diastolic dysfx. Gentle diuresed while in hospital.    Unspecified constipation No BM since Sunday(Tuesday today)-Senokot S II qhs and Miralax daily.   Memory deficit Doesn't know where she is at, introduced staff to her mother who has been deceased, though she met with me before even if this is the first time I have met with her. But she is able to voice her pain is 6-7/10 and location of her pain is in her right groin/buttock and no BM since Sunday. Will have Memory Test. Observe the patient.     Family/ Staff Communication: observe the patient.   Goals of Care: SNF  Labs/tests ordered: TSH

## 2014-05-20 NOTE — Assessment & Plan Note (Signed)
Maintained on Brovana bid, Budesonide bid, and Prednisone 10mg 

## 2014-05-20 NOTE — Assessment & Plan Note (Signed)
Stable.  Continue Pepcid 20mg daily

## 2014-05-20 NOTE — Assessment & Plan Note (Signed)
Hx HTN  Hx TIA  D/c plan --  Cont plvix, bisoprolol, sular

## 2014-05-20 NOTE — Assessment & Plan Note (Signed)
No BM since Sunday(Tuesday today)-Senokot S II qhs and Miralax daily.

## 2014-05-20 NOTE — Assessment & Plan Note (Signed)
PT  Ultram for pain  The right

## 2014-05-20 NOTE — Assessment & Plan Note (Signed)
Hypothyroid  Cont synthroid  F/u TSH

## 2014-05-20 NOTE — Assessment & Plan Note (Signed)
Hold narcotics to avoid oversedation  Continue zoloft and PRN xanax

## 2014-05-20 NOTE — Assessment & Plan Note (Signed)
8/17 Echo >> mod/severe LVH, EF 55 to 11%, grade 1 diastolic dysfx. Gentle diuresed while in hospital.

## 2014-05-20 NOTE — Assessment & Plan Note (Signed)
Acute on chronic respiratory failure  HCAP  Bronchiectasis, sinusitis  Cont pulmicort, brovana, omnaris  PRN albuterol  Levaquin for total 7 days abx  Prednisone with taper back to 10mg /day  Continuous O2  Close outpt pulm f/u

## 2014-05-22 ENCOUNTER — Other Ambulatory Visit: Payer: Self-pay | Admitting: Nurse Practitioner

## 2014-05-22 ENCOUNTER — Encounter: Payer: Self-pay | Admitting: Internal Medicine

## 2014-05-22 ENCOUNTER — Non-Acute Institutional Stay (SKILLED_NURSING_FACILITY): Payer: Medicare Other | Admitting: Internal Medicine

## 2014-05-22 DIAGNOSIS — E871 Hypo-osmolality and hyponatremia: Secondary | ICD-10-CM

## 2014-05-22 DIAGNOSIS — I519 Heart disease, unspecified: Secondary | ICD-10-CM

## 2014-05-22 DIAGNOSIS — D649 Anemia, unspecified: Secondary | ICD-10-CM

## 2014-05-22 DIAGNOSIS — F329 Major depressive disorder, single episode, unspecified: Secondary | ICD-10-CM | POA: Insufficient documentation

## 2014-05-22 DIAGNOSIS — E039 Hypothyroidism, unspecified: Secondary | ICD-10-CM

## 2014-05-22 DIAGNOSIS — I10 Essential (primary) hypertension: Secondary | ICD-10-CM | POA: Insufficient documentation

## 2014-05-22 DIAGNOSIS — IMO0002 Reserved for concepts with insufficient information to code with codable children: Secondary | ICD-10-CM

## 2014-05-22 DIAGNOSIS — K5901 Slow transit constipation: Secondary | ICD-10-CM

## 2014-05-22 DIAGNOSIS — E876 Hypokalemia: Secondary | ICD-10-CM

## 2014-05-22 DIAGNOSIS — K219 Gastro-esophageal reflux disease without esophagitis: Secondary | ICD-10-CM

## 2014-05-22 DIAGNOSIS — R413 Other amnesia: Secondary | ICD-10-CM | POA: Insufficient documentation

## 2014-05-22 DIAGNOSIS — R0902 Hypoxemia: Secondary | ICD-10-CM

## 2014-05-22 DIAGNOSIS — F3289 Other specified depressive episodes: Secondary | ICD-10-CM

## 2014-05-22 DIAGNOSIS — K5900 Constipation, unspecified: Secondary | ICD-10-CM

## 2014-05-22 DIAGNOSIS — J9610 Chronic respiratory failure, unspecified whether with hypoxia or hypercapnia: Secondary | ICD-10-CM

## 2014-05-22 DIAGNOSIS — K589 Irritable bowel syndrome without diarrhea: Secondary | ICD-10-CM | POA: Insufficient documentation

## 2014-05-22 DIAGNOSIS — J189 Pneumonia, unspecified organism: Secondary | ICD-10-CM

## 2014-05-22 DIAGNOSIS — G9340 Encephalopathy, unspecified: Secondary | ICD-10-CM

## 2014-05-22 DIAGNOSIS — S32591S Other specified fracture of right pubis, sequela: Secondary | ICD-10-CM

## 2014-05-22 DIAGNOSIS — J471 Bronchiectasis with (acute) exacerbation: Secondary | ICD-10-CM

## 2014-05-22 LAB — TSH: TSH: 16.69 u[IU]/mL — AB (ref 0.41–5.90)

## 2014-05-22 NOTE — Progress Notes (Signed)
Patient ID: [name], female   DOB: Dec 25, 1932, 80 y.o.   MRN: [id number]    Location:  [facility]   Place of Service: SNF (31)  Extended Emergency Contact Information Primary Emergency Contact: Brayton Caves, [place] Phone: [phone] Mobile Phone: [phone] Relation: Sister Secondary Emergency Contact: Siegfried,Ed  [place] Phone: [phone] Relation: Nephew   Chief Complaint  Patient presents with  . New Admit To SNF    following hospitalization    HPI:  Hospitalized 05/11/14 to 05/16/14 for bronchiectasis with acute exacerbation. Had acute respiratory failure on chronic respiratory failure. Thought to have HCAP. She is followed by Dr. Melvyn Novas for her pulmonary issues.  Was encephalopathic during hospitalization and remains comfused and sleepy. She had a memory deficit noted in the past.  She is on chronic prednisone for her bronchitis.  She had left diastolic dysfunction on 2D Echo. LVEF was 60%.  05/11/14 Acute mildly displaced right pubic symphyseal fracture extending to superior pubic ramus.  There are multiple other chronic problems as noted below.   Past Medical History  Diagnosis Date  . Bronchiectasis with acute exacerbation 05/16/14    acute respiratory failure   . Other and unspecified hyperlipidemia   . TIA (transient ischemic attack)   . Osteoporosis, unspecified   . Asthma   . Esophageal reflux   . IBS (irritable bowel syndrome)   . Diverticulosis   . Hemorrhoids   . Hypothyroidism   . Schatzki's ring   . Hiatal hernia   . Tubular adenoma of colon 12/2008  . Chronic respiratory failure 05/16/14  . HCAP (healthcare-associated pneumonia) 05/16/14  . Sinusitis   . GERD (gastroesophageal reflux disease)   . HTN (hypertension)   . Encephalopathy 05/16/14    noted during hospitalization  . Depressive disorder, not elsewhere classified   . Diastolic dysfunction, left  ventricle 05/16/14  . Memory loss   . Pubic ramus fracture   . Hiatal hernia   . Tubular adenoma of colon   . Hypoxia   . Anemia, unspecified   . Constipation     Past Surgical History  Procedure Laterality Date  . Cholecystectomy  1995  . Nasal sinus surgery    . Tonsillectomy    . Foot surgery    . Small intestine surgery  05/2008    History   Social History  . Marital Status: Single    Spouse Name: N/A    Number of Children: N/A  . Years of Education: N/A   Occupational History  . Retired    Social History Main Topics  . Smoking status: Never Smoker   . Smokeless tobacco: Never Used  . Alcohol Use: No  . Drug Use: No  . Sexual Activity: Not on file   Other Topics Concern  . Not on file   Social History Narrative  . No narrative on file     reports that she has never smoked. She has never used smokeless tobacco. She reports that she does not drink alcohol or use illicit drugs.  Immunization History  Administered Date(s) Administered  . H1N1 09/23/2008  . Influenza Split 08/01/2011, 07/18/2012  . Influenza Whole 07/21/2008, 07/22/2009, 07/29/2010  . Influenza,inj,Quad PF,36+ Mos 06/12/2013  . Pneumococcal Conjugate-13 02/10/2014  . Pneumococcal Polysaccharide-23 06/26/2008    Allergies  Allergen Reactions  . Chocolate Cough  . Codeine     Unknown reaction  . Erythromycin  Ethylsuccinate     REACTION: unspecified  . Iodine   . Iohexol      Desc: pt states allergy many years ago--unable to remember type of allergy--pre med in future slg 06/13/08   . Sulfamethoxazole     REACTION: unspecified  . Septra [Bactrim] Itching and Rash    Medications: Patient's Medications  New Prescriptions   No medications on file  Previous Medications   ALBUTEROL (PROVENTIL) (2.5 MG/3ML) 0.083% NEBULIZER SOLUTION    Take 2.5 mg by nebulization every 3 (three) hours as needed for wheezing or shortness of breath.    ALPRAZOLAM (XANAX) 0.25 MG TABLET    Take 0.25 mg by  mouth 2 (two) times daily as needed for anxiety.    ARFORMOTEROL (BROVANA) 15 MCG/2ML NEBU    Take 2 mLs (15 mcg total) by nebulization every 12 (twelve) hours.   AZELASTINE-FLUTICASONE (DYMISTA) 137-50 MCG/ACT SUSP    Place 1 spray into the nose 2 (two) times daily as needed. For seasonal allergies   BISOPROLOL (ZEBETA) 5 MG TABLET    Take 2.5 mg by mouth daily.   BUDESONIDE (PULMICORT) 0.5 MG/2ML NEBULIZER SOLUTION    Take 2 mLs (0.5 mg total) by nebulization 2 (two) times daily.   CALCIUM-VITAMIN D (OSCAL WITH D) 500-200 MG-UNIT PER TABLET    Take 1 tablet by mouth 2 (two) times daily.     CETIRIZINE (ZYRTEC) 10 MG TABLET    Take 10 mg by mouth at bedtime.   CICLESONIDE (OMNARIS) 50 MCG/ACT NASAL SPRAY    Place 2 sprays into both nostrils 2 (two) times daily.   CLOPIDOGREL (PLAVIX) 75 MG TABLET    Take 75 mg by mouth daily.   DOXYCYCLINE (VIBRA-TABS) 100 MG TABLET       FAMOTIDINE (PEPCID) 20 MG TABLET    Take 20 mg by mouth at bedtime.    GUAIFENESIN (MUCINEX) 600 MG 12 HR TABLET    Take 600 mg by mouth 2 (two) times daily as needed for cough.    LANSOPRAZOLE (PREVACID) 15 MG CAPSULE       LATANOPROST (XALATAN) 0.005 % OPHTHALMIC SOLUTION    Place 1 drop into both eyes at bedtime.    LEVOFLOXACIN (LEVAQUIN) 750 MG TABLET    1 tab po daily x 3 days   LEVOTHYROXINE (SYNTHROID, LEVOTHROID) 100 MCG TABLET    Take 100 mcg by mouth daily before breakfast.   NISOLDIPINE (SULAR) 8.5 MG 24 HR TABLET    Take 8.5 mg by mouth daily.   PERFOROMIST 20 MCG/2ML NEBULIZER SOLUTION       POTASSIUM CHLORIDE SA (K-DUR,KLOR-CON) 20 MEQ TABLET       PREDNISONE (DELTASONE) 10 MG TABLET    Take 10 mg by mouth daily with breakfast.   PREDNISONE (DELTASONE) 20 MG TABLET    20mg  po daily x 4 days then back to 10mg  po daily maintenance   PROBIOTIC PRODUCT (PROBIOTIC DAILY) CAPS    Take 1 capsule by mouth every morning.   PSEUDOEPHEDRINE-IBUPROFEN (ADVIL COLD/SINUS PO)    Take 1 tablet by mouth daily as needed (sinus  pressure).   QVAR 80 MCG/ACT INHALER       SERTRALINE (ZOLOFT) 25 MG TABLET    Take 25 mg by mouth daily.   TRAMADOL (ULTRAM) 50 MG TABLET    Take 1 tablet (50 mg total) by mouth every 12 (twelve) hours as needed for moderate pain.  Modified Medications   No medications on file  Discontinued Medications  No medications on file     Review of Systems  Constitutional: Positive for fever (low grade), activity change and fatigue. Negative for chills, diaphoresis and appetite change.  HENT: Positive for hearing loss. Negative for sinus pressure, sore throat, trouble swallowing and voice change.   Eyes: Negative.   Respiratory: Positive for cough, choking and shortness of breath.        O2 dependent  Cardiovascular: Negative for chest pain, palpitations and leg swelling.  Gastrointestinal: Positive for constipation. Negative for nausea, abdominal pain, diarrhea and abdominal distention.       Hx tubular ademoa of colon  Endocrine: Negative for cold intolerance, heat intolerance, polydipsia, polyphagia and polyuria.  Genitourinary: Negative.   Musculoskeletal:       Recently documented right pubic ramus fx.  Skin: Negative.   Allergic/Immunologic: Negative.   Neurological: Positive for weakness. Negative for seizures, syncope, facial asymmetry, speech difficulty, light-headedness, numbness and headaches.       Confused and sleepy.  Hematological:       Anemia  Psychiatric/Behavioral: Negative.     Filed Vitals:   05/22/14 1326  BP: 164/90  Pulse: 86  Temp: 97.2 F (36.2 C)  Resp: 18  Height: 5\' 1"  (1.549 m)  Weight: 118 lb (53.524 kg)  SpO2: 96%   Body mass index is 22.31 kg/(m^2).  Physical Exam  Constitutional: She appears well-developed and well-nourished. No distress.  frail  HENT:  Head: Normocephalic and atraumatic.  Right Ear: External ear normal.  Left Ear: External ear normal.  Nose: Nose normal.  Mouth/Throat: Oropharynx is clear and moist. No oropharyngeal  exudate.  Eyes: Conjunctivae and EOM are normal. Pupils are equal, round, and reactive to light. Right eye exhibits no discharge. Left eye exhibits no discharge. No scleral icterus.  Neck: Normal range of motion. Neck supple. No JVD present. No tracheal deviation present. No thyromegaly present.  Cardiovascular: Normal rate, regular rhythm, normal heart sounds and intact distal pulses.   No murmur heard. Pulmonary/Chest: No stridor. No respiratory distress. She has no wheezes. She has rales. She exhibits no tenderness.  O2 dependent. accessory muscle use.   Abdominal: She exhibits no distension and no mass. There is no tenderness. There is no rebound and no guarding.  Musculoskeletal: Normal range of motion. She exhibits tenderness. She exhibits no edema.  In right groin and buttock.   Lymphadenopathy:    She has no cervical adenopathy.  Neurological: She is alert. She has normal reflexes. No cranial nerve deficit. She exhibits normal muscle tone. Coordination normal.  Confused, sleepy, disoriented  Skin: Skin is warm and dry. No rash noted. She is not diaphoretic. No erythema. No pallor.  Psychiatric: She has a normal mood and affect. Her behavior is normal. Thought content normal.     Labs reviewed: Admission on 05/11/2014, Discharged on 05/16/2014  No results displayed because visit has over 200 results.    Admission on 05/11/2014, Discharged on 05/11/2014  Component Date Value Ref Range Status  . Color, Urine 05/11/2014 YELLOW  YELLOW Final  . APPearance 05/11/2014 CLEAR  CLEAR Final  . Specific Gravity, Urine 05/11/2014 1.013  1.005 - 1.030 Final  . pH 05/11/2014 7.5  5.0 - 8.0 Final  . Glucose, UA 05/11/2014 NEGATIVE  NEGATIVE mg/dL Final  . Hgb urine dipstick 05/11/2014 SMALL* NEGATIVE Final  . Bilirubin Urine 05/11/2014 NEGATIVE  NEGATIVE Final  . Ketones, ur 05/11/2014 NEGATIVE  NEGATIVE mg/dL Final  . Protein, ur 05/11/2014 NEGATIVE  NEGATIVE mg/dL Final  .  Urobilinogen,  UA 05/11/2014 0.2  0.0 - 1.0 mg/dL Final  . Nitrite 05/11/2014 NEGATIVE  NEGATIVE Final  . Leukocytes, UA 05/11/2014 SMALL* NEGATIVE Final  . Squamous Epithelial / LPF 05/11/2014 RARE  RARE Final  . WBC, UA 05/11/2014 0-2  <3 WBC/hpf Final  . RBC / HPF 05/11/2014 0-2  <3 RBC/hpf Final  . Bacteria, UA 05/11/2014 FEW* RARE Final  . Urine-Other 05/11/2014 MUCOUS PRESENT   Final    Dg Chest 1 View  05/11/2014   CLINICAL DATA:  Fall, shortness of breath.  EXAM: CHEST - 1 VIEW  COMPARISON:  CT of the chest August 13, 2013 and chest radiograph August 07, 2013  FINDINGS: Increased lung volumes, diffuse reticular nodular densities, slightly decreased on the right corresponding to known bronchiectasis. No pleural effusions or focal consolidations. No pneumothorax  The cardiac silhouette appears mildly enlarged, mediastinal silhouette is nonsuspicious.  Chronic right humeral surgical neck fracture.  IMPRESSION: Mild cardiomegaly.  Diffuse chronic change of the lungs corresponding to known bronchiectasis, and mucoid impaction, slightly improved on the right.   Electronically Signed   By: Elon Alas   On: 05/11/2014 04:42   Dg Hip Complete Right  05/11/2014   CLINICAL DATA:  Fall, pain.  EXAM: RIGHT HIP - COMPLETE 2+ VIEW  COMPARISON:  None.  FINDINGS: Mildly displaced right pubic symphysis fracture extending to the superior pubic ramus appears acute. Chronic appearing left superior pubic symphysis fracture. Femoral heads are well formed and located. Hip joint spaces are intact. Moderate to severe right hip osteoarthrosis. Sacroiliac joints are symmetric.  No destructive bony lesions. Included soft tissue planes are non-suspicious. Moderate vascular calcifications. Calcification in the right pelvis likely reflects involuted leiomyoma. Gluteal injection granulomas.  IMPRESSION: Acute mildly displaced right pubic symphyseal fracture extending to superior pubic ramus.  No hip fracture deformity nor  dislocation.   Electronically Signed   By: Elon Alas   On: 05/11/2014 04:39   Ct Angio Chest Pe W/cm &/or Wo Cm  05/12/2014   CLINICAL DATA:  Elevated D-dimer, hypoxia. Fall with pubic fracture.  EXAM: CT ANGIOGRAPHY CHEST WITH CONTRAST  TECHNIQUE: Multidetector CT imaging of the chest was performed using the standard protocol during bolus administration of intravenous contrast. Multiplanar CT image reconstructions and MIPs were obtained to evaluate the vascular anatomy.  CONTRAST:  158mL OMNIPAQUE IOHEXOL 300 MG/ML  SOLN  COMPARISON:  08/13/2013.  FINDINGS: Image quality in the lung bases is markedly degraded by respiratory motion, limiting evaluation for pulmonary emboli in the lower lung zones. No upper or midlung zone pulmonary embolus.  Mediastinal lymph nodes measure up to 10 mm in the AP window. Bi hilar lymph nodes measure up to 1.4 cm on the right. No axillary adenopathy. There are scattered calcified mediastinal and hilar lymph nodes. Heart is at the upper limits of normal in size. No pericardial effusion.  Patchy perihilar ground-glass with scattered bronchiectasis and peribronchovascular nodularity. Acute areas of airspace consolidation are seen in the left lower lobe. No pleural fluid. Debris is seen in the bronchus intermedius. Airway is otherwise unremarkable.  Incidental imaging of the upper abdomen shows a 9 mm low-attenuation lesion in the liver, unchanged and likely a cyst or hemangioma. Adrenal glands are unremarkable. Low-attenuation lesion in the right kidney measures 3.0 cm, similar to the prior exam but incompletely imaged on today's exam. Visualized portions of the spleen, pancreas and stomach are grossly unremarkable. No worrisome lytic or sclerotic lesions.  Review of the MIP images confirms the  above findings.  IMPRESSION: 1. Assessment for pulmonary emboli in the lower lung zones is limited by severe respiratory motion. Otherwise, no upper or mid lung zone pulmonary embolus.  2. Patchy areas of airspace consolidation in the left lower lobe are new and indicative of pneumonia. However, followup to clearing is recommended to exclude underlying malignancy. 3. 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   Electronically Signed   By: Lorin Picket M.D.   On: 05/12/2014 12:05   Dg Chest Portable 1 View  05/15/2014   CLINICAL DATA:  Productive cough.  Fever.  EXAM: PORTABLE CHEST - 1 VIEW  COMPARISON:  05/14/2014.  Chest CT, 08/13/2013.  FINDINGS: Cardiac silhouette is normal in size. No mediastinal or hilar masses.  There are ill-defined interstitial densities in the lung bases, right greater than left, and in the right mid lung. This reflects a combination of bronchiectasis, peribronchial interstitial thickening and mucus/ fluid filled bronchi. It is unchanged from the most recent prior study.  No pleural effusion or pneumothorax.  Bony thorax is demineralized but grossly intact.  IMPRESSION: 1. No change from the previous day's study. 2. Lung findings consistent with combination of bronchiectasis, peribronchial interstitial thickening and mucus/fluid filled bronchi, most evident in the lung bases, right greater than left. Although findings may all be chronic, acute bronchitis is not excluded.   Electronically Signed   By: Lajean Manes M.D.   On: 05/15/2014 14:05   Dg Chest Port 1 View  05/14/2014   CLINICAL DATA:  Followup left base consolidation  EXAM: PORTABLE CHEST - 1 VIEW  COMPARISON:  Portable chest x-ray of 05/13/2014  FINDINGS: There are prominent markings at both lung bases in this patient with emphysema suspicious for pneumonia. Small effusions cannot be excluded. Cardiomegaly is stable.  IMPRESSION: Basilar opacities consistent with atelectasis or pneumonia. Cannot exclude small effusions in patient with  emphysema.   Electronically Signed   By: Ivar Drape M.D.   On: 05/14/2014 08:03   Dg Chest Port 1 View  05/13/2014   CLINICAL DATA:  Infiltrate  EXAM: PORTABLE CHEST - 1 VIEW  COMPARISON:  Chest radiograph and chest CT May 12, 2014  FINDINGS: There is persistent airspace consolidation left lower lobe. There is widespread nodular interstitial thickening with diffuse bronchiectasis, stable. No new opacity is seen. Heart size is within normal limits. Pulmonary vascular is normal. Note that there is underlying emphysema. There is evidence of a prior fracture of the proximal right humeral metaphysis, stable.  IMPRESSION: No change from 1 day prior. Underlying emphysema with left base airspace consolidation as well as widespread bronchiectatic change and nodular interstitial thickening. These are findings that may be seen with chronic atypical pneumonitis with Mycobacterium avium complex a significant differential consideration.   Electronically Signed   By: Lowella Grip M.D.   On: 05/13/2014 07:39   Dg Chest Port 1 View  05/12/2014   CLINICAL DATA:  Pneumonia  EXAM: PORTABLE CHEST - 1 VIEW  COMPARISON:  05/11/2014  FINDINGS: Increasingly dense left basilar lung opacity. Background chronic lung disease with hyperinflation and coarse interstitial opacities in this patient with chronic indolent infection and bronchiectasis on chest CT 08/13/2013. Multiple interfaces over the apical lungs, not convincing for pneumothorax. Remote right humeral neck fracture with healed angulation.  IMPRESSION: 1. Increasingly dense left lower lobe pneumonia. 2. Background chronic lung infection (likely MAI) with bronchiectasis.   Electronically Signed  By: Jorje Guild M.D.   On: 05/12/2014 07:01   Dg Chest Portable 1 View  05/11/2014   CLINICAL DATA:  Respiratory distress, history bronchiectasis,  EXAM: PORTABLE CHEST - 1 VIEW  COMPARISON:  Portable exam 1140 hr compared earlier study of 02/04 0421 hr and correlated  with prior CT chest 08/13/2013  FINDINGS: Upper normal heart size.  Normal mediastinal contours.  Diffuse chronic reticulonodular infiltrates throughout both lungs greatest at bases.  Scattered peribronchial thickening with known bronchiectasis in lower lobes particularly RIGHT.  Bibasilar atelectasis versus infiltrate greater on RIGHT.  No pleural effusion or pneumothorax.  Bones demineralized with posttraumatic deformity of proximal RIGHT humerus.  IMPRESSION: Chronic peribronchial thickening and interstitial lung disease changes compatible with chronic bronchitis and known bronchiectasis.  Superimposed bibasilar atelectasis versus infiltrate greater on RIGHT.   Electronically Signed   By: Lavonia Dana M.D.   On: 05/11/2014 11:54     Assessment/Plan  1. Slow transit constipation Started on Senokot and Miralax  2. Anemia, unspecified follow lab  3. Hypoxia O2 dependent. Hopefully she is not retaining CO2 with the 4 L flow. Current symptoms of sleepiness and confusion could be compatible with hypercarbia.  4. Memory loss likely associated with encephalopathy and delirium  5. Diastolic dysfunction, left ventricle observe  6. Depressive disorder, not elsewhere classified observe  7. Encephalopathy Continues with confusion   8. Essential hypertension controlled  9. Gastroesophageal reflux disease without esophagitis Denies symptoms  10. Hypothyroidism, unspecified hypothyroidism type Follow lab  11. IBS (irritable bowel syndrome) No diarrhea. Constipated currently  12. BRONCHIECTASIS W/ACUTE EXACERBATION Chronic. On multiple pulmonary medications.  13. Chronic respiratory failure, unspecified whether with hypoxia or hypercapnia O2 dependent hypoxia  14. HCAP (healthcare-associated pneumonia) resolved  15. Hyponatremia -CMP, FUTURE  16. Hypokalemia -CMP, future  17. Pubic ramus fracture, right, sequela Observe. Up on feet and walking as tolerated  18. Unspecified  constipation Continue Miralax and Senokot

## 2014-05-23 ENCOUNTER — Encounter: Payer: Self-pay | Admitting: Nurse Practitioner

## 2014-05-23 ENCOUNTER — Inpatient Hospital Stay: Payer: Medicare Other | Admitting: Adult Health

## 2014-05-23 ENCOUNTER — Non-Acute Institutional Stay (SKILLED_NURSING_FACILITY): Payer: Medicare Other | Admitting: Nurse Practitioner

## 2014-05-23 DIAGNOSIS — S32591S Other specified fracture of right pubis, sequela: Secondary | ICD-10-CM

## 2014-05-23 DIAGNOSIS — I10 Essential (primary) hypertension: Secondary | ICD-10-CM

## 2014-05-23 DIAGNOSIS — IMO0002 Reserved for concepts with insufficient information to code with codable children: Secondary | ICD-10-CM

## 2014-05-23 DIAGNOSIS — K219 Gastro-esophageal reflux disease without esophagitis: Secondary | ICD-10-CM

## 2014-05-23 DIAGNOSIS — F32A Depression, unspecified: Secondary | ICD-10-CM

## 2014-05-23 DIAGNOSIS — J471 Bronchiectasis with (acute) exacerbation: Secondary | ICD-10-CM

## 2014-05-23 DIAGNOSIS — K5901 Slow transit constipation: Secondary | ICD-10-CM

## 2014-05-23 DIAGNOSIS — F329 Major depressive disorder, single episode, unspecified: Secondary | ICD-10-CM

## 2014-05-23 DIAGNOSIS — E039 Hypothyroidism, unspecified: Secondary | ICD-10-CM

## 2014-05-23 DIAGNOSIS — K5900 Constipation, unspecified: Secondary | ICD-10-CM

## 2014-05-23 DIAGNOSIS — D649 Anemia, unspecified: Secondary | ICD-10-CM

## 2014-05-23 DIAGNOSIS — R0902 Hypoxemia: Secondary | ICD-10-CM

## 2014-05-23 DIAGNOSIS — R413 Other amnesia: Secondary | ICD-10-CM

## 2014-05-23 DIAGNOSIS — F3289 Other specified depressive episodes: Secondary | ICD-10-CM

## 2014-05-23 LAB — TSH: TSH: 11.06 u[IU]/mL — AB (ref 0.41–5.90)

## 2014-05-23 NOTE — Assessment & Plan Note (Signed)
Hold narcotics to avoid oversedation  Continue zoloft and PRN xanax

## 2014-05-23 NOTE — Assessment & Plan Note (Signed)
05/22/14 16.686 05/23/14 TSH 11.055 Increase Levothyroxine 114mcg-update TSH 6 weeks.

## 2014-05-23 NOTE — Assessment & Plan Note (Signed)
No BM since Sunday(Tuesday today)-increase Senokot S II bid  and Miralax bid

## 2014-05-23 NOTE — Assessment & Plan Note (Signed)
On and off AMS since admitted to SNF Pending Memory Test. Observe the patient. F/u Pulmonology for hypoxemia. Multiple factorial: narcotic prior to hospitalization, chronic hypoxemia, acute pelvic fx, and PNA

## 2014-05-23 NOTE — Assessment & Plan Note (Signed)
No BM since Sunday(Tuesday today)-Senokot S II qhs and Miralax daily.

## 2014-05-23 NOTE — Assessment & Plan Note (Signed)
Sat O2 80s %. Associated with AMS--will f/u Pulmonology ASAP. Maintained on Brovana bid, Budesonide bid, and Prednisone 10mg 

## 2014-05-23 NOTE — Assessment & Plan Note (Signed)
Hx HTN  Hx TIA  D/c plan --  Cont plvix, bisoprolol, sular

## 2014-05-23 NOTE — Assessment & Plan Note (Signed)
Hgb 11.4. 05/15/14

## 2014-05-23 NOTE — Assessment & Plan Note (Signed)
PT  Ultram for pain  The right

## 2014-05-23 NOTE — Assessment & Plan Note (Signed)
Stable.  Continue Pepcid 20mg daily

## 2014-05-23 NOTE — Progress Notes (Signed)
Patient ID: [name], female   DOB: February 27, 1933, 80 y.o.   MRN: [id number]   Code Status: Full Code  Allergies  Allergen Reactions  . Chocolate Cough  . Codeine     Unknown reaction  . Erythromycin Ethylsuccinate     REACTION: unspecified  . Iodine   . Iohexol      Desc: pt states allergy many years ago--unable to remember type of allergy--pre med in future slg 06/13/08   . Sulfamethoxazole     REACTION: unspecified  . Septra [Bactrim] Itching and Rash    Chief Complaint  Patient presents with  . Medical Management of Chronic Issues  . Acute Visit    hypoxemia, AMS, elevated TSH 16.686 05/12/14    HPI: Patient is a 80 y.o. female seen in the SNF at [facility] today for evaluation of hypoxemia, AMS, elevated TSH, recent hospitalization for HCAP, Rt pubic ramus fx after fall (non-operative),  and other chronic medical conditions.    Hospitalized 05/11/2014-05/16/2014 for HCAP , R pubic ramus fx-PRN tramadol , resolved acute encephalopathy 2nd to medications and respiratory failure-Respiratory failurerespiratory improved with narcan, bipap, nebs, IV abx and solumedrol and required no further bipap. She received no further narcotics and pain has been well   Problem List Items Addressed This Visit   Anemia, unspecified     Hgb 11.4. 05/15/14    BRONCHIECTASIS W/ACUTE EXACERBATION     O2 desaturation in 80s %, takes  Brovana bid, Budesonide bid, and Prednisone 24m      Constipation     No BM since Sunday(Tuesday today)-increase Senokot S II bid  and Miralax bid    Depression     Hold narcotics to avoid oversedation  Continue zoloft and PRN xanax       GERD     Stable. Continue Pepcid 268mdaily.      HTN (hypertension)     Hx HTN  Hx TIA  D/c plan --  Cont plvix, bisoprolol, sular       Hypothyroidism     05/22/14 16.686 05/23/14 TSH 11.055 Increase Levothyroxine 12595mupdate TSH 6 weeks.      Hypoxia - Primary     Sat O2 80s %. Associated with  AMS--will f/u Pulmonology ASAP. Maintained on Brovana bid, Budesonide bid, and Prednisone 64m18m   Memory deficit     On and off AMS since admitted to SNF Pending Memory Test. Observe the patient. F/u Pulmonology for hypoxemia. Multiple factorial: narcotic prior to hospitalization, chronic hypoxemia, acute pelvic fx, and PNA     Pubic ramus fracture     PT  Ultram for pain  The right      Unspecified constipation     No BM since Sunday(Tuesday today)-Senokot S II qhs and Miralax daily.         Review of Systems:  Review of Systems  Constitutional: Positive for malaise/fatigue. Negative for fever, chills, weight loss and diaphoresis.       Generalized.   HENT: Positive for hearing loss. Negative for congestion, ear discharge, ear pain, nosebleeds, sore throat and tinnitus.   Eyes: Negative for blurred vision, double vision, photophobia, pain, discharge and redness.  Respiratory: Positive for cough and shortness of breath. Negative for hemoptysis, sputum production, wheezing and stridor.   Cardiovascular: Positive for PND. Negative for chest pain, palpitations, orthopnea, claudication and leg swelling.  Gastrointestinal: Positive for constipation. Negative for heartburn, nausea, vomiting, abdominal pain, diarrhea, blood in stool and melena.  Genitourinary:  Negative for dysuria, urgency, frequency, hematuria and flank pain.  Musculoskeletal: Positive for falls and joint pain. Negative for back pain, myalgias and neck pain.       Right groin  Skin: Negative for itching and rash.  Neurological: Positive for weakness. Negative for dizziness, tingling, tremors, sensory change, speech change, focal weakness, seizures, loss of consciousness and headaches.  Endo/Heme/Allergies: Negative for environmental allergies and polydipsia. Does not bruise/bleed easily.  Psychiatric/Behavioral: Positive for memory loss. Negative for depression, suicidal ideas, hallucinations and substance abuse. The  patient is not nervous/anxious and does not have insomnia.      Past Medical History  Diagnosis Date  . Bronchiectasis with acute exacerbation 05/16/14    acute respiratory failure   . Other and unspecified hyperlipidemia   . TIA (transient ischemic attack)   . Osteoporosis, unspecified   . Asthma   . Esophageal reflux   . IBS (irritable bowel syndrome)   . Diverticulosis   . Hemorrhoids   . Hypothyroidism   . Schatzki's ring   . Hiatal hernia   . Tubular adenoma of colon 12/2008  . Chronic respiratory failure 05/16/14  . HCAP (healthcare-associated pneumonia) 05/16/14  . Sinusitis   . GERD (gastroesophageal reflux disease)   . HTN (hypertension)   . Encephalopathy 05/16/14    noted during hospitalization  . Depressive disorder, not elsewhere classified   . Diastolic dysfunction, left ventricle 05/16/14  . Memory loss   . Pubic ramus fracture   . Hiatal hernia   . Tubular adenoma of colon   . Hypoxia   . Anemia, unspecified   . Constipation    Past Surgical History  Procedure Laterality Date  . Cholecystectomy  1995  . Nasal sinus surgery    . Tonsillectomy    . Foot surgery    . Small intestine surgery  05/2008   Social History:   reports that she has never smoked. She has never used smokeless tobacco. She reports that she does not drink alcohol or use illicit drugs.  Family History  Problem Relation Age of Onset  . Hypertension Sister   . Heart disease Mother   . Heart disease Father   . Stroke Father   . Multiple myeloma Maternal Aunt   . Colon cancer Mother     Medications: Patient's Medications  New Prescriptions   No medications on file  Previous Medications   ALBUTEROL (PROVENTIL) (2.5 MG/3ML) 0.083% NEBULIZER SOLUTION    Take 2.5 mg by nebulization every 3 (three) hours as needed for wheezing or shortness of breath.    ALPRAZOLAM (XANAX) 0.25 MG TABLET    Take 0.25 mg by mouth 2 (two) times daily as needed for anxiety.    ARFORMOTEROL (BROVANA) 15  MCG/2ML NEBU    Take 2 mLs (15 mcg total) by nebulization every 12 (twelve) hours.   AZELASTINE-FLUTICASONE (DYMISTA) 137-50 MCG/ACT SUSP    Place 1 spray into the nose 2 (two) times daily as needed. For seasonal allergies   BISOPROLOL (ZEBETA) 5 MG TABLET    Take 2.5 mg by mouth daily.   BUDESONIDE (PULMICORT) 0.5 MG/2ML NEBULIZER SOLUTION    Take 2 mLs (0.5 mg total) by nebulization 2 (two) times daily.   CALCIUM-VITAMIN D (OSCAL WITH D) 500-200 MG-UNIT PER TABLET    Take 1 tablet by mouth 2 (two) times daily.     CETIRIZINE (ZYRTEC) 10 MG TABLET    Take 10 mg by mouth at bedtime.   CICLESONIDE (OMNARIS) 50 MCG/ACT NASAL SPRAY  Place 2 sprays into both nostrils 2 (two) times daily.   CLOPIDOGREL (PLAVIX) 75 MG TABLET    Take 75 mg by mouth daily.   DOXYCYCLINE (VIBRA-TABS) 100 MG TABLET       FAMOTIDINE (PEPCID) 20 MG TABLET    Take 20 mg by mouth at bedtime.    GUAIFENESIN (MUCINEX) 600 MG 12 HR TABLET    Take 600 mg by mouth 2 (two) times daily as needed for cough.    LANSOPRAZOLE (PREVACID) 15 MG CAPSULE       LATANOPROST (XALATAN) 0.005 % OPHTHALMIC SOLUTION    Place 1 drop into both eyes at bedtime.    LEVOFLOXACIN (LEVAQUIN) 750 MG TABLET    1 tab po daily x 3 days   LEVOTHYROXINE (SYNTHROID, LEVOTHROID) 100 MCG TABLET    Take 100 mcg by mouth daily before breakfast.   NISOLDIPINE (SULAR) 8.5 MG 24 HR TABLET    Take 8.5 mg by mouth daily.   PERFOROMIST 20 MCG/2ML NEBULIZER SOLUTION       POTASSIUM CHLORIDE SA (K-DUR,KLOR-CON) 20 MEQ TABLET       PREDNISONE (DELTASONE) 10 MG TABLET    Take 10 mg by mouth daily with breakfast.   PREDNISONE (DELTASONE) 20 MG TABLET    87m po daily x 4 days then back to 122mpo daily maintenance   PROBIOTIC PRODUCT (PROBIOTIC DAILY) CAPS    Take 1 capsule by mouth every morning.   PSEUDOEPHEDRINE-IBUPROFEN (ADVIL COLD/SINUS PO)    Take 1 tablet by mouth daily as needed (sinus pressure).   QVAR 80 MCG/ACT INHALER       SERTRALINE (ZOLOFT) 25 MG TABLET     Take 25 mg by mouth daily.   TRAMADOL (ULTRAM) 50 MG TABLET    Take 1 tablet (50 mg total) by mouth every 12 (twelve) hours as needed for moderate pain.  Modified Medications   No medications on file  Discontinued Medications   No medications on file     Physical Exam: Physical Exam  Constitutional: She appears well-developed and well-nourished. No distress.  HENT:  Head: Normocephalic and atraumatic.  Right Ear: External ear normal.  Left Ear: External ear normal.  Nose: Nose normal.  Mouth/Throat: Oropharynx is clear and moist. No oropharyngeal exudate.  Eyes: Conjunctivae and EOM are normal. Pupils are equal, round, and reactive to light. Right eye exhibits no discharge. Left eye exhibits no discharge. No scleral icterus.  Neck: Normal range of motion. Neck supple. No JVD present. No tracheal deviation present. No thyromegaly present.  Cardiovascular: Normal rate, regular rhythm, normal heart sounds and intact distal pulses.   No murmur heard. Pulmonary/Chest: No stridor. No respiratory distress. She has no wheezes. She has rales. She exhibits no tenderness.  O2 dependent. accessory muscle use.   Abdominal: She exhibits no distension and no mass. There is no tenderness. There is no rebound and no guarding.  Musculoskeletal: Normal range of motion. She exhibits tenderness. She exhibits no edema.  In right groin and buttock.   Lymphadenopathy:    She has no cervical adenopathy.  Neurological: She is alert. She has normal reflexes. No cranial nerve deficit. She exhibits normal muscle tone. Coordination normal.  Skin: Skin is warm and dry. No rash noted. She is not diaphoretic. No erythema. No pallor.  Psychiatric: She has a normal mood and affect. Her behavior is normal. Thought content normal.    Filed Vitals:   05/23/14 1222  BP: 160/86  Pulse: 76  Temp: 97.2 F (  36.2 C)  TempSrc: Tympanic  Resp: 22      Labs reviewed: Basic Metabolic Panel:  Recent Labs   05/13/14 0249 05/14/14 0352 05/15/14 0433 05/22/14 05/23/14  NA 138 137 133*  --   --   K 4.2 4.5 4.5  --   --   CL 100 98 93*  --   --   CO2 _0 --   --   GLUCOSE 139* 94 92  --   --   BUN _1 --   --   CREATININE 0.70 0.63 0.55  --   --   CALCIUM 8.3* 8.5 9.1  --   --   TSH  --   --   --  16.69* 11.06*   Liver Function Tests:  Recent Labs  06/12/13 0924 05/11/14 1122 05/13/14 0249  AST 21 40* 30  ALT 14 35 26  ALKPHOS 53 74 52  BILITOT 0.4 0.2* 0.2*  PROT 7.2 7.8 6.3  ALBUMIN 3.5 3.5 2.6*   No results found for this basename: LIPASE, AMYLASE,  in the last 8760 hours No results found for this basename: AMMONIA,  in the last 8760 hours CBC:  Recent Labs  05/11/14 1122  05/13/14 0249 05/14/14 0352 05/15/14 0433  WBC 29.5*  < > 17.9* 19.1* 14.3*  NEUTROABS 23.9*  --  17.3* 16.2*  --   HGB 12.7  < > 10.3* 10.3* 11.4*  HCT 42.3  < > 33.8* 34.1* 37.1  MCV 83.6  < > 80.3 80.4 78.1  PLT 372  < > 226 261 248  < > = values in this interval not displayed. Lipid Panel:  Recent Labs  06/12/13 0924  CHOL 181  HDL 75.00  LDLCALC 86  TRIG 98.0  CHOLHDL 2    Past Procedures:  8/16 Dopper legs >> no DVT  8/17 Echo >> mod/severe LVH, EF 55 to 16%, grade 1 diastolic dysfx  1/09 CT chest >> borderline LAN with calcifications, patchy GGO with BTX and peribronchovascular nodularity, consolidation LLL   Assessment/Plan Hypothyroidism 05/22/14 16.686 05/23/14 TSH 11.055 Increase Levothyroxine 146mg-update TSH 6 weeks.    Hypoxia Sat O2 80s %. Associated with AMS--will f/u Pulmonology ASAP. Maintained on Brovana bid, Budesonide bid, and Prednisone 165m   Unspecified constipation No BM since Sunday(Tuesday today)-Senokot S II qhs and Miralax daily.    Pubic ramus fracture PT  Ultram for pain  The right    Anemia, unspecified Hgb 11.4. 05/15/14  Depression Hold narcotics to avoid oversedation  Continue zoloft and PRN xanax      GERD Stable. Continue Pepcid 202maily.    Memory deficit On and off AMS since admitted to SNF Pending Memory Test. Observe the patient. F/u Pulmonology for hypoxemia. Multiple factorial: narcotic prior to hospitalization, chronic hypoxemia, acute pelvic fx, and PNA   HTN (hypertension) Hx HTN  Hx TIA  D/c plan --  Cont plvix, bisoprolol, sular     BRONCHIECTASIS W/ACUTE EXACERBATION O2 desaturation in 80s %, takes  Brovana bid, Budesonide bid, and Prednisone 4m67m Constipation No BM since Sunday(Tuesday today)-increase Senokot S II bid  and Miralax bid    Family/ Staff Communication: observe the patient. F/u Pulmonology.   Goals of Care: SNF  Labs/tests ordered: TSH 6 weeks.

## 2014-05-23 NOTE — Assessment & Plan Note (Signed)
O2 desaturation in 80s %, takes  Brovana bid, Budesonide bid, and Prednisone 10mg 

## 2014-05-26 LAB — BASIC METABOLIC PANEL
BUN: 13 mg/dL (ref 4–21)
Creatinine: 0.8 mg/dL (ref 0.5–1.1)
GLUCOSE: 104 mg/dL
POTASSIUM: 4 mmol/L (ref 3.4–5.3)
Sodium: 126 mmol/L — AB (ref 137–147)

## 2014-05-26 LAB — HEPATIC FUNCTION PANEL
ALT: 13 U/L (ref 7–35)
AST: 20 U/L (ref 13–35)
Alkaline Phosphatase: 203 U/L — AB (ref 25–125)

## 2014-05-26 LAB — CBC AND DIFFERENTIAL
HEMATOCRIT: 32 % — AB (ref 36–46)
Hemoglobin: 10.9 g/dL — AB (ref 12.0–16.0)
Platelets: 575 10*3/uL — AB (ref 150–399)
WBC: 17 10*3/mL

## 2014-05-27 ENCOUNTER — Ambulatory Visit (INDEPENDENT_AMBULATORY_CARE_PROVIDER_SITE_OTHER)
Admission: RE | Admit: 2014-05-27 | Discharge: 2014-05-27 | Disposition: A | Discharge status: Home / Self Care | Payer: Medicare Other | Source: Ambulatory Visit | Attending: Adult Health | Admitting: Adult Health

## 2014-05-27 ENCOUNTER — Other Ambulatory Visit: Payer: Self-pay | Admitting: Nurse Practitioner

## 2014-05-27 ENCOUNTER — Inpatient Hospital Stay (HOSPITAL_COMMUNITY)
Admission: EM | Admit: 2014-05-27 | Discharge: 2014-05-30 | DRG: 193 | Disposition: A | Discharge status: Skilled Nursing Facility | Payer: Medicare Other | Attending: Internal Medicine | Admitting: Internal Medicine

## 2014-05-27 ENCOUNTER — Ambulatory Visit (INDEPENDENT_AMBULATORY_CARE_PROVIDER_SITE_OTHER): Payer: Medicare Other | Admitting: Adult Health

## 2014-05-27 ENCOUNTER — Emergency Department (HOSPITAL_COMMUNITY): Payer: Medicare Other

## 2014-05-27 ENCOUNTER — Encounter (HOSPITAL_COMMUNITY): Payer: Self-pay | Admitting: Emergency Medicine

## 2014-05-27 ENCOUNTER — Encounter: Payer: Self-pay | Admitting: Adult Health

## 2014-05-27 VITALS — BP 122/76 | HR 80 | Temp 97.7°F

## 2014-05-27 DIAGNOSIS — J9620 Acute and chronic respiratory failure, unspecified whether with hypoxia or hypercapnia: Secondary | ICD-10-CM | POA: Diagnosis present

## 2014-05-27 DIAGNOSIS — I5032 Chronic diastolic (congestive) heart failure: Secondary | ICD-10-CM | POA: Diagnosis present

## 2014-05-27 DIAGNOSIS — K219 Gastro-esophageal reflux disease without esophagitis: Secondary | ICD-10-CM

## 2014-05-27 DIAGNOSIS — M810 Age-related osteoporosis without current pathological fracture: Secondary | ICD-10-CM

## 2014-05-27 DIAGNOSIS — J471 Bronchiectasis with (acute) exacerbation: Secondary | ICD-10-CM

## 2014-05-27 DIAGNOSIS — E039 Hypothyroidism, unspecified: Secondary | ICD-10-CM

## 2014-05-27 DIAGNOSIS — I509 Heart failure, unspecified: Secondary | ICD-10-CM | POA: Diagnosis present

## 2014-05-27 DIAGNOSIS — E871 Hypo-osmolality and hyponatremia: Secondary | ICD-10-CM

## 2014-05-27 DIAGNOSIS — J189 Pneumonia, unspecified organism: Principal | ICD-10-CM

## 2014-05-27 DIAGNOSIS — K589 Irritable bowel syndrome without diarrhea: Secondary | ICD-10-CM

## 2014-05-27 DIAGNOSIS — J9611 Chronic respiratory failure with hypoxia: Secondary | ICD-10-CM

## 2014-05-27 DIAGNOSIS — G459 Transient cerebral ischemic attack, unspecified: Secondary | ICD-10-CM

## 2014-05-27 DIAGNOSIS — Z8673 Personal history of transient ischemic attack (TIA), and cerebral infarction without residual deficits: Secondary | ICD-10-CM | POA: Diagnosis not present

## 2014-05-27 DIAGNOSIS — F0390 Unspecified dementia without behavioral disturbance: Secondary | ICD-10-CM | POA: Diagnosis present

## 2014-05-27 DIAGNOSIS — J9610 Chronic respiratory failure, unspecified whether with hypoxia or hypercapnia: Secondary | ICD-10-CM

## 2014-05-27 DIAGNOSIS — E46 Unspecified protein-calorie malnutrition: Secondary | ICD-10-CM | POA: Diagnosis present

## 2014-05-27 DIAGNOSIS — R143 Flatulence: Secondary | ICD-10-CM

## 2014-05-27 DIAGNOSIS — F329 Major depressive disorder, single episode, unspecified: Secondary | ICD-10-CM

## 2014-05-27 DIAGNOSIS — J45909 Unspecified asthma, uncomplicated: Secondary | ICD-10-CM | POA: Diagnosis present

## 2014-05-27 DIAGNOSIS — R5383 Other fatigue: Secondary | ICD-10-CM

## 2014-05-27 DIAGNOSIS — J9600 Acute respiratory failure, unspecified whether with hypoxia or hypercapnia: Secondary | ICD-10-CM

## 2014-05-27 DIAGNOSIS — K5900 Constipation, unspecified: Secondary | ICD-10-CM

## 2014-05-27 DIAGNOSIS — G9340 Encephalopathy, unspecified: Secondary | ICD-10-CM | POA: Diagnosis present

## 2014-05-27 DIAGNOSIS — I5189 Other ill-defined heart diseases: Secondary | ICD-10-CM

## 2014-05-27 DIAGNOSIS — R413 Other amnesia: Secondary | ICD-10-CM

## 2014-05-27 DIAGNOSIS — R141 Gas pain: Secondary | ICD-10-CM

## 2014-05-27 DIAGNOSIS — Z0000 Encounter for general adult medical examination without abnormal findings: Secondary | ICD-10-CM

## 2014-05-27 DIAGNOSIS — E785 Hyperlipidemia, unspecified: Secondary | ICD-10-CM

## 2014-05-27 DIAGNOSIS — E43 Unspecified severe protein-calorie malnutrition: Secondary | ICD-10-CM

## 2014-05-27 DIAGNOSIS — D649 Anemia, unspecified: Secondary | ICD-10-CM

## 2014-05-27 DIAGNOSIS — Z8719 Personal history of other diseases of the digestive system: Secondary | ICD-10-CM

## 2014-05-27 DIAGNOSIS — I10 Essential (primary) hypertension: Secondary | ICD-10-CM

## 2014-05-27 DIAGNOSIS — R5381 Other malaise: Secondary | ICD-10-CM

## 2014-05-27 DIAGNOSIS — E876 Hypokalemia: Secondary | ICD-10-CM

## 2014-05-27 DIAGNOSIS — D72829 Elevated white blood cell count, unspecified: Secondary | ICD-10-CM

## 2014-05-27 DIAGNOSIS — J310 Chronic rhinitis: Secondary | ICD-10-CM

## 2014-05-27 DIAGNOSIS — M2550 Pain in unspecified joint: Secondary | ICD-10-CM

## 2014-05-27 DIAGNOSIS — K5730 Diverticulosis of large intestine without perforation or abscess without bleeding: Secondary | ICD-10-CM

## 2014-05-27 DIAGNOSIS — R142 Eructation: Secondary | ICD-10-CM

## 2014-05-27 DIAGNOSIS — F3289 Other specified depressive episodes: Secondary | ICD-10-CM

## 2014-05-27 DIAGNOSIS — J9601 Acute respiratory failure with hypoxia: Secondary | ICD-10-CM

## 2014-05-27 DIAGNOSIS — I519 Heart disease, unspecified: Secondary | ICD-10-CM

## 2014-05-27 DIAGNOSIS — F32A Depression, unspecified: Secondary | ICD-10-CM

## 2014-05-27 DIAGNOSIS — R0902 Hypoxemia: Secondary | ICD-10-CM

## 2014-05-27 DIAGNOSIS — M8900 Algoneurodystrophy, unspecified site: Secondary | ICD-10-CM

## 2014-05-27 LAB — CBC
HCT: 39.3 % (ref 36.0–46.0)
Hemoglobin: 12.5 g/dL (ref 12.0–15.0)
MCH: 25.2 pg — ABNORMAL LOW (ref 26.0–34.0)
MCHC: 31.8 g/dL (ref 30.0–36.0)
MCV: 79.2 fL (ref 78.0–100.0)
PLATELETS: 596 10*3/uL — AB (ref 150–400)
RBC: 4.96 MIL/uL (ref 3.87–5.11)
RDW: 16.8 % — ABNORMAL HIGH (ref 11.5–15.5)
WBC: 22.7 10*3/uL — AB (ref 4.0–10.5)

## 2014-05-27 LAB — COMPREHENSIVE METABOLIC PANEL
ALT: 17 U/L (ref 0–35)
AST: 29 U/L (ref 0–37)
Albumin: 3.3 g/dL — ABNORMAL LOW (ref 3.5–5.2)
Alkaline Phosphatase: 304 U/L — ABNORMAL HIGH (ref 39–117)
Anion gap: 15 (ref 5–15)
BILIRUBIN TOTAL: 0.5 mg/dL (ref 0.3–1.2)
BUN: 16 mg/dL (ref 6–23)
CHLORIDE: 89 meq/L — AB (ref 96–112)
CO2: 25 mEq/L (ref 19–32)
Calcium: 9.1 mg/dL (ref 8.4–10.5)
Creatinine, Ser: 0.75 mg/dL (ref 0.50–1.10)
GFR calc Af Amer: >90 mL/min (ref >90)
GFR, EST NON AFRICAN AMERICAN: 79 mL/min — AB (ref >90)
Glucose, Bld: 125 mg/dL — ABNORMAL HIGH (ref 70–99)
Potassium: 4.2 mEq/L (ref 3.7–5.3)
SODIUM: 129 meq/L — AB (ref 137–147)
Total Protein: 7.4 g/dL (ref 6.0–8.3)

## 2014-05-27 LAB — I-STAT ARTERIAL BLOOD GAS, ED
ACID-BASE EXCESS: 4 mmol/L — AB (ref 0.0–2.0)
Bicarbonate: 29.5 mEq/L — ABNORMAL HIGH (ref 20.0–24.0)
O2 Saturation: 84 %
TCO2: 31 mmol/L (ref 0–100)
pCO2 arterial: 46.4 mmHg — ABNORMAL HIGH (ref 35.0–45.0)
pH, Arterial: 7.411 (ref 7.350–7.450)
pO2, Arterial: 49 mmHg — ABNORMAL LOW (ref 80.0–100.0)

## 2014-05-27 LAB — URINALYSIS, ROUTINE W REFLEX MICROSCOPIC
Bilirubin Urine: NEGATIVE
Glucose, UA: NEGATIVE mg/dL
Hgb urine dipstick: NEGATIVE
KETONES UR: NEGATIVE mg/dL
LEUKOCYTES UA: NEGATIVE
Nitrite: NEGATIVE
PH: 7 (ref 5.0–8.0)
Protein, ur: NEGATIVE mg/dL
SPECIFIC GRAVITY, URINE: 1.014 (ref 1.005–1.030)
Urobilinogen, UA: 0.2 mg/dL (ref 0.0–1.0)

## 2014-05-27 LAB — PHOSPHORUS: Phosphorus: 3.4 mg/dL (ref 2.3–4.6)

## 2014-05-27 LAB — PROTIME-INR
INR: 0.96 (ref 0.00–1.49)
PROTHROMBIN TIME: 12.8 s (ref 11.6–15.2)

## 2014-05-27 LAB — I-STAT TROPONIN, ED: Troponin i, poc: 0 ng/mL (ref 0.00–0.08)

## 2014-05-27 LAB — APTT: aPTT: 30 seconds (ref 24–37)

## 2014-05-27 LAB — CBG MONITORING, ED: Glucose-Capillary: 123 mg/dL — ABNORMAL HIGH (ref 70–99)

## 2014-05-27 LAB — I-STAT CG4 LACTIC ACID, ED: LACTIC ACID, VENOUS: 1.08 mmol/L (ref 0.5–2.2)

## 2014-05-27 LAB — MAGNESIUM: Magnesium: 2.3 mg/dL (ref 1.5–2.5)

## 2014-05-27 LAB — PRO B NATRIURETIC PEPTIDE: Pro B Natriuretic peptide (BNP): 528.2 pg/mL — ABNORMAL HIGH (ref 0–450)

## 2014-05-27 MED ORDER — IPRATROPIUM BROMIDE 0.02 % IN SOLN
0.5000 mg | Freq: Four times a day (QID) | RESPIRATORY_TRACT | Status: DC
  Administered 2014-05-27: 0.5 mg via RESPIRATORY_TRACT
  Filled 2014-05-27: qty 2.5

## 2014-05-27 MED ORDER — VANCOMYCIN HCL IN DEXTROSE 1-5 GM/200ML-% IV SOLN
1000.0000 mg | Freq: Once | INTRAVENOUS | Status: AC
  Administered 2014-05-27: 1000 mg via INTRAVENOUS
  Filled 2014-05-27: qty 200

## 2014-05-27 MED ORDER — SODIUM CHLORIDE 0.9 % IV SOLN
INTRAVENOUS | Status: DC
  Administered 2014-05-27 – 2014-05-28 (×2): via INTRAVENOUS
  Administered 2014-05-30: 75 mL/h via INTRAVENOUS

## 2014-05-27 MED ORDER — DEXTROSE 5 % IV SOLN
1.0000 g | Freq: Three times a day (TID) | INTRAVENOUS | Status: DC
  Administered 2014-05-27 – 2014-05-28 (×2): 1 g via INTRAVENOUS
  Filled 2014-05-27 (×4): qty 1

## 2014-05-27 MED ORDER — SODIUM CHLORIDE 0.9 % IV SOLN
INTRAVENOUS | Status: DC
  Administered 2014-05-27: 15:00:00 via INTRAVENOUS

## 2014-05-27 MED ORDER — ALBUTEROL SULFATE (2.5 MG/3ML) 0.083% IN NEBU
2.5000 mg | INHALATION_SOLUTION | Freq: Four times a day (QID) | RESPIRATORY_TRACT | Status: DC
  Administered 2014-05-27: 2.5 mg via RESPIRATORY_TRACT
  Filled 2014-05-27: qty 3

## 2014-05-27 MED ORDER — DEXTROSE 5 % IV SOLN
1.0000 g | Freq: Once | INTRAVENOUS | Status: AC
  Administered 2014-05-27: 1 g via INTRAVENOUS
  Filled 2014-05-27: qty 1

## 2014-05-27 MED ORDER — BISOPROLOL FUMARATE 5 MG PO TABS
2.5000 mg | ORAL_TABLET | Freq: Every day | ORAL | Status: DC
  Administered 2014-05-27 – 2014-05-30 (×4): 2.5 mg via ORAL
  Filled 2014-05-27 (×4): qty 0.5

## 2014-05-27 MED ORDER — IPRATROPIUM-ALBUTEROL 0.5-2.5 (3) MG/3ML IN SOLN
3.0000 mL | Freq: Once | RESPIRATORY_TRACT | Status: AC
  Administered 2014-05-27: 3 mL via RESPIRATORY_TRACT
  Filled 2014-05-27: qty 3

## 2014-05-27 MED ORDER — CLOPIDOGREL BISULFATE 75 MG PO TABS
75.0000 mg | ORAL_TABLET | Freq: Every day | ORAL | Status: DC
  Administered 2014-05-27 – 2014-05-30 (×4): 75 mg via ORAL
  Filled 2014-05-27 (×4): qty 1

## 2014-05-27 MED ORDER — POLYETHYLENE GLYCOL 3350 17 GM/SCOOP PO POWD
17.0000 g | Freq: Every day | ORAL | Status: DC
  Filled 2014-05-27: qty 255

## 2014-05-27 MED ORDER — LEVOTHYROXINE SODIUM 125 MCG PO TABS
125.0000 ug | ORAL_TABLET | Freq: Every day | ORAL | Status: DC
  Administered 2014-05-28 – 2014-05-30 (×3): 125 ug via ORAL
  Filled 2014-05-27 (×4): qty 1

## 2014-05-27 MED ORDER — GUAIFENESIN-DM 100-10 MG/5ML PO SYRP
5.0000 mL | ORAL_SOLUTION | ORAL | Status: DC | PRN
  Filled 2014-05-27: qty 5

## 2014-05-27 MED ORDER — VANCOMYCIN HCL 500 MG IV SOLR
500.0000 mg | Freq: Two times a day (BID) | INTRAVENOUS | Status: DC
  Administered 2014-05-28 – 2014-05-30 (×5): 500 mg via INTRAVENOUS
  Filled 2014-05-27 (×7): qty 500

## 2014-05-27 MED ORDER — HEPARIN SODIUM (PORCINE) 5000 UNIT/ML IJ SOLN
5000.0000 [IU] | Freq: Three times a day (TID) | INTRAMUSCULAR | Status: DC
  Administered 2014-05-27 – 2014-05-30 (×8): 5000 [IU] via SUBCUTANEOUS
  Filled 2014-05-27 (×10): qty 1

## 2014-05-27 MED ORDER — GUAIFENESIN ER 600 MG PO TB12
1200.0000 mg | ORAL_TABLET | Freq: Two times a day (BID) | ORAL | Status: DC
  Administered 2014-05-27 – 2014-05-30 (×6): 1200 mg via ORAL
  Filled 2014-05-27 (×7): qty 2

## 2014-05-27 MED ORDER — PANTOPRAZOLE SODIUM 40 MG PO TBEC
40.0000 mg | DELAYED_RELEASE_TABLET | Freq: Every day | ORAL | Status: DC
  Administered 2014-05-27 – 2014-05-30 (×4): 40 mg via ORAL
  Filled 2014-05-27 (×4): qty 1

## 2014-05-27 MED ORDER — POLYETHYLENE GLYCOL 3350 17 G PO PACK
17.0000 g | PACK | Freq: Every day | ORAL | Status: DC
  Administered 2014-05-27 – 2014-05-30 (×3): 17 g via ORAL
  Filled 2014-05-27 (×4): qty 1

## 2014-05-27 MED ORDER — METHYLPREDNISOLONE SODIUM SUCC 125 MG IJ SOLR
60.0000 mg | Freq: Three times a day (TID) | INTRAMUSCULAR | Status: DC
  Administered 2014-05-27 – 2014-05-28 (×2): 60 mg via INTRAVENOUS
  Filled 2014-05-27 (×5): qty 0.96

## 2014-05-27 NOTE — ED Notes (Signed)
EKG completed and given to EDP.  

## 2014-05-27 NOTE — H&P (Signed)
[facility] History and Physical  [name] MBT:[id number] DOB: [birth year] DOA: 05/27/2014  Referring physician: Charlesetta Shanks, MD PCP: Christinia Gully, MD   Chief Complaint: SOB/altered mental status  HPI: [name] is a 80 y.o. female with past medical history of bronchiectasis, hypothyroidism and TIA. Patient sent from Dr. Melvyn Novas office because of SOB and altered mental status. Patient is confused so most of the history obtained from the chart, apparently patient lives in assisted living facility, she was not feeling well for the past several days so she evaluated today in her primary pulmonologist office, she was found to be hypoxic, confused and so she was sent to the ER for further evaluation. In the ED chest x-ray showed no acute changes with coarse interstitial lung markings in the mid and lower lung zones bilaterally. WBC 22.7, sodium of 129, proBNP 528 and lactic acid of 1. Patient admitted to the hospital for further evaluation.   Review of Systems:  Constitutional: negative for anorexia, fevers and sweats Eyes: negative for irritation, redness and visual disturbance Ears, nose, mouth, throat, and face: negative for earaches, epistaxis, nasal congestion and sore throat Respiratory: negative for cough, dyspnea on exertion, sputum and wheezing Cardiovascular: negative for chest pain, dyspnea, lower extremity edema, orthopnea, palpitations and syncope Gastrointestinal: negative for abdominal pain, constipation, diarrhea, melena, nausea and vomiting Genitourinary:negative for dysuria, frequency and hematuria Hematologic/lymphatic: negative for bleeding, easy bruising and lymphadenopathy Musculoskeletal:negative for arthralgias, muscle weakness and stiff joints Neurological: negative for coordination problems, gait problems, headaches and weakness Endocrine: negative for diabetic symptoms including polydipsia, polyuria and weight loss Allergic/Immunologic: negative for  anaphylaxis, hay fever and urticaria  Past Medical History  Diagnosis Date  . Bronchiectasis with acute exacerbation 05/16/14    acute respiratory failure   . Other and unspecified hyperlipidemia   . TIA (transient ischemic attack)   . Osteoporosis, unspecified   . Asthma   . Esophageal reflux   . IBS (irritable bowel syndrome)   . Diverticulosis   . Hemorrhoids   . Hypothyroidism   . Schatzki's ring   . Hiatal hernia   . Tubular adenoma of colon 12/2008  . Chronic respiratory failure 05/16/14  . HCAP (healthcare-associated pneumonia) 05/16/14  . Sinusitis   . GERD (gastroesophageal reflux disease)   . HTN (hypertension)   . Encephalopathy 05/16/14    noted during hospitalization  . Depressive disorder, not elsewhere classified   . Diastolic dysfunction, left ventricle 05/16/14  . Memory loss   . Pubic ramus fracture   . Hiatal hernia   . Tubular adenoma of colon   . Hypoxia   . Anemia, unspecified   . Constipation    Past Surgical History  Procedure Laterality Date  . Cholecystectomy  1995  . Nasal sinus surgery    . Tonsillectomy    . Foot surgery    . Small intestine surgery  05/2008   Social History:   reports that she has never smoked. She has never used smokeless tobacco. She reports that she does not drink alcohol or use illicit drugs.  Allergies  Allergen Reactions  . Chocolate Cough  . Codeine     Unknown reaction  . Erythromycin Ethylsuccinate     REACTION: unspecified  . Iodine   . Iohexol      Desc: pt states allergy many years ago--unable to remember type of allergy--pre med in future slg 06/13/08   . Sulfamethoxazole     REACTION: unspecified  . Septra [Bactrim] Itching and Rash  Family History  Problem Relation Age of Onset  . Hypertension Sister   . Heart disease Mother   . Heart disease Father   . Stroke Father   . Multiple myeloma Maternal Aunt   . Colon cancer Mother      Prior to Admission medications   Medication Sig Start Date  End Date Taking? Authorizing Provider  arformoterol (BROVANA) 15 MCG/2ML NEBU Take 15 mcg by nebulization every 12 (twelve) hours.   Yes Historical Provider, MD  budesonide (PULMICORT) 0.5 MG/2ML nebulizer solution Take 0.5 mg by nebulization 2 (two) times daily.   Yes Historical Provider, MD  traMADol (ULTRAM) 50 MG tablet Take 50 mg by mouth every 12 (twelve) hours as needed for moderate pain.   Yes Historical Provider, MD  albuterol (PROVENTIL) (2.5 MG/3ML) 0.083% nebulizer solution Take 2.5 mg by nebulization every 3 (three) hours as needed for wheezing or shortness of breath.  09/18/12 01/28/15  Tanda Rockers, MD  ALPRAZolam Duanne Moron) 0.25 MG tablet Take 0.25 mg by mouth 2 (two) times daily as needed for anxiety.     Historical Provider, MD  Azelastine-Fluticasone (DYMISTA) 137-50 MCG/ACT SUSP Place 1 spray into the nose 2 (two) times daily as needed. For seasonal allergies    Historical Provider, MD  bisoprolol (ZEBETA) 5 MG tablet Take 2.5 mg by mouth daily.    Historical Provider, MD  calcium-vitamin D (OSCAL WITH D) 500-200 MG-UNIT per tablet Take 1 tablet by mouth 2 (two) times daily.      Historical Provider, MD  cetirizine (ZYRTEC) 10 MG tablet Take 10 mg by mouth at bedtime.    Historical Provider, MD  ciclesonide (OMNARIS) 50 MCG/ACT nasal spray Place 2 sprays into both nostrils 2 (two) times daily.    Historical Provider, MD  clopidogrel (PLAVIX) 75 MG tablet Take 75 mg by mouth daily.    Historical Provider, MD  famotidine (PEPCID) 20 MG tablet Take 20 mg by mouth at bedtime.     Historical Provider, MD  fluticasone (FLONASE) 50 MCG/ACT nasal spray Place 2 sprays into both nostrils daily.    Historical Provider, MD  guaiFENesin (MUCINEX) 600 MG 12 hr tablet Take 600 mg by mouth 2 (two) times daily as needed for cough.     Historical Provider, MD  lansoprazole (PREVACID) 15 MG capsule Take 15 mg by mouth daily at 12 noon.  04/30/14   Historical Provider, MD  latanoprost (XALATAN) 0.005 %  ophthalmic solution Place 1 drop into both eyes at bedtime.  12/14/10   Historical Provider, MD  levothyroxine (SYNTHROID, LEVOTHROID) 125 MCG tablet Take 125 mcg by mouth daily before breakfast.    Historical Provider, MD  nisoldipine (SULAR) 8.5 MG 24 hr tablet Take 8.5 mg by mouth daily.    Historical Provider, MD  PERFOROMIST 20 MCG/2ML nebulizer solution  04/11/14   Historical Provider, MD  polyethylene glycol powder (GLYCOLAX/MIRALAX) powder Take 17 g by mouth daily. 05/20/14   Historical Provider, MD  potassium chloride SA (K-DUR,KLOR-CON) 20 MEQ tablet Take 20 mEq by mouth daily.  04/30/14   Historical Provider, MD  predniSONE (DELTASONE) 10 MG tablet Take 10 mg by mouth daily with breakfast.    Historical Provider, MD  Probiotic Product (PROBIOTIC DAILY) CAPS Take 1 capsule by mouth every morning.    Historical Provider, MD  Pseudoephedrine-Ibuprofen (ADVIL COLD/SINUS PO) Take 1 tablet by mouth daily as needed (sinus pressure).    Historical Provider, MD  QVAR 80 MCG/ACT inhaler Inhale 1 puff into the lungs  2 (two) times daily.  05/07/14   Historical Provider, MD  sertraline (ZOLOFT) 25 MG tablet Take 25 mg by mouth daily.    Historical Provider, MD   Physical Exam: Filed Vitals:   05/27/14 1545  BP: 170/82  Pulse: 87  Temp:   Resp: 26   Constitutional: Oriented to person, place, and time. Well-developed and well-nourished. Cooperative.  Head: Normocephalic and atraumatic.  Nose: Nose normal.  Mouth/Throat: Uvula is midline, oropharynx is clear and moist and mucous membranes are normal.  Eyes: Conjunctivae and EOM are normal. Pupils are equal, round, and reactive to light.  Neck: Trachea normal and normal range of motion. Neck supple.  Cardiovascular: Normal rate, regular rhythm, S1 normal, S2 normal, normal heart sounds and intact distal pulses.   Pulmonary/Chest: Effort normal and breath sounds normal.  Abdominal: Soft. Bowel sounds are normal. There is no hepatosplenomegaly. There  is no tenderness.  Musculoskeletal: Normal range of motion.  Neurological: Alert and oriented to person, place, and time. Has normal strength. No cranial nerve deficit or sensory deficit.  Skin: Skin is warm, dry and intact.  Psychiatric: Has a normal mood and affect. Speech is normal and behavior is normal.   Labs on Admission:  Basic Metabolic Panel:  Recent Labs Lab 05/26/14 05/27/14 1340 05/27/14 1414  NA 126* 129*  --   K 4.0 4.2  --   CL  --  89*  --   CO2  --  25  --   GLUCOSE  --  125*  --   BUN 13 16  --   CREATININE 0.8 0.75  --   CALCIUM  --  9.1  --   MG  --   --  2.3  PHOS  --   --  3.4   Liver Function Tests:  Recent Labs Lab 05/26/14 05/27/14 1340  AST 20 29  ALT 13 17  ALKPHOS 203* 304*  BILITOT  --  0.5  PROT  --  7.4  ALBUMIN  --  3.3*   No results found for this basename: LIPASE, AMYLASE,  in the last 168 hours No results found for this basename: AMMONIA,  in the last 168 hours CBC:  Recent Labs Lab 05/26/14 05/27/14 1340  WBC 17.0 22.7*  HGB 10.9* 12.5  HCT 32* 39.3  MCV  --  79.2  PLT 575* 596*   Cardiac Enzymes: No results found for this basename: CKTOTAL, CKMB, CKMBINDEX, TROPONINI,  in the last 168 hours  BNP (last 3 results)  Recent Labs  05/11/14 1122 05/11/14 1430 05/27/14 1414  PROBNP 442.9 1377.0* 528.2*   CBG:  Recent Labs Lab 05/27/14 1406  GLUCAP 123*    Radiological Exams on Admission: Dg Chest 1 View  05/27/2014   CLINICAL DATA:  Followup pneumonia.  EXAM: CHEST - 1 VIEW  COMPARISON:  05/15/2014  FINDINGS: Normal heart size. Stable upper mediastinal contours. There is chronic lung disease with pulmonary hyperinflation and coarse reticular nodular opacities at the bases. These findings are stable from previous. The patient has a background of bronchiectasis and micro nodularity, likely chronic indolent infection based on previous CT imaging.  IMPRESSION: Stable appearance of the chest from 05/15/2014. Residual lung  opacities are near the patient's baseline; based on CT the chronic lung disease is likely from indolent infection (as with mycobacterium avium) with bronchiectasis.   Electronically Signed   By: Jorje Guild M.D.   On: 05/27/2014 11:59   Dg Chest Port 1 View  05/27/2014  CLINICAL DATA:  Altered mental status  EXAM: PORTABLE CHEST - 1 VIEW  COMPARISON:  Portable chest x-ray of May 27, 2014 at 11:45 a.m. and portable chest x-ray of May 15, 2014  FINDINGS: The lungs are adequately inflated. There remain coarse interstitial lung markings in the mid and lower lung zones bilaterally, greater on the right than on the left. The cardiac silhouette is top-normal in size. The pulmonary vascularity is not engorged. The mediastinum is normal in width. The bony thorax is unremarkable.  IMPRESSION: There has not been significant interval change since a study of earlier today. There has been slight interval improvement since the study of May 15, 2014.   Electronically Signed   By: David  Martinique   On: 05/27/2014 14:36    EKG: Independently reviewed.   Assessment/Plan Principal Problem:   HCAP (healthcare-associated pneumonia) Active Problems:   HYPOTHYROIDISM   BRONCHIECTASIS W/ACUTE EXACERBATION   Memory loss   Acute encephalopathy   Acute respiratory failure with hypoxia    Healthcare associated pneumonia -Patient presented to the hospital with altered mental status, confusion, increased sputum production and leukocytosis. -Although chest x-ray did not show acute changes but patient seems to have bronchiectasis with acute exacerbation. -This will be treated as a health care associated pneumonia with vancomycin and cefepime. -Supportive management with bronchodilators, mucolytics, antitussives and oxygen as needed.  Bronchiectasis with acute exacerbation -Patient has known bilateral bronchiectasis, presented with more sputum production. -Started on vancomycin and cefepime, continue current  antibiotics. -Patient will need pulmonary toilet. -Has history of Pseudomonas in the sputum so will start Tobi.  Acute respiratory failure with hypoxia -Patient PO2 on the blood gas is 49 mmHg, patient placed on 4 L of oxygen initially. -Continue to provide oxygen to the lung function improves.  Acute encephalopathy -Likely secondary to concurrent infection. Patient has history of memory loss/dementia. -This is likely acute delirium secondary to her bronchiectasis exacerbation.  Chronic diastolic CHF -Patient has history of grade 1 diastolic dysfunction from 2-D echo in 05/12/2014. Has normal LVEF. -Patient started on slow rate of IV fluids of 75 mL/hour. -Assess IV fluids status in a.m., likely can discontinue.  Code Status: Full code Family Communication: No family at bedside. Disposition Plan: Telemetry, low threshold for stepdown in ICU.  Time spent: 70 minutes  [facility] Pager [phone]

## 2014-05-27 NOTE — Patient Instructions (Addendum)
Transfer to ER for evaluation.

## 2014-05-27 NOTE — ED Provider Notes (Signed)
CSN: [id number]     Arrival date & time 05/27/14  1317 History   First MD Initiated Contact with Patient 05/27/14 1359     Chief Complaint  Patient presents with  . Altered Mental Status     (Consider location/radiation/quality/duration/timing/severity/associated sxs/prior Treatment) HPI This patient was taken to her primary care physician's office for a followup visit after a hospital admission for pneumonia. Patient typically lives in an assisted living facility. She was dropped off and found by the primary care provider to be very confused and disoriented. Is not her typical baseline. The patient does endorse that she had the recent pneumonia and finds that coughing and shortness of breath have been worsening for her. However of note she is quite confused and a poor historian. Some of her speech rambles tangentially and is not situation appropriate. Predominantly identifies that she has been feeling more ill and weak. She does not identify localizing pain.  Past Medical History  Diagnosis Date  . Bronchiectasis with acute exacerbation 05/16/14    acute respiratory failure   . Other and unspecified hyperlipidemia   . TIA (transient ischemic attack)   . Osteoporosis, unspecified   . Asthma   . Esophageal reflux   . IBS (irritable bowel syndrome)   . Diverticulosis   . Hemorrhoids   . Hypothyroidism   . Schatzki's ring   . Hiatal hernia   . Tubular adenoma of colon 12/2008  . Chronic respiratory failure 05/16/14  . HCAP (healthcare-associated pneumonia) 05/16/14  . Sinusitis   . GERD (gastroesophageal reflux disease)   . HTN (hypertension)   . Encephalopathy 05/16/14    noted during hospitalization  . Depressive disorder, not elsewhere classified   . Diastolic dysfunction, left ventricle 05/16/14  . Memory loss   . Pubic ramus fracture   . Hiatal hernia   . Tubular adenoma of colon   . Hypoxia   . Anemia, unspecified   . Constipation    Past Surgical History  Procedure  Laterality Date  . Cholecystectomy  1995  . Nasal sinus surgery    . Tonsillectomy    . Foot surgery    . Small intestine surgery  05/2008   Family History  Problem Relation Age of Onset  . Hypertension Sister   . Heart disease Mother   . Heart disease Father   . Stroke Father   . Multiple myeloma Maternal Aunt   . Colon cancer Mother    History  Substance Use Topics  . Smoking status: Never Smoker   . Smokeless tobacco: Never Used  . Alcohol Use: No   OB History   Grav Para Term Preterm Abortions TAB SAB Ect Mult Living                 Review of Systems The patient is unable to perform a reliable review of systems. It has been no provider report of recent fever. The patient is identifying anorexia, shortness of breath, coughing, general weakness.   Allergies  Chocolate; Codeine; Erythromycin ethylsuccinate; Iodine; Iohexol; Sulfamethoxazole; and Septra  Home Medications   Prior to Admission medications   Medication Sig Start Date End Date Taking? Authorizing Provider  albuterol (PROVENTIL) (2.5 MG/3ML) 0.083% nebulizer solution Take 2.5 mg by nebulization every 3 (three) hours as needed for wheezing or shortness of breath.  09/18/12 01/28/15  Tanda Rockers, MD  ALPRAZolam Duanne Moron) 0.25 MG tablet Take 0.25 mg by mouth 2 (two) times daily as needed for anxiety.     Historical  Provider, MD  arformoterol (BROVANA) 15 MCG/2ML NEBU Take 2 mLs (15 mcg total) by nebulization every 12 (twelve) hours. 05/15/14   Bernadene Person, NP  Azelastine-Fluticasone (DYMISTA) 137-50 MCG/ACT SUSP Place 1 spray into the nose 2 (two) times daily as needed. For seasonal allergies    Historical Provider, MD  bisoprolol (ZEBETA) 5 MG tablet Take 2.5 mg by mouth daily.    Historical Provider, MD  budesonide (PULMICORT) 0.5 MG/2ML nebulizer solution Take 2 mLs (0.5 mg total) by nebulization 2 (two) times daily. 05/15/14   Bernadene Person, NP  calcium-vitamin D (OSCAL WITH D) 500-200 MG-UNIT per  tablet Take 1 tablet by mouth 2 (two) times daily.      Historical Provider, MD  cetirizine (ZYRTEC) 10 MG tablet Take 10 mg by mouth at bedtime.    Historical Provider, MD  ciclesonide (OMNARIS) 50 MCG/ACT nasal spray Place 2 sprays into both nostrils 2 (two) times daily.    Historical Provider, MD  clopidogrel (PLAVIX) 75 MG tablet Take 75 mg by mouth daily.    Historical Provider, MD  famotidine (PEPCID) 20 MG tablet Take 20 mg by mouth at bedtime.     Historical Provider, MD  fluticasone (FLONASE) 50 MCG/ACT nasal spray Place 2 sprays into both nostrils daily.    Historical Provider, MD  guaiFENesin (MUCINEX) 600 MG 12 hr tablet Take 600 mg by mouth 2 (two) times daily as needed for cough.     Historical Provider, MD  lansoprazole (PREVACID) 15 MG capsule  04/30/14   Historical Provider, MD  latanoprost (XALATAN) 0.005 % ophthalmic solution Place 1 drop into both eyes at bedtime.  12/14/10   Historical Provider, MD  levothyroxine (SYNTHROID, LEVOTHROID) 125 MCG tablet Take 125 mcg by mouth daily before breakfast.    Historical Provider, MD  nisoldipine (SULAR) 8.5 MG 24 hr tablet Take 8.5 mg by mouth daily.    Historical Provider, MD  PERFOROMIST 20 MCG/2ML nebulizer solution  04/11/14   Historical Provider, MD  polyethylene glycol powder (GLYCOLAX/MIRALAX) powder Take 17 g by mouth daily. 05/20/14   Historical Provider, MD  potassium chloride SA (K-DUR,KLOR-CON) 20 MEQ tablet  04/30/14   Historical Provider, MD  predniSONE (DELTASONE) 10 MG tablet Take 10 mg by mouth daily with breakfast.    Historical Provider, MD  Probiotic Product (PROBIOTIC DAILY) CAPS Take 1 capsule by mouth every morning.    Historical Provider, MD  Pseudoephedrine-Ibuprofen (ADVIL COLD/SINUS PO) Take 1 tablet by mouth daily as needed (sinus pressure).    Historical Provider, MD  QVAR 80 MCG/ACT inhaler  05/07/14   Historical Provider, MD  sertraline (ZOLOFT) 25 MG tablet Take 25 mg by mouth daily.    Historical Provider, MD   traMADol (ULTRAM) 50 MG tablet Take 1 tablet (50 mg total) by mouth every 12 (twelve) hours as needed for moderate pain. 05/15/14   Bernadene Person, NP   BP 170/82  Pulse 87  Temp(Src) 98.1 F (36.7 C) (Rectal)  Resp 26  Ht 5\' 2"  (1.575 m)  Wt 120 lb (54.432 kg)  BMI 21.94 kg/m2  SpO2 100% Physical Exam This is a elderly ill-appearing female. Head face is normocephalic atraumatic. For motions are intact. No ocular drainage or discharge the Oral mucosa shows glossitis and slight dryness of the mucous membranes the The neck is supple without meningismus the  Occasional expiratory wheeze and occasional rhonchi. Patient shows mild to moderate increased work of breathing. It is intermittent wet sounding cough. Heart sounds are  distant and tachycardic. Abdomen is soft without reproducible tenderness no guarding. Extremities show senile ecchymosis, no significant peripheral edema Patient is spontaneously moving all extremities however she seems confused and has difficulty following all commands. Neurologic exam is nonfocal for motor weakness of her cognition shows wandering thoughts and probable delirium. Skin is warm and dry without any focal rashes extensive senile ecchymoses  ED Course  Procedures (including critical care time) Labs Review Labs Reviewed  CBC - Abnormal; Notable for the following:    WBC 22.7 (*)    MCH 25.2 (*)    RDW 16.8 (*)    Platelets 596 (*)    All other components within normal limits  COMPREHENSIVE METABOLIC PANEL - Abnormal; Notable for the following:    Sodium 129 (*)    Chloride 89 (*)    Glucose, Bld 125 (*)    Albumin 3.3 (*)    Alkaline Phosphatase 304 (*)    GFR calc non Af Amer 79 (*)    All other components within normal limits  PRO B NATRIURETIC PEPTIDE - Abnormal; Notable for the following:    Pro B Natriuretic peptide (BNP) 528.2 (*)    All other components within normal limits  CBG MONITORING, ED - Abnormal; Notable for the  following:    Glucose-Capillary 123 (*)    All other components within normal limits  I-STAT ARTERIAL BLOOD GAS, ED - Abnormal; Notable for the following:    pCO2 arterial 46.4 (*)    pO2, Arterial 49.0 (*)    Bicarbonate 29.5 (*)    Acid-Base Excess 4.0 (*)    All other components within normal limits  CULTURE, BLOOD (ROUTINE X 2)  CULTURE, BLOOD (ROUTINE X 2)  URINALYSIS, ROUTINE W REFLEX MICROSCOPIC  APTT  PROTIME-INR  PHOSPHORUS  MAGNESIUM  I-STAT TROPOININ, ED  I-STAT CG4 LACTIC ACID, ED    Imaging Review Dg Chest 1 View  05/27/2014   CLINICAL DATA:  Followup pneumonia.  EXAM: CHEST - 1 VIEW  COMPARISON:  05/15/2014  FINDINGS: Normal heart size. Stable upper mediastinal contours. There is chronic lung disease with pulmonary hyperinflation and coarse reticular nodular opacities at the bases. These findings are stable from previous. The patient has a background of bronchiectasis and micro nodularity, likely chronic indolent infection based on previous CT imaging.  IMPRESSION: Stable appearance of the chest from 05/15/2014. 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   Electronically Signed   By: Jorje Guild M.D.   On: 05/27/2014 11:59   Dg Chest Port 1 View  05/27/2014   CLINICAL DATA:  Altered mental status  EXAM: PORTABLE CHEST - 1 VIEW  COMPARISON:  Portable chest x-ray of May 27, 2014 at 11:45 a.m. and portable chest x-ray of May 15, 2014  FINDINGS: The lungs are adequately inflated. There remain coarse interstitial lung markings in the mid and lower lung zones bilaterally, greater on the right than on the left. The cardiac silhouette is top-normal in size. The pulmonary vascularity is not engorged. The mediastinum is normal in width. The bony thorax is unremarkable.  IMPRESSION: There has not been significant interval change since a study of  earlier today. There has been slight interval improvement since the study of May 15, 2014.   Electronically Signed   By: David  Martinique   On: 05/27/2014 14:36     EKG Interpretation None  CRITICAL CARE Performed by: Charlesetta Shanks   Total critical care time: 30  Critical care time was exclusive of separately billable procedures and treating other patients.  Critical care was necessary to treat or prevent imminent or life-threatening deterioration.  Critical care was time spent personally by me on the following activities: development of treatment plan with patient and/or surrogate as well as nursing, discussions with consultants, evaluation of patient's response to treatment, examination of patient, obtaining history from patient or surrogate, ordering and performing treatments and interventions, ordering and review of laboratory studies, ordering and review of radiographic studies, pulse oximetry and re-evaluation of patient's condition. The patient's case was reviewed with the hospitalist [facility] for admission. MDM   Final diagnoses:  HCAP (healthcare-associated pneumonia)  Bronchiectasis with acute exacerbation   This patient has chronic respiratory failure. She had a recent hospitalization. Patient's condition has worsened. This point in time she has been initiated on treatment for help healthcare associated pneumonia with associated delirium.    Charlesetta Shanks, MD 05/27/14 [phone]

## 2014-05-27 NOTE — Progress Notes (Signed)
ANTIBIOTIC CONSULT NOTE - INITIAL  Pharmacy Consult for vancomycin Indication: HCAP  Allergies  Allergen Reactions  . Chocolate Cough  . Codeine     Unknown reaction  . Erythromycin Ethylsuccinate     REACTION: unspecified  . Iodine   . Iohexol      Desc: pt states allergy many years ago--unable to remember type of allergy--pre med in future slg 06/13/08   . Sulfamethoxazole     REACTION: unspecified  . Septra [Bactrim] Itching and Rash    Patient Measurements: Height: 5\' 2"  (157.5 cm) Weight: 120 lb (54.432 kg) IBW/kg (Calculated) : 50.1   Vital Signs: Temp: 98.4 F (36.9 C) (09/01 1322) Temp src: Oral (09/01 1322) BP: 166/73 mmHg (09/01 1400) Pulse Rate: 84 (09/01 1400) Intake/Output from previous day:   Intake/Output from this shift:    Labs:  Recent Labs  05/27/14 1340  WBC 22.7*  HGB 12.5  PLT 596*   Estimated Creatinine Clearance: 45.8 ml/min (by C-G formula based on Cr of 0.55). No results found for this basename: VANCOTROUGH, Corlis Leak, VANCORANDOM, [place], GENTPEAK, GENTRANDOM, TOBRATROUGH, TOBRAPEAK, TOBRARND, AMIKACINPEAK, AMIKACINTROU, AMIKACIN,  in the last 72 hours   Microbiology: Recent Results (from the past 720 hour(s))  CULTURE, BLOOD (ROUTINE X 2)     Status: None   Collection Time    05/11/14  2:15 PM      Result Value Ref Range Status   Specimen Description BLOOD LEFT ARM   Final   Special Requests BOTTLES DRAWN AEROBIC AND ANAEROBIC 10CC   Final   Culture  Setup Time     Final   Value: 05/12/2014 00:56     Performed at [facility]   Culture     Final   Value: NO GROWTH 5 DAYS     Performed at [facility]   Report Status 05/18/2014 FINAL   Final  CULTURE, BLOOD (ROUTINE X 2)     Status: None   Collection Time    05/11/14  2:30 PM      Result Value Ref Range Status   Specimen Description BLOOD LEFT HAND   Final   Special Requests BOTTLES DRAWN AEROBIC AND ANAEROBIC 10CC   Final   Culture  Setup Time      Final   Value: 05/12/2014 00:57     Performed at [facility]   Culture     Final   Value: NO GROWTH 5 DAYS     Performed at [facility]   Report Status 05/18/2014 FINAL   Final  MRSA PCR SCREENING     Status: Abnormal   Collection Time    05/12/14 11:06 AM      Result Value Ref Range Status   MRSA by PCR POSITIVE (*) NEGATIVE Final   Comment:            The GeneXpert MRSA Assay (FDA     approved for NASAL specimens     only), is one component of a     comprehensive MRSA colonization     surveillance program. It is not     intended to diagnose MRSA     infection nor to guide or     monitor treatment for     MRSA infections.     RESULT CALLED TO, READ BACK BY AND VERIFIED WITH:     C. LUCK RN 13:10 05/12/14 ([email or web address])    Medical History: Past Medical History  Diagnosis Date  . Bronchiectasis with acute exacerbation  05/16/14    acute respiratory failure   . Other and unspecified hyperlipidemia   . TIA (transient ischemic attack)   . Osteoporosis, unspecified   . Asthma   . Esophageal reflux   . IBS (irritable bowel syndrome)   . Diverticulosis   . Hemorrhoids   . Hypothyroidism   . Schatzki's ring   . Hiatal hernia   . Tubular adenoma of colon 12/2008  . Chronic respiratory failure 05/16/14  . HCAP (healthcare-associated pneumonia) 05/16/14  . Sinusitis   . GERD (gastroesophageal reflux disease)   . HTN (hypertension)   . Encephalopathy 05/16/14    noted during hospitalization  . Depressive disorder, not elsewhere classified   . Diastolic dysfunction, left ventricle 05/16/14  . Memory loss   . Pubic ramus fracture   . Hiatal hernia   . Tubular adenoma of colon   . Hypoxia   . Anemia, unspecified   . Constipation     Assessment: [name] brought to ED from MD's office.  Pharmacy consulted to dose vancomycin for HCAP.  PMH significant for severe chronic sinusitis/bronchectasis on home oxygen and chronic steroids.   At MD office for f/u visit for PNA  and found to have altered mental status including visual and auditory hallucinations.  WBC elevated at 22.7 but pt on a steroid taper, Afebrile, CXR 9/1: stable appearance from 05/15/14. Residual lung opacities are near her baseline; based on CT the chronic lung dz is likely from indolent infection (as with mycobacterium avium) with bronchiectasis.  Goal of Therapy:  Vancomycin trough level 15-20 mcg/ml  Plan:  Vancomycin 1 gm IV x 1 dose, then vancomycin 500 mg IV q12h F/u renal function, wbc, temp, culture data, cxr, clinical course Steady-state vancomycin trough as needed  Eudelia Bunch, Pharm.D. [phone] 05/27/2014 2:26 PM

## 2014-05-27 NOTE — Progress Notes (Signed)
Subjective:     Patient ID: [name], female   DOB: 02-11-1933 .   MRN: [id number]   Brief patient profile:  71 yowf never smoker with documented chronic sinusitis /bronchiectasis and a tendency to recurrent purulent exacerbations, has been self titrating prednisone with a ceiling of 40 mg per day and a floor of 5 extensively evaluated at [facility] and by Dr Allena Katz with neg w/u x atopic.    History of Present Illness  12/19/2011 Follow up  Patient returns today for a followup visit. Patient has history of osteoporosis and has been on yearly Reclast infusions. She is due for her Reclast this month. Takes oscal d Twice daily   Over the last week. Complains of increased cough, congestion, with thick, green mucus. Has not filled her standing rx for omnicef. Currently on prednisone 10mg  daily  No hemoptysis , chest pain or edema.  >>Reclast rx and Omnicef rx x 10 d       04/26/2013 Acute OV  Complains of increased urinary frequency, with cloudiness x2 weeks.  Dyspnea is unchanged; denies dysurina, pressure, odor. No back pain, n/v, hematuria or fever.  No recent UTI . Over last 2 weeks more symptoms of urinary urgency and frequenty. Last few days, urine has strong odor and is dark and cloudy. No dysuria or discharge. Also BMD returned with worsening T score -3.2. Discussed continuing yearly Reclast. Advised to cont on calcium and vitamin d.  rec Cipro 250mg  Twice daily  For 7 days  Push fluids  Healthy urinary practices.  Return in couple of weeks for labs for Reclast> did not return   06/12/2013 f/u ov/[name] re: bronchiectasis/ hypothyroid/hbp/ yearly comprehensive  Chief Complaint  Patient presents with  . Annual Exam    Pt fasting. Pt c/o increased SOB over the past several months. Her cough is also worse and is prod with green sputum for the past 2 months. Taking pred 10 mg daily.   doxy rx > no better.  Can't tol vest.   >>labs w/ good cholesterol at goal , TSH elevated w/  increased dose of synthroid rx   07/09/2013 Follow up  4 week follow up Bronchiectasis and discuss Reclast  Had recent flare of bronchiectasis that resolved with abx and return to her baseline.  Reports breathing is primarily unchanged since last ov  . Cough not quite as bad. No fever or hemoptysis.  Is due for Reclast. BMD w/ T score -3.2.  Lost her long term boyfriend last week, he passed away with dementia. They never married. Says she is dealing ok but is very sad, support given  She remains in [place] at [facility].  rec No change rx  08/07/2013 f/u ov/[name] re: bronchiectasis / hypothyroid/hypertension/ osteo Chief Complaint  Patient presents with  . Acute Visit    Increased SOB,  cough w/ green mucus, wheezing and tightness x2 months    Just fiinshed a burst of pred, no recent abx,not really using her prns aggressively. Sob with more than room to room walking/ 02 dep  >CT chest showed 08/09/13 extensive cylindrical bronchiectasis w/ extensive mucoid impaction  rec No change rx/ ice pack to R shoulder prn  09/16/2013 Follow up  Pt returns ov for 6 week follow up for bronchiectasis.  Patient with recent bronchiectasis, flare, and treated with a course of prednisone, with her aggressive pulmonary hygiene with nebulizers. Patient reports she is slowly improving, however, to have a daily. Chronic cough. Patient had a CT  chest last visit showing extensive bronchiectasis with extensive mucoid impactions, which has been similar to her CTs in the past. Patient was also having some right shoulder blade pain. It has resolved.  rec  no change rx   10/28/2013 f/u ov/[name] re:  Chief Complaint  Patient presents with  . Follow-up    Pt states that her cough and dyspnea are progressively worse since the last visit. Cough is prod with minimal green sputum.   walks from her room to cafeteria on 02 4lpm pulsed, 2lpm at home   >>no changes   01/27/2014 Follow up  Returns for  3  month follow up Reports dyspnea with walking is getting worse. Wears out easily . Was suppose to use Oxygen 4ll/m continuous flow from last ov however on pulsing device on arrival with O2 sats 79-84%.  We discussed using her walker with basket to carry O2 tank.  Had increase cough and wheezing last week -did pred taper, now back to 10mg  daily .  No fever, increased discolored mucus, abd pain, n/v/d, or edema, or chest pain.  rec Wear Oxygen 3 l/m at rest and 4l/m continuous flow walking  Mucinex  Twice daily  As needed  Cough/congestion  Along w/ flutter valve    02/10/2014 f/u ov/[name] re: ab/bronchiectasis/ has med calendar but not using action plan Chief Complaint  Patient presents with  . Follow-up    Pt states that her breathing is progressively worse since the last visit. She is currently on 40 mg prednisone. She c/o prod cough with large amounts of yellow sputum x 1 wk.    not using augmentin, nasal steroids, afrin as per calendar and having more purulent sputum and nasal congestion. Comfortable at rest p saba but only using once or twice a day rec Follow the med calendar  03/11/2014 f/u ov/[name] re: prednisone at 10 baseline, when feeling fine no need for albuterol while on perforimist bid Chief Complaint  Patient presents with  . Acute Visit    Pt c/o increased DOE for the past 5 days. She states that she gets out of breath just walking across the room.  She also c/o increased cough with large amounts of yellow sputum and wheezing. She has been using albuterol neb at least twice per day and as of today increased pred to 40 mg.   worse x 5 days, just increased the prednisone one day prior to OV, has not yet started omnaris or augmentin from her action plans.  No sob at rest  >>pred burst .    04/07/2014 Acute OV  Complains of increased SOB, wheezing, prod cough with green mucus x3 days.  Denies hemoptysis, n/v/d, PND, leg swelling, orthopnea or fever.  Currently on prednisone 10mg   daily  Returns back to office on pulsing O2 , desats on demad device. We discussed importance of continuous flow o2 and she agrees to change devices .  >augmentin /pred burst   05/27/2014 [facility] follow up  Patient returns for a post hospital followup. Patient was admitted August 16 through August 21 for healthcare associated pneumonia, and bronchiectasis, exacerbation. Unfortunately, patient had a fall on 816 , and suffered a right pubic ramus fracture. Patient was sent back to her assisted living on pain medications and return shortly back to the emergency room in respiratory distress. She was found to have significant pulmonary edema, and a new HCAP. She was placed on BiPAP support. Treated with aggressive IV antibiotics. She also had acute encephalopathy, and was  given Narcan. She did have a bump in her troponin felt to be stress induced. Echo showed a grade 1 diastolic dysfunction. BNP was elevated and she received gentle diuresis. CT chest showed patchy, groundglass a pacer the, with consolidation in the left lower lobe. Was negative for pulmonary embolus am. Venous Doppler of her legs showed no DVT. She was transitioned over to oral antibiotics, Levaquin. At discharge. Patient presents today in wheelchair appears very weak. Does have some confusion today, did not recognize me at all. Disoriented to surrounding and appears to have some visual hallucinations.   Notes from the nursing home were reviewed. Patient's TSH has been elevated and her Synthroid has been adjusted per NH medical staff.   Chest x-ray today shows chronic changes with no acute process noted.   Current Medications, Allergies, Complete Past Medical History, Past Surgical History, Family History, and Social History were reviewed in [employer] record.  ROS  The following are not active complaints unless bolded sore throat, dysphagia, dental problems, itching, sneezing,  nasal congestion or excess/  purulent secretions, ear ache,   fever, chills, sweats, unintended wt loss, pleuritic or exertional cp, hemoptysis,  orthopnea pnd or leg swelling, presyncope, palpitations, heartburn, abdominal pain, anorexia, nausea, vomiting, diarrhea  or change in bowel or urinary habits, change in stools or urine, dysuria,hematuria,  rash, arthralgias, visual complaints, headache, numbness weakness or ataxia or problems with walking or coordination,  change in mood/affect or memory.          Allergies  1) ! Iodine  2) Sulfamethoxazole (Sulfamethoxazole)  3) Erythromycin Ethylsuccinate    Past Medical History:  HYPOTHYROIDISM (ICD-244.9)  BRONCHIECTASIS W/ACUTE EXACERBATION (ICD-494.1)  - Xolair attempted 6/04 -> 9/05 no benefit - Immunotherapy stopped 04/2008  - Started xyflo 8/09 > 05/2008 no benefit  - VEST 2008 as inpt > no benefit  - Flutter valve helpful March 17, 2009  - Allergy profile April 12, 2010 >> IgE 30 unremarkable  - Dulera 200 added November 22, 2010  Could not tol thrush HYPERLIPIDEMIA (ICD-272.4)  - Target LDL < 70 due to prev TIA's  TRANSIENT ISCHEMIC ATTACKS, HX OF (ICD-V12.50).............................................Marland KitchenReynolds  OSTEOPOROSIS (ICD-733.00)  - Reclast January 2009, 2010, October 26, 2009 , 11/2010 , 11/3011 , due >01/10/12  - Bone Density 08/05/09 Spine 0.1 Left -2.2, Right -1.6  Health Maintenance...................................................................................................Marland KitchenWert  -Td 11/2003 -Pneumovax 11/04 and 05/2008 -Med calendar 12/24/08  And 03/01/11, 10/06/2011  -CPX 06/12/2013  -GYN health maint...........................................................................Marland Kitchen  Mody Asthma  Diverticulosis  - Colonoscopy 01/01/09.....................................................................Marland KitchenFuller Plan  GERD  Irritable Bowel Syndrome  Hemorrhoids  R Shoulder  Pain..................................................................................[place]  Hypothyroid     Social History She never married. No kids. Took care of her parents all her life  She has a gentlemen friend that she has been with since she was 51 but he lives in [place]  (He is now in his 63s but she never wanted to marry him).  Her mother died 06-29-2011 She was a Mudlogger work.  Moved to Friend's home 2013 Has a sister OOT      Objective:   Physical Exam  wt 105 January 07, 2009 > 114 November 19, 2009 >   03/12/2012 117 > 06/27/2012  111> 105 08/03/2012  >106 08/09/2012 > 105 09/10/2012 >  108 12/25/12>108 04/26/13 > 06/12/2013 104 >104 07/09/2013 > 08/07/2013 106 >107 09/16/2013  > 10/28/2013  105 >105 01/27/2014 > 104 02/10/2014 > .03/11/14 106    W/c bound  elderly wf nad  Chronically ill appearing   HEENT: nl dentition, turbinates, and orophanx  Nasal clear drainage. ,max sinus tenderness  NECK : without JVD/Nodes/TM/ nl carotid upstrokes bilaterally  LUNGS: no acc muscle use, few rhonchi  CV: RRR no s3 or murmur or increase in P2, tr edema R>:L Mild right calf tenderness   ABD: soft and nontender with nl excursion in the supine position. No bruits or organomegaly, bowel sounds nl   MS: nl gait/  warm without deformities, calf tenderness, cyanosis or clubbing Neuro  confused     CXR 05/27/2014 >Stable appearance of the chest from 05/15/2014. Residual lung   opacities are near the patient's baseline; based on CT the chronic lung disease is likely from indolent infection (as with mycobacterium avium) with bronchiectasis.       Assessment:

## 2014-05-27 NOTE — Progress Notes (Signed)
Chart and ov reviewed and agree with a/p

## 2014-05-27 NOTE — ED Notes (Signed)
Pt was brought in by EMS from her Dr's office. Patient was at office for a follow-up visit for pneumonia. EMS was called because per Dr's Office and had altered mental status that was worse than her usual.   Per doctors office, pt is having visual and auditory hallucinations. VS stable.

## 2014-05-27 NOTE — Assessment & Plan Note (Signed)
Worsening Encephalopathy ? Etiology  Contributing factors ? Possible dehydration, thyroid dysfxn +/- infection vs neuro event vs hypercarbia   Needs further workup with consideration of CT head , labs, UA/cx  Will transfer to ER for evaluation and consideration of admission  cXr appears to be more chronic without acute process noted.

## 2014-05-28 ENCOUNTER — Telehealth: Payer: Self-pay | Admitting: Internal Medicine

## 2014-05-28 DIAGNOSIS — J9600 Acute respiratory failure, unspecified whether with hypoxia or hypercapnia: Secondary | ICD-10-CM

## 2014-05-28 DIAGNOSIS — I10 Essential (primary) hypertension: Secondary | ICD-10-CM

## 2014-05-28 DIAGNOSIS — J471 Bronchiectasis with (acute) exacerbation: Secondary | ICD-10-CM

## 2014-05-28 DIAGNOSIS — E039 Hypothyroidism, unspecified: Secondary | ICD-10-CM

## 2014-05-28 DIAGNOSIS — G9340 Encephalopathy, unspecified: Secondary | ICD-10-CM

## 2014-05-28 DIAGNOSIS — E43 Unspecified severe protein-calorie malnutrition: Secondary | ICD-10-CM | POA: Insufficient documentation

## 2014-05-28 DIAGNOSIS — I519 Heart disease, unspecified: Secondary | ICD-10-CM

## 2014-05-28 LAB — BASIC METABOLIC PANEL
Anion gap: 12 (ref 5–15)
BUN: 14 mg/dL (ref 6–23)
CO2: 25 meq/L (ref 19–32)
Calcium: 7.6 mg/dL — ABNORMAL LOW (ref 8.4–10.5)
Chloride: 94 mEq/L — ABNORMAL LOW (ref 96–112)
Creatinine, Ser: 0.71 mg/dL (ref 0.50–1.10)
GFR calc Af Amer: >90 mL/min (ref >90)
GFR, EST NON AFRICAN AMERICAN: 80 mL/min — AB (ref >90)
GLUCOSE: 112 mg/dL — AB (ref 70–99)
Potassium: 4.5 mEq/L (ref 3.7–5.3)
SODIUM: 131 meq/L — AB (ref 137–147)

## 2014-05-28 LAB — CBC
HCT: 33.5 % — ABNORMAL LOW (ref 36.0–46.0)
Hemoglobin: 10.3 g/dL — ABNORMAL LOW (ref 12.0–15.0)
MCH: 24 pg — ABNORMAL LOW (ref 26.0–34.0)
MCHC: 30.7 g/dL (ref 30.0–36.0)
MCV: 77.9 fL — AB (ref 78.0–100.0)
Platelets: 514 10*3/uL — ABNORMAL HIGH (ref 150–400)
RBC: 4.3 MIL/uL (ref 3.87–5.11)
RDW: 16.5 % — AB (ref 11.5–15.5)
WBC: 7.8 10*3/uL (ref 4.0–10.5)

## 2014-05-28 MED ORDER — SODIUM CHLORIDE 0.9 % IV SOLN
INTRAVENOUS | Status: DC
  Administered 2014-05-28: 11:00:00 via INTRAVENOUS

## 2014-05-28 MED ORDER — CEFEPIME HCL 2 G IJ SOLR
2.0000 g | INTRAMUSCULAR | Status: DC
  Administered 2014-05-29 – 2014-05-30 (×2): 2 g via INTRAVENOUS
  Filled 2014-05-28 (×3): qty 2

## 2014-05-28 MED ORDER — IPRATROPIUM-ALBUTEROL 0.5-2.5 (3) MG/3ML IN SOLN
3.0000 mL | Freq: Four times a day (QID) | RESPIRATORY_TRACT | Status: DC
  Administered 2014-05-28 – 2014-05-29 (×4): 3 mL via RESPIRATORY_TRACT
  Filled 2014-05-28 (×5): qty 3

## 2014-05-28 MED ORDER — METHYLPREDNISOLONE SODIUM SUCC 40 MG IJ SOLR
40.0000 mg | Freq: Three times a day (TID) | INTRAMUSCULAR | Status: DC
  Administered 2014-05-28 – 2014-05-29 (×3): 40 mg via INTRAVENOUS
  Filled 2014-05-28 (×6): qty 1

## 2014-05-28 MED ORDER — HYDRALAZINE HCL 20 MG/ML IJ SOLN
5.0000 mg | Freq: Four times a day (QID) | INTRAMUSCULAR | Status: DC | PRN
  Administered 2014-05-30: 5 mg via INTRAVENOUS
  Filled 2014-05-28: qty 1

## 2014-05-28 NOTE — Evaluation (Signed)
Physical Therapy Evaluation Patient Details Name: [name] MRN: [id number] DOB: [birth year] Today's Date: 05/28/2014   History of Present Illness  [name] is a 80 y.o. female with past medical history of bronchiectasis, hypothyroidism and TIA. Patient sent from Dr. Melvyn Novas office because of SOB and altered mental status. Patient is confused so most of the history obtained from the chart, apparently patient lives in assisted living facility, she was not feeling well for the past several days so she evaluated today in her primary pulmonologist office, she was found to be hypoxic, confused and so she was sent to the ER for further evaluation.  Clinical Impression  Pt admitted with confusion and weakness. Pt currently with functional limitations due to the deficits listed below (see PT Problem List). Pt stood edge of bed with mod A but unable to maintain to ambulate due to pelvic pain as well as incontinence of bowel and bladder. Pt will benefit from skilled PT to increase their independence and safety with mobility to allow discharge to the venue listed below. PT will continue to follow.       Follow Up Recommendations SNF;Supervision/Assistance - 24 hour    Equipment Recommendations  None recommended by PT    Recommendations for Other Services       Precautions / Restrictions Precautions Precautions: Fall Precaution Comments: monitor 02 sats Restrictions Other Position/Activity Restrictions: No Weight bearing status found in chart, right pelvic fx in 8/15      Mobility  Bed Mobility Overal bed mobility: Needs Assistance Bed Mobility: Supine to Sit;Sit to Supine;Rolling Rolling: Min assist   Supine to sit: Min assist Sit to supine: Min assist   General bed mobility comments: pt able to initiate rolling and supine to sit but needs min A to complete full mvmt, ie to achieve full SL and then to   Transfers Overall transfer level: Needs assistance Equipment used:  None Transfers: Sit to/from Stand Sit to Stand: Mod assist         General transfer comment: pt stood 2x from bed with therapist in front of pt for maximal support, mod A required to wt shift fwd and for power up. Pt could not maintain standing longer than 5 seconds due to pelvic pain  Ambulation/Gait             General Gait Details: pt incontinent of bowel and bladder in standing so returned to supine for clean up. Pt reports that she has been unable to ambulate since pelvic fx?  Stairs            Wheelchair Mobility    Modified Rankin (Stroke Patients Only)       Balance Overall balance assessment: Needs assistance Sitting-balance support: Feet supported Sitting balance-Leahy Scale: Fair Sitting balance - Comments: supervision   Standing balance support: Bilateral upper extremity supported;During functional activity Standing balance-Leahy Scale: Poor Standing balance comment: has difficulty maintaining standing even with UE support                             Pertinent Vitals/Pain Pain Assessment: Faces Faces Pain Scale: Hurts even more Pain Location: pelvis with movement Pain Descriptors / Indicators: Aching Pain Intervention(s): Limited activity within patient's tolerance;Monitored during session    Home Living Family/patient expects to be discharged to:: Skilled nursing facility                 Additional Comments: pt has been at [facility]  since last hospitalization    Prior Function Level of Independence: Needs assistance   Gait / Transfers Assistance Needed: pt reports that she has been unable to ambulate since pelvic fx but has transferred     Comments: difficult to obtain accurate history due to confusion today. Per admission on 05/13/14, pt reported she was independent     Hand Dominance   Dominant Hand: Right    Extremity/Trunk Assessment   Upper Extremity Assessment: Defer to OT evaluation           Lower Extremity  Assessment: Generalized weakness RLE Deficits / Details: limited due to pelvic pain, able to lift leg against gravity in limited range, painful    Cervical / Trunk Assessment: Normal  Communication   Communication: No difficulties  Cognition Arousal/Alertness: Lethargic Behavior During Therapy: WFL for tasks assessed/performed Overall Cognitive Status: Impaired/Different from baseline Area of Impairment: Orientation;Memory;Problem solving Orientation Level: Disoriented to;Time;Situation;Place   Memory: Decreased short-term memory;Decreased recall of precautions       Problem Solving: Slow processing;Decreased initiation;Difficulty sequencing;Requires verbal cues;Requires tactile cues General Comments: pt confused on eval and did not know where she was other than [facility] Comments      Exercises General Exercises - Lower Extremity Ankle Circles/Pumps: AROM;Both;20 reps;Supine Straight Leg Raises: AROM;Both;10 reps;Supine      Assessment/Plan    PT Assessment Patient needs continued PT services  PT Diagnosis Difficulty walking;Generalized weakness   PT Problem List Decreased strength;Decreased activity tolerance;Decreased balance;Decreased mobility;Decreased knowledge of use of DME;Pain  PT Treatment Interventions DME instruction;Gait training;Functional mobility training;Therapeutic activities;Therapeutic exercise;Patient/family education;Balance training   PT Goals (Current goals can be found in the Care Plan section) Acute Rehab PT Goals Patient Stated Goal: none stated PT Goal Formulation: With patient Time For Goal Achievement: 06/11/14 Potential to Achieve Goals: Good    Frequency Min 2X/week   Barriers to discharge        Co-evaluation               End of Session   Activity Tolerance: Patient limited by fatigue;Patient limited by pain Patient left: in bed;with call bell/phone within reach;with bed alarm set Nurse Communication: Mobility  status         Time: 2831-5176 PT Time Calculation (min): 28 min   Charges:   PT Evaluation $Initial PT Evaluation Tier I: 1 Procedure PT Treatments $Therapeutic Activity: 23-37 mins   PT G Codes:        Leighton Roach, PT  Acute Rehab Services  [place], [place] 05/28/2014, 4:23 PM

## 2014-05-28 NOTE — Progress Notes (Signed)
Note/chart reviewed.  Katie Ralyn Stlaurent, RD, LDN Pager #: [phone] After-Hours Pager #: [phone]  

## 2014-05-28 NOTE — Progress Notes (Signed)
INITIAL NUTRITION ASSESSMENT  Pt meets criteria for SEVERE MALNUTRITION in the context of chronic illness as evidenced by severe fat and muscle mass depletion.  DOCUMENTATION CODES Per approved criteria  -Severe malnutrition in the context of chronic illness   INTERVENTION: Encouraged PO intake.  NUTRITION DIAGNOSIS: Increased nutrient needs related to chronic illness, CHF as evidenced by estimated nutrition needs.   Goal: Pt to meet >/= 90% of their estimated nutrition needs   Monitor:  PO intake, weight trends, labs, I/O's  Reason for Assessment: MST  80 y.o. female  Admitting Dx: HCAP (healthcare-associated pneumonia)  ASSESSMENT: Pt with past medical history of bronchiectasis, HTN, GERD, IBS, hypothyroidism, chronic diastolic CHF, and TIA. Patient with SOB and altered mental status.  Pt reports she has a good appetite currently and back at her assisted living facility. Pt reports she would eat three full meals a day with no difficulties. Pt reports she has not had any weight loss recently. Usual body weight is 120 lbs. Pt reports she would not like any oral supplements at this time as she is eating well. Pt was encouraged to eat her food at meals.   Nutrition Focused Physical Exam:  Subcutaneous Fat:  Orbital Region: N/A Upper Arm Region: WNL Thoracic and Lumbar Region: Severe depletion  Muscle:  Temple Region: WNL Clavicle Bone Region: Moderate depletion Clavicle and Acromion Bone Region: Moderate depletion Scapular Bone Region: N/A Dorsal Hand: Severe depletion Patellar Region: Moderate depletion Anterior Thigh Region: Severe depletion Posterior Calf Region: WNL  Edema: none  Labs: Low sodium, chloride, and calcium.   Height: Ht Readings from Last 1 Encounters:  05/27/14 5\' 2"  (1.575 m)    Weight: Wt Readings from Last 1 Encounters:  05/27/14 120 lb (54.432 kg)    Ideal Body Weight: 110 lbs  % Ideal Body Weight: 109%  Wt Readings from Last 10  Encounters:  05/27/14 120 lb (54.432 kg)  05/22/14 118 lb (53.524 kg)  05/16/14 97 lb (44 kg)  03/11/14 106 lb 3.2 oz (48.172 kg)  03/04/14 105 lb (47.628 kg)  02/10/14 104 lb (47.174 kg)  01/27/14 105 lb (47.628 kg)  10/28/13 105 lb (47.628 kg)  09/16/13 107 lb 12.8 oz (48.898 kg)  08/07/13 106 lb (48.081 kg)    Usual Body Weight: 120 lbs  % Usual Body Weight: 100%  BMI:  Body mass index is 21.94 kg/(m^2).  Estimated Nutritional Needs: Kcal: 1500-1700 Protein: 65- 75 grams Fluid: 1.5-1.7L/day  Skin: no issues noted  Diet Order: General  EDUCATION NEEDS: -No education needs identified at this time   Intake/Output Summary (Last 24 hours) at 05/28/14 0903 Last data filed at 05/27/14 1844  Gross per 24 hour  Intake      0 ml  Output      0 ml  Net      0 ml    Last BM: PTA   Labs:   Recent Labs Lab 05/26/14 05/27/14 1340 05/27/14 1414 05/28/14 0557  NA 126* 129*  --  131*  K 4.0 4.2  --  4.5  CL  --  89*  --  94*  CO2  --  25  --  25  BUN 13 16  --  14  CREATININE 0.8 0.75  --  0.71  CALCIUM  --  9.1  --  7.6*  MG  --   --  2.3  --   PHOS  --   --  3.4  --   GLUCOSE  --  125*  --  112*    CBG (last 3)   Recent Labs  05/27/14 1406  GLUCAP 123*    Scheduled Meds: . bisoprolol  2.5 mg Oral Daily  . [START ON 05/29/2014] ceFEPime (MAXIPIME) IV  2 g Intravenous Q24H  . clopidogrel  75 mg Oral Daily  . guaiFENesin  1,200 mg Oral BID  . heparin  5,000 Units Subcutaneous 3 times per day  . ipratropium-albuterol  3 mL Nebulization QID  . levothyroxine  125 mcg Oral QAC breakfast  . methylPREDNISolone (SOLU-MEDROL) injection  60 mg Intravenous 3 times per day  . pantoprazole  40 mg Oral Daily  . polyethylene glycol  17 g Oral Daily  . vancomycin  500 mg Intravenous Q12H    Continuous Infusions: . sodium chloride 75 mL/hr at 05/28/14 3435    Past Medical History  Diagnosis Date  . Bronchiectasis with acute exacerbation 05/16/14    acute  respiratory failure   . Other and unspecified hyperlipidemia   . TIA (transient ischemic attack)   . Osteoporosis, unspecified   . Asthma   . Esophageal reflux   . IBS (irritable bowel syndrome)   . Diverticulosis   . Hemorrhoids   . Hypothyroidism   . Schatzki's ring   . Hiatal hernia   . Tubular adenoma of colon 12/2008  . Chronic respiratory failure 05/16/14  . HCAP (healthcare-associated pneumonia) 05/16/14  . Sinusitis   . GERD (gastroesophageal reflux disease)   . HTN (hypertension)   . Encephalopathy 05/16/14    noted during hospitalization  . Depressive disorder, not elsewhere classified   . Diastolic dysfunction, left ventricle 05/16/14  . Memory loss   . Pubic ramus fracture   . Hiatal hernia   . Tubular adenoma of colon   . Hypoxia   . Anemia, unspecified   . Constipation     Past Surgical History  Procedure Laterality Date  . Cholecystectomy  1995  . Nasal sinus surgery    . Tonsillectomy    . Foot surgery    . Small intestine surgery  05/2008    Kallie Locks, MS, Provisional LDN Pager # [phone] After hours/ weekend pager # [phone]

## 2014-05-28 NOTE — Progress Notes (Signed)
Pt refused her chest vest and neb. Pt states she wants to rest. Pt in no distress at this time.

## 2014-05-28 NOTE — Progress Notes (Addendum)
Patient Demographics  [name], is a 80 y.o. female, DOB - April 04, 1933, WNU:[id number]  Admit date - 05/27/2014   Admitting Physician Verlee Monte, MD  Outpatient Primary MD for the patient is Christinia Gully, MD  LOS - 1   Chief Complaint  Patient presents with  . Altered Mental Status        Subjective:   [name] today has, No headache, No chest pain, No abdominal pain - No Nausea, No new weakness tingling or numbness, still complains of cough with productive sputum.  Assessment & Plan    Principal Problem:   HCAP (healthcare-associated pneumonia) Active Problems:   HYPOTHYROIDISM   BRONCHIECTASIS W/ACUTE EXACERBATION   Memory loss   Acute encephalopathy   Acute respiratory failure with hypoxia  -Acute on chronic hypoxic respiratory Failure:  On 2-3 L/min at base line, hypoxemic on ABG, improving, currently on 4 L/min. No known history of COPD, most likely related to her bronchiectasis exacerbation.   - BRONCHIECTASIS Exacerbation.  Continue  with IV Vancomycin and Cefepime.   Pulmonary toilet and Chest PT by resp therapy.   Continue with IV steroids, with transition to by mouth prednisone tapering dose back to her baseline of 10 mg by mouth prednisone daily.   Continue with nebulizers.  - acute encephalopathy:    Much improved, most likely related to her hypoxia and Hypercapnia.   - Chronic diastolic CHF:   Patient does not appear to be in any volume overload, appears to be clinically dehydrated, will still continue with gentle hydration and monitor closely.  - Hx of HTN:  Uncontrolled, continue with bisoprolol, will add when necessary IV hydralazine, this is most likely related to steroids.  - History of CVA:   Continue with Plavix.  -History of hypothyroidism:  Continue with Synthroid.  - Protein calorie malnutrition  Appreciate nutrition input, will start on ensure.  Code Status: Full.   Family Communication: Tried to contact family members on record by phone, but no one answered.  Disposition Plan: Skilled nursing facility in 48 hours.   Procedures   None   Consults   PT   Medications  Scheduled Meds: . bisoprolol  2.5 mg Oral Daily  . [START ON 05/29/2014] ceFEPime (MAXIPIME) IV  2 g Intravenous Q24H  . clopidogrel  75 mg Oral Daily  . guaiFENesin  1,200 mg Oral BID  . heparin  5,000 Units Subcutaneous 3 times per day  . ipratropium-albuterol  3 mL Nebulization QID  . levothyroxine  125 mcg Oral QAC breakfast  . methylPREDNISolone (SOLU-MEDROL) injection  60 mg Intravenous 3 times per day  . pantoprazole  40 mg Oral Daily  . polyethylene glycol  17 g Oral Daily  . vancomycin  500 mg Intravenous Q12H   Continuous Infusions: . sodium chloride 75 mL/hr at 05/28/14 0605   PRN Meds:.guaiFENesin-dextromethorphan  DVT Prophylaxis   Heparin - SCDs   Lab Results  Component Value Date   PLT 514* 05/28/2014    Antibiotics    Anti-infectives   Start     Dose/Rate Route Frequency Ordered Stop   05/29/14 0600  ceFEPIme (MAXIPIME) 2 g in dextrose 5 % 50 mL IVPB     2 g 100 mL/hr over 30 Minutes Intravenous  Every 24 hours 05/28/14 0805 06/04/14 0559   05/28/14 0600  vancomycin (VANCOCIN) 500 mg in sodium chloride 0.9 % 100 mL IVPB     500 mg 100 mL/hr over 60 Minutes Intravenous Every 12 hours 05/27/14 1418 06/03/14 2359   05/27/14 2200  ceFEPIme (MAXIPIME) 1 g in dextrose 5 % 50 mL IVPB  Status:  Discontinued     1 g 100 mL/hr over 30 Minutes Intravenous 3 times per day 05/27/14 1655 05/28/14 0806   05/27/14 1430  vancomycin (VANCOCIN) IVPB 1000 mg/200 mL premix     1,000 mg 200 mL/hr over 60 Minutes Intravenous  Once 05/27/14 1416 05/27/14 1554   05/27/14 1415  ceFEPIme (MAXIPIME) 1 g in dextrose 5 % 50 mL IVPB     1 g 100 mL/hr over 30 Minutes Intravenous  Once 05/27/14 1411  05/27/14 1622          Objective:   Filed Vitals:   05/27/14 1717 05/27/14 1735 05/27/14 2212 05/28/14 0600  BP: 151/78  161/137 177/83  Pulse: 85  84 83  Temp:   97.8 F (36.6 C) 98.7 F (37.1 C)  TempSrc:   Oral Oral  Resp: 18  17 16   Height:      Weight:      SpO2: 91% 98% 99% 100%    Wt Readings from Last 3 Encounters:  05/27/14 54.432 kg (120 lb)  05/22/14 53.524 kg (118 lb)  05/16/14 44 kg (97 lb)     Intake/Output Summary (Last 24 hours) at 05/28/14 0856 Last data filed at 05/27/14 1844  Gross per 24 hour  Intake      0 ml  Output      0 ml  Net      0 ml     Physical Exam  Awake Alert, Oriented X 3, No new F.N deficits, Normal affect [place].AT,PERRAL Supple Neck,No JVD, No cervical lymphadenopathy appriciated.  Symmetrical Chest wall movement, Good air movement bilaterally, CTAB RRR,No Gallops,Rubs or new Murmurs, No Parasternal Heave +ve B.Sounds, Abd Soft, No tenderness, No organomegaly appriciated, No rebound - guarding or rigidity. No Cyanosis, Clubbing or edema, No new Rash or bruise     Data Review   Micro Results Recent Results (from the past 240 hour(s))  CULTURE, BLOOD (ROUTINE X 2)     Status: None   Collection Time    05/27/14  2:20 PM      Result Value Ref Range Status   Specimen Description BLOOD HAND RIGHT   Final   Special Requests BOTTLES DRAWN AEROBIC ONLY 4CC   Final   Culture  Setup Time     Final   Value: 05/27/2014 19:13     Performed at [facility]   Culture     Final   Value:        BLOOD CULTURE RECEIVED NO GROWTH TO DATE CULTURE WILL BE HELD FOR 5 DAYS BEFORE ISSUING A FINAL NEGATIVE REPORT     Performed at [facility]   Report Status PENDING   Incomplete  CULTURE, BLOOD (ROUTINE X 2)     Status: None   Collection Time    05/27/14  2:42 PM      Result Value Ref Range Status   Specimen Description BLOOD ARM LEFT   Final   Special Requests BOTTLES DRAWN AEROBIC AND ANAEROBIC 5CC   Final   Culture   Setup Time     Final   Value: 05/27/2014 19:14  Performed at [facility]     Final   Value:        BLOOD CULTURE RECEIVED NO GROWTH TO DATE CULTURE WILL BE HELD FOR 5 DAYS BEFORE ISSUING A FINAL NEGATIVE REPORT     Performed at [facility]   Report Status PENDING   Incomplete    Radiology Reports Dg Chest 1 View  05/27/2014   CLINICAL DATA:  Followup pneumonia.  EXAM: CHEST - 1 VIEW  COMPARISON:  05/15/2014  FINDINGS: Normal heart size. Stable upper mediastinal contours. There is chronic lung disease with pulmonary hyperinflation and coarse reticular nodular opacities at the bases. These findings are stable from previous. The patient has a background of bronchiectasis and micro nodularity, likely chronic indolent infection based on previous CT imaging.  IMPRESSION: Stable appearance of the chest from 05/15/2014. 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   Electronically Signed   By: Jorje Guild M.D.   On: 05/27/2014 11:59   Dg Chest Port 1 View  05/27/2014   CLINICAL DATA:  Altered mental status  EXAM: PORTABLE CHEST - 1 VIEW  COMPARISON:  Portable chest x-ray of May 27, 2014 at 11:45 a.m. and portable chest x-ray of May 15, 2014  FINDINGS: The lungs are adequately inflated. There remain coarse interstitial lung markings in the mid and lower lung zones bilaterally, greater on the right than on the left. The cardiac silhouette is top-normal in size. The pulmonary vascularity is not engorged. The mediastinum is normal in width. The bony thorax is unremarkable.  IMPRESSION: There has not been significant interval change since a study of earlier today. There has been slight interval improvement since the study of May 15, 2014.   Electronically Signed   By: David  Martinique   On: 05/27/2014 14:36    CBC  Recent Labs Lab  05/26/14 05/27/14 1340 05/28/14 0557  WBC 17.0 22.7* 7.8  HGB 10.9* 12.5 10.3*  HCT 32* 39.3 33.5*  PLT 575* 596* 514*  MCV  --  79.2 77.9*  MCH  --  25.2* 24.0*  MCHC  --  31.8 30.7  RDW  --  16.8* 16.5*    Chemistries   Recent Labs Lab 05/26/14 05/27/14 1340 05/27/14 1414 05/28/14 0557  NA 126* 129*  --  131*  K 4.0 4.2  --  4.5  CL  --  89*  --  94*  CO2  --  25  --  25  GLUCOSE  --  125*  --  112*  BUN 13 16  --  14  CREATININE 0.8 0.75  --  0.71  CALCIUM  --  9.1  --  7.6*  MG  --   --  2.3  --   AST 20 29  --   --   ALT 13 17  --   --   ALKPHOS 203* 304*  --   --   BILITOT  --  0.5  --   --    ------------------------------------------------------------------------------------------------------------------ estimated creatinine clearance is 45.8 ml/min (by C-G formula based on Cr of 0.71). ------------------------------------------------------------------------------------------------------------------ No results found for this basename: HGBA1C,  in the last 72 hours ------------------------------------------------------------------------------------------------------------------ No results found for this basename: CHOL, HDL, LDLCALC, TRIG, CHOLHDL, LDLDIRECT,  in the last 72 hours ------------------------------------------------------------------------------------------------------------------ No results found for this basename: TSH, T4TOTAL, FREET3, T3FREE, THYROIDAB,  in the last  72 hours ------------------------------------------------------------------------------------------------------------------ No results found for this basename: VITAMINB12, FOLATE, FERRITIN, TIBC, IRON, RETICCTPCT,  in the last 72 hours  Coagulation profile  Recent Labs Lab 05/27/14 1414  INR 0.96    No results found for this basename: DDIMER,  in the last 72 hours  Cardiac Enzymes No results found for this basename: CK, CKMB, TROPONINI, MYOGLOBIN,  in the last 168  hours ------------------------------------------------------------------------------------------------------------------ No components found with this basename: POCBNP,      Time Spent in minutes   30 minutes   Josiel Gahm M.D on 05/28/2014 at 8:56 AM  Between 7am to 7pm - Pager - [phone]  After 7pm go to [email or web address] - password [email or web address]  And look for the night coverage person covering for me after hours  [facility] Office  [phone]   **Disclaimer: This note may have been dictated with voice recognition software. Similar sounding words can inadvertently be transcribed and this note may contain transcription errors which may not have been corrected upon publication of note.**

## 2014-05-28 NOTE — Telephone Encounter (Signed)
Called spoke with pt sister Bethena Roys. She repots she spoke with TP yesterday. Was told pt would have some test done and keep her updated. She is wanting to speak with TP. Please advise thanks  --she is aware TP not in this afternoon

## 2014-05-28 NOTE — Clinical Social Work Psychosocial (Signed)
Clinical Social Work Department BRIEF PSYCHOSOCIAL ASSESSMENT 05/28/2014  Patient:  [name], [name]     Account Number:  [id number]     Admit date:  05/27/2014  Clinical Social Worker:  Frederico Hamman  Date/Time:  05/28/2014 12:45 PM  Referred by:  Physician  Date Referred:  05/28/2014 Referred for  SNF Placement   Other Referral:   Interview type:  Family Other interview type:    PSYCHOSOCIAL DATA Living Status:  FACILITY Admitted from facility:  [place] Level of care:  [place] Primary support name:  [name] Primary support relationship to patient:  SIBLING Degree of support available:   Sister concerned and knowledgeable about patient's situation. Her phone number [phone].    CURRENT CONCERNS Current Concerns  Post-Acute Placement   Other Concerns:    SOCIAL WORK ASSESSMENT / PLAN CSW went to room and patient was asleep. Per nurse evals patient is only oriented to self. CSW talked with sister regarding discharge plans to confirm return to [facility] at discharge. [name] explained that patient has an apartment at [facility] and hs been there approximately 3 years, but once she became ill she went to [place] (skilled facility). Per sister, the plan is for patient to return to [facility] facility.   Assessment/plan status:  Psychosocial Support/Ongoing Assessment of Needs Other assessment/ plan:   Information/referral to community resources:   None needed or requested at this time.    PATIENT'S/FAMILY'S RESPONSE TO PLAN OF CARE: [name] was pleasant and receptive to talking with CSW about patient and anticipated discharge plans.

## 2014-05-28 NOTE — Progress Notes (Signed)
Chest vest and neb done. Pt had no adverse reaction to therapy.

## 2014-05-29 DIAGNOSIS — J189 Pneumonia, unspecified organism: Principal | ICD-10-CM

## 2014-05-29 LAB — CBC
HCT: 32.8 % — ABNORMAL LOW (ref 36.0–46.0)
HEMOGLOBIN: 10.2 g/dL — AB (ref 12.0–15.0)
MCH: 24.6 pg — ABNORMAL LOW (ref 26.0–34.0)
MCHC: 31.1 g/dL (ref 30.0–36.0)
MCV: 79.2 fL (ref 78.0–100.0)
Platelets: 518 10*3/uL — ABNORMAL HIGH (ref 150–400)
RBC: 4.14 MIL/uL (ref 3.87–5.11)
RDW: 16.7 % — ABNORMAL HIGH (ref 11.5–15.5)
WBC: 8.7 10*3/uL (ref 4.0–10.5)

## 2014-05-29 LAB — BASIC METABOLIC PANEL
Anion gap: 11 (ref 5–15)
BUN: 14 mg/dL (ref 6–23)
CALCIUM: 7.5 mg/dL — AB (ref 8.4–10.5)
CO2: 23 mEq/L (ref 19–32)
Chloride: 99 mEq/L (ref 96–112)
Creatinine, Ser: 0.61 mg/dL (ref 0.50–1.10)
GFR, EST NON AFRICAN AMERICAN: 85 mL/min — AB (ref >90)
GLUCOSE: 108 mg/dL — AB (ref 70–99)
Potassium: 4.6 mEq/L (ref 3.7–5.3)
SODIUM: 133 meq/L — AB (ref 137–147)

## 2014-05-29 MED ORDER — ARFORMOTEROL TARTRATE 15 MCG/2ML IN NEBU
15.0000 ug | INHALATION_SOLUTION | Freq: Two times a day (BID) | RESPIRATORY_TRACT | Status: DC
  Administered 2014-05-29 (×2): 15 ug via RESPIRATORY_TRACT
  Filled 2014-05-29 (×5): qty 2

## 2014-05-29 MED ORDER — METHYLPREDNISOLONE SODIUM SUCC 40 MG IJ SOLR
40.0000 mg | Freq: Two times a day (BID) | INTRAMUSCULAR | Status: DC
  Administered 2014-05-29 (×2): 40 mg via INTRAVENOUS
  Filled 2014-05-29 (×4): qty 1

## 2014-05-29 MED ORDER — LATANOPROST 0.005 % OP SOLN
1.0000 [drp] | Freq: Every day | OPHTHALMIC | Status: DC
  Administered 2014-05-29: 1 [drp] via OPHTHALMIC
  Filled 2014-05-29: qty 2.5

## 2014-05-29 MED ORDER — FLUTICASONE PROPIONATE HFA 44 MCG/ACT IN AERO
2.0000 | INHALATION_SPRAY | Freq: Two times a day (BID) | RESPIRATORY_TRACT | Status: DC
  Filled 2014-05-29: qty 10.6

## 2014-05-29 MED ORDER — SERTRALINE HCL 25 MG PO TABS
25.0000 mg | ORAL_TABLET | Freq: Every day | ORAL | Status: DC
  Administered 2014-05-29 – 2014-05-30 (×2): 25 mg via ORAL
  Filled 2014-05-29 (×2): qty 1

## 2014-05-29 MED ORDER — BUDESONIDE 0.5 MG/2ML IN SUSP
0.5000 mg | Freq: Two times a day (BID) | RESPIRATORY_TRACT | Status: DC
  Administered 2014-05-29 – 2014-05-30 (×3): 0.5 mg via RESPIRATORY_TRACT
  Filled 2014-05-29 (×6): qty 2

## 2014-05-29 MED ORDER — IPRATROPIUM-ALBUTEROL 0.5-2.5 (3) MG/3ML IN SOLN
3.0000 mL | Freq: Three times a day (TID) | RESPIRATORY_TRACT | Status: DC
  Administered 2014-05-29 – 2014-05-30 (×4): 3 mL via RESPIRATORY_TRACT
  Filled 2014-05-29 (×4): qty 3

## 2014-05-29 MED ORDER — NISOLDIPINE ER 8.5 MG PO TB24
8.5000 mg | ORAL_TABLET | Freq: Every day | ORAL | Status: DC
  Administered 2014-05-29 – 2014-05-30 (×2): 8.5 mg via ORAL
  Filled 2014-05-29 (×3): qty 1

## 2014-05-29 NOTE — Plan of Care (Signed)
Problem: Phase II Progression Outcomes Goal: Wean O2 if indicated Outcome: Not Met (add Reason) Patient 78% on room air during ambulation, will require home oxygen.  Case Manager notified

## 2014-05-29 NOTE — Clinical Documentation Improvement (Signed)
Abnormal Lab and/or Testing Results: Component     Latest Ref Rng 05/27/2014 05/28/2014 05/29/2014            Sodium     137 - 147 mEq/L 129 (L) 131 (L) 133 (L)  Potassium     3.7 - 5.3 mEq/L 4.2 4.5 4.6  Chloride     96 - 112 mEq/L 89 (L) 94 (L) 99  Calcium     8.4 - 10.5 mg/dL 9.1 7.6 (L) 7.5 (L)    Treatment provided: IVF: NS @ 75 ml/hr                                   Monitor I&O Possible Clinical Conditions:_____  Hyponatremia                                                _____ Dehydration  Supporting Information:Oral mucosa shows glossitis and slight dryness of the mucous membranes  Heart sounds are distant and tachycardic  Risk Factors: HCAP  Thank You, Philippa Chester ,RN Clinical Documentation Specialist:  [facility]

## 2014-05-29 NOTE — Telephone Encounter (Signed)
Called.

## 2014-05-29 NOTE — Clinical Social Work Note (Signed)
Call made to [place] [place] [facility] and message left regarding patient's anticipated d/c on Friday, 9/4. FL-2 updated for physical therapy and sent to SNF with PT evaluation.  Ayra Hodgdon Givens, MSW, LCSW [phone]

## 2014-05-29 NOTE — Progress Notes (Signed)
Patient educated on pneumonia and heart failure.  Handouts given and explanation given.  Patient verbalized understanding.

## 2014-05-29 NOTE — Progress Notes (Signed)
Patient Demographics  [name], is a 80 y.o. female, DOB - 1932-10-23, WYO:[id number]  Admit date - 05/27/2014   Admitting Physician Verlee Monte, MD  Outpatient Primary MD for the patient is Christinia Gully, MD  LOS - 2   Chief Complaint  Patient presents with  . Altered Mental Status        Subjective:   [name] today has, No headache, No chest pain, No abdominal pain - No Nausea, No new weakness tingling or numbness, still complains of cough with productive sputum, but reports its improving.  Assessment & Plan    Principal Problem:   HCAP (healthcare-associated pneumonia) Active Problems:   HYPOTHYROIDISM   BRONCHIECTASIS W/ACUTE EXACERBATION   Memory loss   Acute encephalopathy   Acute respiratory failure with hypoxia   Protein-calorie malnutrition, severe  -Acute on chronic hypoxic respiratory Failure:  On 2-3 L/min at base line, hypoxemic on ABG, improving. No known history of COPD, most likely related to her bronchiectasis exacerbation.   - BRONCHIECTASIS Exacerbation.  Continue  with IV Vancomycin and Cefepime.   Pulmonary toilet and Chest PT by resp therapy.   Continue to taper IV steroids, with transition to by mouth prednisone tapering dose back to her baseline of 10 mg by mouth prednisone daily.   Continue with nebulizers.   Cough and sputum production are improving.  - acute encephalopathy:    Much improved, most likely related to her hypoxia and Hypercapnia.back to baseline.   - Chronic diastolic CHF:   Patient does not appear to be in any volume overload, appears to be clinically dehydrated, will still continue with gentle hydration and monitor closely.  - Hx of HTN:  Uncontrolled, continue with bisoprolol, will add when necessary IV hydralazine, this is most likely related to steroids.will resume back on Sular.  -  History of CVA:   Continue with Plavix.  -History of hypothyroidism:  Continue with Synthroid.  - Protein calorie malnutrition   Appreciate nutrition input, will start on ensure.  -hponatremia:  due to dehydration , resolved with IV fluids.  Code Status: Full.   Family Communication: D/W sister [name].  Disposition Plan: Skilled nursing facility in am.   Procedures   None   Consults   PT   Medications  Scheduled Meds: . bisoprolol  2.5 mg Oral Daily  . ceFEPime (MAXIPIME) IV  2 g Intravenous Q24H  . clopidogrel  75 mg Oral Daily  . guaiFENesin  1,200 mg Oral BID  . heparin  5,000 Units Subcutaneous 3 times per day  . ipratropium-albuterol  3 mL Nebulization TID  . levothyroxine  125 mcg Oral QAC breakfast  . methylPREDNISolone (SOLU-MEDROL) injection  40 mg Intravenous 3 times per day  . pantoprazole  40 mg Oral Daily  . polyethylene glycol  17 g Oral Daily  . vancomycin  500 mg Intravenous Q12H   Continuous Infusions: . sodium chloride 75 mL/hr at 05/28/14 0605  . sodium chloride 50 mL/hr at 05/28/14 1044   PRN Meds:.guaiFENesin-dextromethorphan, hydrALAZINE  DVT Prophylaxis   Heparin - SCDs   Lab Results  Component Value Date   PLT 518* 05/29/2014    Antibiotics    Anti-infectives   Start     Dose/Rate Route Frequency  Ordered Stop   05/29/14 0600  ceFEPIme (MAXIPIME) 2 g in dextrose 5 % 50 mL IVPB     2 g 100 mL/hr over 30 Minutes Intravenous Every 24 hours 05/28/14 0805 06/04/14 0559   05/28/14 0600  vancomycin (VANCOCIN) 500 mg in sodium chloride 0.9 % 100 mL IVPB     500 mg 100 mL/hr over 60 Minutes Intravenous Every 12 hours 05/27/14 1418 06/03/14 2359   05/27/14 2200  ceFEPIme (MAXIPIME) 1 g in dextrose 5 % 50 mL IVPB  Status:  Discontinued     1 g 100 mL/hr over 30 Minutes Intravenous 3 times per day 05/27/14 1655 05/28/14 0806   05/27/14 1430  vancomycin (VANCOCIN) IVPB 1000 mg/200 mL premix     1,000 mg 200 mL/hr over 60 Minutes  Intravenous  Once 05/27/14 1416 05/27/14 1554   05/27/14 1415  ceFEPIme (MAXIPIME) 1 g in dextrose 5 % 50 mL IVPB     1 g 100 mL/hr over 30 Minutes Intravenous  Once 05/27/14 1411 05/27/14 1622          Objective:   Filed Vitals:   05/28/14 1852 05/28/14 2008 05/28/14 2210 05/29/14 0417  BP: 149/65  153/75 181/84  Pulse: 76  75 77  Temp: 98.5 F (36.9 C)  97.6 F (36.4 C) 97.9 F (36.6 C)  TempSrc: Oral  Oral Oral  Resp: 18  18 16   Height:      Weight:      SpO2: 99% 99% 99% 100%    Wt Readings from Last 3 Encounters:  05/27/14 54.432 kg (120 lb)  05/22/14 53.524 kg (118 lb)  05/16/14 44 kg (97 lb)     Intake/Output Summary (Last 24 hours) at 05/29/14 0907 Last data filed at 05/29/14 0500  Gross per 24 hour  Intake 1568.33 ml  Output      0 ml  Net 1568.33 ml     Physical Exam  Awake Alert, Oriented X 3, No new F.N deficits, Normal affect [place].AT,PERRALA, lips still dry and slightly cracked, but much improved than the last couple days. Supple Neck,No JVD, No cervical lymphadenopathy appriciated.  Symmetrical Chest wall movement, Good air movement bilaterally, CTAB RRR,No Gallops,Rubs or new Murmurs, No Parasternal Heave +ve B.Sounds, Abd Soft, No tenderness, No organomegaly appriciated, No rebound - guarding or rigidity. No Cyanosis, Clubbing or edema, No new Rash or bruise     Data Review   Micro Results Recent Results (from the past 240 hour(s))  CULTURE, BLOOD (ROUTINE X 2)     Status: None   Collection Time    05/27/14  2:20 PM      Result Value Ref Range Status   Specimen Description BLOOD HAND RIGHT   Final   Special Requests BOTTLES DRAWN AEROBIC ONLY 4CC   Final   Culture  Setup Time     Final   Value: 05/27/2014 19:13     Performed at [facility]   Culture     Final   Value:        BLOOD CULTURE RECEIVED NO GROWTH TO DATE CULTURE WILL BE HELD FOR 5 DAYS BEFORE ISSUING A FINAL NEGATIVE REPORT     Performed at [facility]    Report Status PENDING   Incomplete  CULTURE, BLOOD (ROUTINE X 2)     Status: None   Collection Time    05/27/14  2:42 PM      Result Value Ref Range Status   Specimen Description BLOOD  ARM LEFT   Final   Special Requests BOTTLES DRAWN AEROBIC AND ANAEROBIC 5CC   Final   Culture  Setup Time     Final   Value: 05/27/2014 19:14     Performed at [facility]   Culture     Final   Value:        BLOOD CULTURE RECEIVED NO GROWTH TO DATE CULTURE WILL BE HELD FOR 5 DAYS BEFORE ISSUING A FINAL NEGATIVE REPORT     Performed at [facility]   Report Status PENDING   Incomplete    Radiology Reports Dg Chest 1 View  05/27/2014   CLINICAL DATA:  Followup pneumonia.  EXAM: CHEST - 1 VIEW  COMPARISON:  05/15/2014  FINDINGS: Normal heart size. Stable upper mediastinal contours. There is chronic lung disease with pulmonary hyperinflation and coarse reticular nodular opacities at the bases. These findings are stable from previous. The patient has a background of bronchiectasis and micro nodularity, likely chronic indolent infection based on previous CT imaging.  IMPRESSION: Stable appearance of the chest from 05/15/2014. 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   Electronically Signed   By: Jorje Guild M.D.   On: 05/27/2014 11:59   Dg Chest Port 1 View  05/27/2014   CLINICAL DATA:  Altered mental status  EXAM: PORTABLE CHEST - 1 VIEW  COMPARISON:  Portable chest x-ray of May 27, 2014 at 11:45 a.m. and portable chest x-ray of May 15, 2014  FINDINGS: The lungs are adequately inflated. There remain coarse interstitial lung markings in the mid and lower lung zones bilaterally, greater on the right than on the left. The cardiac silhouette is top-normal in size. The pulmonary vascularity is not engorged. The mediastinum is normal in width. The bony thorax is  unremarkable.  IMPRESSION: There has not been significant interval change since a study of earlier today. There has been slight interval improvement since the study of May 15, 2014.   Electronically Signed   By: David  Martinique   On: 05/27/2014 14:36    CBC  Recent Labs Lab 05/26/14 05/27/14 1340 05/28/14 0557 05/29/14 0520  WBC 17.0 22.7* 7.8 8.7  HGB 10.9* 12.5 10.3* 10.2*  HCT 32* 39.3 33.5* 32.8*  PLT 575* 596* 514* 518*  MCV  --  79.2 77.9* 79.2  MCH  --  25.2* 24.0* 24.6*  MCHC  --  31.8 30.7 31.1  RDW  --  16.8* 16.5* 16.7*    Chemistries   Recent Labs Lab 05/26/14 05/27/14 1340 05/27/14 1414 05/28/14 0557 05/29/14 0520  NA 126* 129*  --  131* 133*  K 4.0 4.2  --  4.5 4.6  CL  --  89*  --  94* 99  CO2  --  25  --  25 23  GLUCOSE  --  125*  --  112* 108*  BUN 13 16  --  14 14  CREATININE 0.8 0.75  --  0.71 0.61  CALCIUM  --  9.1  --  7.6* 7.5*  MG  --   --  2.3  --   --   AST 20 29  --   --   --   ALT 13 17  --   --   --   ALKPHOS 203* 304*  --   --   --   BILITOT  --  0.5  --   --   --    ------------------------------------------------------------------------------------------------------------------  estimated creatinine clearance is 45.8 ml/min (by C-G formula based on Cr of 0.61). ------------------------------------------------------------------------------------------------------------------ No results found for this basename: HGBA1C,  in the last 72 hours ------------------------------------------------------------------------------------------------------------------ No results found for this basename: CHOL, HDL, LDLCALC, TRIG, CHOLHDL, LDLDIRECT,  in the last 72 hours ------------------------------------------------------------------------------------------------------------------ No results found for this basename: TSH, T4TOTAL, FREET3, T3FREE, THYROIDAB,  in the last 72  hours ------------------------------------------------------------------------------------------------------------------ No results found for this basename: VITAMINB12, FOLATE, FERRITIN, TIBC, IRON, RETICCTPCT,  in the last 72 hours  Coagulation profile  Recent Labs Lab 05/27/14 1414  INR 0.96    No results found for this basename: DDIMER,  in the last 72 hours  Cardiac Enzymes No results found for this basename: CK, CKMB, TROPONINI, MYOGLOBIN,  in the last 168 hours ------------------------------------------------------------------------------------------------------------------ No components found with this basename: POCBNP,      Time Spent in minutes   30 minutes   Judeth Gilles M.D on 05/29/2014 at 9:07 AM  Between 7am to 7pm - Pager - [phone]  After 7pm go to [email or web address] - password [email or web address]  And look for the night coverage person covering for me after hours  [facility] Office  [phone]   **Disclaimer: This note may have been dictated with voice recognition software. Similar sounding words can inadvertently be transcribed and this note may contain transcription errors which may not have been corrected upon publication of note.**

## 2014-05-30 MED ORDER — ALPRAZOLAM 0.25 MG PO TABS: 0.2500 mg | ORAL_TABLET | Freq: Two times a day (BID) | ORAL | Status: DC | PRN

## 2014-05-30 MED ORDER — HYDRALAZINE HCL 25 MG PO TABS
25.0000 mg | ORAL_TABLET | Freq: Three times a day (TID) | ORAL | Status: DC
  Administered 2014-05-30 (×2): 25 mg via ORAL
  Filled 2014-05-30 (×4): qty 1

## 2014-05-30 MED ORDER — MUPIROCIN 2 % EX OINT
1.0000 "application " | TOPICAL_OINTMENT | Freq: Two times a day (BID) | CUTANEOUS | Status: DC
  Administered 2014-05-30: 1 via NASAL
  Filled 2014-05-30: qty 22

## 2014-05-30 MED ORDER — LANSOPRAZOLE 15 MG PO CPDR: 15.0000 mg | DELAYED_RELEASE_CAPSULE | Freq: Every day | ORAL | Status: DC

## 2014-05-30 MED ORDER — LEVOFLOXACIN 500 MG PO TABS: 500.0000 mg | ORAL_TABLET | Freq: Every day | ORAL | Status: AC

## 2014-05-30 MED ORDER — CHLORHEXIDINE GLUCONATE CLOTH 2 % EX PADS: 6.0000 | MEDICATED_PAD | Freq: Every day | CUTANEOUS | Status: DC

## 2014-05-30 MED ORDER — TOBRAMYCIN 300 MG/5ML IN NEBU: 300.0000 mg | INHALATION_SOLUTION | Freq: Two times a day (BID) | RESPIRATORY_TRACT | Status: DC

## 2014-05-30 NOTE — Progress Notes (Signed)
PT Cancellation Note  Patient Details Name: YOLANDE SKODA MRN: [id number] DOB: [birth year]   Cancelled Treatment:    Reason Eval/Treat Not Completed: Patient declined, no reason specified.  Pt to be transferred back to [facility] this afternoon and she declined gait, therex, and getting up to recliner with PT.   Shawnta Schlegel LUBECK 05/30/2014, 3:00 PM

## 2014-05-30 NOTE — Progress Notes (Signed)
Nursing report called to [facility] to call for transportation.  Joellen Jersey, RN.

## 2014-05-30 NOTE — Discharge Instructions (Signed)
Follow with Primary MD Christinia Gully, MD in 7 days   Get CBC, CMP, 2 view Chest X ray checked  by Primary MD next visit.    Activity: As tolerated with Full fall precautions use walker/cane & assistance as needed   Disposition SNF   Diet: Heart Healthy , feeding assistance and aspiration precautions if needed.  For Heart failure patients - Check your Weight same time everyday, if you gain over 2 pounds, or you develop in leg swelling, experience more shortness of breath or chest pain, call your Primary MD immediately. Follow Cardiac Low Salt Diet and 1.8 lit/day fluid restriction.   On your next visit with her primary care physician please Get Medicines reviewed and adjusted.  Please request your Prim.MD to go over all Hospital Tests and Procedure/Radiological results at the follow up, please get all Hospital records sent to your Prim MD by signing hospital release before you go home.   If you experience worsening of your admission symptoms, develop shortness of breath, life threatening emergency, suicidal or homicidal thoughts you must seek medical attention immediately by calling 911 or calling your MD immediately  if symptoms less severe.  You Must read complete instructions/literature along with all the possible adverse reactions/side effects for all the Medicines you take and that have been prescribed to you. Take any new Medicines after you have completely understood and accpet all the possible adverse reactions/side effects.   Do not drive, operating heavy machinery, perform activities at heights, swimming or participation in water activities or provide baby sitting services if your were admitted for syncope or siezures until you have seen by Primary MD or a Neurologist and advised to do so again.  Do not drive when taking Pain medications.    Do not take more than prescribed Pain, Sleep and Anxiety Medications  Special Instructions: If you have smoked or chewed Tobacco  in the  last 2 yrs please stop smoking, stop any regular Alcohol  and or any Recreational drug use.  Wear Seat belts while driving.   Please note  You were cared for by a hospitalist during your hospital stay. If you have any questions about your discharge medications or the care you received while you were in the hospital after you are discharged, you can call the unit and asked to speak with the hospitalist on call if the hospitalist that took care of you is not available. Once you are discharged, your primary care physician will handle any further medical issues. Please note that NO REFILLS for any discharge medications will be authorized once you are discharged, as it is imperative that you return to your primary care physician (or establish a relationship with a primary care physician if you do not have one) for your aftercare needs so that they can reassess your need for medications and monitor your lab values.

## 2014-05-30 NOTE — Progress Notes (Signed)
Patient's PIV infiltrated while IV Vancomycin was infusing. Swelling and ecchymosis noted at site. PIV removed. IV team notified and stated they were unable to restart a PIV and recommended a PICC line. Dr. Waldron [facility] notified who stated it was ok to leave PIV out as patient is being discharged later today. Will monitor.  Joellen Jersey, RN.

## 2014-05-30 NOTE — Care Management Note (Signed)
CARE MANAGEMENT NOTE 05/30/2014  Patient:  [name], [name]   Account Number:  [id number]  Date Initiated:  05/30/2014  Documentation initiated by:  Khamya Topp  Subjective/Objective Assessment:   CM following for progression and d/c planning.     Action/Plan:   Pt is SNF resident and will [facility].   Anticipated DC Date:  05/30/2014   Anticipated DC Plan:  SKILLED NURSING FACILITY         Choice offered to / List presented to:             Status of service:  Completed, signed off Medicare Important Message given?  YES (If response is "NO", the following Medicare IM given date fields will be blank) Date Medicare IM given:  05/30/2014 Medicare IM given by:  Vidit Boissonneault Date Additional Medicare IM given:   Additional Medicare IM given by:    Discharge Disposition:  [facility]  Per UR Regulation:    If discussed at Long Length of Stay Meetings, dates discussed:    Comments:

## 2014-05-30 NOTE — Clinical Social Work Note (Signed)
Patient medically stable for discharge back to [facility] skilled facility. Discharge information transmitted to facility and patient will be transported by ambulance. Family contacted and informed of discharge.  Jossalyn Forgione Givens, MSW, LCSW [phone]

## 2014-05-30 NOTE — Plan of Care (Signed)
Problem: Discharge Progression Outcomes Goal: Vaccine documented on D/C instructions Outcome: Completed/Met Date Met:  05/30/14 Received vaccine in 2009 and 2015.

## 2014-05-30 NOTE — Discharge Summary (Signed)
[name], 80 y.o., DOB December 25, 1932, MRN [id number]. Admission date: 05/27/2014 Discharge Date 05/30/2014 Primary MD Christinia Gully, MD Admitting Physician Verlee Monte, MD  Admission Diagnosis  Bronchiectasis with acute exacerbation [494.1] HCAP (healthcare-associated pneumonia) [486]  Discharge Diagnosis   Principal Problem:   HCAP (healthcare-associated pneumonia) Active Problems:   HYPOTHYROIDISM   BRONCHIECTASIS W/ACUTE EXACERBATION   Memory loss   Acute encephalopathy   Acute respiratory failure with hypoxia   Protein-calorie malnutrition, severe     Past Medical History  Diagnosis Date  . Bronchiectasis with acute exacerbation 05/16/14    acute respiratory failure   . Other and unspecified hyperlipidemia   . TIA (transient ischemic attack)   . Osteoporosis, unspecified   . Asthma   . Esophageal reflux   . IBS (irritable bowel syndrome)   . Diverticulosis   . Hemorrhoids   . Hypothyroidism   . Schatzki's ring   . Hiatal hernia   . Tubular adenoma of colon 12/2008  . Chronic respiratory failure 05/16/14  . HCAP (healthcare-associated pneumonia) 05/16/14  . Sinusitis   . GERD (gastroesophageal reflux disease)   . HTN (hypertension)   . Encephalopathy 05/16/14    noted during hospitalization  . Depressive disorder, not elsewhere classified   . Diastolic dysfunction, left ventricle 05/16/14  . Memory loss   . Pubic ramus fracture   . Hiatal hernia   . Tubular adenoma of colon   . Hypoxia   . Anemia, unspecified   . Constipation     Past Surgical History  Procedure Laterality Date  . Cholecystectomy  1995  . Nasal sinus surgery    . Tonsillectomy    . Foot surgery    . Small intestine surgery  05/2008   HPI: [name] is a 80 y.o. female with past medical history of bronchiectasis, hypothyroidism and TIA. Patient sent from Dr. Melvyn Novas office because of SOB and altered mental status. Patient is confused so most of the history obtained from the chart, apparently  patient lives in assisted living facility, she was not feeling well for the past several days so she evaluated today in her primary pulmonologist office, she was found to be hypoxic, confused and so she was sent to the ER for further evaluation.  In the ED chest x-ray showed no acute changes with coarse interstitial lung markings in the mid and lower lung zones bilaterally. WBC 22.7, sodium of 129, proBNP 528 and lactic acid of 1. Patient admitted to the hospital for further evaluation.   Hospital Course See H&P, Labs, Consult and Test reports for all details in brief,   -Acute on chronic hypoxic respiratory Failure:  On 2-3 L/min at base line, hypoxemic on ABG, improving.  No known history of COPD, most likely related to her bronchiectasis exacerbation.   - BRONCHIECTASIS Exacerbation.  Treated with IV Vancomycin and Cefepime, will be discharged on by mouth levofloxacin total of 5 days Pulmonary toilet and Chest PT by resp therapy.  Tapered her  IV steroids, will be discharged on her baseline of 10 mg by mouth prednisone daily.  Continue with nebulizers.  Cough and sputum production are improving.   - acute encephalopathy:  Much improved, most likely related to her hypoxia and Hypercapnia.back to baseline.   - Chronic diastolic CHF:  appears to be clinically dehydrated upon admission, was appropriately hydrated with IV fluid, does not appear to be in any volume overload upon discharge  - Hx of HTN:  Initially uncontrolled Uncontrolled, continue with bisoprolol,  this is most likely related to steroids resumed Sular.  - History of CVA:  Continue with Plavix.  -History of hypothyroidism:  Continue with Synthroid.   -hponatremia:  due to dehydration , resolved with IV fluids.  Principal Problem:   HCAP (healthcare-associated pneumonia) Active Problems:   HYPOTHYROIDISM   BRONCHIECTASIS W/ACUTE EXACERBATION   Memory loss   Acute encephalopathy   Acute respiratory failure with  hypoxia   Protein-calorie malnutrition, severe      Significant Tests:  See full reports for all details    Dg Chest 1 View  05/27/2014   CLINICAL DATA:  Followup pneumonia.  EXAM: CHEST - 1 VIEW  COMPARISON:  05/15/2014  FINDINGS: Normal heart size. Stable upper mediastinal contours. There is chronic lung disease with pulmonary hyperinflation and coarse reticular nodular opacities at the bases. These findings are stable from previous. The patient has a background of bronchiectasis and micro nodularity, likely chronic indolent infection based on previous CT imaging.  IMPRESSION: Stable appearance of the chest from 05/15/2014. 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   Electronically Signed   By: Jorje Guild M.D.   On: 05/27/2014 11:59   Dg Chest 1 View  05/11/2014   CLINICAL DATA:  Fall, shortness of breath.  EXAM: CHEST - 1 VIEW  COMPARISON:  CT of the chest August 13, 2013 and chest radiograph August 07, 2013  FINDINGS: Increased lung volumes, diffuse reticular nodular densities, slightly decreased on the right corresponding to known bronchiectasis. No pleural effusions or focal consolidations. No pneumothorax  The cardiac silhouette appears mildly enlarged, mediastinal silhouette is nonsuspicious.  Chronic right humeral surgical neck fracture.  IMPRESSION: Mild cardiomegaly.  Diffuse chronic change of the lungs corresponding to known bronchiectasis, and mucoid impaction, slightly improved on the right.   Electronically Signed   By: Elon Alas   On: 05/11/2014 04:42   Dg Hip Complete Right  05/11/2014   CLINICAL DATA:  Fall, pain.  EXAM: RIGHT HIP - COMPLETE 2+ VIEW  COMPARISON:  None.  FINDINGS: Mildly displaced right pubic symphysis fracture extending to the superior pubic ramus appears acute. Chronic appearing left superior pubic symphysis  fracture. Femoral heads are well formed and located. Hip joint spaces are intact. Moderate to severe right hip osteoarthrosis. Sacroiliac joints are symmetric.  No destructive bony lesions. Included soft tissue planes are non-suspicious. Moderate vascular calcifications. Calcification in the right pelvis likely reflects involuted leiomyoma. Gluteal injection granulomas.  IMPRESSION: Acute mildly displaced right pubic symphyseal fracture extending to superior pubic ramus.  No hip fracture deformity nor dislocation.   Electronically Signed   By: Elon Alas   On: 05/11/2014 04:39   Ct Angio Chest Pe W/cm &/or Wo Cm  05/12/2014   CLINICAL DATA:  Elevated D-dimer, hypoxia. Fall with pubic fracture.  EXAM: CT ANGIOGRAPHY CHEST WITH CONTRAST  TECHNIQUE: Multidetector CT imaging of the chest was performed using the standard protocol during bolus administration of intravenous contrast. Multiplanar CT image reconstructions and MIPs were obtained to evaluate the vascular anatomy.  CONTRAST:  138mL OMNIPAQUE IOHEXOL 300 MG/ML  SOLN  COMPARISON:  08/13/2013.  FINDINGS: Image quality in the lung bases is markedly degraded by respiratory motion, limiting evaluation for pulmonary emboli in the lower lung zones. No upper or midlung zone pulmonary embolus.  Mediastinal lymph nodes measure up to 10 mm in the AP window. Bi hilar lymph nodes  measure up to 1.4 cm on the right. No axillary adenopathy. There are scattered calcified mediastinal and hilar lymph nodes. Heart is at the upper limits of normal in size. No pericardial effusion.  Patchy perihilar ground-glass with scattered bronchiectasis and peribronchovascular nodularity. Acute areas of airspace consolidation are seen in the left lower lobe. No pleural fluid. Debris is seen in the bronchus intermedius. Airway is otherwise unremarkable.  Incidental imaging of the upper abdomen shows a 9 mm low-attenuation lesion in the liver, unchanged and likely a cyst or hemangioma.  Adrenal glands are unremarkable. Low-attenuation lesion in the right kidney measures 3.0 cm, similar to the prior exam but incompletely imaged on today's exam. Visualized portions of the spleen, pancreas and stomach are grossly unremarkable. No worrisome lytic or sclerotic lesions.  Review of the MIP images confirms the above findings.  IMPRESSION: 1. Assessment for pulmonary emboli in the lower lung zones is limited by severe respiratory motion. Otherwise, no upper or mid lung zone pulmonary embolus. 2. Patchy areas of airspace consolidation in the left lower lobe are new and indicative of pneumonia. However, followup to clearing is recommended to exclude underlying malignancy. 3. 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   Electronically Signed   By: Lorin Picket M.D.   On: 05/12/2014 12:05   Dg Chest Port 1 View  05/27/2014   CLINICAL DATA:  Altered mental status  EXAM: PORTABLE CHEST - 1 VIEW  COMPARISON:  Portable chest x-ray of May 27, 2014 at 11:45 a.m. and portable chest x-ray of May 15, 2014  FINDINGS: The lungs are adequately inflated. There remain coarse interstitial lung markings in the mid and lower lung zones bilaterally, greater on the right than on the left. The cardiac silhouette is top-normal in size. The pulmonary vascularity is not engorged. The mediastinum is normal in width. The bony thorax is unremarkable.  IMPRESSION: There has not been significant interval change since a study of earlier today. There has been slight interval improvement since the study of May 15, 2014.   Electronically Signed   By: David  Martinique   On: 05/27/2014 14:36   Dg Chest Portable 1 View  05/15/2014   CLINICAL DATA:  Productive cough.  Fever.  EXAM: PORTABLE CHEST - 1 VIEW  COMPARISON:  05/14/2014.  Chest CT, 08/13/2013.  FINDINGS:  Cardiac silhouette is normal in size. No mediastinal or hilar masses.  There are ill-defined interstitial densities in the lung bases, right greater than left, and in the right mid lung. This reflects a combination of bronchiectasis, peribronchial interstitial thickening and mucus/ fluid filled bronchi. It is unchanged from the most recent prior study.  No pleural effusion or pneumothorax.  Bony thorax is demineralized but grossly intact.  IMPRESSION: 1. No change from the previous day's study. 2. Lung findings consistent with combination of bronchiectasis, peribronchial interstitial thickening and mucus/fluid filled bronchi, most evident in the lung bases, right greater than left. Although findings may all be chronic, acute bronchitis is not excluded.   Electronically Signed   By: Lajean Manes M.D.   On: 05/15/2014 14:05   Dg Chest Port 1 View  05/14/2014   CLINICAL DATA:  Followup left base consolidation  EXAM: PORTABLE CHEST - 1 VIEW  COMPARISON:  Portable chest x-ray of 05/13/2014  FINDINGS: There are prominent markings at both lung bases in this patient with emphysema suspicious for pneumonia. Small effusions cannot be excluded.  Cardiomegaly is stable.  IMPRESSION: Basilar opacities consistent with atelectasis or pneumonia. Cannot exclude small effusions in patient with emphysema.   Electronically Signed   By: Ivar Drape M.D.   On: 05/14/2014 08:03   Dg Chest Port 1 View  05/13/2014   CLINICAL DATA:  Infiltrate  EXAM: PORTABLE CHEST - 1 VIEW  COMPARISON:  Chest radiograph and chest CT May 12, 2014  FINDINGS: There is persistent airspace consolidation left lower lobe. There is widespread nodular interstitial thickening with diffuse bronchiectasis, stable. No new opacity is seen. Heart size is within normal limits. Pulmonary vascular is normal. Note that there is underlying emphysema. There is evidence of a prior fracture of the proximal right humeral metaphysis, stable.  IMPRESSION: No change from 1  day prior. Underlying emphysema with left base airspace consolidation as well as widespread bronchiectatic change and nodular interstitial thickening. These are findings that may be seen with chronic atypical pneumonitis with Mycobacterium avium complex a significant differential consideration.   Electronically Signed   By: Lowella Grip M.D.   On: 05/13/2014 07:39   Dg Chest Port 1 View  05/12/2014   CLINICAL DATA:  Pneumonia  EXAM: PORTABLE CHEST - 1 VIEW  COMPARISON:  05/11/2014  FINDINGS: Increasingly dense left basilar lung opacity. Background chronic lung disease with hyperinflation and coarse interstitial opacities in this patient with chronic indolent infection and bronchiectasis on chest CT 08/13/2013. Multiple interfaces over the apical lungs, not convincing for pneumothorax. Remote right humeral neck fracture with healed angulation.  IMPRESSION: 1. Increasingly dense left lower lobe pneumonia. 2. Background chronic lung infection (likely MAI) with bronchiectasis.   Electronically Signed   By: Jorje Guild M.D.   On: 05/12/2014 07:01   Dg Chest Portable 1 View  05/11/2014   CLINICAL DATA:  Respiratory distress, history bronchiectasis,  EXAM: PORTABLE CHEST - 1 VIEW  COMPARISON:  Portable exam 1140 hr compared earlier study of 02/04 0421 hr and correlated with prior CT chest 08/13/2013  FINDINGS: Upper normal heart size.  Normal mediastinal contours.  Diffuse chronic reticulonodular infiltrates throughout both lungs greatest at bases.  Scattered peribronchial thickening with known bronchiectasis in lower lobes particularly RIGHT.  Bibasilar atelectasis versus infiltrate greater on RIGHT.  No pleural effusion or pneumothorax.  Bones demineralized with posttraumatic deformity of proximal RIGHT humerus.  IMPRESSION: Chronic peribronchial thickening and interstitial lung disease changes compatible with chronic bronchitis and known bronchiectasis.  Superimposed bibasilar atelectasis versus  infiltrate greater on RIGHT.   Electronically Signed   By: Lavonia Dana M.D.   On: 05/11/2014 11:54     Today   Subjective:   [name] today has no headache,no chest abdominal pain,no new weakness tingling or numbness, feels much better today.  Objective:   Blood pressure 143/52, pulse 97, temperature 98.1 F (36.7 C), temperature source Oral, resp. rate 16, height 5\' 2"  (1.575 m), weight 52 kg (114 lb 10.2 oz), SpO2 96.00%.  Intake/Output Summary (Last 24 hours) at 05/30/14 1250 Last data filed at 05/30/14 1000  Gross per 24 hour  Intake    640 ml  Output      0 ml  Net    640 ml    Exam Awake Alert, Oriented *3, No new F.N deficits, Normal affect [place].AT,PERRAL Supple Neck,No JVD, No cervical lymphadenopathy appriciated.  Symmetrical Chest wall movement, Good air movement bilaterally, CTAB RRR,No Gallops,Rubs or new Murmurs, No Parasternal Heave +ve B.Sounds, Abd Soft, Non tender, No organomegaly appriciated, No rebound -guarding or rigidity. No  Cyanosis, Clubbing or edema, No new Rash or bruise  Data Review     CBC w Diff: Lab Results  Component Value Date   WBC 8.7 05/29/2014   WBC 17.0 05/26/2014   HGB 10.2* 05/29/2014   HCT 32.8* 05/29/2014   PLT 518* 05/29/2014   LYMPHOPCT 6* 05/14/2014   MONOPCT 9 05/14/2014   EOSPCT 0 05/14/2014   BASOPCT 0 05/14/2014   CMP: Lab Results  Component Value Date   NA 133* 05/29/2014   NA 126* 05/26/2014   K 4.6 05/29/2014   CL 99 05/29/2014   CO2 23 05/29/2014   BUN 14 05/29/2014   BUN 13 05/26/2014   CREATININE 0.61 05/29/2014   CREATININE 0.8 05/26/2014   GLU 104 05/26/2014   PROT 7.4 05/27/2014   ALBUMIN 3.3* 05/27/2014   BILITOT 0.5 05/27/2014   ALKPHOS 304* 05/27/2014   AST 29 05/27/2014   ALT 17 05/27/2014  .  Micro Results Recent Results (from the past 240 hour(s))  CULTURE, BLOOD (ROUTINE X 2)     Status: None   Collection Time    05/27/14  2:20 PM      Result Value Ref Range Status   Specimen Description BLOOD HAND RIGHT   Final    Special Requests BOTTLES DRAWN AEROBIC ONLY 4CC   Final   Culture  Setup Time     Final   Value: 05/27/2014 19:13     Performed at [facility]   Culture     Final   Value:        BLOOD CULTURE RECEIVED NO GROWTH TO DATE CULTURE WILL BE HELD FOR 5 DAYS BEFORE ISSUING A FINAL NEGATIVE REPORT     Performed at [facility]   Report Status PENDING   Incomplete  CULTURE, BLOOD (ROUTINE X 2)     Status: None   Collection Time    05/27/14  2:42 PM      Result Value Ref Range Status   Specimen Description BLOOD ARM LEFT   Final   Special Requests BOTTLES DRAWN AEROBIC AND ANAEROBIC 5CC   Final   Culture  Setup Time     Final   Value: 05/27/2014 19:14     Performed at [facility]   Culture     Final   Value:        BLOOD CULTURE RECEIVED NO GROWTH TO DATE CULTURE WILL BE HELD FOR 5 DAYS BEFORE ISSUING A FINAL NEGATIVE REPORT     Performed at [facility]   Report Status PENDING   Incomplete     Discharge Instructions      Follow-up Information   Follow up with Christinia Gully, MD. Schedule an appointment as soon as possible for a visit in 1 week.   Specialty:  Pulmonary Disease   Contact information:   [address] [place] [address] [phone]       Discharge Medications     Medication List         albuterol (2.5 MG/3ML) 0.083% nebulizer solution  Commonly known as:  PROVENTIL  Take 2.5 mg by nebulization every 3 (three) hours as needed for wheezing or shortness of breath.     ALPRAZolam 0.25 MG tablet  Commonly known as:  XANAX  Take 1 tablet (0.25 mg total) by mouth 2 (two) times daily as needed for anxiety.     arformoterol 15 MCG/2ML Nebu  Commonly known as:  BROVANA  Take 15 mcg by nebulization  every 12 (twelve) hours.     bisoprolol 5 MG tablet  Commonly known as:  ZEBETA  Take 2.5 mg by mouth every morning.     budesonide 0.5 MG/2ML nebulizer solution  Commonly known as:  PULMICORT  Take 0.5 mg by nebulization 2 (two)  times daily.     calcium-vitamin D 500-200 MG-UNIT per tablet  Commonly known as:  OSCAL WITH D  Take 1 tablet by mouth 2 (two) times daily.     cetirizine 10 MG tablet  Commonly known as:  ZYRTEC  Take 10 mg by mouth at bedtime.     clopidogrel 75 MG tablet  Commonly known as:  PLAVIX  Take 75 mg by mouth every morning.     famotidine 20 MG tablet  Commonly known as:  PEPCID  Take 20 mg by mouth at bedtime.     fluticasone 50 MCG/ACT nasal spray  Commonly known as:  FLONASE  Place 2 sprays into both nostrils every morning.     latanoprost 0.005 % ophthalmic solution  Commonly known as:  XALATAN  Place 1 drop into both eyes at bedtime.     levofloxacin 500 MG tablet  Commonly known as:  LEVAQUIN  Take 1 tablet (500 mg total) by mouth daily.     levothyroxine 125 MCG tablet  Commonly known as:  SYNTHROID, LEVOTHROID  Take 125 mcg by mouth daily before breakfast.     nisoldipine 8.5 MG 24 hr tablet  Commonly known as:  SULAR  Take 8.5 mg by mouth every morning.     polyethylene glycol packet  Commonly known as:  MIRALAX / GLYCOLAX  Take 17 g by mouth 2 (two) times daily.     predniSONE 10 MG tablet  Commonly known as:  DELTASONE  Take 10 mg by mouth daily with breakfast.     PROBIOTIC DAILY Caps  Take 1 capsule by mouth every morning.     sennosides-docusate sodium 8.6-50 MG tablet  Commonly known as:  SENOKOT-S  Take 2 tablets by mouth 2 (two) times daily.     sertraline 25 MG tablet  Commonly known as:  ZOLOFT  Take 25 mg by mouth every morning.     traMADol 50 MG tablet  Commonly known as:  ULTRAM  Take 50 mg by mouth every 12 (twelve) hours as needed (pain).         Total Time in preparing paper work, data evaluation and todays exam - 35 minutes  Taijah Macrae M.D on 05/30/2014 at 12:50 PM  [facility] Office  [phone]

## 2014-06-02 LAB — CULTURE, BLOOD (ROUTINE X 2)
CULTURE: NO GROWTH
CULTURE: NO GROWTH

## 2014-06-03 ENCOUNTER — Non-Acute Institutional Stay (SKILLED_NURSING_FACILITY): Payer: Medicare Other | Admitting: Nurse Practitioner

## 2014-06-03 ENCOUNTER — Encounter: Payer: Self-pay | Admitting: Nurse Practitioner

## 2014-06-03 DIAGNOSIS — J9601 Acute respiratory failure with hypoxia: Secondary | ICD-10-CM

## 2014-06-03 DIAGNOSIS — F3289 Other specified depressive episodes: Secondary | ICD-10-CM

## 2014-06-03 DIAGNOSIS — K5900 Constipation, unspecified: Secondary | ICD-10-CM

## 2014-06-03 DIAGNOSIS — F329 Major depressive disorder, single episode, unspecified: Secondary | ICD-10-CM

## 2014-06-03 DIAGNOSIS — J471 Bronchiectasis with (acute) exacerbation: Secondary | ICD-10-CM

## 2014-06-03 DIAGNOSIS — E871 Hypo-osmolality and hyponatremia: Secondary | ICD-10-CM

## 2014-06-03 DIAGNOSIS — G459 Transient cerebral ischemic attack, unspecified: Secondary | ICD-10-CM

## 2014-06-03 DIAGNOSIS — J9600 Acute respiratory failure, unspecified whether with hypoxia or hypercapnia: Secondary | ICD-10-CM

## 2014-06-03 DIAGNOSIS — I10 Essential (primary) hypertension: Secondary | ICD-10-CM

## 2014-06-03 DIAGNOSIS — D72829 Elevated white blood cell count, unspecified: Secondary | ICD-10-CM

## 2014-06-03 DIAGNOSIS — I519 Heart disease, unspecified: Secondary | ICD-10-CM

## 2014-06-03 DIAGNOSIS — E039 Hypothyroidism, unspecified: Secondary | ICD-10-CM

## 2014-06-03 DIAGNOSIS — G9340 Encephalopathy, unspecified: Secondary | ICD-10-CM

## 2014-06-03 NOTE — Assessment & Plan Note (Signed)
Clinically compensated.

## 2014-06-03 NOTE — Assessment & Plan Note (Signed)
Continue Plavix.  ?

## 2014-06-03 NOTE — Assessment & Plan Note (Signed)
Update CBC. 

## 2014-06-03 NOTE — Assessment & Plan Note (Signed)
On 2-3 L/min at base line, hypoxemic on ABG, improving.  No known history of COPD, most likely related to her bronchiectasis exacerbation.

## 2014-06-03 NOTE — Assessment & Plan Note (Signed)
Hx HTN  Hx TIA  Cont plvix, bisoprolol, sular

## 2014-06-03 NOTE — Assessment & Plan Note (Signed)
due to dehydration , resolved with IV fluids. Update CMP

## 2014-06-03 NOTE — Assessment & Plan Note (Signed)
Treated with IV Vancomycin and Cefepime, will complete po evofloxacin total of 5 days at SNF on her baseline of 10 mg by mouth prednisone daily.  Continue with nebulizers.  Cough and sputum production are improving.

## 2014-06-03 NOTE — Assessment & Plan Note (Signed)
No BM since Sunday(Tuesday today)-increase Senokot S II bid  and Miralax bid

## 2014-06-03 NOTE — Assessment & Plan Note (Signed)
05/22/14 16.686 05/23/14 TSH 11.055 Increase Levothyroxine 151mcg-update TSH 6 weeks.

## 2014-06-03 NOTE — Assessment & Plan Note (Signed)
Much improved, most likely related to her hypoxia and Hypercapnia.back to baseline.

## 2014-06-03 NOTE — Assessment & Plan Note (Signed)
Hold narcotics to avoid oversedation  Continue zoloft and PRN xanax

## 2014-06-03 NOTE — Progress Notes (Signed)
Patient ID: [name], female   DOB: Aug 01, 1933, 80 y.o.   MRN: [id number]   Code Status: DNR MOST comfort measures. ABT and IVF if indicated.   Allergies  Allergen Reactions  . Chocolate Cough  . Codeine     Unknown reaction  . Erythromycin Ethylsuccinate     REACTION: unspecified  . Iodine   . Iohexol      Desc: pt states allergy many years ago--unable to remember type of allergy--pre med in future slg 06/13/08   . Sulfamethoxazole     REACTION: unspecified  . Septra [Bactrim] Itching and Rash    Chief Complaint  Patient presents with  . Hospitalization Follow-up  . Medical Management of Chronic Issues  . Readmit To SNF    HPI: Patient is a 80 y.o. female seen in the SNF at [facility] today for evaluation of hypoxemia, AMS, elevated TSH, recent hospitalization for HCAP, Rt pubic ramus fx after fall (non-operative),  and other chronic medical conditions.    05/27/2014-05/30/2014 Bronchiectasis with acute exacerbation/HCAP (healthcare-associated pneumonia) and encephalopathy. Patient was sent from [facility] following prior hospitalization to Dr. Melvyn Novas office because of SOB and altered mental status.  She was subsequently sent to ED. In the ED chest x-ray showed no acute changes with coarse interstitial lung markings in the mid and lower lung zones bilaterally. WBC 22.7, sodium of 129, proBNP 528    Hospitalized 05/11/2014-05/16/2014 for HCAP , R pubic ramus fx-PRN tramadol , resolved acute encephalopathy 2nd to medications and respiratory failure-Respiratory failurerespiratory improved with narcan, bipap, nebs, IV abx and solumedrol and required no further bipap. She received no further narcotics and pain has been well   Problem List Items Addressed This Visit   HYPOTHYROIDISM     05/22/14 16.686 05/23/14 TSH 11.055 Increase Levothyroxine 123mcg-update TSH 6 weeks.       TIA     Continue Plavix.     BRONCHIECTASIS W/ACUTE EXACERBATION     Treated with IV Vancomycin and  Cefepime, will complete po evofloxacin total of 5 days at SNF on her baseline of 10 mg by mouth prednisone daily.  Continue with nebulizers.  Cough and sputum production are improving.      Hyponatremia - Primary     due to dehydration , resolved with IV fluids. Update CMP    Leukocytosis     Update CBC    Constipation     No BM since Sunday(Tuesday today)-increase Senokot S II bid  and Miralax bid    Diastolic dysfunction, left ventricle     Clinically compensated.     Depressive disorder, not elsewhere classified     Hold narcotics to avoid oversedation  Continue zoloft and PRN xanax     Acute encephalopathy     Much improved, most likely related to her hypoxia and Hypercapnia.back to baseline.     HTN (hypertension)     Hx HTN  Hx TIA  Cont plvix, bisoprolol, sular        Acute respiratory failure with hypoxia     On 2-3 L/min at base line, hypoxemic on ABG, improving.  No known history of COPD, most likely related to her bronchiectasis exacerbation.          Review of Systems:  Review of Systems  Constitutional: Positive for malaise/fatigue. Negative for fever, chills, weight loss and diaphoresis.       Generalized.   HENT: Positive for hearing loss. Negative for congestion, ear discharge, ear pain, nosebleeds, sore throat  and tinnitus.   Eyes: Negative for blurred vision, double vision, photophobia, pain, discharge and redness.  Respiratory: Positive for cough and shortness of breath. Negative for hemoptysis, sputum production, wheezing and stridor.   Cardiovascular: Positive for PND. Negative for chest pain, palpitations, orthopnea, claudication and leg swelling.  Gastrointestinal: Positive for constipation. Negative for heartburn, nausea, vomiting, abdominal pain, diarrhea, blood in stool and melena.  Genitourinary: Negative for dysuria, urgency, frequency, hematuria and flank pain.  Musculoskeletal: Positive for falls and joint pain. Negative for back pain,  myalgias and neck pain.       Right groin  Skin: Negative for itching and rash.  Neurological: Positive for weakness. Negative for dizziness, tingling, tremors, sensory change, speech change, focal weakness, seizures, loss of consciousness and headaches.  Endo/Heme/Allergies: Negative for environmental allergies and polydipsia. Does not bruise/bleed easily.  Psychiatric/Behavioral: Positive for memory loss. Negative for depression, suicidal ideas, hallucinations and substance abuse. The patient is not nervous/anxious and does not have insomnia.      Past Medical History  Diagnosis Date  . Bronchiectasis with acute exacerbation 05/16/14    acute respiratory failure   . Other and unspecified hyperlipidemia   . TIA (transient ischemic attack)   . Osteoporosis, unspecified   . Asthma   . Esophageal reflux   . IBS (irritable bowel syndrome)   . Diverticulosis   . Hemorrhoids   . Hypothyroidism   . Schatzki's ring   . Hiatal hernia   . Tubular adenoma of colon 12/2008  . Chronic respiratory failure 05/16/14  . HCAP (healthcare-associated pneumonia) 05/16/14  . Sinusitis   . GERD (gastroesophageal reflux disease)   . HTN (hypertension)   . Encephalopathy 05/16/14    noted during hospitalization  . Depressive disorder, not elsewhere classified   . Diastolic dysfunction, left ventricle 05/16/14  . Memory loss   . Pubic ramus fracture   . Hiatal hernia   . Tubular adenoma of colon   . Hypoxia   . Anemia, unspecified   . Constipation    Past Surgical History  Procedure Laterality Date  . Cholecystectomy  1995  . Nasal sinus surgery    . Tonsillectomy    . Foot surgery    . Small intestine surgery  05/2008   Social History:   reports that she has never smoked. She has never used smokeless tobacco. She reports that she does not drink alcohol or use illicit drugs.  Family History  Problem Relation Age of Onset  . Hypertension Sister   . Heart disease Mother   . Heart disease  Father   . Stroke Father   . Multiple myeloma Maternal Aunt   . Colon cancer Mother     Medications: Patient's Medications  New Prescriptions   No medications on file  Previous Medications   ALBUTEROL (PROVENTIL) (2.5 MG/3ML) 0.083% NEBULIZER SOLUTION    Take 2.5 mg by nebulization every 3 (three) hours as needed for wheezing or shortness of breath.   ALPRAZOLAM (XANAX) 0.25 MG TABLET    Take 1 tablet (0.25 mg total) by mouth 2 (two) times daily as needed for anxiety.   ARFORMOTEROL (BROVANA) 15 MCG/2ML NEBU    Take 15 mcg by nebulization every 12 (twelve) hours.   BISOPROLOL (ZEBETA) 5 MG TABLET    Take 2.5 mg by mouth every morning.    BUDESONIDE (PULMICORT) 0.5 MG/2ML NEBULIZER SOLUTION    Take 0.5 mg by nebulization 2 (two) times daily.   CALCIUM-VITAMIN D (OSCAL WITH D) 500-200 MG-UNIT  PER TABLET    Take 1 tablet by mouth 2 (two) times daily.     CETIRIZINE (ZYRTEC) 10 MG TABLET    Take 10 mg by mouth at bedtime.   CLOPIDOGREL (PLAVIX) 75 MG TABLET    Take 75 mg by mouth every morning.    FAMOTIDINE (PEPCID) 20 MG TABLET    Take 20 mg by mouth at bedtime.    FLUTICASONE (FLONASE) 50 MCG/ACT NASAL SPRAY    Place 2 sprays into both nostrils every morning.    LANSOPRAZOLE (PREVACID) 15 MG CAPSULE    Take 1 capsule (15 mg total) by mouth daily at 12 noon.   LATANOPROST (XALATAN) 0.005 % OPHTHALMIC SOLUTION    Place 1 drop into both eyes at bedtime.    LEVOFLOXACIN (LEVAQUIN) 500 MG TABLET    Take 1 tablet (500 mg total) by mouth daily.   LEVOTHYROXINE (SYNTHROID, LEVOTHROID) 125 MCG TABLET    Take 125 mcg by mouth daily before breakfast.   NISOLDIPINE (SULAR) 8.5 MG 24 HR TABLET    Take 8.5 mg by mouth every morning.    POLYETHYLENE GLYCOL (MIRALAX / GLYCOLAX) PACKET    Take 17 g by mouth 2 (two) times daily.   PREDNISONE (DELTASONE) 10 MG TABLET    Take 10 mg by mouth daily with breakfast.   PROBIOTIC PRODUCT (PROBIOTIC DAILY) CAPS    Take 1 capsule by mouth every morning.    SENNOSIDES-DOCUSATE SODIUM (SENOKOT-S) 8.6-50 MG TABLET    Take 2 tablets by mouth 2 (two) times daily.   SERTRALINE (ZOLOFT) 25 MG TABLET    Take 25 mg by mouth every morning.    TRAMADOL (ULTRAM) 50 MG TABLET    Take 50 mg by mouth every 12 (twelve) hours as needed (pain).  Modified Medications   No medications on file  Discontinued Medications   No medications on file     Physical Exam: Physical Exam  Constitutional: She appears well-developed and well-nourished. No distress.  HENT:  Head: Normocephalic and atraumatic.  Right Ear: External ear normal.  Left Ear: External ear normal.  Nose: Nose normal.  Mouth/Throat: Oropharynx is clear and moist. No oropharyngeal exudate.  Eyes: Conjunctivae and EOM are normal. Pupils are equal, round, and reactive to light. Right eye exhibits no discharge. Left eye exhibits no discharge. No scleral icterus.  Neck: Normal range of motion. Neck supple. No JVD present. No tracheal deviation present. No thyromegaly present.  Cardiovascular: Normal rate, regular rhythm, normal heart sounds and intact distal pulses.   No murmur heard. Pulmonary/Chest: No stridor. No respiratory distress. She has no wheezes. She has rales. She exhibits no tenderness.  O2 dependent. accessory muscle use.   Abdominal: She exhibits no distension and no mass. There is no tenderness. There is no rebound and no guarding.  Musculoskeletal: Normal range of motion. She exhibits tenderness. She exhibits no edema.  In right groin and buttock.   Lymphadenopathy:    She has no cervical adenopathy.  Neurological: She is alert. She has normal reflexes. No cranial nerve deficit. She exhibits normal muscle tone. Coordination normal.  Skin: Skin is warm and dry. No rash noted. She is not diaphoretic. No erythema. No pallor.  Psychiatric: She has a normal mood and affect. Her behavior is normal. Thought content normal.    Filed Vitals:   06/03/14 1512  BP: 160/80  Pulse: 82  Temp:  98.4 F (36.9 C)  TempSrc: Tympanic  Resp: 20      Labs reviewed:  Basic Metabolic Panel:  Recent Labs  05/22/14 05/23/14  05/27/14 1340 05/27/14 1414 05/28/14 0557 05/29/14 0520  NA  --   --   < > 129*  --  131* 133*  K  --   --   < > 4.2  --  4.5 4.6  CL  --   --   --  89*  --  94* 99  CO2  --   --   --  25  --  25 23  GLUCOSE  --   --   --  125*  --  112* 108*  BUN  --   --   < > 16  --  14 14  CREATININE  --   --   < > 0.75  --  0.71 0.61  CALCIUM  --   --   --  9.1  --  7.6* 7.5*  MG  --   --   --   --  2.3  --   --   PHOS  --   --   --   --  3.4  --   --   TSH 16.69* 11.06*  --   --   --   --   --   < > = values in this interval not displayed. Liver Function Tests:  Recent Labs  05/11/14 1122 05/13/14 0249 05/26/14 05/27/14 1340  AST 40* $Remov'30 20 29  'iNZYJD$ ALT 35 $Remo'26 13 17  'AgDKw$ ALKPHOS 74 52 203* 304*  BILITOT 0.2* 0.2*  --  0.5  PROT 7.8 6.3  --  7.4  ALBUMIN 3.5 2.6*  --  3.3*   No results found for this basename: LIPASE, AMYLASE,  in the last 8760 hours No results found for this basename: AMMONIA,  in the last 8760 hours CBC:  Recent Labs  05/11/14 1122  05/13/14 0249 05/14/14 0352  05/27/14 1340 05/28/14 0557 05/29/14 0520  WBC 29.5*  < > 17.9* 19.1*  < > 22.7* 7.8 8.7  NEUTROABS 23.9*  --  17.3* 16.2*  --   --   --   --   HGB 12.7  < > 10.3* 10.3*  < > 12.5 10.3* 10.2*  HCT 42.3  < > 33.8* 34.1*  < > 39.3 33.5* 32.8*  MCV 83.6  < > 80.3 80.4  < > 79.2 77.9* 79.2  PLT 372  < > 226 261  < > 596* 514* 518*  < > = values in this interval not displayed. Lipid Panel:  Recent Labs  06/12/13 0924  CHOL 181  HDL 75.00  LDLCALC 86  TRIG 98.0  CHOLHDL 2    Past Procedures:  8/16 Dopper legs >> no DVT  8/17 Echo >> mod/severe LVH, EF 55 to 09%, grade 1 diastolic dysfx  3/26 CT chest >> borderline LAN with calcifications, patchy GGO with BTX and peribronchovascular nodularity, consolidation LLL  05/27/14 CXR IMPRESSION:  There has not been significant  interval change since a study of  earlier today.  Assessment/Plan Hyponatremia due to dehydration , resolved with IV fluids. Update CMP  HYPOTHYROIDISM 05/22/14 16.686 05/23/14 TSH 11.055 Increase Levothyroxine 140mcg-update TSH 6 weeks.     TIA Continue Plavix.   HTN (hypertension) Hx HTN  Hx TIA  Cont plvix, bisoprolol, sular      Diastolic dysfunction, left ventricle Clinically compensated.   Acute encephalopathy Much improved, most likely related to her hypoxia and Hypercapnia.back to baseline.   BRONCHIECTASIS W/ACUTE EXACERBATION Treated with IV Vancomycin and  Cefepime, will complete po evofloxacin total of 5 days at SNF on her baseline of 10 mg by mouth prednisone daily.  Continue with nebulizers.  Cough and sputum production are improving.    Acute respiratory failure with hypoxia On 2-3 L/min at base line, hypoxemic on ABG, improving.  No known history of COPD, most likely related to her bronchiectasis exacerbation.     Leukocytosis Update CBC  Depressive disorder, not elsewhere classified Hold narcotics to avoid oversedation  Continue zoloft and PRN xanax   Constipation No BM since Sunday(Tuesday today)-increase Senokot S II bid  and Miralax bid    Family/ Staff Communication: observe the patient. F/u Pulmonology.   Goals of Care: SNF  Labs/tests ordered: TSH 6 weeks. CBC and CMP

## 2014-06-05 LAB — BASIC METABOLIC PANEL
BUN: 12 mg/dL (ref 4–21)
Creatinine: 0.6 mg/dL (ref 0.5–1.1)
Glucose: 117 mg/dL
Potassium: 3.6 mmol/L (ref 3.4–5.3)
Sodium: 130 mmol/L — AB (ref 137–147)

## 2014-06-05 LAB — HEPATIC FUNCTION PANEL
ALK PHOS: 257 U/L — AB (ref 25–125)
ALT: 14 U/L (ref 7–35)
AST: 21 U/L (ref 13–35)
Bilirubin, Total: 0.4 mg/dL

## 2014-06-05 LAB — CBC AND DIFFERENTIAL
HEMATOCRIT: 36 % (ref 36–46)
HEMOGLOBIN: 11.3 g/dL — AB (ref 12.0–16.0)
Platelets: 447 10*3/uL — AB (ref 150–399)
WBC: 10.9 10*3/mL

## 2014-06-06 ENCOUNTER — Encounter: Payer: Self-pay | Admitting: Internal Medicine

## 2014-06-06 ENCOUNTER — Other Ambulatory Visit: Payer: Self-pay | Admitting: Nurse Practitioner

## 2014-06-06 ENCOUNTER — Encounter: Payer: Self-pay | Admitting: Nurse Practitioner

## 2014-06-06 ENCOUNTER — Ambulatory Visit (INDEPENDENT_AMBULATORY_CARE_PROVIDER_SITE_OTHER): Payer: Medicare Other | Admitting: Internal Medicine

## 2014-06-06 VITALS — BP 122/84 | HR 82 | Temp 98.2°F

## 2014-06-06 DIAGNOSIS — J9610 Chronic respiratory failure, unspecified whether with hypoxia or hypercapnia: Secondary | ICD-10-CM

## 2014-06-06 DIAGNOSIS — R0902 Hypoxemia: Secondary | ICD-10-CM

## 2014-06-06 DIAGNOSIS — J9611 Chronic respiratory failure with hypoxia: Secondary | ICD-10-CM

## 2014-06-06 DIAGNOSIS — J479 Bronchiectasis, uncomplicated: Secondary | ICD-10-CM

## 2014-06-06 DIAGNOSIS — J471 Bronchiectasis with (acute) exacerbation: Secondary | ICD-10-CM

## 2014-06-06 DIAGNOSIS — D649 Anemia, unspecified: Secondary | ICD-10-CM

## 2014-06-06 DIAGNOSIS — K5900 Constipation, unspecified: Secondary | ICD-10-CM

## 2014-06-06 DIAGNOSIS — D72829 Elevated white blood cell count, unspecified: Secondary | ICD-10-CM

## 2014-06-06 DIAGNOSIS — E871 Hypo-osmolality and hyponatremia: Secondary | ICD-10-CM

## 2014-06-06 NOTE — Progress Notes (Signed)
Subjective:     Patient ID: [name], female   DOB: [birth year] .   MRN: [id number]   Brief patient profile:  38 yowf never smoker with documented chronic sinusitis /bronchiectasis and a tendency to recurrent purulent exacerbations, has been self titrating prednisone with a ceiling of 40 mg per day and a floor of 5 extensively evaluated at [facility] and by Dr Allena Katz with neg w/u x atopic.    History of Present Illness  12/19/2011 Follow up  Patient returns today for a followup visit. Patient has history of osteoporosis and has been on yearly Reclast infusions. She is due for her Reclast this month. Takes oscal d Twice daily   Over the last week. Complains of increased cough, congestion, with thick, green mucus. Has not filled her standing rx for omnicef. Currently on prednisone 10mg  daily  No hemoptysis , chest pain or edema.  >>Reclast rx and Omnicef rx x 10 d       04/26/2013 Acute OV  Complains of increased urinary frequency, with cloudiness x2 weeks.  Dyspnea is unchanged; denies dysurina, pressure, odor. No back pain, n/v, hematuria or fever.  No recent UTI . Over last 2 weeks more symptoms of urinary urgency and frequenty. Last few days, urine has strong odor and is dark and cloudy. No dysuria or discharge. Also BMD returned with worsening T score -3.2. Discussed continuing yearly Reclast. Advised to cont on calcium and vitamin d.  rec Cipro 250mg  Twice daily  For 7 days  Push fluids  Healthy urinary practices.  Return in couple of weeks for labs for Reclast> did not return   06/12/2013 f/u ov/[name] re: bronchiectasis/ hypothyroid/hbp/ yearly comprehensive  Chief Complaint  Patient presents with  . Annual Exam    Pt fasting. Pt c/o increased SOB over the past several months. Her cough is also worse and is prod with green sputum for the past 2 months. Taking pred 10 mg daily.   doxy rx > no better.  Can't tol vest.   >>labs w/ good cholesterol at goal , TSH elevated w/  increased dose of synthroid rx   07/09/2013 Follow up  4 week follow up Bronchiectasis and discuss Reclast  Had recent flare of bronchiectasis that resolved with abx and return to her baseline.  Reports breathing is primarily unchanged since last ov  . Cough not quite as bad. No fever or hemoptysis.  Is due for Reclast. BMD w/ T score -3.2.  Lost her long term boyfriend last week, he passed away with dementia. They never married. Says she is dealing ok but is very sad, support given  She remains in [place] at [facility].  rec No change rx  08/07/2013 f/u ov/[name] re: bronchiectasis / hypothyroid/hypertension/ osteo Chief Complaint  Patient presents with  . Acute Visit    Increased SOB,  cough w/ green mucus, wheezing and tightness x2 months    Just fiinshed a burst of pred, no recent abx,not really using her prns aggressively. Sob with more than room to room walking/ 02 dep  >CT chest showed 08/09/13 extensive cylindrical bronchiectasis w/ extensive mucoid impaction  rec No change rx/ ice pack to R shoulder prn  09/16/2013 Follow up  Pt returns ov for 6 week follow up for bronchiectasis.  Patient with recent bronchiectasis, flare, and treated with a course of prednisone, with her aggressive pulmonary hygiene with nebulizers. Patient reports she is slowly improving, however, to have a daily. Chronic cough. Patient had a CT  chest last visit showing extensive bronchiectasis with extensive mucoid impactions, which has been similar to her CTs in the past. Patient was also having some right shoulder blade pain. It has resolved.  rec  no change rx   10/28/2013 f/u ov/[name] re:  Chief Complaint  Patient presents with  . Follow-up    Pt states that her cough and dyspnea are progressively worse since the last visit. Cough is prod with minimal green sputum.   walks from her room to cafeteria on 02 4lpm pulsed, 2lpm at home   >>no changes   01/27/2014 Follow up  Returns for  3  month follow up Reports dyspnea with walking is getting worse. Wears out easily . Was suppose to use Oxygen 4ll/m continuous flow from last ov however on pulsing device on arrival with O2 sats 79-84%.  We discussed using her walker with basket to carry O2 tank.  Had increase cough and wheezing last week -did pred taper, now back to 10mg  daily .  No fever, increased discolored mucus, abd pain, n/v/d, or edema, or chest pain.  rec Wear Oxygen 3 l/m at rest and 4l/m continuous flow walking  Mucinex  Twice daily  As needed  Cough/congestion  Along w/ flutter valve    02/10/2014 f/u ov/[name] re: ab/bronchiectasis/ has med calendar but not using action plan Chief Complaint  Patient presents with  . Follow-up    Pt states that her breathing is progressively worse since the last visit. She is currently on 40 mg prednisone. She c/o prod cough with large amounts of yellow sputum x 1 wk.    not using augmentin, nasal steroids, afrin as per calendar and having more purulent sputum and nasal congestion. Comfortable at rest p saba but only using once or twice a day rec Follow the med calendar  03/11/2014 f/u ov/[name] re: prednisone at 10 baseline, when feeling fine no need for albuterol while on perforimist bid Chief Complaint  Patient presents with  . Acute Visit    Pt c/o increased DOE for the past 5 days. She states that she gets out of breath just walking across the room.  She also c/o increased cough with large amounts of yellow sputum and wheezing. She has been using albuterol neb at least twice per day and as of today increased pred to 40 mg.   worse x 5 days, just increased the prednisone one day prior to OV, has not yet started omnaris or augmentin from her action plans.  No sob at rest  >>pred burst .    04/07/2014 Acute OV  Complains of increased SOB, wheezing, prod cough with green mucus x3 days.  Denies hemoptysis, n/v/d, PND, leg swelling, orthopnea or fever.  Currently on prednisone 10mg   daily  Returns back to office on pulsing O2 , desats on demad device. We discussed importance of continuous flow o2 and she agrees to change devices .  >augmentin /pred burst   05/27/2014 [facility] follow up  Patient returns for a post hospital followup. Patient was admitted August 16 through August 21 for healthcare associated pneumonia, and bronchiectasis, exacerbation. Unfortunately, patient had a fall on 816 , and suffered a right pubic ramus fracture. Patient was sent back to her assisted living on pain medications and return shortly back to the emergency room in respiratory distress. She was found to have significant pulmonary edema, and a new HCAP. She was placed on BiPAP support. Treated with aggressive IV antibiotics. She also had acute encephalopathy, and was  given Narcan. She did have a bump in her troponin felt to be stress induced. Echo showed a grade 1 diastolic dysfunction. BNP was elevated and she received gentle diuresis. CT chest showed patchy, groundglass a pacer the, with consolidation in the left lower lobe. Was negative for pulmonary embolus am. Venous Doppler of her legs showed no DVT. She was transitioned over to oral antibiotics, Levaquin. At discharge. cc very weak. Does have some confusion , did not recognize me at all. Disoriented to surrounding and appears to have some visual hallucinations.  Notes from the nursing home were reviewed. Patient's TSH has been elevated and her Synthroid has been adjusted per NH medical staff.  Chest x-ray today shows chronic changes with no acute process noted.  rec Admit/ dx ? pna   Admission date: 05/27/2014  Discharge Date 05/30/2014   Admission Diagnosis Bronchiectasis with acute exacerbation [494.1]  HCAP (healthcare-associated pneumonia) [486]  Discharge Diagnosis Principal Problem:  HCAP (healthcare-associated pneumonia)  Active Problems:  HYPOTHYROIDISM  BRONCHIECTASIS W/ACUTE EXACERBATION  Memory loss  Acute encephalopathy   Acute respiratory failure with hypoxia  Protein-calorie malnutrition, severe     06/06/2014 f/u ov/[name] re: chronic resp failure Chief Complaint  Patient presents with  . HFU    Pt reports she is feeling better since hospital d/c. No new co's today.   w/c dep/ 02 dep but overall better, still congested am cough with green sputum  No obvious day to day or daytime variabilty or assoc chronic cough or cp or chest tightness, subjective wheeze overt sinus or hb symptoms. No unusual exp hx or h/o childhood pna/ asthma or knowledge of premature birth.  Sleeping ok without nocturnal  or early am exacerbation  of respiratory  c/o's or need for noct saba. Also denies any obvious fluctuation of symptoms with weather or environmental changes or other aggravating or alleviating factors except as outlined above   Current Medications, Allergies, Complete Past Medical History, Past Surgical History, Family History, and Social History were reviewed in [employer] record.  ROS  The following are not active complaints unless bolded sore throat, dysphagia, dental problems, itching, sneezing,  nasal congestion or excess/ purulent secretions, ear ache,   fever, chills, sweats, unintended wt loss, pleuritic or exertional cp, hemoptysis,  orthopnea pnd or leg swelling, presyncope, palpitations, heartburn, abdominal pain, anorexia, nausea, vomiting, diarrhea  or change in bowel or urinary habits, change in stools or urine, dysuria,hematuria,  rash, arthralgias, visual complaints, headache, numbness weakness or ataxia or problems with walking or coordination,  change in mood/affect or memory.                   Allergies  1) ! Iodine  2) Sulfamethoxazole (Sulfamethoxazole)  3) Erythromycin Ethylsuccinate    Past Medical History:  HYPOTHYROIDISM (ICD-244.9)  BRONCHIECTASIS W/ACUTE EXACERBATION (ICD-494.1)  - Xolair attempted 6/04 -> 9/05 no benefit - Immunotherapy stopped 04/2008  -  Started xyflo 8/09 > 05/2008 no benefit  - VEST 2008 as inpt > no benefit  - Flutter valve helpful March 17, 2009  - Allergy profile April 12, 2010 >> IgE 30 unremarkable  - Dulera 200 added November 22, 2010  Could not tol thrush HYPERLIPIDEMIA (ICD-272.4)  - Target LDL < 70 due to prev TIA's  TRANSIENT ISCHEMIC ATTACKS, HX OF (ICD-V12.50).............................................Marland KitchenReynolds  OSTEOPOROSIS (ICD-733.00)  - Reclast January 2009, 2010, October 26, 2009 , 11/2010 , 11/3011 , due >01/10/12  - Bone Density 08/05/09 Spine 0.1 Left -2.2, Right -1.6  Health Maintenance...................................................................................................Marland KitchenWert  -Td 11/2003 -Pneumovax 11/04 and 05/2008 -Med calendar 12/24/08  And 03/01/11, 10/06/2011  -CPX 06/12/2013  -GYN health maint...........................................................................Marland Kitchen  Mody Asthma  Diverticulosis  - Colonoscopy 01/01/09.....................................................................Marland KitchenFuller Plan  GERD  Irritable Bowel Syndrome  Hemorrhoids  R Shoulder Pain..................................................................................Marland KitchenDel Muerto  Hypothyroid     Social History She never married. No kids. Took care of her parents all her life  She has a gentlemen friend that she has been with since she was 14 but he lives in [place]  (He is now in his 53s but she never wanted to marry him).  Her mother died 07-10-2011 She was a Mudlogger work.  Moved to Friend's home 2013 Has a sister OOT      Objective:   Physical Exam  wt 105 January 07, 2009 > 114 November 19, 2009 >   03/12/2012 117 > 06/27/2012  111> 105 08/03/2012  >106 08/09/2012 > 105 09/10/2012 >  108 12/25/12>108 04/26/13 > 06/12/2013 104 >104 07/09/2013 > 08/07/2013 106 >107 09/16/2013  > 10/28/2013  105 >105 01/27/2014 > 104 02/10/2014 > .03/11/14 106    W/c bound    elderly wf nad  Chronically ill appearing    HEENT: nl dentition, turbinates, and orophanx  Nasal clear drainage. ,max sinus tenderness  NECK : without JVD/Nodes/TM/ nl carotid upstrokes bilaterally  LUNGS: no acc muscle use, few rhonchi  CV: RRR no s3 or murmur or increase in P2, tr edema R>:L Mild right calf tenderness   ABD: soft and nontender with nl excursion in the supine position. No bruits or organomegaly, bowel sounds nl   MS: nl gait/  warm without deformities, calf tenderness, cyanosis or clubbing Neuro  confused     CXR 05/27/2014 >Stable appearance of the chest from 05/15/2014. Residual lung   opacities are near the patient's baseline; based on CT the chronic lung disease is likely from indolent infection (as with mycobacterium avium) with bronchiectasis.       Assessment:

## 2014-06-06 NOTE — Patient Instructions (Addendum)
Your tsh will need to be checked again on 06/26/14   Continued pulmonary follow up is not  needed unless Dr Nyoka Cowden has an issue with your care but I would emphasize a shift to palliative measures rather than place you back in the hospital repeatedly

## 2014-06-06 NOTE — Progress Notes (Signed)
This encounter was created in error - please disregard.

## 2014-06-10 ENCOUNTER — Non-Acute Institutional Stay (SKILLED_NURSING_FACILITY): Payer: Medicare Other | Admitting: Nurse Practitioner

## 2014-06-10 ENCOUNTER — Encounter: Payer: Self-pay | Admitting: Nurse Practitioner

## 2014-06-10 DIAGNOSIS — E871 Hypo-osmolality and hyponatremia: Secondary | ICD-10-CM

## 2014-06-10 DIAGNOSIS — I519 Heart disease, unspecified: Secondary | ICD-10-CM

## 2014-06-10 DIAGNOSIS — D72829 Elevated white blood cell count, unspecified: Secondary | ICD-10-CM

## 2014-06-10 DIAGNOSIS — J471 Bronchiectasis with (acute) exacerbation: Secondary | ICD-10-CM

## 2014-06-10 DIAGNOSIS — F32A Depression, unspecified: Secondary | ICD-10-CM

## 2014-06-10 DIAGNOSIS — K219 Gastro-esophageal reflux disease without esophagitis: Secondary | ICD-10-CM

## 2014-06-10 DIAGNOSIS — J9610 Chronic respiratory failure, unspecified whether with hypoxia or hypercapnia: Secondary | ICD-10-CM

## 2014-06-10 DIAGNOSIS — E039 Hypothyroidism, unspecified: Secondary | ICD-10-CM

## 2014-06-10 DIAGNOSIS — F329 Major depressive disorder, single episode, unspecified: Secondary | ICD-10-CM

## 2014-06-10 DIAGNOSIS — I5189 Other ill-defined heart diseases: Secondary | ICD-10-CM

## 2014-06-10 DIAGNOSIS — J9601 Acute respiratory failure with hypoxia: Secondary | ICD-10-CM

## 2014-06-10 DIAGNOSIS — K5900 Constipation, unspecified: Secondary | ICD-10-CM

## 2014-06-10 DIAGNOSIS — J9600 Acute respiratory failure, unspecified whether with hypoxia or hypercapnia: Secondary | ICD-10-CM

## 2014-06-10 DIAGNOSIS — R413 Other amnesia: Secondary | ICD-10-CM

## 2014-06-10 DIAGNOSIS — R0902 Hypoxemia: Secondary | ICD-10-CM

## 2014-06-10 DIAGNOSIS — D649 Anemia, unspecified: Secondary | ICD-10-CM

## 2014-06-10 DIAGNOSIS — I10 Essential (primary) hypertension: Secondary | ICD-10-CM

## 2014-06-10 DIAGNOSIS — F3289 Other specified depressive episodes: Secondary | ICD-10-CM

## 2014-06-10 DIAGNOSIS — G9340 Encephalopathy, unspecified: Secondary | ICD-10-CM

## 2014-06-10 NOTE — Assessment & Plan Note (Signed)
8/17 Echo >> mod/severe LVH, EF 55 to 81%, grade 1 diastolic dysfx. Gentle diuresed while in hospital.

## 2014-06-10 NOTE — Assessment & Plan Note (Signed)
Hold narcotics to avoid oversedation  Continue zoloft and PRN xanax

## 2014-06-10 NOTE — Assessment & Plan Note (Signed)
WBC ? Elevated on chronic steroids (12-16k up/down )  06/05/14 wbc 10.9

## 2014-06-10 NOTE — Assessment & Plan Note (Signed)
Multiple factorial: narcotic prior to hospitalization, chronic hypoxemia, acute pelvic fx, and PNA. Recommended Hospice Service.

## 2014-06-10 NOTE — Assessment & Plan Note (Signed)
05/22/14 16.686 05/23/14 TSH 11.055 Increase Levothyroxine 135mcg-update TSH 6 weeks.

## 2014-06-10 NOTE — Assessment & Plan Note (Signed)
Clinically compensated cardiac aspect.

## 2014-06-10 NOTE — Assessment & Plan Note (Signed)
Much improved, most likely related to her hypoxia and Hypercapnia.back to baseline.

## 2014-06-10 NOTE — Assessment & Plan Note (Signed)
PT  Ultram for pain  The right

## 2014-06-10 NOTE — Assessment & Plan Note (Signed)
Hx HTN  Hx TIA  Cont plvix, bisoprolol, sular

## 2014-06-10 NOTE — Assessment & Plan Note (Signed)
Stable off Senokot S II bid  and continued Miralax bid

## 2014-06-10 NOTE — Assessment & Plan Note (Signed)
06/06/14 Dr. Melvyn Novas Pulmonology: Palliative measures, continued pulmonary follow up is not needed.  O2 4lpm via [place]. No known history of COPD, most likely related to her bronchiectasis exacerbation.

## 2014-06-10 NOTE — Assessment & Plan Note (Signed)
05/22/14 16.686 05/23/14 TSH 11.055 Increase Levothyroxine 12mcg-update TSH 6 weeks.

## 2014-06-10 NOTE — Assessment & Plan Note (Signed)
Stable, takes Miralax bid. 06/04/14 dc Senna S

## 2014-06-10 NOTE — Assessment & Plan Note (Signed)
at O2 90s % with O2 4lmp via [place]. No longer f/u Pulmonology recommended. Hospice Referral.  Maintained on Brovana bid, Budesonide bid, and Prednisone 10mg 

## 2014-06-10 NOTE — Assessment & Plan Note (Signed)
Stable.  Continue Pepcid 20mg daily

## 2014-06-10 NOTE — Assessment & Plan Note (Signed)
Hgb 11.4. 05/15/14 05/27/14 Hgb 12.5 06/05/14 Hgb 11.3

## 2014-06-10 NOTE — Progress Notes (Signed)
Patient ID: [name], female   DOB: [birth year], 80 y.o.   MRN: [id number]   Code Status: DNR MOST comfort measures. ABT and IVF if indicated.   Allergies  Allergen Reactions  . Chocolate Cough  . Codeine     Unknown reaction  . Erythromycin Ethylsuccinate     REACTION: unspecified  . Iodine   . Iohexol      Desc: pt states allergy many years ago--unable to remember type of allergy--pre med in future slg 06/13/08   . Sulfamethoxazole     REACTION: unspecified  . Septra [Bactrim] Itching and Rash    Chief Complaint  Patient presents with  . Medical Management of Chronic Issues  . Acute Visit    left ankle skin tear, chronic respiratory failure.     HPI: Patient is a 80 y.o. female seen in the SNF at [facility] today for evaluation of hypoxemia-chronic respiratory failure  and other chronic medical conditions.    05/27/2014-05/30/2014 Bronchiectasis with acute exacerbation/HCAP (healthcare-associated pneumonia) and encephalopathy. Patient was sent from [facility] following prior hospitalization to Dr. Melvyn Novas office because of SOB and altered mental status.  She was subsequently sent to ED. In the ED chest x-ray showed no acute changes with coarse interstitial lung markings in the mid and lower lung zones bilaterally. WBC 22.7, sodium of 129, proBNP 528    Hospitalized 05/11/2014-05/16/2014 for HCAP , R pubic ramus fx-PRN tramadol , resolved acute encephalopathy 2nd to medications and respiratory failure-Respiratory failurerespiratory improved with narcan, bipap, nebs, IV abx and solumedrol and required no further bipap. She received no further narcotics and pain has been well   Problem List Items Addressed This Visit   Acute encephalopathy     Much improved, most likely related to her hypoxia and Hypercapnia.back to baseline.      Acute respiratory failure with hypoxia     On 4L/min at base line, hypoxemic, sat O2 90S% No known history of COPD, most likely related to her  bronchiectasis exacerbation.        Anemia, unspecified     Hgb 11.4. 05/15/14 05/27/14 Hgb 12.5 06/05/14 Hgb 11.3       BRONCHIECTASIS W/ACUTE EXACERBATION     treated with IV Vancomycin and Cefepime in hospital, completed po evofloxacin total of 5 days at [facility] on her baseline of 10 mg by mouth prednisone daily.  Continue with nebulizers.  Cough and sputum production are improving.       Chronic respiratory failure, unspecified whether with hypoxia or hypercapnia - Primary     06/06/14 Dr. Melvyn Novas Pulmonology: Palliative measures, continued pulmonary follow up is not needed.  O2 4lpm via [place]. No known history of COPD, most likely related to her bronchiectasis exacerbation.        Constipation     Stable, takes Miralax bid. 06/04/14 dc Senna S      Depression     Hold narcotics to avoid oversedation  Continue zoloft and PRN xanax      Depressive disorder, not elsewhere classified     Hold narcotics to avoid oversedation  Continue zoloft and PRN xanax      Diastolic dysfunction, left ventricle     Clinically compensated cardiac aspect.      GERD     Stable. Continue Pepcid $RemoveBeforeD'20mg'[id number]$  daily.      HTN (hypertension)     Hx HTN  Hx TIA  Cont plvix, bisoprolol, sular        Hyponatremia  Followed as Primary Care Patient/ [facility]/ [name]  05/26/14 Na 126 06/05/14 Na 130 due to dehydration , resolved with IV fluids upon hospital discharge.     HYPOTHYROIDISM     05/22/14 16.686 05/23/14 TSH 11.055 Increase Levothyroxine 150mcg-update TSH 6 weeks.       Hypothyroidism     05/22/14 16.686 05/23/14 TSH 11.055 Increase Levothyroxine 125mcg-update TSH 6 weeks.        Hypoxia     at O2 90s % with O2 4lmp via [place]. No longer f/u Pulmonology recommended. Hospice Referral.  Maintained on Brovana bid, Budesonide bid, and Prednisone $RemoveBefore'10mg'[id number]$        Left ventricular diastolic dysfunction, NYHA class 1     8/17 Echo >> mod/severe LVH, EF 55 to 40%, grade 1 diastolic  dysfx. Gentle diuresed while in hospital.       Leukocytosis     WBC ? Elevated on chronic steroids (12-16k up/down )  06/05/14 wbc 10.9     Memory deficit      Multiple factorial: narcotic prior to hospitalization, chronic hypoxemia, acute pelvic fx, and PNA. Recommended Hospice Service.       Unspecified constipation     Stable off Senokot S II bid  and continued Miralax bid        Review of Systems:  Review of Systems  Constitutional: Positive for malaise/fatigue. Negative for fever, chills, weight loss and diaphoresis.       Generalized.   HENT: Positive for hearing loss. Negative for congestion, ear discharge, ear pain, nosebleeds, sore throat and tinnitus.   Eyes: Negative for blurred vision, double vision, photophobia, pain, discharge and redness.  Respiratory: Positive for cough and shortness of breath. Negative for hemoptysis, sputum production, wheezing and stridor.   Cardiovascular: Positive for PND. Negative for chest pain, palpitations, orthopnea, claudication and leg swelling.  Gastrointestinal: Positive for constipation. Negative for heartburn, nausea, vomiting, abdominal pain, diarrhea, blood in stool and melena.  Genitourinary: Negative for dysuria, urgency, frequency, hematuria and flank pain.  Musculoskeletal: Positive for falls and joint pain. Negative for back pain, myalgias and neck pain.       Right groin  Skin: Negative for itching and rash.  Neurological: Positive for weakness. Negative for dizziness, tingling, tremors, sensory change, speech change, focal weakness, seizures, loss of consciousness and headaches.  Endo/Heme/Allergies: Negative for environmental allergies and polydipsia. Does not bruise/bleed easily.  Psychiatric/Behavioral: Positive for memory loss. Negative for depression, suicidal ideas, hallucinations and substance abuse. The patient is not nervous/anxious and does not have insomnia.      Past Medical History  Diagnosis Date  .  Bronchiectasis with acute exacerbation 05/16/14    acute respiratory failure   . Other and unspecified hyperlipidemia   . TIA (transient ischemic attack)   . Osteoporosis, unspecified   . Asthma   . Esophageal reflux   . IBS (irritable bowel syndrome)   . Diverticulosis   . Hemorrhoids   . Hypothyroidism   . Schatzki's ring   . Hiatal hernia   . Tubular adenoma of colon 12/2008  . Chronic respiratory failure 05/16/14  . HCAP (healthcare-associated pneumonia) 05/16/14  . Sinusitis   . GERD (gastroesophageal reflux disease)   . HTN (hypertension)   . Encephalopathy 05/16/14    noted during hospitalization  . Depressive disorder, not elsewhere classified   . Diastolic dysfunction, left ventricle 05/16/14  . Memory loss   . Pubic ramus fracture   . Hiatal hernia   . Tubular adenoma of colon   .  Hypoxia   . Anemia, unspecified   . Constipation    Past Surgical History  Procedure Laterality Date  . Cholecystectomy  1995  . Nasal sinus surgery    . Tonsillectomy    . Foot surgery    . Small intestine surgery  05/2008   Social History:   reports that she has never smoked. She has never used smokeless tobacco. She reports that she does not drink alcohol or use illicit drugs.  Family History  Problem Relation Age of Onset  . Hypertension Sister   . Heart disease Mother   . Heart disease Father   . Stroke Father   . Multiple myeloma Maternal Aunt   . Colon cancer Mother     Medications: Patient's Medications  New Prescriptions   No medications on file  Previous Medications   ALBUTEROL (PROVENTIL) (2.5 MG/3ML) 0.083% NEBULIZER SOLUTION    Take 2.5 mg by nebulization every 3 (three) hours as needed for wheezing or shortness of breath.   ALPRAZOLAM (XANAX) 0.25 MG TABLET    Take 1 tablet (0.25 mg total) by mouth 2 (two) times daily as needed for anxiety.   ARFORMOTEROL (BROVANA) 15 MCG/2ML NEBU    Take 15 mcg by nebulization every 12 (twelve) hours.   BISOPROLOL (ZEBETA) 5 MG  TABLET    Take 2.5 mg by mouth every morning.    BUDESONIDE (PULMICORT) 0.5 MG/2ML NEBULIZER SOLUTION    Take 0.5 mg by nebulization 2 (two) times daily.   CALCIUM-VITAMIN D (OSCAL WITH D) 500-200 MG-UNIT PER TABLET    Take 1 tablet by mouth 2 (two) times daily.     CETIRIZINE (ZYRTEC) 10 MG TABLET    Take 10 mg by mouth at bedtime.   CLOPIDOGREL (PLAVIX) 75 MG TABLET    Take 75 mg by mouth every morning.    FAMOTIDINE (PEPCID) 20 MG TABLET    Take 20 mg by mouth at bedtime.    FEEDING SUPPLEMENT, RESOURCE BREEZE, (RESOURCE BREEZE) LIQD    Take 90 mLs by mouth 3 (three) times daily between meals.   FLUTICASONE (FLONASE) 50 MCG/ACT NASAL SPRAY    Place 2 sprays into both nostrils every morning.    GUAIFENESIN (MUCINEX) 600 MG 12 HR TABLET    Take 600 mg by mouth 2 (two) times daily.   LATANOPROST (XALATAN) 0.005 % OPHTHALMIC SOLUTION    Place 1 drop into both eyes at bedtime.    LEVOTHYROXINE (SYNTHROID, LEVOTHROID) 125 MCG TABLET    Take 125 mcg by mouth daily before breakfast.   NISOLDIPINE (SULAR) 8.5 MG 24 HR TABLET    Take 8.5 mg by mouth every morning.    POLYETHYLENE GLYCOL (MIRALAX / GLYCOLAX) PACKET    Take 17 g by mouth 2 (two) times daily.   PREDNISONE (DELTASONE) 10 MG TABLET    Take 10 mg by mouth daily with breakfast.   PROBIOTIC PRODUCT (PROBIOTIC DAILY) CAPS    Take 1 capsule by mouth every morning.   SERTRALINE (ZOLOFT) 25 MG TABLET    Take 25 mg by mouth every morning.   Modified Medications   No medications on file  Discontinued Medications   No medications on file     Physical Exam: Physical Exam  Constitutional: She appears well-developed and well-nourished. No distress.  HENT:  Head: Normocephalic and atraumatic.  Right Ear: External ear normal.  Left Ear: External ear normal.  Nose: Nose normal.  Mouth/Throat: Oropharynx is clear and moist. No oropharyngeal exudate.  Eyes: Conjunctivae  and EOM are normal. Pupils are equal, round, and reactive to light. Right eye  exhibits no discharge. Left eye exhibits no discharge. No scleral icterus.  Neck: Normal range of motion. Neck supple. No JVD present. No tracheal deviation present. No thyromegaly present.  Cardiovascular: Normal rate, regular rhythm, normal heart sounds and intact distal pulses.   No murmur heard. Pulmonary/Chest: No stridor. No respiratory distress. She has no wheezes. She has rales. She exhibits no tenderness.  O2 dependent. accessory muscle use. Decreased breath sound. Bubbling sound  Abdominal: She exhibits no distension and no mass. There is no tenderness. There is no rebound and no guarding.  Musculoskeletal: Normal range of motion. She exhibits tenderness. She exhibits no edema.  In right groin and buttock.   Lymphadenopathy:    She has no cervical adenopathy.  Neurological: She is alert. She has normal reflexes. No cranial nerve deficit. She exhibits normal muscle tone. Coordination normal.  Skin: Skin is warm and dry. No rash noted. She is not diaphoretic. No erythema. No pallor.  Psychiatric: She has a normal mood and affect. Her behavior is normal. Thought content normal.    Filed Vitals:   06/10/14 1104  BP: 132/74  Pulse: 66  Temp: 98.2 F (36.8 C)  TempSrc: Tympanic  Resp: 22  SpO2: 94%      Labs reviewed: Basic Metabolic Panel:  Recent Labs  05/22/14 05/23/14  05/27/14 1340 05/27/14 1414 05/28/14 0557 05/29/14 0520 06/05/14  NA  --   --   < > 129*  --  131* 133* 130*  K  --   --   < > 4.2  --  4.5 4.6 3.6  CL  --   --   --  89*  --  94* 99  --   CO2  --   --   --  25  --  25 23  --   GLUCOSE  --   --   --  125*  --  112* 108*  --   BUN  --   --   < > 16  --  $R'14 14 12  'Zo$ CREATININE  --   --   < > 0.75  --  0.71 0.61 0.6  CALCIUM  --   --   --  9.1  --  7.6* 7.5*  --   MG  --   --   --   --  2.3  --   --   --   PHOS  --   --   --   --  3.4  --   --   --   TSH 16.69* 11.06*  --   --   --   --   --   --   < > = values in this interval not displayed. Liver  Function Tests:  Recent Labs  05/11/14 1122 05/13/14 0249 05/26/14 05/27/14 1340 06/05/14  AST 40* $Remov'30 20 29 21  'DFROgf$ ALT 35 $Remo'26 13 17 14  'gHUmj$ ALKPHOS 74 52 203* 304* 257*  BILITOT 0.2* 0.2*  --  0.5  --   PROT 7.8 6.3  --  7.4  --   ALBUMIN 3.5 2.6*  --  3.3*  --    No results found for this basename: LIPASE, AMYLASE,  in the last 8760 hours No results found for this basename: AMMONIA,  in the last 8760 hours CBC:  Recent Labs  05/11/14 1122  05/13/14 0249 05/14/14 0352  05/27/14 1340 05/28/14 0557 05/29/14 4656  06/05/14  WBC 29.5*  < > 17.9* 19.1*  < > 22.7* 7.8 8.7 10.9  NEUTROABS 23.9*  --  17.3* 16.2*  --   --   --   --   --   HGB 12.7  < > 10.3* 10.3*  < > 12.5 10.3* 10.2* 11.3*  HCT 42.3  < > 33.8* 34.1*  < > 39.3 33.5* 32.8* 36  MCV 83.6  < > 80.3 80.4  < > 79.2 77.9* 79.2  --   PLT 372  < > 226 261  < > 596* 514* 518* 447*  < > = values in this interval not displayed. Lipid Panel:  Recent Labs  06/12/13 0924  CHOL 181  HDL 75.00  LDLCALC 86  TRIG 98.0  CHOLHDL 2    Past Procedures:  8/16 Dopper legs >> no DVT  8/17 Echo >> mod/severe LVH, EF 55 to 31%, grade 1 diastolic dysfx  5/17 CT chest >> borderline LAN with calcifications, patchy GGO with BTX and peribronchovascular nodularity, consolidation LLL  05/27/14 CXR IMPRESSION:  There has not been significant interval change since a study of  earlier today.  Assessment/Plan Chronic respiratory failure, unspecified whether with hypoxia or hypercapnia 06/06/14 Dr. Melvyn Novas Pulmonology: Palliative measures, continued pulmonary follow up is not needed.  O2 4lpm via [place]. No known history of COPD, most likely related to her bronchiectasis exacerbation.      Acute encephalopathy Much improved, most likely related to her hypoxia and Hypercapnia.back to baseline.    Acute respiratory failure with hypoxia On 4L/min at base line, hypoxemic, sat O2 90S% No known history of COPD, most likely related to her bronchiectasis  exacerbation.      Anemia, unspecified Hgb 11.4. 05/15/14 05/27/14 Hgb 12.5 06/05/14 Hgb 11.3     BRONCHIECTASIS W/ACUTE EXACERBATION treated with IV Vancomycin and Cefepime in hospital, completed po evofloxacin total of 5 days at [facility] on her baseline of 10 mg by mouth prednisone daily.  Continue with nebulizers.  Cough and sputum production are improving.     Constipation Stable, takes Miralax bid. 06/04/14 dc Senna S    Depression Hold narcotics to avoid oversedation  Continue zoloft and PRN xanax    Depressive disorder, not elsewhere classified Hold narcotics to avoid oversedation  Continue zoloft and PRN xanax    Diastolic dysfunction, left ventricle Clinically compensated cardiac aspect.    GERD Stable. Continue Pepcid $RemoveBeforeD'20mg'[id number]$  daily.    HTN (hypertension) Hx HTN  Hx TIA  Cont plvix, bisoprolol, sular      Hyponatremia Followed as Primary Care Patient/ [facility]/ [name]  05/26/14 Na 126 06/05/14 Na 130 due to dehydration , resolved with IV fluids upon hospital discharge.   HYPOTHYROIDISM 05/22/14 16.686 05/23/14 TSH 11.055 Increase Levothyroxine 139mcg-update TSH 6 weeks.     Hypothyroidism 05/22/14 16.686 05/23/14 TSH 11.055 Increase Levothyroxine 163mcg-update TSH 6 weeks.      Hypoxia at O2 90s % with O2 4lmp via [place]. No longer f/u Pulmonology recommended. Hospice Referral.  Maintained on Brovana bid, Budesonide bid, and Prednisone $RemoveBefore'10mg'[id number]$      Left ventricular diastolic dysfunction, NYHA class 1 8/17 Echo >> mod/severe LVH, EF 55 to 61%, grade 1 diastolic dysfx. Gentle diuresed while in hospital.     Unspecified constipation Stable off Senokot S II bid  and continued Miralax bid   Pubic ramus fracture PT  Ultram for pain  The right   Memory deficit  Multiple factorial: narcotic prior to hospitalization, chronic hypoxemia, acute pelvic fx, and  PNA. Recommended Hospice Service.     Leukocytosis WBC ? Elevated on chronic  steroids (12-16k up/down )  06/05/14 wbc 10.9     Family/ Staff Communication: observe the patient. Hospice Referral.   Goals of Care: SNF  Labs/tests ordered: TSH 6 weeks.

## 2014-06-10 NOTE — Assessment & Plan Note (Signed)
Followed as Primary Care Patient/ [facility]/ [name]  05/26/14 Na 126 06/05/14 Na 130 due to dehydration , resolved with IV fluids upon hospital discharge.

## 2014-06-10 NOTE — Assessment & Plan Note (Addendum)
On 4L/min at base line, hypoxemic, sat O2 90S% No known history of COPD, most likely related to her bronchiectasis exacerbation.

## 2014-06-10 NOTE — Assessment & Plan Note (Signed)
treated with IV Vancomycin and Cefepime in hospital, completed po evofloxacin total of 5 days at SNF on her baseline of 10 mg by mouth prednisone daily.  Continue with nebulizers.  Cough and sputum production are improving.

## 2014-06-13 ENCOUNTER — Encounter: Payer: Self-pay | Admitting: Nurse Practitioner

## 2014-06-13 ENCOUNTER — Non-Acute Institutional Stay (SKILLED_NURSING_FACILITY): Payer: Medicare Other | Admitting: Nurse Practitioner

## 2014-06-13 DIAGNOSIS — K5900 Constipation, unspecified: Secondary | ICD-10-CM

## 2014-06-13 DIAGNOSIS — J471 Bronchiectasis with (acute) exacerbation: Secondary | ICD-10-CM

## 2014-06-13 DIAGNOSIS — D649 Anemia, unspecified: Secondary | ICD-10-CM

## 2014-06-13 DIAGNOSIS — E039 Hypothyroidism, unspecified: Secondary | ICD-10-CM

## 2014-06-13 DIAGNOSIS — E871 Hypo-osmolality and hyponatremia: Secondary | ICD-10-CM

## 2014-06-13 DIAGNOSIS — I5189 Other ill-defined heart diseases: Secondary | ICD-10-CM

## 2014-06-13 DIAGNOSIS — I519 Heart disease, unspecified: Secondary | ICD-10-CM

## 2014-06-13 DIAGNOSIS — D72829 Elevated white blood cell count, unspecified: Secondary | ICD-10-CM

## 2014-06-13 DIAGNOSIS — S32591D Other specified fracture of right pubis, subsequent encounter for fracture with routine healing: Secondary | ICD-10-CM

## 2014-06-13 DIAGNOSIS — IMO0001 Reserved for inherently not codable concepts without codable children: Secondary | ICD-10-CM

## 2014-06-13 DIAGNOSIS — G9340 Encephalopathy, unspecified: Secondary | ICD-10-CM

## 2014-06-13 DIAGNOSIS — R413 Other amnesia: Secondary | ICD-10-CM

## 2014-06-13 DIAGNOSIS — I10 Essential (primary) hypertension: Secondary | ICD-10-CM

## 2014-06-13 DIAGNOSIS — F329 Major depressive disorder, single episode, unspecified: Secondary | ICD-10-CM

## 2014-06-13 DIAGNOSIS — F3289 Other specified depressive episodes: Secondary | ICD-10-CM

## 2014-06-13 DIAGNOSIS — K219 Gastro-esophageal reflux disease without esophagitis: Secondary | ICD-10-CM

## 2014-06-13 DIAGNOSIS — R0902 Hypoxemia: Secondary | ICD-10-CM

## 2014-06-13 DIAGNOSIS — J9610 Chronic respiratory failure, unspecified whether with hypoxia or hypercapnia: Secondary | ICD-10-CM

## 2014-06-13 NOTE — Assessment & Plan Note (Signed)
PT  Ultram for pain  The right

## 2014-06-13 NOTE — Assessment & Plan Note (Signed)
at O2 90s % with O2 4lmp via [place]. No longer f/u Pulmonology recommended. Hospice Referral.  Maintained on Brovana bid, Budesonide bid, and Prednisone 10mg 

## 2014-06-13 NOTE — Assessment & Plan Note (Signed)
Stable off Senokot S II bid  and continued Miralax bid

## 2014-06-13 NOTE — Assessment & Plan Note (Signed)
WBC ? Elevated on chronic steroids (12-16k up/down )  06/05/14 wbc 10.9

## 2014-06-13 NOTE — Assessment & Plan Note (Signed)
Hold narcotics to avoid oversedation  Continue zoloft and PRN xanax  06/13/14 staff reported the patient is more depression and exhibits anxiousness. Will increase Zoloft to 50mg  observe the patient

## 2014-06-13 NOTE — Progress Notes (Signed)
Patient ID: [name], female   DOB: [birth year], 80 y.o.   MRN: [id number]   Code Status: DNR MOST comfort measures. ABT and IVF if indicated.   Allergies  Allergen Reactions  . Chocolate Cough  . Codeine     Unknown reaction  . Erythromycin Ethylsuccinate     REACTION: unspecified  . Iodine   . Iohexol      Desc: pt states allergy many years ago--unable to remember type of allergy--pre med in future slg 06/13/08   . Sulfamethoxazole     REACTION: unspecified  . Septra [Bactrim] Itching and Rash    Chief Complaint  Patient presents with  . Medical Management of Chronic Issues  . Acute Visit    depression.     HPI: Patient is a 80 y.o. female seen in the SNF at [facility] today for evaluation of depression/anxiety  and other chronic medical conditions.    05/27/2014-05/30/2014 Bronchiectasis with acute exacerbation/HCAP (healthcare-associated pneumonia) and encephalopathy. Patient was sent from [facility] following prior hospitalization to Dr. Melvyn Novas office because of SOB and altered mental status.  She was subsequently sent to ED. In the ED chest x-ray showed no acute changes with coarse interstitial lung markings in the mid and lower lung zones bilaterally. WBC 22.7, sodium of 129, proBNP 528    Hospitalized 05/11/2014-05/16/2014 for HCAP , R pubic ramus fx-PRN tramadol , resolved acute encephalopathy 2nd to medications and respiratory failure-Respiratory failurerespiratory improved with narcan, bipap, nebs, IV abx and solumedrol and required no further bipap. She received no further narcotics and pain has been well   Problem List Items Addressed This Visit   Unspecified constipation     Stable off Senokot S II bid  and continued Miralax bid     Pubic ramus fracture     PT  Ultram for pain  The right     Memory deficit     Multiple factorial: narcotic prior to hospitalization, chronic hypoxemia, acute pelvic fx, and PNA. Recommended Hospice Service.    Leukocytosis     WBC ? Elevated on chronic steroids (12-16k up/down )  06/05/14 wbc 10.9     Left ventricular diastolic dysfunction, NYHA class 1     8/17 Echo >> mod/severe LVH, EF 55 to 23%, grade 1 diastolic dysfx. Gentle diuresed while in hospital.        Hypoxia     at O2 90s % with O2 4lmp via [place]. No longer f/u Pulmonology recommended. Hospice Referral.  Maintained on Brovana bid, Budesonide bid, and Prednisone $RemoveBefore'10mg'LDIloKHrsYuwQ$         Hypothyroidism     /27/15 16.686 05/23/14 TSH 11.055 Increased Levothyroxine 159mcg-update TSH 6 weeks.         Hyponatremia     Followed as Primary Care Patient/ [facility]/ [name]  05/26/14 Na 126 06/05/14 Na 130 due to dehydration , resolved with IV fluids upon hospital discharge    HTN (hypertension)     Hx HTN  Hx TIA  Cont plvix, bisoprolol, sular       GERD     Stable. Continue Pepcid $RemoveBeforeD'20mg'gWsXabWqyifWeg$  daily.      Diastolic dysfunction, left ventricle     Clinically compensated cardiac aspect.       Depressive disorder, not elsewhere classified - Primary     Hold narcotics to avoid oversedation  Continue zoloft and PRN xanax  06/13/14 staff reported the patient is more depression and exhibits anxiousness. Will increase Zoloft to $RemoveB'50mg'LzgNExOR$  observe the patient  Relevant Medications      sertraline (ZOLOFT) 50 MG tablet   Chronic respiratory failure, unspecified whether with hypoxia or hypercapnia     06/06/14 Dr. Melvyn Novas Pulmonology: Palliative measures, continued pulmonary follow up is not needed.  O2 4lpm via [place]. No known history of COPD, most likely related to her bronchiectasis exacerbation.       BRONCHIECTASIS W/ACUTE EXACERBATION     treated with IV Vancomycin and Cefepime in hospital, completed po evofloxacin total of 5 days at SNF on her baseline of 10 mg by mouth prednisone daily.  Continue with nebulizers.  Cough and sputum production are improving.  Hospice referral       Anemia, unspecified     Hgb 11.4. 05/15/14 05/27/14 Hgb  12.5 06/05/14 Hgb 11.3      Acute encephalopathy     Much improved, most likely related to her hypoxia and Hypercapnia.back to baseline.         Review of Systems:  Review of Systems  Constitutional: Positive for malaise/fatigue. Negative for fever, chills, weight loss and diaphoresis.       Generalized.   HENT: Positive for hearing loss. Negative for congestion, ear discharge, ear pain, nosebleeds, sore throat and tinnitus.   Eyes: Negative for blurred vision, double vision, photophobia, pain, discharge and redness.  Respiratory: Positive for cough and shortness of breath. Negative for hemoptysis, sputum production, wheezing and stridor.   Cardiovascular: Positive for PND. Negative for chest pain, palpitations, orthopnea, claudication and leg swelling.  Gastrointestinal: Positive for constipation. Negative for heartburn, nausea, vomiting, abdominal pain, diarrhea, blood in stool and melena.  Genitourinary: Negative for dysuria, urgency, frequency, hematuria and flank pain.  Musculoskeletal: Positive for falls and joint pain. Negative for back pain, myalgias and neck pain.       Right groin  Skin: Negative for itching and rash.  Neurological: Positive for weakness. Negative for dizziness, tingling, tremors, sensory change, speech change, focal weakness, seizures, loss of consciousness and headaches.  Endo/Heme/Allergies: Negative for environmental allergies and polydipsia. Does not bruise/bleed easily.  Psychiatric/Behavioral: Positive for depression and memory loss. Negative for suicidal ideas, hallucinations and substance abuse. The patient is nervous/anxious. The patient does not have insomnia.      Past Medical History  Diagnosis Date  . Bronchiectasis with acute exacerbation 05/16/14    acute respiratory failure   . Other and unspecified hyperlipidemia   . TIA (transient ischemic attack)   . Osteoporosis, unspecified   . Asthma   . Esophageal reflux   . IBS (irritable bowel  syndrome)   . Diverticulosis   . Hemorrhoids   . Hypothyroidism   . Schatzki's ring   . Hiatal hernia   . Tubular adenoma of colon 12/2008  . Chronic respiratory failure 05/16/14  . HCAP (healthcare-associated pneumonia) 05/16/14  . Sinusitis   . GERD (gastroesophageal reflux disease)   . HTN (hypertension)   . Encephalopathy 05/16/14    noted during hospitalization  . Depressive disorder, not elsewhere classified   . Diastolic dysfunction, left ventricle 05/16/14  . Memory loss   . Pubic ramus fracture   . Hiatal hernia   . Tubular adenoma of colon   . Hypoxia   . Anemia, unspecified   . Constipation    Past Surgical History  Procedure Laterality Date  . Cholecystectomy  1995  . Nasal sinus surgery    . Tonsillectomy    . Foot surgery    . Small intestine surgery  05/2008   Social History:  reports that she has never smoked. She has never used smokeless tobacco. She reports that she does not drink alcohol or use illicit drugs.  Family History  Problem Relation Age of Onset  . Hypertension Sister   . Heart disease Mother   . Heart disease Father   . Stroke Father   . Multiple myeloma Maternal Aunt   . Colon cancer Mother     Medications: Patient's Medications  New Prescriptions   No medications on file  Previous Medications   ALBUTEROL (PROVENTIL) (2.5 MG/3ML) 0.083% NEBULIZER SOLUTION    Take 2.5 mg by nebulization every 3 (three) hours as needed for wheezing or shortness of breath.   ALPRAZOLAM (XANAX) 0.25 MG TABLET    Take 1 tablet (0.25 mg total) by mouth 2 (two) times daily as needed for anxiety.   ARFORMOTEROL (BROVANA) 15 MCG/2ML NEBU    Take 15 mcg by nebulization every 12 (twelve) hours.   BISOPROLOL (ZEBETA) 5 MG TABLET    Take 2.5 mg by mouth every morning.    BUDESONIDE (PULMICORT) 0.5 MG/2ML NEBULIZER SOLUTION    Take 0.5 mg by nebulization 2 (two) times daily.   CALCIUM-VITAMIN D (OSCAL WITH D) 500-200 MG-UNIT PER TABLET    Take 1 tablet by mouth 2  (two) times daily.     CETIRIZINE (ZYRTEC) 10 MG TABLET    Take 10 mg by mouth at bedtime.   CLOPIDOGREL (PLAVIX) 75 MG TABLET    Take 75 mg by mouth every morning.    FAMOTIDINE (PEPCID) 20 MG TABLET    Take 20 mg by mouth at bedtime.    FEEDING SUPPLEMENT, RESOURCE BREEZE, (RESOURCE BREEZE) LIQD    Take 90 mLs by mouth 3 (three) times daily between meals.   FLUTICASONE (FLONASE) 50 MCG/ACT NASAL SPRAY    Place 2 sprays into both nostrils every morning.    GUAIFENESIN (MUCINEX) 600 MG 12 HR TABLET    Take 600 mg by mouth 2 (two) times daily.   LATANOPROST (XALATAN) 0.005 % OPHTHALMIC SOLUTION    Place 1 drop into both eyes at bedtime.    LEVOTHYROXINE (SYNTHROID, LEVOTHROID) 125 MCG TABLET    Take 125 mcg by mouth daily before breakfast.   NISOLDIPINE (SULAR) 8.5 MG 24 HR TABLET    Take 8.5 mg by mouth every morning.    POLYETHYLENE GLYCOL (MIRALAX / GLYCOLAX) PACKET    Take 17 g by mouth 2 (two) times daily.   PREDNISONE (DELTASONE) 10 MG TABLET    Take 10 mg by mouth daily with breakfast.   PROBIOTIC PRODUCT (PROBIOTIC DAILY) CAPS    Take 1 capsule by mouth every morning.   SERTRALINE (ZOLOFT) 50 MG TABLET    Take 50 mg by mouth daily.  Modified Medications   No medications on file  Discontinued Medications   SERTRALINE (ZOLOFT) 25 MG TABLET    Take 25 mg by mouth every morning.      Physical Exam: Physical Exam  Constitutional: She appears well-developed and well-nourished. No distress.  HENT:  Head: Normocephalic and atraumatic.  Right Ear: External ear normal.  Left Ear: External ear normal.  Nose: Nose normal.  Mouth/Throat: Oropharynx is clear and moist. No oropharyngeal exudate.  Eyes: Conjunctivae and EOM are normal. Pupils are equal, round, and reactive to light. Right eye exhibits no discharge. Left eye exhibits no discharge. No scleral icterus.  Neck: Normal range of motion. Neck supple. No JVD present. No tracheal deviation present. No thyromegaly present.  Cardiovascular: Normal rate, regular rhythm, normal heart sounds and intact distal pulses.   No murmur heard. Pulmonary/Chest: No stridor. No respiratory distress. She has no wheezes. She has rales. She exhibits no tenderness.  O2 dependent. accessory muscle use. Decreased breath sound. Bubbling sound  Abdominal: She exhibits no distension and no mass. There is no tenderness. There is no rebound and no guarding.  Musculoskeletal: Normal range of motion. She exhibits tenderness. She exhibits no edema.  In right groin and buttock.   Lymphadenopathy:    She has no cervical adenopathy.  Neurological: She is alert. She has normal reflexes. No cranial nerve deficit. She exhibits normal muscle tone. Coordination normal.  Skin: Skin is warm and dry. No rash noted. She is not diaphoretic. No erythema. No pallor.  Psychiatric: Her behavior is normal. Thought content normal.  Depression/anxiety    Filed Vitals:   06/13/14 1344  BP: 148/82  Pulse: 84  Temp: 98.7 F (37.1 C)  TempSrc: Tympanic  Resp: 20      Labs reviewed: Basic Metabolic Panel:  Recent Labs  05/22/14 05/23/14  05/27/14 1340 05/27/14 1414 05/28/14 0557 05/29/14 0520 06/05/14  NA  --   --   < > 129*  --  131* 133* 130*  K  --   --   < > 4.2  --  4.5 4.6 3.6  CL  --   --   --  89*  --  94* 99  --   CO2  --   --   --  25  --  25 23  --   GLUCOSE  --   --   --  125*  --  112* 108*  --   BUN  --   --   < > 16  --  $R'14 14 12  'vk$ CREATININE  --   --   < > 0.75  --  0.71 0.61 0.6  CALCIUM  --   --   --  9.1  --  7.6* 7.5*  --   MG  --   --   --   --  2.3  --   --   --   PHOS  --   --   --   --  3.4  --   --   --   TSH 16.69* 11.06*  --   --   --   --   --   --   < > = values in this interval not displayed. Liver Function Tests:  Recent Labs  05/11/14 1122 05/13/14 0249 05/26/14 05/27/14 1340 06/05/14  AST 40* $Remov'30 20 29 21  'KRCeQG$ ALT 35 $Remo'26 13 17 14  'TnFFV$ ALKPHOS 74 52 203* 304* 257*  BILITOT 0.2* 0.2*  --  0.5  --   PROT 7.8  6.3  --  7.4  --   ALBUMIN 3.5 2.6*  --  3.3*  --    No results found for this basename: LIPASE, AMYLASE,  in the last 8760 hours No results found for this basename: AMMONIA,  in the last 8760 hours CBC:  Recent Labs  05/11/14 1122  05/13/14 0249 05/14/14 0352  05/27/14 1340 05/28/14 0557 05/29/14 0520 06/05/14  WBC 29.5*  < > 17.9* 19.1*  < > 22.7* 7.8 8.7 10.9  NEUTROABS 23.9*  --  17.3* 16.2*  --   --   --   --   --   HGB 12.7  < > 10.3* 10.3*  < > 12.5 10.3* 10.2*  11.3*  HCT 42.3  < > 33.8* 34.1*  < > 39.3 33.5* 32.8* 36  MCV 83.6  < > 80.3 80.4  < > 79.2 77.9* 79.2  --   PLT 372  < > 226 261  < > 596* 514* 518* 447*  < > = values in this interval not displayed. Lipid Panel: No results found for this basename: CHOL, HDL, LDLCALC, TRIG, CHOLHDL, LDLDIRECT,  in the last 8760 hours  Past Procedures:  8/16 Dopper legs >> no DVT  8/17 Echo >> mod/severe LVH, EF 55 to 14%, grade 1 diastolic dysfx  7/82 CT chest >> borderline LAN with calcifications, patchy GGO with BTX and peribronchovascular nodularity, consolidation LLL  05/27/14 CXR IMPRESSION:  There has not been significant interval change since a study of  earlier today.  Assessment/Plan Depressive disorder, not elsewhere classified Hold narcotics to avoid oversedation  Continue zoloft and PRN xanax  06/13/14 staff reported the patient is more depression and exhibits anxiousness. Will increase Zoloft to $RemoveB'50mg'tMMiPJRv$  observe the patient    Unspecified constipation Stable off Senokot S II bid  and continued Miralax bid   Pubic ramus fracture PT  Ultram for pain  The right   Memory deficit Multiple factorial: narcotic prior to hospitalization, chronic hypoxemia, acute pelvic fx, and PNA. Recommended Hospice Service.  Leukocytosis WBC ? Elevated on chronic steroids (12-16k up/down )  06/05/14 wbc 10.9   Left ventricular diastolic dysfunction, NYHA class 1 8/17 Echo >> mod/severe LVH, EF 55 to 95%, grade 1 diastolic  dysfx. Gentle diuresed while in hospital.      Hypoxia at O2 90s % with O2 4lmp via [place]. No longer f/u Pulmonology recommended. Hospice Referral.  Maintained on Brovana bid, Budesonide bid, and Prednisone $RemoveBefore'10mg'RqFtjRULYAyTl$       Hypothyroidism /27/15 16.686 05/23/14 TSH 11.055 Increased Levothyroxine 153mcg-update TSH 6 weeks.       Hyponatremia Followed as Primary Care Patient/ [facility]/ [name]  05/26/14 Na 126 06/05/14 Na 130 due to dehydration , resolved with IV fluids upon hospital discharge  HTN (hypertension) Hx HTN  Hx TIA  Cont plvix, bisoprolol, sular     GERD Stable. Continue Pepcid $RemoveBeforeD'20mg'jgIbIyQMOvhBwF$  daily.    Diastolic dysfunction, left ventricle Clinically compensated cardiac aspect.     BRONCHIECTASIS W/ACUTE EXACERBATION treated with IV Vancomycin and Cefepime in hospital, completed po evofloxacin total of 5 days at SNF on her baseline of 10 mg by mouth prednisone daily.  Continue with nebulizers.  Cough and sputum production are improving.  Hospice referral     Chronic respiratory failure, unspecified whether with hypoxia or hypercapnia 06/06/14 Dr. Melvyn Novas Pulmonology: Palliative measures, continued pulmonary follow up is not needed.  O2 4lpm via [place]. No known history of COPD, most likely related to her bronchiectasis exacerbation.     Anemia, unspecified Hgb 11.4. 05/15/14 05/27/14 Hgb 12.5 06/05/14 Hgb 11.3    Acute encephalopathy Much improved, most likely related to her hypoxia and Hypercapnia.back to baseline.      Family/ Staff Communication: observe the patient. Hospice Referral.   Goals of Care: SNF  Labs/tests ordered: TSH 6 weeks.

## 2014-06-13 NOTE — Assessment & Plan Note (Signed)
treated with IV Vancomycin and Cefepime in hospital, completed po evofloxacin total of 5 days at SNF on her baseline of 10 mg by mouth prednisone daily.  Continue with nebulizers.  Cough and sputum production are improving.  Hospice referral

## 2014-06-13 NOTE — Assessment & Plan Note (Signed)
Much improved, most likely related to her hypoxia and Hypercapnia.back to baseline.

## 2014-06-13 NOTE — Assessment & Plan Note (Signed)
06/06/14 Dr. Melvyn Novas Pulmonology: Palliative measures, continued pulmonary follow up is not needed.  O2 4lpm via [place]. No known history of COPD, most likely related to her bronchiectasis exacerbation.

## 2014-06-13 NOTE — Assessment & Plan Note (Signed)
Stable.  Continue Pepcid 20mg daily

## 2014-06-13 NOTE — Assessment & Plan Note (Signed)
Hx HTN  Hx TIA  Cont plvix, bisoprolol, sular

## 2014-06-13 NOTE — Assessment & Plan Note (Signed)
Hgb 11.4. 05/15/14 05/27/14 Hgb 12.5 06/05/14 Hgb 11.3

## 2014-06-13 NOTE — Assessment & Plan Note (Signed)
8/17 Echo >> mod/severe LVH, EF 55 to 60%, grade 1 diastolic dysfx. Gentle diuresed while in hospital.

## 2014-06-13 NOTE — Assessment & Plan Note (Signed)
Clinically compensated cardiac aspect.

## 2014-06-13 NOTE — Assessment & Plan Note (Signed)
/  27/15 16.686 05/23/14 TSH 11.055 Increased Levothyroxine 171mcg-update TSH 6 weeks.

## 2014-06-13 NOTE — Assessment & Plan Note (Signed)
Multiple factorial: narcotic prior to hospitalization, chronic hypoxemia, acute pelvic fx, and PNA. Recommended Hospice Service.

## 2014-06-13 NOTE — Assessment & Plan Note (Signed)
Followed as Primary Care Patient/ [facility]/ [name]  05/26/14 Na 126 06/05/14 Na 130 due to dehydration , resolved with IV fluids upon hospital discharge

## 2014-06-22 ENCOUNTER — Encounter: Payer: Self-pay | Admitting: Internal Medicine

## 2014-06-22 NOTE — Assessment & Plan Note (Signed)
Xolair attempted 6/04 -> 05/2004  - Immunotherapy stopped 8/09  - Started xyflo 8/09 > 9/09 no benefit  - VEST 2008 no benefit  - Flutter valve helpful March 17, 2009  - Allergy profile April 12, 2010 >> IgE 30 unremarkable  - Dulera 200 added November 22, 2010 > could not tolerate -Sputum cx 08/29/2011 +ESCHERICHIA COLI mixed sensitivities > flare tx w/ augmentin (d/t sulfa all)   -perfomist 02/17/2012 > no benefit > rechallenged  12/27/2012  - Prevnar given 02/10/2014   I had an extended discussion with the patient today lasting 15 to 20 minutes of a 25 minute visit on the following issues:  Nothing more to add to her care at this point but would strongly rec hospice approach rather than repeat admits at this point

## 2014-06-22 NOTE — Assessment & Plan Note (Signed)
Patient Saturations on Room Air at Rest = 85%  12-19-11     - 02/17/2012  Walked 3lpm pulsed  x 1 laps @ 185 ft each stopped due to sob and sat 89% - 10/28/2013 on 4lpm pusled desat's walking 10 ft > changed on 4lpm continuous s desats       -rx =  3lpm at hs and with activity within room,  4lpm continuous with activity outside the room (no pulsed)   Adequate control on present rx, reviewed > no change in rx needed

## 2014-07-01 ENCOUNTER — Non-Acute Institutional Stay (SKILLED_NURSING_FACILITY): Payer: Medicare Other | Admitting: Adult Health

## 2014-07-01 ENCOUNTER — Encounter: Payer: Self-pay | Admitting: Adult Health

## 2014-07-01 DIAGNOSIS — I10 Essential (primary) hypertension: Secondary | ICD-10-CM

## 2014-07-01 DIAGNOSIS — J9610 Chronic respiratory failure, unspecified whether with hypoxia or hypercapnia: Secondary | ICD-10-CM

## 2014-07-01 DIAGNOSIS — E039 Hypothyroidism, unspecified: Secondary | ICD-10-CM

## 2014-07-01 DIAGNOSIS — I5189 Other ill-defined heart diseases: Secondary | ICD-10-CM

## 2014-07-01 DIAGNOSIS — K219 Gastro-esophageal reflux disease without esophagitis: Secondary | ICD-10-CM

## 2014-07-01 DIAGNOSIS — I519 Heart disease, unspecified: Secondary | ICD-10-CM

## 2014-07-01 DIAGNOSIS — G459 Transient cerebral ischemic attack, unspecified: Secondary | ICD-10-CM

## 2014-07-01 NOTE — Progress Notes (Signed)
[facility] [place]    Code Status: DNR  Allergies  Allergen Reactions  . Chocolate Cough  . Codeine     Unknown reaction  . Erythromycin Ethylsuccinate     REACTION: unspecified  . Iodine   . Iohexol      Desc: pt states allergy many years ago--unable to remember type of allergy--pre med in future slg 06/13/08   . Sulfamethoxazole     REACTION: unspecified  . Septra [Bactrim] Itching and Rash    Chief Complaint: Medical Management of Chronic Health Issues and Acute visit for elevated TSH  HPI:  Ms. Benett is a 80 yr old female being seen today for a routine visit and management for her elevated TSH.  Overall pt is doing well. Staff reports no concerning issues with patient. She remains active in ADLs. Today she is getting [facility]. She does have chronic shortness of breath and wear continuous O2, she does report this is about the same and not worse than her normal. She denies chest pain or pain.   Her last TSH was 16.69 (05/22/14); it remains elevated at 14.489. She denies any increased weakness or fatigue, hair loss, or weight loss. She does endorse that she having some heat/cold intolerance.   Review of Systems:  Constitutional: Negative for fever, chills, weight loss, malaise/fatigue and diaphoresis.   Respiratory: Positive chronic shortness of breath and dry cough. Negative for sputum production or difficulty breathing.    Cardiovascular: Negative for chest pain, palpitations, orthopnea and leg swelling.  Gastrointestinal: Positive for constipation.  Negative for heartburn, nausea, vomiting, abdominal pain, diarrhea. Genitourinary: Negative for dysuria, urgency, frequency, hematuria and flank pain.  Musculoskeletal: Positive joint pain. Negative for back pain, falls, and myalgias.  Skin: Positive skin tear to right shin. Negative for itching and rash.  Neurological: Negative for weakness,dizziness, tingling, focal weakness and headaches.    Psychiatric/Behavioral: Positive depression and memory loss. The patient is not nervous/anxious.     Past Medical History  Diagnosis Date  . Bronchiectasis with acute exacerbation 05/16/14    acute respiratory failure   . Other and unspecified hyperlipidemia   . TIA (transient ischemic attack)   . Osteoporosis, unspecified   . Asthma   . Esophageal reflux   . IBS (irritable bowel syndrome)   . Diverticulosis   . Hemorrhoids   . Hypothyroidism   . Schatzki's ring   . Hiatal hernia   . Tubular adenoma of colon 12/2008  . Chronic respiratory failure 05/16/14  . HCAP (healthcare-associated pneumonia) 05/16/14  . Sinusitis   . GERD (gastroesophageal reflux disease)   . HTN (hypertension)   . Encephalopathy 05/16/14    noted during hospitalization  . Depressive disorder, not elsewhere classified   . Diastolic dysfunction, left ventricle 05/16/14  . Memory loss   . Pubic ramus fracture   . Hiatal hernia   . Tubular adenoma of colon   . Hypoxia   . Anemia, unspecified   . Constipation    Past Surgical History  Procedure Laterality Date  . Cholecystectomy  1995  . Nasal sinus surgery    . Tonsillectomy    . Foot surgery    . Small intestine surgery  05/2008   Social History:   reports that she has never smoked. She has never used smokeless tobacco. She reports that she does not drink alcohol or use illicit drugs.  Family History  Problem Relation Age of Onset  . Hypertension Sister   .  Heart disease Mother   . Heart disease Father   . Stroke Father   . Multiple myeloma Maternal Aunt   . Colon cancer Mother     Medications:   Medication List       This list is accurate as of: 07/01/14 10:37 AM.  Always use your most recent med list.               albuterol (2.5 MG/3ML) 0.083% nebulizer solution  Commonly known as:  PROVENTIL  Take 2.5 mg by nebulization every 3 (three) hours as needed for wheezing or shortness of breath.     ALPRAZolam 0.25 MG tablet  Commonly  known as:  XANAX  Take 1 tablet (0.25 mg total) by mouth 2 (two) times daily as needed for anxiety.     arformoterol 15 MCG/2ML Nebu  Commonly known as:  BROVANA  Take 15 mcg by nebulization every 12 (twelve) hours.     bisoprolol 5 MG tablet  Commonly known as:  ZEBETA  Take 2.5 mg by mouth every morning.     budesonide 0.5 MG/2ML nebulizer solution  Commonly known as:  PULMICORT  Take 0.5 mg by nebulization 2 (two) times daily.     calcium-vitamin D 500-200 MG-UNIT per tablet  Commonly known as:  OSCAL WITH D  Take 1 tablet by mouth 2 (two) times daily.     cetirizine 10 MG tablet  Commonly known as:  ZYRTEC  Take 10 mg by mouth at bedtime.     clopidogrel 75 MG tablet  Commonly known as:  PLAVIX  Take 75 mg by mouth every morning.     famotidine 20 MG tablet  Commonly known as:  PEPCID  Take 20 mg by mouth at bedtime.     feeding supplement (RESOURCE BREEZE) Liqd  Take 90 mLs by mouth 3 (three) times daily between meals.     fluticasone 50 MCG/ACT nasal spray  Commonly known as:  FLONASE  Place 2 sprays into both nostrils every morning.     guaiFENesin 600 MG 12 hr tablet  Commonly known as:  MUCINEX  Take 600 mg by mouth 2 (two) times daily.     latanoprost 0.005 % ophthalmic solution  Commonly known as:  XALATAN  Place 1 drop into both eyes at bedtime.     levothyroxine 125 MCG tablet  Commonly known as:  SYNTHROID, LEVOTHROID  Take 125 mcg by mouth daily before breakfast.     nisoldipine 8.5 MG 24 hr tablet  Commonly known as:  SULAR  Take 8.5 mg by mouth every morning.     polyethylene glycol packet  Commonly known as:  MIRALAX / GLYCOLAX  Take 17 g by mouth 2 (two) times daily.     predniSONE 10 MG tablet  Commonly known as:  DELTASONE  Take 10 mg by mouth daily with breakfast.     PROBIOTIC DAILY Caps  Take 1 capsule by mouth every morning.     sertraline 50 MG tablet  Commonly known as:  ZOLOFT  Take 50 mg by mouth daily.           Physical Exam:  Filed Vitals:   07/01/14 1028  BP: 140/76  Pulse: 86  Temp: 98.4 F (36.9 C)  Resp: 22    General- elderly female in no acute distress; frail Neck- neck supple; no lymphadenopathy, no thyromegaly, no jugular vein distension Ears- left ear normal tympanic membrane and normal external ear canal , right ear normal tympanic membrane  and normal external ear canal Cardiovascular- normal s1,s2, no murmurs/ rubs/ gallops; no leg edema Respiratory- bilateral faint expiratory wheezing to upper lobes; clear to lower lobes; no rhonchi, no crackles, no use of accessory muscles; wear continuous O2 Abdomen- bowel sounds present, soft, non tender, no organomegaly, no abdominal bruits Musculoskeletal- able to move all 4 extremities, walker assist; normal range of motion; 4/5 mm strength Neurological- no focal deficit, she is alert; normal mm tone; CN intact Skin- warm and dry; healing skin tear to right shin Psychiatry- alert and oriented to person, place and time, normal mood and affect    Labs reviewed: Basic Metabolic Panel:  Recent Labs  05/27/14 1340 05/27/14 1414 05/28/14 0557 05/29/14 0520 06/05/14  NA 129*  --  131* 133* 130*  K 4.2  --  4.5 4.6 3.6  CL 89*  --  94* 99  --   CO2 25  --  25 23  --   GLUCOSE 125*  --  112* 108*  --   BUN 16  --  _0 CREATININE 0.75  --  0.71 0.61 0.6  CALCIUM 9.1  --  7.6* 7.5*  --   MG  --  2.3  --   --   --   PHOS  --  3.4  --   --   --    Liver Function Tests:  Recent Labs  05/11/14 1122 05/13/14 0249 05/26/14 05/27/14 1340 06/05/14  AST 40* _1 ALT 35 _2 ALKPHOS 74 52 203* 304* 257*  BILITOT 0.2* 0.2*  --  0.5  --   PROT 7.8 6.3  --  7.4  --   ALBUMIN 3.5 2.6*  --  3.3*  --   CBC:  Recent Labs  05/11/14 1122  05/13/14 0249 05/14/14 0352  05/27/14 1340 05/28/14 0557 05/29/14 0520 06/05/14  WBC 29.5*  < > 17.9* 19.1*  < > 22.7* 7.8 8.7 10.9  NEUTROABS 23.9*  --  17.3* 16.2*   --   --   --   --   --   HGB 12.7  < > 10.3* 10.3*  < > 12.5 10.3* 10.2* 11.3*  HCT 42.3  < > 33.8* 34.1*  < > 39.3 33.5* 32.8* 36  MCV 83.6  < > 80.3 80.4  < > 79.2 77.9* 79.2  --   PLT 372  < > 226 261  < > 596* 514* 518* 447*  < > = values in this interval not displayed.  Recent Labs  05/11/14 1430 05/11/14 2020 05/12/14 0207  TROPONINI 0.67* 1.28* 1.23*   06-26-14: wbc 11.3; hgb 10.6; hct 34.2; glucose 92; bun 16; creat 0.64; Na 132; K 3.7; TSH 14.489  Past Procedures:  8/16 Dopper legs >> no DVT  8/17 Echo >> mod/severe LVH, EF 55 to 66%, grade 1 diastolic dysfx  2/94 CT chest >> borderline LAN with calcifications, patchy GGO with BTX and peribronchovascular nodularity, consolidation LLL  05/27/14 CXR IMPRESSION:  There has not been significant interval change since a study of  earlier today.   Assessment/Plan  1. Depressive disorder, not elsewhere classified: pt appears to be doing well; no new reports from staff; Will continue Zoloft 79m daily and Xanax 0.281mprn; will continue to montior  2. Unspecified constipation: no issues currently; continue Miralax bid   3. Memory deficit: stable at this time; no new changes; Being followed by Hospice Service.   4. Left ventricular diastolic  dysfunction, NYHA class 1: 8/17 Echo >> mod/severe LVH, EF 55 to 97%, grade 1 diastolic dysfx. No signs of overload at this time; will continue to montior   5. Hypoxia: chronic O2 at 4 liters; being followed by hospice;  Continued on Brovana bid, Budesonide bid, and Prednisone 38m    6. Hypothyroidism: TSH remains elevated at 14.489; will increase Levothyroxine to 1579m daily and recheck TSH in 6 weeks   7. HTN (hypertension): stable continue Bisoprolol 2.6m26maily; Sular 8.6mg23mily  8. TIA: no issues; continue plavix 76mg60mly  9. GERD: Stable. Continue Pepcid 20mg 28my.    10. Chronic respiratory failure, unspecified whether with hypoxia or hypercapnia: Hospice following pt;  continue nebulizer and continuous O2  11. Anemia, unspecified: no acute signs of bleeding noted; last hgb 10.6   Keatts, Courtney S, [facility]  Contact [phone] through Friday 8am- 5pm  After hours call [phone]

## 2014-07-08 ENCOUNTER — Encounter: Payer: Self-pay | Admitting: Gastroenterology

## 2014-07-10 DIAGNOSIS — G459 Transient cerebral ischemic attack, unspecified: Secondary | ICD-10-CM | POA: Insufficient documentation

## 2014-07-22 ENCOUNTER — Encounter: Payer: Self-pay | Admitting: Nurse Practitioner

## 2014-07-22 ENCOUNTER — Non-Acute Institutional Stay (SKILLED_NURSING_FACILITY): Payer: Medicare Other | Admitting: Nurse Practitioner

## 2014-07-22 DIAGNOSIS — J9610 Chronic respiratory failure, unspecified whether with hypoxia or hypercapnia: Secondary | ICD-10-CM

## 2014-07-22 DIAGNOSIS — J471 Bronchiectasis with (acute) exacerbation: Secondary | ICD-10-CM | POA: Diagnosis not present

## 2014-07-22 DIAGNOSIS — I519 Heart disease, unspecified: Secondary | ICD-10-CM | POA: Diagnosis not present

## 2014-07-22 DIAGNOSIS — E871 Hypo-osmolality and hyponatremia: Secondary | ICD-10-CM | POA: Diagnosis not present

## 2014-07-22 DIAGNOSIS — R413 Other amnesia: Secondary | ICD-10-CM

## 2014-07-22 DIAGNOSIS — I10 Essential (primary) hypertension: Secondary | ICD-10-CM | POA: Diagnosis not present

## 2014-07-22 DIAGNOSIS — K5900 Constipation, unspecified: Secondary | ICD-10-CM

## 2014-07-22 DIAGNOSIS — F329 Major depressive disorder, single episode, unspecified: Secondary | ICD-10-CM | POA: Diagnosis not present

## 2014-07-22 DIAGNOSIS — F32A Depression, unspecified: Secondary | ICD-10-CM

## 2014-07-22 DIAGNOSIS — K219 Gastro-esophageal reflux disease without esophagitis: Secondary | ICD-10-CM | POA: Diagnosis not present

## 2014-07-22 DIAGNOSIS — J9601 Acute respiratory failure with hypoxia: Secondary | ICD-10-CM | POA: Diagnosis not present

## 2014-07-22 LAB — BASIC METABOLIC PANEL
BUN: 18 mg/dL (ref 4–21)
Creatinine: 0.8 mg/dL (ref 0.5–1.1)
Glucose: 201 mg/dL
Potassium: 4.2 mmol/L (ref 3.4–5.3)
SODIUM: 132 mmol/L — AB (ref 137–147)

## 2014-07-22 LAB — CBC AND DIFFERENTIAL
HEMATOCRIT: 35 % — AB (ref 36–46)
Hemoglobin: 11.2 g/dL — AB (ref 12.0–16.0)
Platelets: 373 10*3/uL (ref 150–399)
WBC: 15.8 10^3/mL

## 2014-07-22 NOTE — Assessment & Plan Note (Signed)
Multiple factorial: narcotic prior to hospitalization, chronic hypoxemia, acute pelvic fx, and PNA. Recommended Hospice Service.

## 2014-07-22 NOTE — Assessment & Plan Note (Signed)
Stable off Senokot S II bid  and continued Miralax bid

## 2014-07-22 NOTE — Assessment & Plan Note (Signed)
Hx HTN  Hx TIA  Cont plvix, bisoprolol, sular

## 2014-07-22 NOTE — Assessment & Plan Note (Signed)
05/26/14 Na 126 06/05/14 Na 130 07/22/14 BMP

## 2014-07-22 NOTE — Assessment & Plan Note (Addendum)
06/06/14 Dr. Melvyn Novas Pulmonology: Palliative measures, continued pulmonary follow up is not needed.

## 2014-07-22 NOTE — Assessment & Plan Note (Signed)
tsh 0.12 on 112 so reduced to 88 mcg per day   12/26/12 - Tsh  8.6 on 88 so increased to 100 mcg per day 06/12/2013 >   4.3 08/07/13 so no change rx27/15 16.686 05/23/14 TSH 11.055 Increased Levothyroxine 137mcg-monitor TSH

## 2014-07-22 NOTE — Assessment & Plan Note (Signed)
Managing pain, SNF for care needs.

## 2014-07-22 NOTE — Assessment & Plan Note (Signed)
on her baseline of 10 mg by mouth prednisone daily.  Continue with nebulizers.  CXR. Prednisone taper dose. Supportive care

## 2014-07-22 NOTE — Assessment & Plan Note (Addendum)
O2 sat 89 % with O2 4lmp via [place]. No longer f/u Pulmonology recommended. Hospice Service  Maintained on Brovana bid, Budesonide bid, and Prednisone 10mg . Neb prn. Will add Prednisone taper dose. CXR stat. DuoNeb q6hr

## 2014-07-22 NOTE — Assessment & Plan Note (Signed)
Stable.  Continue Pepcid 20mg daily

## 2014-07-22 NOTE — Progress Notes (Signed)
Patient ID: [name], female   DOB: [birth year], 79 y.o.   MRN: [id number]   Code Status: DNR MOST comfort measures. ABT and IVF if indicated.   Allergies  Allergen Reactions  . Chocolate Cough  . Codeine     Unknown reaction  . Erythromycin Ethylsuccinate     REACTION: unspecified  . Iodine   . Iohexol      Desc: pt states allergy many years ago--unable to remember type of allergy--pre med in future slg 06/13/08   . Sulfamethoxazole     REACTION: unspecified  . Septra [Bactrim] Itching and Rash    Chief Complaint  Patient presents with  . Medical Management of Chronic Issues  . Acute Visit    productive cough and wheezes.     HPI: Patient is a 80 y.o. female seen in the SNF at [facility] today for evaluation of cough, wheezing, hypoxemia,  and other chronic medical conditions.    05/27/2014-05/30/2014 Bronchiectasis with acute exacerbation/HCAP (healthcare-associated pneumonia) and encephalopathy. Patient was sent from [facility] following prior hospitalization to Dr. Melvyn Novas office because of SOB and altered mental status.  She was subsequently sent to ED. In the ED chest x-ray showed no acute changes with coarse interstitial lung markings in the mid and lower lung zones bilaterally. WBC 22.7, sodium of 129, proBNP 528    Hospitalized 05/11/2014-05/16/2014 for HCAP , R pubic ramus fx-PRN tramadol , resolved acute encephalopathy 2nd to medications and respiratory failure-Respiratory failurerespiratory improved with narcan, bipap, nebs, IV abx and solumedrol and required no further bipap. She received no further narcotics and pain has been well   Problem List Items Addressed This Visit   Memory deficit     Multiple factorial: narcotic prior to hospitalization, chronic hypoxemia, acute pelvic fx, and PNA. Recommended Hospice Service.     Hyponatremia     05/26/14 Na 126 06/05/14 Na 130 07/22/14 BMP     HTN (hypertension)     Hx HTN  Hx TIA  Cont plvix, bisoprolol, sular        GERD     Stable. Continue Pepcid 6m daily.       Diastolic dysfunction, left ventricle     Clinically compensated cardiac aspect.       Depression     Hold narcotics to avoid oversedation  Continue zoloft and PRN xanax  06/13/14 increased Zoloft to 512mobserve the patient       Constipation     Stable off Senokot S II bid  and continued Miralax bid      Chronic respiratory failure, unspecified whether with hypoxia or hypercapnia     06/06/14 Dr. WeMelvyn Novasulmonology: Palliative measures, continued pulmonary follow up is not needed.       Bronchiectasis without acute exacerbation     on her baseline of 10 mg by mouth prednisone daily.  Continue with nebulizers.  CXR. Prednisone taper dose. Supportive care      BRONCHIECTASIS W/ACUTE EXACERBATION     8/17 CT chest >> borderline LAN with calcifications, patchy GGO with BTX and peribronchovascular nodularity, consolidation LLL  06/03/14 Mucinex 6004mid prn  06/05/14 worsened cough, congestion, confusion-Dr. WerMelvyn Novasreated with IV Vancomycin and Cefepime in hospital, [facility] of 10 mg by mouth prednisone daily.  Continue with nebulizers.  Cough and sputum production are improving.  Hospice referral         Acute respiratory failure with hypoxia - Primary  O2 sat 89 % with O2 4lmp via [place]. No longer f/u Pulmonology recommended. Hospice Service  Maintained on Brovana bid, Budesonide bid, and Prednisone 43m. Neb prn. Will add Prednisone taper dose. CXR stat. DuoNeb q6hr         Review of Systems:  Review of Systems  Constitutional: Positive for malaise/fatigue. Negative for fever, chills, weight loss and diaphoresis.       Generalized.   HENT: Positive for hearing loss. Negative for congestion, ear discharge, ear pain, nosebleeds, sore throat and tinnitus.   Eyes: Negative for blurred vision, double vision, photophobia, pain, discharge and redness.   Respiratory: Positive for cough, shortness of breath and wheezing. Negative for hemoptysis, sputum production and stridor.   Cardiovascular: Positive for PND. Negative for chest pain, palpitations, orthopnea, claudication and leg swelling.  Gastrointestinal: Positive for constipation. Negative for heartburn, nausea, vomiting, abdominal pain, diarrhea, blood in stool and melena.  Genitourinary: Negative for dysuria, urgency, frequency, hematuria and flank pain.  Musculoskeletal: Positive for falls and joint pain. Negative for back pain, myalgias and neck pain.       Right groin  Skin: Negative for itching and rash.  Neurological: Positive for weakness. Negative for dizziness, tingling, tremors, sensory change, speech change, focal weakness, seizures, loss of consciousness and headaches.  Endo/Heme/Allergies: Negative for environmental allergies and polydipsia. Does not bruise/bleed easily.  Psychiatric/Behavioral: Positive for depression and memory loss. Negative for suicidal ideas, hallucinations and substance abuse. The patient is nervous/anxious. The patient does not have insomnia.      Past Medical History  Diagnosis Date  . Bronchiectasis with acute exacerbation 05/16/14    acute respiratory failure   . Other and unspecified hyperlipidemia   . TIA (transient ischemic attack)   . Osteoporosis, unspecified   . Asthma   . Esophageal reflux   . IBS (irritable bowel syndrome)   . Diverticulosis   . Hemorrhoids   . Hypothyroidism   . Schatzki's ring   . Hiatal hernia   . Tubular adenoma of colon 12/2008  . Chronic respiratory failure 05/16/14  . HCAP (healthcare-associated pneumonia) 05/16/14  . Sinusitis   . GERD (gastroesophageal reflux disease)   . HTN (hypertension)   . Encephalopathy 05/16/14    noted during hospitalization  . Depressive disorder, not elsewhere classified   . Diastolic dysfunction, left ventricle 05/16/14  . Memory loss   . Pubic ramus fracture   . Hiatal  hernia   . Tubular adenoma of colon   . Hypoxia   . Anemia, unspecified   . Constipation    Past Surgical History  Procedure Laterality Date  . Cholecystectomy  1995  . Nasal sinus surgery    . Tonsillectomy    . Foot surgery    . Small intestine surgery  05/2008   Social History:   reports that she has never smoked. She has never used smokeless tobacco. She reports that she does not drink alcohol or use illicit drugs.  Family History  Problem Relation Age of Onset  . Hypertension Sister   . Heart disease Mother   . Heart disease Father   . Stroke Father   . Multiple myeloma Maternal Aunt   . Colon cancer Mother     Medications: Patient's Medications  New Prescriptions   No medications on file  Previous Medications   ALBUTEROL (PROVENTIL) (2.5 MG/3ML) 0.083% NEBULIZER SOLUTION    Take 2.5 mg by nebulization every 3 (three) hours as needed for wheezing or shortness of breath.  ALPRAZOLAM (XANAX) 0.25 MG TABLET    Take 1 tablet (0.25 mg total) by mouth 2 (two) times daily as needed for anxiety.   ARFORMOTEROL (BROVANA) 15 MCG/2ML NEBU    Take 15 mcg by nebulization every 12 (twelve) hours.   BISOPROLOL (ZEBETA) 5 MG TABLET    Take 2.5 mg by mouth every morning.    BUDESONIDE (PULMICORT) 0.5 MG/2ML NEBULIZER SOLUTION    Take 0.5 mg by nebulization 2 (two) times daily.   CALCIUM-VITAMIN D (OSCAL WITH D) 500-200 MG-UNIT PER TABLET    Take 1 tablet by mouth 2 (two) times daily.     CETIRIZINE (ZYRTEC) 10 MG TABLET    Take 10 mg by mouth at bedtime.   CLOPIDOGREL (PLAVIX) 75 MG TABLET    Take 75 mg by mouth every morning.    FAMOTIDINE (PEPCID) 20 MG TABLET    Take 20 mg by mouth at bedtime.    FEEDING SUPPLEMENT, RESOURCE BREEZE, (RESOURCE BREEZE) LIQD    Take 90 mLs by mouth 3 (three) times daily between meals.   FLUTICASONE (FLONASE) 50 MCG/ACT NASAL SPRAY    Place 2 sprays into both nostrils every morning.    GUAIFENESIN (MUCINEX) 600 MG 12 HR TABLET    Take 600 mg by mouth 2  (two) times daily.   LATANOPROST (XALATAN) 0.005 % OPHTHALMIC SOLUTION    Place 1 drop into both eyes at bedtime.    LEVOTHYROXINE (SYNTHROID, LEVOTHROID) 125 MCG TABLET    Take 125 mcg by mouth daily before breakfast.   NISOLDIPINE (SULAR) 8.5 MG 24 HR TABLET    Take 8.5 mg by mouth every morning.    POLYETHYLENE GLYCOL (MIRALAX / GLYCOLAX) PACKET    Take 17 g by mouth 2 (two) times daily.   PREDNISONE (DELTASONE) 10 MG TABLET    Take 10 mg by mouth daily with breakfast.   PROBIOTIC PRODUCT (PROBIOTIC DAILY) CAPS    Take 1 capsule by mouth every morning.   SERTRALINE (ZOLOFT) 50 MG TABLET    Take 50 mg by mouth daily.  Modified Medications   No medications on file  Discontinued Medications   No medications on file     Physical Exam: Physical Exam  Constitutional: She appears well-developed and well-nourished. No distress.  HENT:  Head: Normocephalic and atraumatic.  Right Ear: External ear normal.  Left Ear: External ear normal.  Nose: Nose normal.  Mouth/Throat: Oropharynx is clear and moist. No oropharyngeal exudate.  Eyes: Conjunctivae and EOM are normal. Pupils are equal, round, and reactive to light. Right eye exhibits no discharge. Left eye exhibits no discharge. No scleral icterus.  Neck: Normal range of motion. Neck supple. No JVD present. No tracheal deviation present. No thyromegaly present.  Cardiovascular: Normal rate, regular rhythm, normal heart sounds and intact distal pulses.   No murmur heard. Pulmonary/Chest: No stridor. No respiratory distress. She has wheezes. She has rales. She exhibits no tenderness.  O2 dependent. accessory muscle use. Decreased breath sound. Bubbling sound. Expiratory wheezes mostly anterior left chest.   Abdominal: She exhibits no distension and no mass. There is no tenderness. There is no rebound and no guarding.  Musculoskeletal: Normal range of motion. She exhibits tenderness. She exhibits no edema.  In right groin and buttock.    Lymphadenopathy:    She has no cervical adenopathy.  Neurological: She is alert. She has normal reflexes. No cranial nerve deficit. She exhibits normal muscle tone. Coordination normal.  Skin: Skin is warm and dry. No rash  noted. She is not diaphoretic. No erythema. No pallor.  Psychiatric: Her behavior is normal. Thought content normal.  Depression/anxiety    Filed Vitals:   07/22/14 1204  BP: 154/80  Pulse: 90  Temp: 99.2 F (37.3 C)  TempSrc: Tympanic  Resp: 24  SpO2: 89%      Labs reviewed: Basic Metabolic Panel:  Recent Labs  05/22/14 05/23/14  05/27/14 1340 05/27/14 1414 05/28/14 0557 05/29/14 0520 06/05/14  NA  --   --   < > 129*  --  131* 133* 130*  K  --   --   < > 4.2  --  4.5 4.6 3.6  CL  --   --   --  89*  --  94* 99  --   CO2  --   --   --  25  --  25 23  --   GLUCOSE  --   --   --  125*  --  112* 108*  --   BUN  --   --   < > 16  --  _0 CREATININE  --   --   < > 0.75  --  0.71 0.61 0.6  CALCIUM  --   --   --  9.1  --  7.6* 7.5*  --   MG  --   --   --   --  2.3  --   --   --   PHOS  --   --   --   --  3.4  --   --   --   TSH 16.69* 11.06*  --   --   --   --   --   --   < > = values in this interval not displayed. Liver Function Tests:  Recent Labs  05/11/14 1122 05/13/14 0249 05/26/14 05/27/14 1340 06/05/14  AST 40* _1 ALT 35 _2 ALKPHOS 74 52 203* 304* 257*  BILITOT 0.2* 0.2*  --  0.5  --   PROT 7.8 6.3  --  7.4  --   ALBUMIN 3.5 2.6*  --  3.3*  --    No results found for this basename: LIPASE, AMYLASE,  in the last 8760 hours No results found for this basename: AMMONIA,  in the last 8760 hours CBC:  Recent Labs  05/11/14 1122  05/13/14 0249 05/14/14 0352  05/27/14 1340 05/28/14 0557 05/29/14 0520 06/05/14  WBC 29.5*  < > 17.9* 19.1*  < > 22.7* 7.8 8.7 10.9  NEUTROABS 23.9*  --  17.3* 16.2*  --   --   --   --   --   HGB 12.7  < > 10.3* 10.3*  < > 12.5 10.3* 10.2* 11.3*  HCT 42.3  < > 33.8* 34.1*  < > 39.3  33.5* 32.8* 36  MCV 83.6  < > 80.3 80.4  < > 79.2 77.9* 79.2  --   PLT 372  < > 226 261  < > 596* 514* 518* 447*  < > = values in this interval not displayed. Lipid Panel: No results found for this basename: CHOL, HDL, LDLCALC, TRIG, CHOLHDL, LDLDIRECT,  in the last 8760 hours  Past Procedures:  8/16 Dopper legs >> no DVT  8/17 Echo >> mod/severe LVH, EF 55 to 88%, grade 1 diastolic dysfx  5/02 CT chest >> borderline LAN with calcifications, patchy GGO with BTX and peribronchovascular nodularity, consolidation LLL  05/27/14 CXR IMPRESSION:  There has not been significant interval change since a study of  earlier today.  Assessment/Plan Acute respiratory failure with hypoxia O2 sat 89 % with O2 4lmp via [place]. No longer f/u Pulmonology recommended. Hospice Service  Maintained on Brovana bid, Budesonide bid, and Prednisone 36m. Neb prn. Will add Prednisone taper dose. CXR stat. DuoNeb q6hr    Anemia 05/27/14 Hgb 12.5 06/05/14 Hgb 11.3 07/22/14 CBC   BRONCHIECTASIS W/ACUTE EXACERBATION 8/17 CT chest >> borderline LAN with calcifications, patchy GGO with BTX and peribronchovascular nodularity, consolidation LLL  06/03/14 Mucinex 6016mbid prn  06/05/14 worsened cough, congestion, confusion-Dr. WeMelvyn Novastreated with IV Vancomycin and Cefepime in hospital, [facility] 10 mg by mouth prednisone daily.  Continue with nebulizers.  Cough and sputum production are improving.  Hospice referral       Bronchiectasis without acute exacerbation on her baseline of 10 mg by mouth prednisone daily.  Continue with nebulizers.  CXR. Prednisone taper dose. Supportive care    Chronic respiratory failure, unspecified whether with hypoxia or hypercapnia 06/06/14 Dr. WeMelvyn Novasulmonology: Palliative measures, continued pulmonary follow up is not needed.     Constipation Stable off Senokot S II bid  and continued Miralax bid    Depression Hold  narcotics to avoid oversedation  Continue zoloft and PRN xanax  06/13/14 increased Zoloft to 5070mbserve the patient     Depressive disorder, not elsewhere classified Better since 06/13/14 increased Zoloft to 9m37mily.    Diastolic dysfunction, left ventricle Clinically compensated cardiac aspect.     GERD Stable. Continue Pepcid 20mg69mly.     HTN (hypertension) Hx HTN  Hx TIA  Cont plvix, bisoprolol, sular     Hyponatremia 05/26/14 Na 126 06/05/14 Na 130 07/22/14 BMP   HYPOTHYROIDISM  tsh 0.12 on 112 so reduced to 88 mcg per day   12/26/12 - Tsh  8.6 on 88 so increased to 100 mcg per day 06/12/2013 >   4.3 08/07/13 so no change rx27/15 16.686 05/23/14 TSH 11.055 Increased Levothyroxine 125mcg4mitor TSH        Memory deficit Multiple factorial: narcotic prior to hospitalization, chronic hypoxemia, acute pelvic fx, and PNA. Recommended Hospice Service.   Pubic ramus fracture Managing pain, SNF for care needs.   Unspecified constipation Stable off Senokot S II bid  and continued Miralax bid      Family/ Staff Communication: observe the patient. Hospice Referral.   Goals of Care: SNF  Labs/tests ordered: CXR, CBC, BMP

## 2014-07-22 NOTE — Assessment & Plan Note (Signed)
Clinically compensated cardiac aspect.

## 2014-07-22 NOTE — Assessment & Plan Note (Signed)
05/27/14 Hgb 12.5 06/05/14 Hgb 11.3 07/22/14 CBC

## 2014-07-22 NOTE — Assessment & Plan Note (Signed)
8/17 CT chest >> borderline LAN with calcifications, patchy GGO with BTX and peribronchovascular nodularity, consolidation LLL  06/03/14 Mucinex 600mg  bid prn  06/05/14 worsened cough, congestion, confusion-Dr. Melvyn Novas: treated with IV Vancomycin and Cefepime in hospital, completed po evofloxacin total of 5 days at [facility] on her baseline of 10 mg by mouth prednisone daily.  Continue with nebulizers.  Cough and sputum production are improving.  Hospice referral

## 2014-07-22 NOTE — Assessment & Plan Note (Signed)
Hold narcotics to avoid oversedation  Continue zoloft and PRN xanax  06/13/14 increased Zoloft to 50mg  observe the patient

## 2014-07-22 NOTE — Assessment & Plan Note (Signed)
Better since 06/13/14 increased Zoloft to 50mg  daily.

## 2014-07-24 ENCOUNTER — Emergency Department (HOSPITAL_COMMUNITY)

## 2014-07-24 ENCOUNTER — Encounter (HOSPITAL_COMMUNITY): Payer: Self-pay | Admitting: Emergency Medicine

## 2014-07-24 ENCOUNTER — Observation Stay (HOSPITAL_COMMUNITY)

## 2014-07-24 ENCOUNTER — Inpatient Hospital Stay (HOSPITAL_COMMUNITY)
Admission: EM | Admit: 2014-07-24 | Discharge: 2014-07-27 | DRG: 689 | Disposition: A | Discharge status: Hospice | Attending: Internal Medicine | Admitting: Internal Medicine

## 2014-07-24 DIAGNOSIS — R1310 Dysphagia, unspecified: Secondary | ICD-10-CM | POA: Diagnosis present

## 2014-07-24 DIAGNOSIS — Z7982 Long term (current) use of aspirin: Secondary | ICD-10-CM | POA: Diagnosis not present

## 2014-07-24 DIAGNOSIS — F329 Major depressive disorder, single episode, unspecified: Secondary | ICD-10-CM | POA: Diagnosis present

## 2014-07-24 DIAGNOSIS — M810 Age-related osteoporosis without current pathological fracture: Secondary | ICD-10-CM | POA: Diagnosis present

## 2014-07-24 DIAGNOSIS — Z23 Encounter for immunization: Secondary | ICD-10-CM

## 2014-07-24 DIAGNOSIS — Z9049 Acquired absence of other specified parts of digestive tract: Secondary | ICD-10-CM | POA: Diagnosis present

## 2014-07-24 DIAGNOSIS — Z7952 Long term (current) use of systemic steroids: Secondary | ICD-10-CM | POA: Diagnosis not present

## 2014-07-24 DIAGNOSIS — E785 Hyperlipidemia, unspecified: Secondary | ICD-10-CM | POA: Diagnosis present

## 2014-07-24 DIAGNOSIS — Z885 Allergy status to narcotic agent status: Secondary | ICD-10-CM | POA: Diagnosis not present

## 2014-07-24 DIAGNOSIS — J479 Bronchiectasis, uncomplicated: Secondary | ICD-10-CM

## 2014-07-24 DIAGNOSIS — Z9981 Dependence on supplemental oxygen: Secondary | ICD-10-CM

## 2014-07-24 DIAGNOSIS — Y92129 Unspecified place in nursing home as the place of occurrence of the external cause: Secondary | ICD-10-CM

## 2014-07-24 DIAGNOSIS — S5292XA Unspecified fracture of left forearm, initial encounter for closed fracture: Secondary | ICD-10-CM | POA: Diagnosis present

## 2014-07-24 DIAGNOSIS — K219 Gastro-esophageal reflux disease without esophagitis: Secondary | ICD-10-CM | POA: Diagnosis present

## 2014-07-24 DIAGNOSIS — K589 Irritable bowel syndrome without diarrhea: Secondary | ICD-10-CM | POA: Diagnosis present

## 2014-07-24 DIAGNOSIS — Z882 Allergy status to sulfonamides status: Secondary | ICD-10-CM | POA: Diagnosis not present

## 2014-07-24 DIAGNOSIS — Z79899 Other long term (current) drug therapy: Secondary | ICD-10-CM | POA: Diagnosis not present

## 2014-07-24 DIAGNOSIS — Z8673 Personal history of transient ischemic attack (TIA), and cerebral infarction without residual deficits: Secondary | ICD-10-CM | POA: Diagnosis not present

## 2014-07-24 DIAGNOSIS — I10 Essential (primary) hypertension: Secondary | ICD-10-CM | POA: Diagnosis present

## 2014-07-24 DIAGNOSIS — Z91018 Allergy to other foods: Secondary | ICD-10-CM

## 2014-07-24 DIAGNOSIS — S0003XA Contusion of scalp, initial encounter: Secondary | ICD-10-CM | POA: Diagnosis present

## 2014-07-24 DIAGNOSIS — Z9889 Other specified postprocedural states: Secondary | ICD-10-CM | POA: Diagnosis not present

## 2014-07-24 DIAGNOSIS — Z888 Allergy status to other drugs, medicaments and biological substances status: Secondary | ICD-10-CM

## 2014-07-24 DIAGNOSIS — B9620 Unspecified Escherichia coli [E. coli] as the cause of diseases classified elsewhere: Secondary | ICD-10-CM | POA: Diagnosis present

## 2014-07-24 DIAGNOSIS — D649 Anemia, unspecified: Secondary | ICD-10-CM | POA: Diagnosis present

## 2014-07-24 DIAGNOSIS — Z881 Allergy status to other antibiotic agents status: Secondary | ICD-10-CM

## 2014-07-24 DIAGNOSIS — J849 Interstitial pulmonary disease, unspecified: Secondary | ICD-10-CM | POA: Diagnosis present

## 2014-07-24 DIAGNOSIS — K5900 Constipation, unspecified: Secondary | ICD-10-CM | POA: Diagnosis present

## 2014-07-24 DIAGNOSIS — N390 Urinary tract infection, site not specified: Secondary | ICD-10-CM | POA: Diagnosis not present

## 2014-07-24 DIAGNOSIS — J471 Bronchiectasis with (acute) exacerbation: Secondary | ICD-10-CM | POA: Diagnosis present

## 2014-07-24 DIAGNOSIS — Z91041 Radiographic dye allergy status: Secondary | ICD-10-CM | POA: Diagnosis not present

## 2014-07-24 DIAGNOSIS — Z515 Encounter for palliative care: Secondary | ICD-10-CM | POA: Diagnosis not present

## 2014-07-24 DIAGNOSIS — J9611 Chronic respiratory failure with hypoxia: Secondary | ICD-10-CM

## 2014-07-24 DIAGNOSIS — W19XXXA Unspecified fall, initial encounter: Secondary | ICD-10-CM | POA: Diagnosis present

## 2014-07-24 DIAGNOSIS — F0390 Unspecified dementia without behavioral disturbance: Secondary | ICD-10-CM | POA: Diagnosis present

## 2014-07-24 DIAGNOSIS — Z823 Family history of stroke: Secondary | ICD-10-CM | POA: Diagnosis not present

## 2014-07-24 DIAGNOSIS — G9340 Encephalopathy, unspecified: Secondary | ICD-10-CM

## 2014-07-24 DIAGNOSIS — Z66 Do not resuscitate: Secondary | ICD-10-CM | POA: Diagnosis present

## 2014-07-24 DIAGNOSIS — S52592A Other fractures of lower end of left radius, initial encounter for closed fracture: Secondary | ICD-10-CM | POA: Diagnosis present

## 2014-07-24 DIAGNOSIS — E039 Hypothyroidism, unspecified: Secondary | ICD-10-CM | POA: Diagnosis present

## 2014-07-24 DIAGNOSIS — K449 Diaphragmatic hernia without obstruction or gangrene: Secondary | ICD-10-CM | POA: Diagnosis present

## 2014-07-24 DIAGNOSIS — Z7902 Long term (current) use of antithrombotics/antiplatelets: Secondary | ICD-10-CM | POA: Diagnosis not present

## 2014-07-24 DIAGNOSIS — Z8249 Family history of ischemic heart disease and other diseases of the circulatory system: Secondary | ICD-10-CM | POA: Diagnosis not present

## 2014-07-24 DIAGNOSIS — S52502A Unspecified fracture of the lower end of left radius, initial encounter for closed fracture: Secondary | ICD-10-CM

## 2014-07-24 DIAGNOSIS — S42401A Unspecified fracture of lower end of right humerus, initial encounter for closed fracture: Secondary | ICD-10-CM

## 2014-07-24 DIAGNOSIS — J45909 Unspecified asthma, uncomplicated: Secondary | ICD-10-CM | POA: Diagnosis present

## 2014-07-24 DIAGNOSIS — J9610 Chronic respiratory failure, unspecified whether with hypoxia or hypercapnia: Secondary | ICD-10-CM | POA: Diagnosis present

## 2014-07-24 DIAGNOSIS — J9621 Acute and chronic respiratory failure with hypoxia: Secondary | ICD-10-CM | POA: Diagnosis not present

## 2014-07-24 DIAGNOSIS — K5790 Diverticulosis of intestine, part unspecified, without perforation or abscess without bleeding: Secondary | ICD-10-CM | POA: Diagnosis present

## 2014-07-24 DIAGNOSIS — J9601 Acute respiratory failure with hypoxia: Secondary | ICD-10-CM

## 2014-07-24 DIAGNOSIS — R0902 Hypoxemia: Secondary | ICD-10-CM

## 2014-07-24 LAB — BASIC METABOLIC PANEL
Anion gap: 9 (ref 5–15)
BUN: 19 mg/dL (ref 6–23)
CO2: 31 meq/L (ref 19–32)
Calcium: 8.8 mg/dL (ref 8.4–10.5)
Chloride: 98 mEq/L (ref 96–112)
Creatinine, Ser: 0.77 mg/dL (ref 0.50–1.10)
GFR calc Af Amer: >90 mL/min (ref >90)
GFR calc non Af Amer: 78 mL/min — ABNORMAL LOW (ref >90)
Glucose, Bld: 103 mg/dL — ABNORMAL HIGH (ref 70–99)
Potassium: 4.6 mEq/L (ref 3.7–5.3)
SODIUM: 138 meq/L (ref 137–147)

## 2014-07-24 LAB — CBC WITH DIFFERENTIAL/PLATELET
BASOS PCT: 0 % (ref 0–1)
Basophils Absolute: 0 10*3/uL (ref 0.0–0.1)
Eosinophils Absolute: 0.2 10*3/uL (ref 0.0–0.7)
Eosinophils Relative: 1 % (ref 0–5)
HEMATOCRIT: 32.9 % — AB (ref 36.0–46.0)
HEMOGLOBIN: 10.3 g/dL — AB (ref 12.0–15.0)
LYMPHS PCT: 13 % (ref 12–46)
Lymphs Abs: 2 10*3/uL (ref 0.7–4.0)
MCH: 24.8 pg — AB (ref 26.0–34.0)
MCHC: 31.3 g/dL (ref 30.0–36.0)
MCV: 79.3 fL (ref 78.0–100.0)
MONO ABS: 1.7 10*3/uL — AB (ref 0.1–1.0)
Monocytes Relative: 11 % (ref 3–12)
NEUTROS ABS: 11.5 10*3/uL — AB (ref 1.7–7.7)
NEUTROS PCT: 75 % (ref 43–77)
Platelets: 363 10*3/uL (ref 150–400)
RBC: 4.15 MIL/uL (ref 3.87–5.11)
RDW: 16.3 % — ABNORMAL HIGH (ref 11.5–15.5)
WBC: 15.4 10*3/uL — ABNORMAL HIGH (ref 4.0–10.5)

## 2014-07-24 LAB — MRSA PCR SCREENING: MRSA BY PCR: NEGATIVE

## 2014-07-24 LAB — URINALYSIS, ROUTINE W REFLEX MICROSCOPIC
Bilirubin Urine: NEGATIVE
Glucose, UA: NEGATIVE mg/dL
HGB URINE DIPSTICK: NEGATIVE
Ketones, ur: NEGATIVE mg/dL
Nitrite: POSITIVE — AB
Protein, ur: NEGATIVE mg/dL
Specific Gravity, Urine: 1.01 (ref 1.005–1.030)
UROBILINOGEN UA: 0.2 mg/dL (ref 0.0–1.0)
pH: 6.5 (ref 5.0–8.0)

## 2014-07-24 LAB — URINE MICROSCOPIC-ADD ON

## 2014-07-24 LAB — TSH: TSH: 4.81 u[IU]/mL — ABNORMAL HIGH (ref 0.350–4.500)

## 2014-07-24 MED ORDER — MORPHINE SULFATE 2 MG/ML IJ SOLN: 1.0000 mg | INTRAMUSCULAR | Status: DC | PRN

## 2014-07-24 MED ORDER — ACETAMINOPHEN 325 MG PO TABS
650.0000 mg | ORAL_TABLET | Freq: Four times a day (QID) | ORAL | Status: DC | PRN
Start: 2014-07-24 — End: 2014-07-27
  Filled 2014-07-24: qty 2

## 2014-07-24 MED ORDER — BUDESONIDE 0.5 MG/2ML IN SUSP
0.5000 mg | Freq: Two times a day (BID) | RESPIRATORY_TRACT | Status: DC
  Administered 2014-07-24 – 2014-07-27 (×5): 0.5 mg via RESPIRATORY_TRACT
  Filled 2014-07-24 (×10): qty 2

## 2014-07-24 MED ORDER — FAMOTIDINE 20 MG PO TABS
20.0000 mg | ORAL_TABLET | Freq: Every day | ORAL | Status: DC
  Filled 2014-07-24 (×2): qty 1

## 2014-07-24 MED ORDER — ALBUTEROL SULFATE (2.5 MG/3ML) 0.083% IN NEBU: 2.5000 mg | INHALATION_SOLUTION | RESPIRATORY_TRACT | Status: DC | PRN

## 2014-07-24 MED ORDER — TRAMADOL HCL 50 MG PO TABS: 50.0000 mg | ORAL_TABLET | Freq: Two times a day (BID) | ORAL | Status: DC | PRN

## 2014-07-24 MED ORDER — ONDANSETRON HCL 4 MG PO TABS: 4.0000 mg | ORAL_TABLET | Freq: Four times a day (QID) | ORAL | Status: DC | PRN

## 2014-07-24 MED ORDER — SACCHAROMYCES BOULARDII 250 MG PO CAPS
250.0000 mg | ORAL_CAPSULE | Freq: Two times a day (BID) | ORAL | Status: DC
  Administered 2014-07-25: 250 mg via ORAL
  Filled 2014-07-24 (×6): qty 1

## 2014-07-24 MED ORDER — PROBIOTIC DAILY PO CAPS: 1.0000 | ORAL_CAPSULE | Freq: Every morning | ORAL | Status: DC

## 2014-07-24 MED ORDER — FUROSEMIDE 10 MG/ML IJ SOLN
20.0000 mg | Freq: Once | INTRAMUSCULAR | Status: AC
  Administered 2014-07-24: 20 mg via INTRAVENOUS

## 2014-07-24 MED ORDER — LEVOTHYROXINE SODIUM 150 MCG PO TABS
150.0000 ug | ORAL_TABLET | Freq: Every day | ORAL | Status: DC
  Administered 2014-07-26: 150 ug via ORAL
  Filled 2014-07-24 (×3): qty 1

## 2014-07-24 MED ORDER — FLUTICASONE PROPIONATE 50 MCG/ACT NA SUSP
2.0000 | Freq: Every morning | NASAL | Status: DC
  Administered 2014-07-24 – 2014-07-26 (×3): 2 via NASAL
  Filled 2014-07-24: qty 16

## 2014-07-24 MED ORDER — ONDANSETRON HCL 4 MG/2ML IJ SOLN: 4.0000 mg | Freq: Four times a day (QID) | INTRAMUSCULAR | Status: DC | PRN

## 2014-07-24 MED ORDER — RISAQUAD PO CAPS
1.0000 | ORAL_CAPSULE | Freq: Every day | ORAL | Status: DC
  Filled 2014-07-24 (×3): qty 1

## 2014-07-24 MED ORDER — ACETAMINOPHEN 650 MG RE SUPP: 650.0000 mg | Freq: Four times a day (QID) | RECTAL | Status: DC | PRN

## 2014-07-24 MED ORDER — BOOST / RESOURCE BREEZE PO LIQD
1.0000 | Freq: Three times a day (TID) | ORAL | Status: DC
  Administered 2014-07-25: 1 via ORAL

## 2014-07-24 MED ORDER — CEFTRIAXONE SODIUM 1 G IJ SOLR
1.0000 g | INTRAMUSCULAR | Status: DC
  Administered 2014-07-25: 1 g via INTRAVENOUS
  Filled 2014-07-24 (×2): qty 10

## 2014-07-24 MED ORDER — PREDNISONE 20 MG PO TABS
40.0000 mg | ORAL_TABLET | Freq: Every day | ORAL | Status: DC
  Administered 2014-07-24: 40 mg via ORAL
  Filled 2014-07-24 (×3): qty 2

## 2014-07-24 MED ORDER — FUROSEMIDE 10 MG/ML IJ SOLN
INTRAMUSCULAR | Status: AC
  Filled 2014-07-24: qty 4

## 2014-07-24 MED ORDER — ALPRAZOLAM 0.25 MG PO TABS: 0.2500 mg | ORAL_TABLET | Freq: Two times a day (BID) | ORAL | Status: DC | PRN

## 2014-07-24 MED ORDER — SENNOSIDES-DOCUSATE SODIUM 8.6-50 MG PO TABS
2.0000 | ORAL_TABLET | Freq: Two times a day (BID) | ORAL | Status: DC
  Filled 2014-07-24 (×4): qty 2

## 2014-07-24 MED ORDER — NISOLDIPINE ER 8.5 MG PO TB24
8.5000 mg | ORAL_TABLET | Freq: Every morning | ORAL | Status: DC
  Administered 2014-07-24: 8.5 mg via ORAL
  Filled 2014-07-24 (×3): qty 1

## 2014-07-24 MED ORDER — CLOPIDOGREL BISULFATE 75 MG PO TABS
75.0000 mg | ORAL_TABLET | Freq: Every morning | ORAL | Status: DC
  Filled 2014-07-24 (×3): qty 1

## 2014-07-24 MED ORDER — GUAIFENESIN ER 600 MG PO TB12
1200.0000 mg | ORAL_TABLET | Freq: Two times a day (BID) | ORAL | Status: DC
  Administered 2014-07-24 (×2): 1200 mg via ORAL
  Filled 2014-07-24 (×6): qty 2

## 2014-07-24 MED ORDER — CALCIUM CARBONATE-VITAMIN D 500-200 MG-UNIT PO TABS
1.0000 | ORAL_TABLET | Freq: Two times a day (BID) | ORAL | Status: DC
  Filled 2014-07-24 (×8): qty 1

## 2014-07-24 MED ORDER — DEXTROSE 5 % IV SOLN
1.0000 g | Freq: Once | INTRAVENOUS | Status: AC
  Administered 2014-07-24: 1 g via INTRAVENOUS
  Filled 2014-07-24: qty 10

## 2014-07-24 MED ORDER — BISOPROLOL FUMARATE 5 MG PO TABS
2.5000 mg | ORAL_TABLET | Freq: Every morning | ORAL | Status: DC
  Administered 2014-07-24: 2.5 mg via ORAL
  Filled 2014-07-24 (×3): qty 0.5

## 2014-07-24 MED ORDER — ARFORMOTEROL TARTRATE 15 MCG/2ML IN NEBU
15.0000 ug | INHALATION_SOLUTION | Freq: Two times a day (BID) | RESPIRATORY_TRACT | Status: DC
  Administered 2014-07-24 – 2014-07-27 (×6): 15 ug via RESPIRATORY_TRACT
  Filled 2014-07-24 (×8): qty 2

## 2014-07-24 MED ORDER — HEPARIN SODIUM (PORCINE) 5000 UNIT/ML IJ SOLN
5000.0000 [IU] | Freq: Three times a day (TID) | INTRAMUSCULAR | Status: DC
  Administered 2014-07-24 – 2014-07-26 (×5): 5000 [IU] via SUBCUTANEOUS
  Filled 2014-07-24 (×6): qty 1

## 2014-07-24 MED ORDER — POLYETHYLENE GLYCOL 3350 17 G PO PACK
17.0000 g | PACK | Freq: Two times a day (BID) | ORAL | Status: DC
  Administered 2014-07-25 – 2014-07-27 (×2): 17 g via ORAL
  Filled 2014-07-24 (×8): qty 1

## 2014-07-24 MED ORDER — MORPHINE SULFATE 2 MG/ML IJ SOLN
2.0000 mg | Freq: Once | INTRAMUSCULAR | Status: AC
  Administered 2014-07-24: 2 mg via INTRAVENOUS
  Filled 2014-07-24: qty 1

## 2014-07-24 MED ORDER — TETANUS-DIPHTH-ACELL PERTUSSIS 5-2.5-18.5 LF-MCG/0.5 IM SUSP
0.5000 mL | Freq: Once | INTRAMUSCULAR | Status: AC
  Administered 2014-07-24: 0.5 mL via INTRAMUSCULAR
  Filled 2014-07-24: qty 0.5

## 2014-07-24 MED ORDER — IPRATROPIUM-ALBUTEROL 0.5-2.5 (3) MG/3ML IN SOLN
3.0000 mL | Freq: Four times a day (QID) | RESPIRATORY_TRACT | Status: DC
  Administered 2014-07-24 – 2014-07-25 (×3): 3 mL via RESPIRATORY_TRACT
  Filled 2014-07-24 (×3): qty 3

## 2014-07-24 MED ORDER — SODIUM CHLORIDE 0.9 % IV SOLN: INTRAVENOUS | Status: DC

## 2014-07-24 MED ORDER — HYDROCODONE-ACETAMINOPHEN 5-325 MG PO TABS: 1.0000 | ORAL_TABLET | ORAL | Status: DC | PRN

## 2014-07-24 MED ORDER — ASPIRIN EC 81 MG PO TBEC
81.0000 mg | DELAYED_RELEASE_TABLET | Freq: Every day | ORAL | Status: DC
  Filled 2014-07-24 (×3): qty 1

## 2014-07-24 MED ORDER — LATANOPROST 0.005 % OP SOLN
1.0000 [drp] | Freq: Every day | OPHTHALMIC | Status: DC
  Administered 2014-07-24: 1 [drp] via OPHTHALMIC
  Filled 2014-07-24: qty 2.5

## 2014-07-24 MED ORDER — SODIUM CHLORIDE 0.9 % IJ SOLN
3.0000 mL | Freq: Two times a day (BID) | INTRAMUSCULAR | Status: DC
  Administered 2014-07-26: 3 mL via INTRAVENOUS

## 2014-07-24 NOTE — Progress Notes (Signed)
Admitted patient from EMS originally came from [place] from that facility and with admitting dx of UTI. Patient alert to self,verbally responsive on low modulated weak voice,chronic oxygen dependent at 6 lpm/[place],lung sound on both lung fields are crackles and rhonchi.Patient has stage 1 pressure ulcer on anterior side of her sacral area.Secondary to fall,patient has multiple skin issue like,deep skin tear at the right forehead just above the level of eyebrow,large black/bluish bruised on her entire left eye area,a hematoma  and knots on her left side forehead,skin tear on the left elbow ,skin tear at the left side ankle,all measured and documented at the flow sheet.Patient also has left wrist fracture with orthopedic brace for support and stabilization.No orders yet as of this time.Md made aware that patient arrived on the floor.

## 2014-07-24 NOTE — H&P (Signed)
[facility] History and Physical  [name] OTL:[id number] DOB: 02-26-1933 DOA: 07/24/2014  Referring physician: EDP PCP: Christinia Gully, MD   Chief Complaint: Fall  HPI: [name] is a 80 y.o. female with history of bronchiectasis, chronic hypoxic respiratory disorder on 4 L of oxygen, TIA and advanced dementia. Patient lives in a nursing home and she was sent to cause of her witnessed fall. Patient is demented and unable to provide history, history obtained from records. Patient have left forehead hematoma and left forearm pain. In the ED CT scan of the head/neck showed left-sided scalp hematoma without intracranial findings, left upper extremity x-ray showed closed comminuted distal radial fracture. Urinalysis consistent with UTI. Patient hospital for further evaluation.  Review of Systems:  Unable to obtain ROS because of dementia  Past Medical History  Diagnosis Date  . Bronchiectasis with acute exacerbation 05/16/14    acute respiratory failure   . Other and unspecified hyperlipidemia   . TIA (transient ischemic attack)   . Osteoporosis, unspecified   . Asthma   . Esophageal reflux   . IBS (irritable bowel syndrome)   . Diverticulosis   . Hemorrhoids   . Hypothyroidism   . Schatzki's ring   . Hiatal hernia   . Tubular adenoma of colon 12/2008  . Chronic respiratory failure 05/16/14  . HCAP (healthcare-associated pneumonia) 05/16/14  . Sinusitis   . GERD (gastroesophageal reflux disease)   . HTN (hypertension)   . Encephalopathy 05/16/14    noted during hospitalization  . Depressive disorder, not elsewhere classified   . Diastolic dysfunction, left ventricle 05/16/14  . Memory loss   . Pubic ramus fracture   . Hiatal hernia   . Tubular adenoma of colon   . Hypoxia   . Anemia, unspecified   . Constipation    Past Surgical History  Procedure Laterality Date  . Cholecystectomy  1995  . Nasal sinus surgery    . Tonsillectomy    . Foot surgery    .  Small intestine surgery  05/2008   Social History:   reports that she has never smoked. She has never used smokeless tobacco. She reports that she does not drink alcohol or use illicit drugs.  Allergies  Allergen Reactions  . Chocolate Cough  . Codeine     Unknown reaction  . Erythromycin Ethylsuccinate     REACTION: unspecified  . Iodine   . Iohexol      Desc: pt states allergy many years ago--unable to remember type of allergy--pre med in future slg 06/13/08   . Sulfamethoxazole     REACTION: unspecified  . Septra [Bactrim] Itching and Rash    Family History  Problem Relation Age of Onset  . Hypertension Sister   . Heart disease Mother   . Heart disease Father   . Stroke Father   . Multiple myeloma Maternal Aunt   . Colon cancer Mother      Prior to Admission medications   Medication Sig Start Date End Date Taking? Authorizing Provider  albuterol (PROVENTIL) (2.5 MG/3ML) 0.083% nebulizer solution Take 2.5 mg by nebulization every 3 (three) hours as needed for wheezing or shortness of breath.   Yes Historical Provider, MD  ALPRAZolam (XANAX) 0.25 MG tablet Take 1 tablet (0.25 mg total) by mouth 2 (two) times daily as needed for anxiety. 05/30/14  Yes Dawood Elgergawy, MD  arformoterol (BROVANA) 15 MCG/2ML NEBU Take 15 mcg by nebulization every 12 (twelve) hours.   Yes Historical Provider, MD  aspirin EC 81 MG tablet Take 81 mg by mouth daily.   Yes Historical Provider, MD  bisoprolol (ZEBETA) 5 MG tablet Take 2.5 mg by mouth every morning.    Yes Historical Provider, MD  budesonide (PULMICORT) 0.5 MG/2ML nebulizer solution Take 0.5 mg by nebulization 2 (two) times daily.   Yes Historical Provider, MD  calcium-vitamin D (OSCAL WITH D) 500-200 MG-UNIT per tablet Take 1 tablet by mouth 2 (two) times daily.     Yes Historical Provider, MD  cetirizine (ZYRTEC) 10 MG tablet Take 10 mg by mouth at bedtime.   Yes Historical Provider, MD  clopidogrel (PLAVIX) 75 MG tablet Take 75 mg by  mouth every morning.    Yes Historical Provider, MD  famotidine (PEPCID) 20 MG tablet Take 20 mg by mouth at bedtime.    Yes Historical Provider, MD  feeding supplement, RESOURCE BREEZE, (RESOURCE BREEZE) LIQD Take 120 mLs by mouth 3 (three) times daily between meals.    Yes Historical Provider, MD  fluticasone (FLONASE) 50 MCG/ACT nasal spray Place 2 sprays into both nostrils every morning.    Yes Historical Provider, MD  guaiFENesin (MUCINEX) 600 MG 12 hr tablet Take 600 mg by mouth 2 (two) times daily.   Yes Historical Provider, MD  ipratropium-albuterol (DUONEB) 0.5-2.5 (3) MG/3ML SOLN Take 3 mLs by nebulization every 6 (six) hours.   Yes Historical Provider, MD  latanoprost (XALATAN) 0.005 % ophthalmic solution Place 1 drop into both eyes at bedtime.  12/14/10  Yes Historical Provider, MD  levothyroxine (SYNTHROID, LEVOTHROID) 150 MCG tablet Take 150 mcg by mouth daily before breakfast.   Yes Historical Provider, MD  moxifloxacin (AVELOX) 400 MG tablet Take 400 mg by mouth daily at 8 pm.   Yes Historical Provider, MD  nisoldipine (SULAR) 8.5 MG 24 hr tablet Take 8.5 mg by mouth every morning.    Yes Historical Provider, MD  polyethylene glycol (MIRALAX / GLYCOLAX) packet Take 17 g by mouth 2 (two) times daily.   Yes Historical Provider, MD  predniSONE (DELTASONE) 10 MG tablet Take 10 mg by mouth daily with breakfast.   Yes Historical Provider, MD  predniSONE (DELTASONE) 20 MG tablet Take 40 mg by mouth daily with breakfast.   Yes Historical Provider, MD  Probiotic Product (PROBIOTIC DAILY) CAPS Take 1 capsule by mouth every morning.   Yes Historical Provider, MD  saccharomyces boulardii (FLORASTOR) 250 MG capsule Take 250 mg by mouth 2 (two) times daily.   Yes Historical Provider, MD  senna-docusate (SENOKOT-S) 8.6-50 MG per tablet Take 2 tablets by mouth 2 (two) times daily.   Yes Historical Provider, MD  traMADol (ULTRAM) 50 MG tablet Take 50 mg by mouth every 12 (twelve) hours as needed for  moderate pain.   Yes Historical Provider, MD   Physical Exam: Filed Vitals:   07/24/14 0840  BP: 198/85  Pulse: 104  Temp: 98.8 F (37.1 C)  Resp: 18   Constitutional: Oriented to person, place, and time. Well-developed and well-nourished. Cooperative.  Head: Normocephalic and atraumatic.  Nose: Nose normal.  Mouth/Throat: Uvula is midline, oropharynx is clear and moist and mucous membranes are normal.  Eyes: Conjunctivae and EOM are normal. Pupils are equal, round, and reactive to light.  Neck: Trachea normal and normal range of motion. Neck supple.  Cardiovascular: Normal rate, regular rhythm, S1 normal, S2 normal, normal heart sounds and intact distal pulses.   Pulmonary/Chest: Effort normal and breath sounds normal.  Abdominal: Soft. Bowel sounds are normal. There is  no hepatosplenomegaly. There is no tenderness.  Musculoskeletal: Normal range of motion.  Neurological: Alert and oriented to person, place, and time. Has normal strength. No cranial nerve deficit or sensory deficit.  Skin: Skin is warm, dry and intact.  Psychiatric: Has a normal mood and affect. Speech is normal and behavior is normal.   Labs on Admission:  Basic Metabolic Panel:  Recent Labs Lab 07/24/14 0506  NA 138  K 4.6  CL 98  CO2 31  GLUCOSE 103*  BUN 19  CREATININE 0.77  CALCIUM 8.8   Liver Function Tests: No results found for this basename: AST, ALT, ALKPHOS, BILITOT, PROT, ALBUMIN,  in the last 168 hours No results found for this basename: LIPASE, AMYLASE,  in the last 168 hours No results found for this basename: AMMONIA,  in the last 168 hours CBC:  Recent Labs Lab 07/24/14 0506  WBC 15.4*  NEUTROABS 11.5*  HGB 10.3*  HCT 32.9*  MCV 79.3  PLT 363   Cardiac Enzymes: No results found for this basename: CKTOTAL, CKMB, CKMBINDEX, TROPONINI,  in the last 168 hours  BNP (last 3 results)  Recent Labs  05/11/14 1122 05/11/14 1430 05/27/14 1414  PROBNP 442.9 1377.0* 528.2*    CBG: No results found for this basename: GLUCAP,  in the last 168 hours  Radiological Exams on Admission: Dg Chest 2 View  07/24/2014   CLINICAL DATA:  Fall  EXAM: CHEST  2 VIEW  COMPARISON:  05/27/2014  FINDINGS: Heart size and mediastinal contours within normal range. Diffuse reticulonodular opacities. Lower lobe bronchiectasis. Similar left lung base opacity. No overt pleural effusion or pneumothorax. Diffuse osteopenia. No definite acute osseous finding.  IMPRESSION: Similar reticulonodular interstitial prominence and lower lobe bronchiectasis.  More confluent left greater than right lung base opacities may reflect atelectasis or infiltrate.   Electronically Signed   By: Carlos Levering M.D.   On: 07/24/2014 04:08   Dg Elbow Complete Left  07/24/2014   CLINICAL DATA:  Fall  EXAM: LEFT ELBOW - COMPLETE 3+ VIEW  COMPARISON:  None.  FINDINGS: Four views of the left elbow show no displaced fracture or dislocation. No aggressive osseous lesion. No overt joint effusion.  IMPRESSION: No acute or aggressive osseous finding of the left elbow.  If clinical concern for an acute fracture persists, recommend a repeat radiograph in 7-10 days to evaluate for interval change or callus formation.   Electronically Signed   By: Carlos Levering M.D.   On: 07/24/2014 04:05   Dg Wrist Complete Left  07/24/2014   CLINICAL DATA:  Fall, left wrist bruising and deformity.  EXAM: LEFT WRIST - COMPLETE 3+ VIEW  COMPARISON:  Contemporaneous elbow radiographs  FINDINGS: There is an impacted and comminuted fracture of the distal left radius with mild dorsal displacement and angulation. There is intra-articular extension into the radiocarpal joint and into the distal radial ulnar joint as well. Slight ulnar positive variance. Diffuse osteopenia. Atherosclerotic vascular calcifications. Soft tissue swelling.  IMPRESSION: Comminuted impaction fracture of the distal left radius with dorsal angulation and intra-articular  extension.  Ulnar positive variance.   Electronically Signed   By: Carlos Levering M.D.   On: 07/24/2014 04:11   Ct Head Wo Contrast  07/24/2014   CLINICAL DATA:  Left forehead hematoma status post fall.  EXAM: CT HEAD WITHOUT CONTRAST  CT CERVICAL SPINE WITHOUT CONTRAST  TECHNIQUE: Multidetector CT imaging of the head and cervical spine was performed following the standard protocol without intravenous contrast. Multiplanar CT  image reconstructions of the cervical spine were also generated.  COMPARISON:  04/29/2012  FINDINGS: CT HEAD FINDINGS  Moderate subcortical and periventricular white matter hypodensities, a nonspecific finding often seen in the setting of chronic microangiopathic change. Bilateral basal ganglia rounded hypodensities, favored to reflect remote lacunar infarctions. No definite CT evidence for acute cortical based (large artery) infarction. No intraparenchymal hemorrhage, mass, mass effect, or abnormal extra-axial fluid collection. Mild cerebral volume loss. Ventricular prominence is mildly disproportionate to volume loss elsewhere. Atherosclerotic vascular calcifications. Left frontal scalp hematoma. Evidence of chronic sinusitis with wall thickening of the maxillary sinuses. Opacified sphenoid chambers and partially opacified ethmoid air cells and frontal sinus. Right maxillary sinus mucosal thickening. Partially opacified mastoid air cells. No displaced calvarial fracture.  CT CERVICAL SPINE FINDINGS  Lung apices show ground-glass opacities and multifocal impacted bronchioles. Atherosclerotic vascular calcifications. Maintained craniocervical relationship. Similar impaction of the dens and clivus with osseous remodeling. Multilevel degenerative changes, most pronounced at C4-5. Multifocal lucencies are similar to prior and may be degenerative or secondary to osteopenia. Mild anterolisthesis of C6 on C7. No displaced acute fracture or dislocation.  IMPRESSION: Left frontal scalp hematoma.   No underlying calvarial fracture.  Ventriculomegaly, similar to prior. May reflect central cerebral volume loss or normal pressure hydrocephalus.  White matter changes are similar to prior, a nonspecific finding most often seen in the setting of chronic microangiopathic change.  Extensive paranasal sinus disease as above. May reflect acute on chronic sinusitis in the appropriate clinical setting.  Bilateral mastoid effusions.  Multilevel degenerative changes of the cervical spine. No acute osseous finding.  Lung apices show ground-glass opacities and impacted bronchioles,, similar to prior.   Electronically Signed   By: Carlos Levering M.D.   On: 07/24/2014 04:43   Ct Cervical Spine Wo Contrast  07/24/2014   CLINICAL DATA:  Left forehead hematoma status post fall.  EXAM: CT HEAD WITHOUT CONTRAST  CT CERVICAL SPINE WITHOUT CONTRAST  TECHNIQUE: Multidetector CT imaging of the head and cervical spine was performed following the standard protocol without intravenous contrast. Multiplanar CT image reconstructions of the cervical spine were also generated.  COMPARISON:  04/29/2012  FINDINGS: CT HEAD FINDINGS  Moderate subcortical and periventricular white matter hypodensities, a nonspecific finding often seen in the setting of chronic microangiopathic change. Bilateral basal ganglia rounded hypodensities, favored to reflect remote lacunar infarctions. No definite CT evidence for acute cortical based (large artery) infarction. No intraparenchymal hemorrhage, mass, mass effect, or abnormal extra-axial fluid collection. Mild cerebral volume loss. Ventricular prominence is mildly disproportionate to volume loss elsewhere. Atherosclerotic vascular calcifications. Left frontal scalp hematoma. Evidence of chronic sinusitis with wall thickening of the maxillary sinuses. Opacified sphenoid chambers and partially opacified ethmoid air cells and frontal sinus. Right maxillary sinus mucosal thickening. Partially opacified  mastoid air cells. No displaced calvarial fracture.  CT CERVICAL SPINE FINDINGS  Lung apices show ground-glass opacities and multifocal impacted bronchioles. Atherosclerotic vascular calcifications. Maintained craniocervical relationship. Similar impaction of the dens and clivus with osseous remodeling. Multilevel degenerative changes, most pronounced at C4-5. Multifocal lucencies are similar to prior and may be degenerative or secondary to osteopenia. Mild anterolisthesis of C6 on C7. No displaced acute fracture or dislocation.  IMPRESSION: Left frontal scalp hematoma.  No underlying calvarial fracture.  Ventriculomegaly, similar to prior. May reflect central cerebral volume loss or normal pressure hydrocephalus.  White matter changes are similar to prior, a nonspecific finding most often seen in the setting of chronic microangiopathic change.  Extensive paranasal sinus  disease as above. May reflect acute on chronic sinusitis in the appropriate clinical setting.  Bilateral mastoid effusions.  Multilevel degenerative changes of the cervical spine. No acute osseous finding.  Lung apices show ground-glass opacities and impacted bronchioles,, similar to prior.   Electronically Signed   By: Carlos Levering M.D.   On: 07/24/2014 04:43   Dg Chest Portable 1 View  07/24/2014   CLINICAL DATA:  Shortness of breath.  Hypoxia.  EXAM: PORTABLE CHEST - 1 VIEW  COMPARISON:  Chest x-ray 07/24/2014,05/02/2014,08/07/2013,05/01/2012. CT chest 08/13/2013.  FINDINGS: Mediastinum hilar structures normal. Heart size normal. Pulmonary vascularity normal. Diffuse unchanged chronic reticular nodular interstitial infiltrates with bibasilar bronchiectasis again noted. No significant pleural effusion. No pneumothorax. Old right proximal humeral fracture.  IMPRESSION: Chronic bilateral reticular nodular interstitial infiltrates with bibasilar bronchiectasis and atelectasis. 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 No  change from multiple prior exams.   Electronically Signed   By: Marcello Moores  Register   On: 07/24/2014 08:14   Radiologic studies reviewed independently x-ray did not show changed from previously.  EKG: Independently reviewed, NSR rate is 98 bpm.   Assessment/Plan Principal Problem:   UTI (lower urinary tract infection) Active Problems:   Bronchiectasis without acute exacerbation   Chronic respiratory failure   Hypothyroidism   Fall   Dementia   Closed left radial fracture    UTI Presented with leukocytosis of 15.4, and urinalysis consistent with UTI. Patient is unable to word her complaints. Start on Rocephin, check urine culture.  Left radial fracture Reported that patient fell, x-rays showed left distal radial fracture. Dr. Lenon Curt of surgery was called and recommended patient to be referred to him as outpatient. Control pain with narcotic pain medications.  Chronic respiratory failure with hypoxia Chronic interstitial lung disease, patient is on 4 L of oxygen at home. Continue oxygen, mucolytics and ambulate as tolerated.  Fall Unclear what happened as patient unable to provide history. Be secondary to weakness from UTI or hypoxia, 12-lead EKG showed normal sinus rhythm. Head CT's neck showed left-sided scalp hematoma, left radial fracture but no intracranial findings.  Dementia Advanced dementia, patient does not have.  Hypothyroidism Continue Synthroid (increased last month) and check TSH.  Code Status: DO NOT RESUSCITATE, has DO NOT RESUSCITATE out of facility form.  Family Communication:  Disposition Plan: Telemetry   Time spent: 70 minutes  [facility] Pager [phone]

## 2014-07-24 NOTE — Progress Notes (Signed)
Inpatient RN visit- [place] Room 11 -[facility] RN Visit-Karen Alford [place] RN  Related admission to [facility] diagnosis of Chronic Respiratory Failure.  Pt is DNR code.  Pt newly admitted to [facility] services on 10/27, is a current resident of [facility].  Pt seen at bedside lying on left side w/eyes open. Left eye with orbital bruising and swelling. Left wrist with stabilizing splint in place. Pt oriented to self only, she denied pain when asked by Probation officer. Patient does have PRN pain medication available.  Wet sounding adible upper airway congestion, with very weak cough effort noted.Respiratory rate 24, w/use of accessory muscles. Per chart review pt had just recvd her scheduled breathing treatment. Patient condition discussed with Staff RN Gwenlyn Perking. Pt is currently NPO, Gwenlyn Perking to request swallow evaluation prior to diet being ordered.Mouth care provided, lips cracked and dry.  Pt resting at end of visit. [facility] will continue to follow daily.  Patient's home medication list, transfer summary, OOF DNR and MOST form in place on shadow chart.  Please call [facility] @ [phone]-with any hospice needs.   Thank you. Tracey Harries, RN  [facility]  Hospice Liaison  [phone])

## 2014-07-24 NOTE — ED Notes (Signed)
Pt arrives via EMS from [facility] with c/o unwitnessed fall. Obvious hematoma to L forehead, small lac above L elbow. L wrist pain. Skin tear to left forearm. No neck/back pain. Alert to self and intermittently location and confused to time, no plavix for about a month. Aspirin regularly. 4L O2 home oxygen.  Armandina Gemma ticket and pink form at bedside. VSS, 95, 140/94, 91% on 4L, 16 RR.

## 2014-07-24 NOTE — ED Provider Notes (Signed)
CSN: [id number]     Arrival date & time 07/24/14  0253 History   First MD Initiated Contact with Patient 07/24/14 0300     Chief Complaint  Patient presents with  . Fall     (Consider location/radiation/quality/duration/timing/severity/associated sxs/prior Treatment) HPI  This is a 80 year old female with a history of bronchiectasis on 4 L of oxygen, TIA, dementia who presents from her facility with an unwitnessed fall. Noted to have hematoma to the left forehead, elbow, and wrist pain. Vital signs stable in route. At baseline patient is only oriented to herself consistently. She is DO NOT RESUSCITATE/DO NOT INTUBATE.  Patient is unable to provide history regarding events leading up to the fall. She reports left wrist and elbow pain and facial pain.  Level V caveat for dementia  Past Medical History  Diagnosis Date  . Bronchiectasis with acute exacerbation 05/16/14    acute respiratory failure   . Other and unspecified hyperlipidemia   . TIA (transient ischemic attack)   . Osteoporosis, unspecified   . Asthma   . Esophageal reflux   . IBS (irritable bowel syndrome)   . Diverticulosis   . Hemorrhoids   . Hypothyroidism   . Schatzki's ring   . Hiatal hernia   . Tubular adenoma of colon 12/2008  . Chronic respiratory failure 05/16/14  . HCAP (healthcare-associated pneumonia) 05/16/14  . Sinusitis   . GERD (gastroesophageal reflux disease)   . HTN (hypertension)   . Encephalopathy 05/16/14    noted during hospitalization  . Depressive disorder, not elsewhere classified   . Diastolic dysfunction, left ventricle 05/16/14  . Memory loss   . Pubic ramus fracture   . Hiatal hernia   . Tubular adenoma of colon   . Hypoxia   . Anemia, unspecified   . Constipation    Past Surgical History  Procedure Laterality Date  . Cholecystectomy  1995  . Nasal sinus surgery    . Tonsillectomy    . Foot surgery    . Small intestine surgery  05/2008   Family History  Problem Relation Age of  Onset  . Hypertension Sister   . Heart disease Mother   . Heart disease Father   . Stroke Father   . Multiple myeloma Maternal Aunt   . Colon cancer Mother    History  Substance Use Topics  . Smoking status: Never Smoker   . Smokeless tobacco: Never Used  . Alcohol Use: No   OB History   Grav Para Term Preterm Abortions TAB SAB Ect Mult Living                 Review of Systems  Unable to perform ROS: Dementia      Allergies  Chocolate; Codeine; Erythromycin ethylsuccinate; Iodine; Iohexol; Sulfamethoxazole; and Septra  Home Medications   Prior to Admission medications   Medication Sig Start Date End Date Taking? Authorizing Provider  albuterol (PROVENTIL) (2.5 MG/3ML) 0.083% nebulizer solution Take 2.5 mg by nebulization every 3 (three) hours as needed for wheezing or shortness of breath.   Yes Historical Provider, MD  ALPRAZolam (XANAX) 0.25 MG tablet Take 1 tablet (0.25 mg total) by mouth 2 (two) times daily as needed for anxiety. 05/30/14  Yes Dawood Elgergawy, MD  arformoterol (BROVANA) 15 MCG/2ML NEBU Take 15 mcg by nebulization every 12 (twelve) hours.   Yes Historical Provider, MD  aspirin EC 81 MG tablet Take 81 mg by mouth daily.   Yes Historical Provider, MD  bisoprolol (ZEBETA) 5 MG  tablet Take 2.5 mg by mouth every morning.    Yes Historical Provider, MD  budesonide (PULMICORT) 0.5 MG/2ML nebulizer solution Take 0.5 mg by nebulization 2 (two) times daily.   Yes Historical Provider, MD  calcium-vitamin D (OSCAL WITH D) 500-200 MG-UNIT per tablet Take 1 tablet by mouth 2 (two) times daily.     Yes Historical Provider, MD  cetirizine (ZYRTEC) 10 MG tablet Take 10 mg by mouth at bedtime.   Yes Historical Provider, MD  clopidogrel (PLAVIX) 75 MG tablet Take 75 mg by mouth every morning.    Yes Historical Provider, MD  famotidine (PEPCID) 20 MG tablet Take 20 mg by mouth at bedtime.    Yes Historical Provider, MD  feeding supplement, RESOURCE BREEZE, (RESOURCE BREEZE)  LIQD Take 120 mLs by mouth 3 (three) times daily between meals.    Yes Historical Provider, MD  fluticasone (FLONASE) 50 MCG/ACT nasal spray Place 2 sprays into both nostrils every morning.    Yes Historical Provider, MD  guaiFENesin (MUCINEX) 600 MG 12 hr tablet Take 600 mg by mouth 2 (two) times daily.   Yes Historical Provider, MD  ipratropium-albuterol (DUONEB) 0.5-2.5 (3) MG/3ML SOLN Take 3 mLs by nebulization every 6 (six) hours.   Yes Historical Provider, MD  latanoprost (XALATAN) 0.005 % ophthalmic solution Place 1 drop into both eyes at bedtime.  12/14/10  Yes Historical Provider, MD  levothyroxine (SYNTHROID, LEVOTHROID) 150 MCG tablet Take 150 mcg by mouth daily before breakfast.   Yes Historical Provider, MD  moxifloxacin (AVELOX) 400 MG tablet Take 400 mg by mouth daily at 8 pm.   Yes Historical Provider, MD  nisoldipine (SULAR) 8.5 MG 24 hr tablet Take 8.5 mg by mouth every morning.    Yes Historical Provider, MD  polyethylene glycol (MIRALAX / GLYCOLAX) packet Take 17 g by mouth 2 (two) times daily.   Yes Historical Provider, MD  predniSONE (DELTASONE) 10 MG tablet Take 10 mg by mouth daily with breakfast.   Yes Historical Provider, MD  predniSONE (DELTASONE) 20 MG tablet Take 40 mg by mouth daily with breakfast.   Yes Historical Provider, MD  Probiotic Product (PROBIOTIC DAILY) CAPS Take 1 capsule by mouth every morning.   Yes Historical Provider, MD  saccharomyces boulardii (FLORASTOR) 250 MG capsule Take 250 mg by mouth 2 (two) times daily.   Yes Historical Provider, MD  senna-docusate (SENOKOT-S) 8.6-50 MG per tablet Take 2 tablets by mouth 2 (two) times daily.   Yes Historical Provider, MD  traMADol (ULTRAM) 50 MG tablet Take 50 mg by mouth every 12 (twelve) hours as needed for moderate pain.   Yes Historical Provider, MD   BP 183/98  Pulse 92  Temp(Src) 97.8 F (36.6 C) (Oral)  Resp 24  Ht $R'5\' 2"'pH$  (1.575 m)  Wt 115 lb (52.164 kg)  BMI 21.03 kg/m2  SpO2 99% Physical Exam   Nursing note and vitals reviewed. Constitutional: She is oriented to person, place, and time. No distress.  Elderly  HENT:  Head: Normocephalic.  Hematoma noted to the left temporal region with abrasion, bleeding controlled  Eyes: EOM are normal. Pupils are equal, round, and reactive to light.  Neck: Neck supple.  Cardiovascular: Normal rate, regular rhythm and normal heart sounds.   No murmur heard. Pulmonary/Chest: Effort normal. No respiratory distress.  Coarse breath sounds bilaterally  Abdominal: Soft. Bowel sounds are normal. There is no tenderness.  Musculoskeletal:  Full range of motion of the bilateral hips and knees without pain or obvious  deformity, tenderness palpation over the left wrist with obvious deformity, minimal swelling, 2+ radial pulse, skin tear noted over the left elbow  Neurological: She is alert and oriented to person, place, and time.  Skin: Skin is warm and dry.  See musculoskeletal above    ED Course  Procedures (including critical care time) Labs Review Labs Reviewed  URINALYSIS, ROUTINE W REFLEX MICROSCOPIC - Abnormal; Notable for the following:    APPearance CLOUDY (*)    Nitrite POSITIVE (*)    Leukocytes, UA SMALL (*)    All other components within normal limits  URINE MICROSCOPIC-ADD ON - Abnormal; Notable for the following:    Bacteria, UA MANY (*)    All other components within normal limits  CBC WITH DIFFERENTIAL - Abnormal; Notable for the following:    WBC 15.4 (*)    Hemoglobin 10.3 (*)    HCT 32.9 (*)    MCH 24.8 (*)    RDW 16.3 (*)    Neutro Abs 11.5 (*)    Monocytes Absolute 1.7 (*)    All other components within normal limits  BASIC METABOLIC PANEL - Abnormal; Notable for the following:    Glucose, Bld 103 (*)    GFR calc non Af Amer 78 (*)    All other components within normal limits  URINE CULTURE    Imaging Review Dg Chest 2 View  07/24/2014   CLINICAL DATA:  Fall  EXAM: CHEST  2 VIEW  COMPARISON:  05/27/2014   FINDINGS: Heart size and mediastinal contours within normal range. Diffuse reticulonodular opacities. Lower lobe bronchiectasis. Similar left lung base opacity. No overt pleural effusion or pneumothorax. Diffuse osteopenia. No definite acute osseous finding.  IMPRESSION: Similar reticulonodular interstitial prominence and lower lobe bronchiectasis.  More confluent left greater than right lung base opacities may reflect atelectasis or infiltrate.   Electronically Signed   By: Carlos Levering M.D.   On: 07/24/2014 04:08   Dg Elbow Complete Left  07/24/2014   CLINICAL DATA:  Fall  EXAM: LEFT ELBOW - COMPLETE 3+ VIEW  COMPARISON:  None.  FINDINGS: Four views of the left elbow show no displaced fracture or dislocation. No aggressive osseous lesion. No overt joint effusion.  IMPRESSION: No acute or aggressive osseous finding of the left elbow.  If clinical concern for an acute fracture persists, recommend a repeat radiograph in 7-10 days to evaluate for interval change or callus formation.   Electronically Signed   By: Carlos Levering M.D.   On: 07/24/2014 04:05   Dg Wrist Complete Left  07/24/2014   CLINICAL DATA:  Fall, left wrist bruising and deformity.  EXAM: LEFT WRIST - COMPLETE 3+ VIEW  COMPARISON:  Contemporaneous elbow radiographs  FINDINGS: There is an impacted and comminuted fracture of the distal left radius with mild dorsal displacement and angulation. There is intra-articular extension into the radiocarpal joint and into the distal radial ulnar joint as well. Slight ulnar positive variance. Diffuse osteopenia. Atherosclerotic vascular calcifications. Soft tissue swelling.  IMPRESSION: Comminuted impaction fracture of the distal left radius with dorsal angulation and intra-articular extension.  Ulnar positive variance.   Electronically Signed   By: Carlos Levering M.D.   On: 07/24/2014 04:11   Ct Head Wo Contrast  07/24/2014   CLINICAL DATA:  Left forehead hematoma status post fall.  EXAM: CT  HEAD WITHOUT CONTRAST  CT CERVICAL SPINE WITHOUT CONTRAST  TECHNIQUE: Multidetector CT imaging of the head and cervical spine was performed following the standard protocol without intravenous  contrast. Multiplanar CT image reconstructions of the cervical spine were also generated.  COMPARISON:  04/29/2012  FINDINGS: CT HEAD FINDINGS  Moderate subcortical and periventricular white matter hypodensities, a nonspecific finding often seen in the setting of chronic microangiopathic change. Bilateral basal ganglia rounded hypodensities, favored to reflect remote lacunar infarctions. No definite CT evidence for acute cortical based (large artery) infarction. No intraparenchymal hemorrhage, mass, mass effect, or abnormal extra-axial fluid collection. Mild cerebral volume loss. Ventricular prominence is mildly disproportionate to volume loss elsewhere. Atherosclerotic vascular calcifications. Left frontal scalp hematoma. Evidence of chronic sinusitis with wall thickening of the maxillary sinuses. Opacified sphenoid chambers and partially opacified ethmoid air cells and frontal sinus. Right maxillary sinus mucosal thickening. Partially opacified mastoid air cells. No displaced calvarial fracture.  CT CERVICAL SPINE FINDINGS  Lung apices show ground-glass opacities and multifocal impacted bronchioles. Atherosclerotic vascular calcifications. Maintained craniocervical relationship. Similar impaction of the dens and clivus with osseous remodeling. Multilevel degenerative changes, most pronounced at C4-5. Multifocal lucencies are similar to prior and may be degenerative or secondary to osteopenia. Mild anterolisthesis of C6 on C7. No displaced acute fracture or dislocation.  IMPRESSION: Left frontal scalp hematoma.  No underlying calvarial fracture.  Ventriculomegaly, similar to prior. May reflect central cerebral volume loss or normal pressure hydrocephalus.  White matter changes are similar to prior, a nonspecific finding most  often seen in the setting of chronic microangiopathic change.  Extensive paranasal sinus disease as above. May reflect acute on chronic sinusitis in the appropriate clinical setting.  Bilateral mastoid effusions.  Multilevel degenerative changes of the cervical spine. No acute osseous finding.  Lung apices show ground-glass opacities and impacted bronchioles,, similar to prior.   Electronically Signed   By: Carlos Levering M.D.   On: 07/24/2014 04:43   Ct Cervical Spine Wo Contrast  07/24/2014   CLINICAL DATA:  Left forehead hematoma status post fall.  EXAM: CT HEAD WITHOUT CONTRAST  CT CERVICAL SPINE WITHOUT CONTRAST  TECHNIQUE: Multidetector CT imaging of the head and cervical spine was performed following the standard protocol without intravenous contrast. Multiplanar CT image reconstructions of the cervical spine were also generated.  COMPARISON:  04/29/2012  FINDINGS: CT HEAD FINDINGS  Moderate subcortical and periventricular white matter hypodensities, a nonspecific finding often seen in the setting of chronic microangiopathic change. Bilateral basal ganglia rounded hypodensities, favored to reflect remote lacunar infarctions. No definite CT evidence for acute cortical based (large artery) infarction. No intraparenchymal hemorrhage, mass, mass effect, or abnormal extra-axial fluid collection. Mild cerebral volume loss. Ventricular prominence is mildly disproportionate to volume loss elsewhere. Atherosclerotic vascular calcifications. Left frontal scalp hematoma. Evidence of chronic sinusitis with wall thickening of the maxillary sinuses. Opacified sphenoid chambers and partially opacified ethmoid air cells and frontal sinus. Right maxillary sinus mucosal thickening. Partially opacified mastoid air cells. No displaced calvarial fracture.  CT CERVICAL SPINE FINDINGS  Lung apices show ground-glass opacities and multifocal impacted bronchioles. Atherosclerotic vascular calcifications. Maintained  craniocervical relationship. Similar impaction of the dens and clivus with osseous remodeling. Multilevel degenerative changes, most pronounced at C4-5. Multifocal lucencies are similar to prior and may be degenerative or secondary to osteopenia. Mild anterolisthesis of C6 on C7. No displaced acute fracture or dislocation.  IMPRESSION: Left frontal scalp hematoma.  No underlying calvarial fracture.  Ventriculomegaly, similar to prior. May reflect central cerebral volume loss or normal pressure hydrocephalus.  White matter changes are similar to prior, a nonspecific finding most often seen in the setting of chronic microangiopathic change.  Extensive paranasal sinus disease as above. May reflect acute on chronic sinusitis in the appropriate clinical setting.  Bilateral mastoid effusions.  Multilevel degenerative changes of the cervical spine. No acute osseous finding.  Lung apices show ground-glass opacities and impacted bronchioles,, similar to prior.   Electronically Signed   By: Carlos Levering M.D.   On: 07/24/2014 04:43     EKG Interpretation None      MDM   Final diagnoses:  Fall  UTI (lower urinary tract infection)  Radius distal fracture, left, closed, initial encounter    Patient presents following an unwitnessed fall. Obvious trauma to the left temporal region, left arm and wrist. Patient is unable to provide history. Vital signs are largely reassuring. She is satting 88-91% on 4 L home oxygen. No obvious respiratory distress.  Initial workup initiated including EKG, urinalysis, and imaging. EKG is unchanged from prior. Urinalysis with nitrite-positive urine. Culture sent and patient given Rocephin. Lab work added. Patient is afebrile. CT head and neck shows no evidence of acute bleed.  Left wrist film shows impacted distal radius fracture with intra-articular extension. Discussed fracture management with Dr. Lenon Curt.  Surgical intervention at this time. Will attempt to give pain control and  reduce. Dr. Lenon Curt recommends Velcro splint and follow-up in one to 2 weeks. Unfortunately, given patient's chronic respiratory issues, she is a poor candidate for sedation for reduction. Patient was given morphine and reduction was attempted but not well tolerated. Neurovascularly exam following attempt reassuring. Patient placed in Velcro splint.  Given UTI, fall, and fracture, will admit for observation. Patient will need close monitoring with pain management given her respiratory status.    Merryl Hacker, MD 07/24/14 [phone]

## 2014-07-24 NOTE — ED Notes (Signed)
Updated Kristeen Miss RN at [facility]

## 2014-07-24 NOTE — Progress Notes (Signed)
   Notified by Gwenlyn Perking RN, O2 sats went down to 40%, placed on NRBM.  NPO, as she failed her bedside swallow eval.  I d/w Mrs Flo Shanks RN hospice liaison, pt was on palliative care and status switched to hospice 3 days ago.  I called her sister [name] @ [phone] and updated about the hypoxic status.  Explained to her if hypoxia continues breathing might worsens and pt might expires.  Sister wants to be called with any updates.  Birdie Hopes Pager: [phone] 07/24/2014, 6:15 PM

## 2014-07-24 NOTE — Progress Notes (Signed)
Patient 's saturation 30% at 6  Lpm/[place],,alert to self,vrbally responsive,crakles on both lung fields,using accessories muscles on her breathing at 28/min. Placed  Non re-breather mask ,patient 's saturation gradually creeping up to 95%. MD on call paged and made aware.

## 2014-07-24 NOTE — Progress Notes (Signed)
Occupational Therapy Evaluation Patient Details Name: [name] MRN: [id number] DOB: 1933/06/17 Today's Date: 07/24/2014    History of Present Illness [name] is a 80 y.o. female with history of bronchiectasis, chronic hypoxic respiratory disorder on 4 L of oxygen, TIA and advanced dementia. Patient lives in a nursing home and she was sent to cause of her witnessed fall. Patient is demented and unable to provide history, history obtained from records. Patient have left forehead hematoma and left forearm pain.   Clinical Impression   Very limited eval due to pt lethargy, pain, and awaiting results of hand/ortho consult. OT will follow acutely. Also, no family present to give PLOF.    Follow Up Recommendations  SNF;Supervision/Assistance - 24 hour    Equipment Recommendations  Other (comment) (tbd)    Recommendations for Other Services       Precautions / Restrictions Precautions Precautions: Fall;Other (comment) (L wrist in brace) Required Braces or Orthoses: Other Brace/Splint (L wrist brace on pt) Restrictions Weight Bearing Restrictions: Yes Other Position/Activity Restrictions: presumed NWB LUE      Mobility Bed Mobility                  Transfers                      Balance                                            ADL Overall ADL's : Needs assistance/impaired     Grooming: Wash/dry face;Minimal assistance (R UE)   Upper Body Bathing: Total assistance   Lower Body Bathing: Total assistance   Upper Body Dressing : Total assistance   Lower Body Dressing: Total assistance                       Vision                     Perception     Praxis      Pertinent Vitals/Pain Pain Assessment: Faces Pain Score: 6  Pain Location: unknown and pt unable to state Pain Intervention(s): Limited activity within patient's tolerance     Hand Dominance Right   Extremity/Trunk Assessment Upper  Extremity Assessment Upper Extremity Assessment: LUE deficits/detail;RUE deficits/detail RUE Deficits / Details: AAROM WFL, strength 2-/5 LUE Deficits / Details: ROM/strength NT due to fx; note pt in wrist brace   Lower Extremity Assessment Lower Extremity Assessment: Defer to PT evaluation       Communication Communication Communication: No difficulties   Cognition Arousal/Alertness: Lethargic Behavior During Therapy: Restless Overall Cognitive Status: History of cognitive impairments - at baseline       Memory: Decreased short-term memory;Decreased recall of precautions             General Comments       Exercises       Shoulder Instructions      Home Living Family/patient expects to be discharged to:: Skilled nursing facility                                 Additional Comments: from [place]      Prior Functioning/Environment Level of Independence: Needs assistance        Comments: Patient with advanced dementia  and unable to report PLOF    OT Diagnosis: Acute pain;Generalized weakness;Cognitive deficits   OT Problem List: Decreased strength;Decreased range of motion;Decreased activity tolerance;Decreased cognition;Decreased safety awareness;Decreased knowledge of precautions;Pain;Impaired UE functional use   OT Treatment/Interventions: Self-care/ADL training;Therapeutic exercise;DME and/or AE instruction;Therapeutic activities;Patient/family education    OT Goals(Current goals can be found in the care plan section) Acute Rehab OT Goals Patient Stated Goal: none stated OT Goal Formulation: Patient unable to participate in goal setting Time For Goal Achievement: 07/31/14 Potential to Achieve Goals: Fair  OT Frequency: Min 1X/week   Barriers to D/C:            Co-evaluation              End of Session    Activity Tolerance: Patient limited by fatigue;Patient limited by lethargy;Patient limited by pain;Treatment limited  secondary to medical complications (Comment) (Awaiting hand/ortho consult for new L wrist fx) Patient left: in bed;with call bell/phone within reach;with bed alarm set   Time: 1547-1600 OT Time Calculation (min): 13 min Charges:  OT General Charges $OT Visit: 1 Procedure OT Evaluation $Initial OT Evaluation Tier I: 1 Procedure G-Codes:    Varonica Siharath A 08/01/14, 4:21 PM

## 2014-07-24 NOTE — Progress Notes (Signed)
Pt failed RN swallow screen. Reported that she is on a dysphagia diet with thickened liquids at [facility].  ST swallow evaluation.

## 2014-07-24 NOTE — ED Notes (Signed)
Pt saturation in the low 80%'s.  MD Horton made aware and O2 increased from 4L to 6L.  Patients O2 at 99% prior to being transported upstairs.

## 2014-07-25 ENCOUNTER — Other Ambulatory Visit: Payer: Self-pay | Admitting: Nurse Practitioner

## 2014-07-25 ENCOUNTER — Encounter: Payer: Self-pay | Admitting: Nurse Practitioner

## 2014-07-25 DIAGNOSIS — E876 Hypokalemia: Secondary | ICD-10-CM

## 2014-07-25 DIAGNOSIS — J471 Bronchiectasis with (acute) exacerbation: Secondary | ICD-10-CM

## 2014-07-25 DIAGNOSIS — E871 Hypo-osmolality and hyponatremia: Secondary | ICD-10-CM

## 2014-07-25 DIAGNOSIS — J9601 Acute respiratory failure with hypoxia: Secondary | ICD-10-CM

## 2014-07-25 DIAGNOSIS — S5292XA Unspecified fracture of left forearm, initial encounter for closed fracture: Secondary | ICD-10-CM

## 2014-07-25 DIAGNOSIS — G9340 Encephalopathy, unspecified: Secondary | ICD-10-CM

## 2014-07-25 LAB — CBC
HEMATOCRIT: 35.3 % — AB (ref 36.0–46.0)
HEMOGLOBIN: 10.8 g/dL — AB (ref 12.0–15.0)
MCH: 24.4 pg — AB (ref 26.0–34.0)
MCHC: 30.6 g/dL (ref 30.0–36.0)
MCV: 79.9 fL (ref 78.0–100.0)
Platelets: 325 10*3/uL (ref 150–400)
RBC: 4.42 MIL/uL (ref 3.87–5.11)
RDW: 16.7 % — ABNORMAL HIGH (ref 11.5–15.5)
WBC: 14.6 10*3/uL — ABNORMAL HIGH (ref 4.0–10.5)

## 2014-07-25 LAB — BASIC METABOLIC PANEL
Anion gap: 13 (ref 5–15)
BUN: 20 mg/dL (ref 6–23)
CHLORIDE: 98 meq/L (ref 96–112)
CO2: 29 mEq/L (ref 19–32)
CREATININE: 0.74 mg/dL (ref 0.50–1.10)
Calcium: 8.2 mg/dL — ABNORMAL LOW (ref 8.4–10.5)
GFR calc non Af Amer: 79 mL/min — ABNORMAL LOW (ref >90)
GLUCOSE: 108 mg/dL — AB (ref 70–99)
Potassium: 3.6 mEq/L — ABNORMAL LOW (ref 3.7–5.3)
Sodium: 140 mEq/L (ref 137–147)

## 2014-07-25 MED ORDER — IPRATROPIUM BROMIDE 0.02 % IN SOLN
0.5000 mg | Freq: Four times a day (QID) | RESPIRATORY_TRACT | Status: DC
  Administered 2014-07-25 – 2014-07-26 (×6): 0.5 mg via RESPIRATORY_TRACT
  Filled 2014-07-25 (×5): qty 2.5

## 2014-07-25 MED ORDER — MORPHINE SULFATE 2 MG/ML IJ SOLN: 0.5000 mg | INTRAMUSCULAR | Status: DC | PRN

## 2014-07-25 MED ORDER — IPRATROPIUM BROMIDE 0.02 % IN SOLN
RESPIRATORY_TRACT | Status: AC
  Filled 2014-07-25: qty 2.5

## 2014-07-25 MED ORDER — LEVALBUTEROL HCL 0.63 MG/3ML IN NEBU
INHALATION_SOLUTION | RESPIRATORY_TRACT | Status: AC
  Administered 2014-07-25: 0.63 mg
  Filled 2014-07-25: qty 3

## 2014-07-25 MED ORDER — METHYLPREDNISOLONE SODIUM SUCC 125 MG IJ SOLR
60.0000 mg | Freq: Two times a day (BID) | INTRAMUSCULAR | Status: DC
  Administered 2014-07-25 – 2014-07-26 (×2): 60 mg via INTRAVENOUS
  Filled 2014-07-25: qty 2
  Filled 2014-07-25 (×4): qty 0.96

## 2014-07-25 MED ORDER — MORPHINE SULFATE (CONCENTRATE) 10 MG /0.5 ML PO SOLN
5.0000 mg | ORAL | Status: DC | PRN
  Administered 2014-07-25 – 2014-07-27 (×4): 5 mg via SUBLINGUAL
  Filled 2014-07-25 (×4): qty 0.5

## 2014-07-25 MED ORDER — MORPHINE SULFATE 2 MG/ML IJ SOLN: 1.0000 mg | INTRAMUSCULAR | Status: DC | PRN

## 2014-07-25 MED ORDER — LEVALBUTEROL HCL 0.63 MG/3ML IN NEBU
0.6300 mg | INHALATION_SOLUTION | Freq: Four times a day (QID) | RESPIRATORY_TRACT | Status: DC
  Administered 2014-07-25 – 2014-07-26 (×5): 0.63 mg via RESPIRATORY_TRACT
  Filled 2014-07-25 (×9): qty 3

## 2014-07-25 NOTE — Progress Notes (Signed)
INITIAL NUTRITION ASSESSMENT  Pt meets criteria for SEVERE MALNUTRITION in the context of chronic illness as evidenced by severe fat and muscle mass loss.  DOCUMENTATION CODES Per approved criteria  -Severe malnutrition in the context of chronic illness   INTERVENTION: Continue Resource Breeze po TID, each supplement provides 250 kcal and 9 grams of protein.  Will continue to monitor.  NUTRITION DIAGNOSIS: Inadequate oral intake related to inability to swallow as evidenced by pt report.   Goal: Pt to meet >/= 90% of their estimated nutrition needs   Monitor:  PO intake, weight trends, labs, I/O's  Reason for Assessment: MST  80 y.o. female  Admitting Dx: UTI (lower urinary tract infection)  ASSESSMENT: Pt with history of bronchiectasis, chronic hypoxic respiratory disorder on 4 L of oxygen, TIA and advanced dementia. Pt is resident of [facility] and was admitted after witnessed fall. Sustained left forehead hematoma and left closed comminuted distal radial fracture. Notes state that pt was on a dysphagia diet with thickened liquids at SNF, but recently transferred to hospice with comfort approach. Pt has been desaturating and now on NRB.  Pt reports her appetite is "ok" currently and PTA. Pt reports she has been able to eat 3 meals a day. Per Epic weight records, weight has been stable with her usual body weight of 105 lbs. Pt is now on a dysphagia diet, however pt reports she can not swallow. Pt unable to take her meds PO per RN note.  Nutrition Focused Physical Exam:  Subcutaneous Fat:  Orbital Region: N/A Upper Arm Region: Severe depletion Thoracic and Lumbar Region: Severe depletion  Muscle:  Temple Region: Severe depletion Clavicle Bone Region: Severe depletion Clavicle and Acromion Bone Region: Severe depletion Scapular Bone Region: N/A Dorsal Hand: Severe depletion Patellar Region: Severe depletion Anterior Thigh Region: Severe depletion Posterior Calf  Region: Severe depletion  Edema: none  Labs: Low potassium, calcium, and GFR.  Height: Ht Readings from Last 1 Encounters:  07/24/14 5\' 2"  (1.575 m)    Weight: Wt Readings from Last 1 Encounters:  07/24/14 103 lb 6.3 oz (46.9 kg)    Ideal Body Weight: 110  % Ideal Body Weight: 94%  Wt Readings from Last 10 Encounters:  07/24/14 103 lb 6.3 oz (46.9 kg)  05/29/14 114 lb 10.2 oz (52 kg)  05/22/14 118 lb (53.524 kg)  05/16/14 97 lb (44 kg)  03/11/14 106 lb 3.2 oz (48.172 kg)  03/04/14 105 lb (47.628 kg)  02/10/14 104 lb (47.174 kg)  01/27/14 105 lb (47.628 kg)  10/28/13 105 lb (47.628 kg)  09/16/13 107 lb 12.8 oz (48.898 kg)    Usual Body Weight: 105 lbs  % Usual Body Weight: 98%  BMI:  Body mass index is 18.91 kg/(m^2).  Estimated Nutritional Needs: Kcal: 1500-1700  Protein: 65- 75 grams  Fluid: 1.5-1.7L/day  Skin: intact  Diet Order: Dysphagia 3  EDUCATION NEEDS: -Education not appropriate at this time   Intake/Output Summary (Last 24 hours) at 07/25/14 1554 Last data filed at 07/25/14 1246  Gross per 24 hour  Intake      0 ml  Output   1100 ml  Net  -1100 ml    Last BM: PTA  Labs:   Recent Labs Lab 07/22/14 07/24/14 0506 07/25/14 0455  NA 132* 138 140  K 4.2 4.6 3.6*  CL  --  98 98  CO2  --  31 29  BUN 18 19 20   CREATININE 0.8 0.77 0.74  CALCIUM  --  8.8 8.2*  GLUCOSE  --  103* 108*    CBG (last 3)  No results found for this basename: GLUCAP,  in the last 72 hours  Scheduled Meds: . acidophilus  1 capsule Oral Daily  . arformoterol  15 mcg Nebulization Q12H  . aspirin EC  81 mg Oral Daily  . bisoprolol  2.5 mg Oral q morning - 10a  . budesonide  0.5 mg Nebulization BID  . calcium-vitamin D  1 tablet Oral BID  . cefTRIAXone (ROCEPHIN)  IV  1 g Intravenous Q24H  . clopidogrel  75 mg Oral q morning - 10a  . famotidine  20 mg Oral QHS  . feeding supplement (RESOURCE BREEZE)  1 Container Oral TID BM  . fluticasone  2 spray Each  Nare q morning - 10a  . guaiFENesin  1,200 mg Oral BID  . heparin  5,000 Units Subcutaneous 3 times per day  . ipratropium      . levalbuterol  0.63 mg Nebulization QID   And  . ipratropium  0.5 mg Nebulization QID  . latanoprost  1 drop Both Eyes QHS  . levothyroxine  150 mcg Oral QAC breakfast  . methylPREDNISolone (SOLU-MEDROL) injection  60 mg Intravenous Q12H  . nisoldipine  8.5 mg Oral q morning - 10a  . polyethylene glycol  17 g Oral BID  . saccharomyces boulardii  250 mg Oral BID  . senna-docusate  2 tablet Oral BID  . sodium chloride  3 mL Intravenous Q12H    Continuous Infusions: . sodium chloride      Past Medical History  Diagnosis Date  . Bronchiectasis with acute exacerbation 05/16/14    acute respiratory failure   . Other and unspecified hyperlipidemia   . TIA (transient ischemic attack)   . Osteoporosis, unspecified   . Asthma   . Esophageal reflux   . IBS (irritable bowel syndrome)   . Diverticulosis   . Hemorrhoids   . Hypothyroidism   . Schatzki's ring   . Hiatal hernia   . Tubular adenoma of colon 12/2008  . Chronic respiratory failure 05/16/14  . HCAP (healthcare-associated pneumonia) 05/16/14  . Sinusitis   . GERD (gastroesophageal reflux disease)   . HTN (hypertension)   . Encephalopathy 05/16/14    noted during hospitalization  . Depressive disorder, not elsewhere classified   . Diastolic dysfunction, left ventricle 05/16/14  . Memory loss   . Pubic ramus fracture   . Hiatal hernia   . Tubular adenoma of colon   . Hypoxia   . Anemia, unspecified   . Constipation     Past Surgical History  Procedure Laterality Date  . Cholecystectomy  1995  . Nasal sinus surgery    . Tonsillectomy    . Foot surgery    . Small intestine surgery  05/2008    Kallie Locks, MS, RD, LDN Pager # [phone] After hours/ weekend pager # [phone]

## 2014-07-25 NOTE — Progress Notes (Signed)
Pt found with NRB off her face, sats in the 50s upon arrival to the room. Placed pt back on NRB and gave nebulizer treatment. pts sats returned to the 90s.

## 2014-07-25 NOTE — Progress Notes (Signed)
This encounter was created in error - please disregard.

## 2014-07-25 NOTE — Clinical Social Work Psychosocial (Signed)
Clinical Social Work Department BRIEF PSYCHOSOCIAL ASSESSMENT 07/25/2014  Patient:  [name], [name]     Account Number:  [id number]     Admit date:  07/24/2014  Clinical Social Worker:  Frederico Hamman  Date/Time:  07/25/2014 12:57 PM  Referred by:  Physician  Date Referred:  07/24/2014 Referred for  SNF Placement   Other Referral:   Interview type:  Family Other interview type:   CSW also talked with [name], Education officer, museum at [place]:  [place] Admitted from facility:  [facility] Level of care:  [place] Primary support name:  [name] Primary support relationship to patient:  [place] Degree of support available:   [name] [phone]) is only family, lives out of town and assists patient with decision making.    CURRENT CONCERNS Current Concerns  Post-Acute Placement   Other Concerns:    SOCIAL WORK ASSESSMENT / PLAN CSW initially talked with [name] at [facility] who confirmed that patient is from their skilled facility. CSW provided with sister's name and contact information and informed that patient started with Hospice care this week.    CSW contacted sister and explained CSW's role with patient. [name] thanked CSW for call and reported that she is on the way to [place] and is currently somewhere in [place]. She anticipates arriving in [place] around 4:30 or so.   Assessment/plan status:  Psychosocial Support/Ongoing Assessment of Needs Other assessment/ plan:   Information/referral to community resources:   None needed or requested at this time.    PATIENT'S/FAMILY'S RESPONSE TO PLAN OF CARE: Sister appreciative of CSW's call regarding her sister. She is currently in route to [facility] to see patient.

## 2014-07-25 NOTE — Progress Notes (Signed)
Patient ID: [name]  female  OYD:[id number]    DOB: December 16, 1932    DOA: 07/24/2014  PCP: Christinia Gully, MD  Brief history of present illness Patient is a 80 year old female with history of chronic respiratory failure, bronchiectasis on 4 L O2, advanced dementia, under hospice care, was sent from nursing facility due to witnessed fall UA showed UTI left upper activity x-ray showed closed comminuted distal radial fracture.  Assessment/Plan: Principal Problem:   UTI (lower urinary tract infection) - Continue IV Rocephin, follow urine culture and sensitivities  Active Problems:   Bronchiectasis with acute exacerbation, acute on chronic respiratory failure - Placed on IV steroids, Xopenex and Atrovent scheduled nebs qid, mucolytics will try to wean Ventimask - Followed by Dr. Melvyn Novas outpatient, currently under hospice care  - Currently on IV Rocephin - Chest x-ray does not show any acute pneumonia, will broaden antibiotic coverage if respiratory status worsening - Patient was seen by hospice RN today, recommended Roxanol    Hypothyroidism -Continue Synthroid was increased last month, TSH 4.8, no dose change for now     Fall With advanced Dementia - Continue fall precautions    Closed left radial fracture - Continue wrist splint   DVT Prophylaxis:  Code Status:DO NOT RESUSCITATE   Family Communication:  Disposition:  Consultants:  None  Procedures:   None  Antibiotics:   IV Rocephin    Subjective:  Patient seen and examined, alert, afebrile, on Ventimask  Objective: Weight change: -5.264 kg (-11 lb 9.7 oz)  Intake/Output Summary (Last 24 hours) at 07/25/14 1141 Last data filed at 07/25/14 0045  Gross per 24 hour  Intake      0 ml  Output   1400 ml  Net  -1400 ml   Blood pressure 155/80, pulse 98, temperature 98 F (36.7 C), temperature source Axillary, resp. rate 20, height 5\' 2"  (1.575 m), weight 46.9 kg (103 lb 6.3 oz), SpO2 91.00%.  Physical  Exam: General: Alert and awake, oriented  CVS: S1-S2 clear Chest:  Crackles bilaterally Abdomen: soft nontender, nondistended, normal bowel sounds  Extremities: no cyanosis, clubbing or edema noted bilaterally  Lab Results: Basic Metabolic Panel:  Recent Labs Lab 07/24/14 0506 07/25/14 0455  NA 138 140  K 4.6 3.6*  CL 98 98  CO2 31 29  GLUCOSE 103* 108*  BUN 19 20  CREATININE 0.77 0.74  CALCIUM 8.8 8.2*   Liver Function Tests: No results found for this basename: AST, ALT, ALKPHOS, BILITOT, PROT, ALBUMIN,  in the last 168 hours No results found for this basename: LIPASE, AMYLASE,  in the last 168 hours No results found for this basename: AMMONIA,  in the last 168 hours CBC:  Recent Labs Lab 07/24/14 0506 07/25/14 0455  WBC 15.4* 14.6*  NEUTROABS 11.5*  --   HGB 10.3* 10.8*  HCT 32.9* 35.3*  MCV 79.3 79.9  PLT 363 325   Cardiac Enzymes: No results found for this basename: CKTOTAL, CKMB, CKMBINDEX, TROPONINI,  in the last 168 hours BNP: No components found with this basename: POCBNP,  CBG: No results found for this basename: GLUCAP,  in the last 168 hours   Micro Results: Recent Results (from the past 240 hour(s))  MRSA PCR SCREENING     Status: None   Collection Time    07/24/14  9:19 AM      Result Value Ref Range Status   MRSA by PCR NEGATIVE  NEGATIVE Final   Comment:  The GeneXpert MRSA Assay (FDA     approved for NASAL specimens     only), is one component of a     comprehensive MRSA colonization     surveillance program. It is not     intended to diagnose MRSA     infection nor to guide or     monitor treatment for     MRSA infections.    Studies/Results: Dg Chest 2 View  07/24/2014   CLINICAL DATA:  Fall  EXAM: CHEST  2 VIEW  COMPARISON:  05/27/2014  FINDINGS: Heart size and mediastinal contours within normal range. Diffuse reticulonodular opacities. Lower lobe bronchiectasis. Similar left lung base opacity. No overt pleural  effusion or pneumothorax. Diffuse osteopenia. No definite acute osseous finding.  IMPRESSION: Similar reticulonodular interstitial prominence and lower lobe bronchiectasis.  More confluent left greater than right lung base opacities may reflect atelectasis or infiltrate.   Electronically Signed   By: Carlos Levering M.D.   On: 07/24/2014 04:08   Dg Elbow Complete Left  07/24/2014   CLINICAL DATA:  Fall  EXAM: LEFT ELBOW - COMPLETE 3+ VIEW  COMPARISON:  None.  FINDINGS: Four views of the left elbow show no displaced fracture or dislocation. No aggressive osseous lesion. No overt joint effusion.  IMPRESSION: No acute or aggressive osseous finding of the left elbow.  If clinical concern for an acute fracture persists, recommend a repeat radiograph in 7-10 days to evaluate for interval change or callus formation.   Electronically Signed   By: Carlos Levering M.D.   On: 07/24/2014 04:05   Dg Wrist Complete Left  07/24/2014   CLINICAL DATA:  Fall, left wrist bruising and deformity.  EXAM: LEFT WRIST - COMPLETE 3+ VIEW  COMPARISON:  Contemporaneous elbow radiographs  FINDINGS: There is an impacted and comminuted fracture of the distal left radius with mild dorsal displacement and angulation. There is intra-articular extension into the radiocarpal joint and into the distal radial ulnar joint as well. Slight ulnar positive variance. Diffuse osteopenia. Atherosclerotic vascular calcifications. Soft tissue swelling.  IMPRESSION: Comminuted impaction fracture of the distal left radius with dorsal angulation and intra-articular extension.  Ulnar positive variance.   Electronically Signed   By: Carlos Levering M.D.   On: 07/24/2014 04:11   Ct Head Wo Contrast  07/24/2014   CLINICAL DATA:  Left forehead hematoma status post fall.  EXAM: CT HEAD WITHOUT CONTRAST  CT CERVICAL SPINE WITHOUT CONTRAST  TECHNIQUE: Multidetector CT imaging of the head and cervical spine was performed following the standard protocol without  intravenous contrast. Multiplanar CT image reconstructions of the cervical spine were also generated.  COMPARISON:  04/29/2012  FINDINGS: CT HEAD FINDINGS  Moderate subcortical and periventricular white matter hypodensities, a nonspecific finding often seen in the setting of chronic microangiopathic change. Bilateral basal ganglia rounded hypodensities, favored to reflect remote lacunar infarctions. No definite CT evidence for acute cortical based (large artery) infarction. No intraparenchymal hemorrhage, mass, mass effect, or abnormal extra-axial fluid collection. Mild cerebral volume loss. Ventricular prominence is mildly disproportionate to volume loss elsewhere. Atherosclerotic vascular calcifications. Left frontal scalp hematoma. Evidence of chronic sinusitis with wall thickening of the maxillary sinuses. Opacified sphenoid chambers and partially opacified ethmoid air cells and frontal sinus. Right maxillary sinus mucosal thickening. Partially opacified mastoid air cells. No displaced calvarial fracture.  CT CERVICAL SPINE FINDINGS  Lung apices show ground-glass opacities and multifocal impacted bronchioles. Atherosclerotic vascular calcifications. Maintained craniocervical relationship. Similar impaction of the dens and clivus with  osseous remodeling. Multilevel degenerative changes, most pronounced at C4-5. Multifocal lucencies are similar to prior and may be degenerative or secondary to osteopenia. Mild anterolisthesis of C6 on C7. No displaced acute fracture or dislocation.  IMPRESSION: Left frontal scalp hematoma.  No underlying calvarial fracture.  Ventriculomegaly, similar to prior. May reflect central cerebral volume loss or normal pressure hydrocephalus.  White matter changes are similar to prior, a nonspecific finding most often seen in the setting of chronic microangiopathic change.  Extensive paranasal sinus disease as above. May reflect acute on chronic sinusitis in the appropriate clinical  setting.  Bilateral mastoid effusions.  Multilevel degenerative changes of the cervical spine. No acute osseous finding.  Lung apices show ground-glass opacities and impacted bronchioles,, similar to prior.   Electronically Signed   By: Carlos Levering M.D.   On: 07/24/2014 04:43   Ct Cervical Spine Wo Contrast  07/24/2014   CLINICAL DATA:  Left forehead hematoma status post fall.  EXAM: CT HEAD WITHOUT CONTRAST  CT CERVICAL SPINE WITHOUT CONTRAST  TECHNIQUE: Multidetector CT imaging of the head and cervical spine was performed following the standard protocol without intravenous contrast. Multiplanar CT image reconstructions of the cervical spine were also generated.  COMPARISON:  04/29/2012  FINDINGS: CT HEAD FINDINGS  Moderate subcortical and periventricular white matter hypodensities, a nonspecific finding often seen in the setting of chronic microangiopathic change. Bilateral basal ganglia rounded hypodensities, favored to reflect remote lacunar infarctions. No definite CT evidence for acute cortical based (large artery) infarction. No intraparenchymal hemorrhage, mass, mass effect, or abnormal extra-axial fluid collection. Mild cerebral volume loss. Ventricular prominence is mildly disproportionate to volume loss elsewhere. Atherosclerotic vascular calcifications. Left frontal scalp hematoma. Evidence of chronic sinusitis with wall thickening of the maxillary sinuses. Opacified sphenoid chambers and partially opacified ethmoid air cells and frontal sinus. Right maxillary sinus mucosal thickening. Partially opacified mastoid air cells. No displaced calvarial fracture.  CT CERVICAL SPINE FINDINGS  Lung apices show ground-glass opacities and multifocal impacted bronchioles. Atherosclerotic vascular calcifications. Maintained craniocervical relationship. Similar impaction of the dens and clivus with osseous remodeling. Multilevel degenerative changes, most pronounced at C4-5. Multifocal lucencies are similar  to prior and may be degenerative or secondary to osteopenia. Mild anterolisthesis of C6 on C7. No displaced acute fracture or dislocation.  IMPRESSION: Left frontal scalp hematoma.  No underlying calvarial fracture.  Ventriculomegaly, similar to prior. May reflect central cerebral volume loss or normal pressure hydrocephalus.  White matter changes are similar to prior, a nonspecific finding most often seen in the setting of chronic microangiopathic change.  Extensive paranasal sinus disease as above. May reflect acute on chronic sinusitis in the appropriate clinical setting.  Bilateral mastoid effusions.  Multilevel degenerative changes of the cervical spine. No acute osseous finding.  Lung apices show ground-glass opacities and impacted bronchioles,, similar to prior.   Electronically Signed   By: Carlos Levering M.D.   On: 07/24/2014 04:43   Dg Chest Portable 1 View  07/24/2014   CLINICAL DATA:  Shortness of breath.  Hypoxia.  EXAM: PORTABLE CHEST - 1 VIEW  COMPARISON:  Chest x-ray 07/24/2014,05/02/2014,08/07/2013,05/01/2012. CT chest 08/13/2013.  FINDINGS: Mediastinum hilar structures normal. Heart size normal. Pulmonary vascularity normal. Diffuse unchanged chronic reticular nodular interstitial infiltrates with bibasilar bronchiectasis again noted. No significant pleural effusion. No pneumothorax. Old right proximal humeral fracture.  IMPRESSION: Chronic bilateral reticular nodular interstitial infiltrates with bibasilar bronchiectasis and atelectasis. 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 No change from multiple prior exams.  Electronically Signed   By: Marcello Moores  Register   On: 07/24/2014 08:14    Medications: Scheduled Meds: . acidophilus  1 capsule Oral Daily  . arformoterol  15 mcg Nebulization Q12H  . aspirin EC  81 mg Oral Daily  . bisoprolol  2.5 mg Oral q morning - 10a  . budesonide  0.5 mg Nebulization BID  . calcium-vitamin D  1 tablet Oral BID  . cefTRIAXone  (ROCEPHIN)  IV  1 g Intravenous Q24H  . clopidogrel  75 mg Oral q morning - 10a  . famotidine  20 mg Oral QHS  . feeding supplement (RESOURCE BREEZE)  1 Container Oral TID BM  . fluticasone  2 spray Each Nare q morning - 10a  . guaiFENesin  1,200 mg Oral BID  . heparin  5,000 Units Subcutaneous 3 times per day  . ipratropium      . levalbuterol  0.63 mg Nebulization QID   And  . ipratropium  0.5 mg Nebulization QID  . latanoprost  1 drop Both Eyes QHS  . levothyroxine  150 mcg Oral QAC breakfast  . methylPREDNISolone (SOLU-MEDROL) injection  60 mg Intravenous Q12H  . nisoldipine  8.5 mg Oral q morning - 10a  . polyethylene glycol  17 g Oral BID  . saccharomyces boulardii  250 mg Oral BID  . senna-docusate  2 tablet Oral BID  . sodium chloride  3 mL Intravenous Q12H      LOS: 1 day   Aliah Eriksson M.D. [facility] 07/25/2014, 11:41 AM Pager: [phone]  If 7PM-7AM, please contact night-coverage [email or web address] Password [email or web address]

## 2014-07-25 NOTE — Evaluation (Signed)
Physical Therapy Evaluation Patient Details Name: [name] MRN: [id number] DOB: 03/11/1933 Today's Date: 07/25/2014   History of Present Illness  [name] is a 80 y.o. female with history of bronchiectasis, chronic hypoxic respiratory disorder on 4 L of oxygen, TIA and advanced dementia. Patient lives in a nursing home and she was sent to cause of her witnessed fall. Patient is demented and unable to provide history, history obtained from records. Patient have left forehead hematoma and left forearm pain.  Clinical Impression  Pt admitted with the above. Pt currently with functional limitations due to the deficits listed below (see PT Problem List). At the time of PT eval pt was able to perform transfers to/from EOB with total assist +2. Pt significantly limited by pain, Pt will benefit from skilled PT to increase their independence and safety with mobility to allow discharge to the venue listed below. At end of session, pt taking O2 mask off, stating she does not need it. Therapist redirected pt to situation and pt agreed to hold mask to her face. Would not allow therapist to put strap around her head, possibly due to pain from hematoma around L side of face.       Follow Up Recommendations SNF;Supervision/Assistance - 24 hour    Equipment Recommendations  None recommended by PT    Recommendations for Other Services       Precautions / Restrictions Precautions Precautions: Fall;Other (comment) (L wrist in brace) Required Braces or Orthoses: Other Brace/Splint (L wrist brace on pt) Other Brace/Splint: L wrist Restrictions Weight Bearing Restrictions: Yes Other Position/Activity Restrictions: presumed NWB LUE      Mobility  Bed Mobility Overal bed mobility: Needs Assistance;+2 for physical assistance Bed Mobility: Supine to Sit;Sit to Supine     Supine to sit: Total assist;+2 for physical assistance Sit to supine: Total assist;+2 for physical assistance   General  bed mobility comments: VC's for sequencing and technique. Bed pad was used to assist with scooting hips around to EOB. Total assist for elevating LE's back onto bed when returning pt to supine.   Transfers                 General transfer comment: Unable to mobilize OOB due to pain  Ambulation/Gait                Stairs            Wheelchair Mobility    Modified Rankin (Stroke Patients Only)       Balance Overall balance assessment: Needs assistance Sitting-balance support: Feet supported;Bilateral upper extremity supported Sitting balance-Leahy Scale: Poor   Postural control: Right lateral lean                                   Pertinent Vitals/Pain Pain Assessment: Faces Faces Pain Scale: Hurts whole lot Pain Location: Holding L-side (ribs) and hip Pain Intervention(s): Limited activity within patient's tolerance;Monitored during session    Home Living Family/patient expects to be discharged to:: Skilled nursing facility                 Additional Comments: from [place]    Prior Function Level of Independence: Needs assistance         Comments: Patient with advanced dementia and unable to report PLOF     Hand Dominance   Dominant Hand: Right    Extremity/Trunk Assessment   Upper Extremity  Assessment: Defer to OT evaluation           Lower Extremity Assessment: Generalized weakness;LLE deficits/detail   LLE Deficits / Details: Pt with bilateral gross strength deficits in LE's, however pt states it is much more difficult to move the LLE.   Cervical / Trunk Assessment: Kyphotic  Communication   Communication: No difficulties  Cognition Arousal/Alertness: Lethargic Behavior During Therapy: Restless Overall Cognitive Status: History of cognitive impairments - at baseline       Memory: Decreased short-term memory;Decreased recall of precautions              General Comments General comments (skin  integrity, edema, etc.): hematoma L eye/face    Exercises General Exercises - Lower Extremity Long Arc Quad: 5 reps;Both      Assessment/Plan    PT Assessment Patient needs continued PT services  PT Diagnosis Difficulty walking;Generalized weakness;Acute pain   PT Problem List Decreased strength;Decreased range of motion;Decreased activity tolerance;Decreased balance;Decreased mobility;Decreased knowledge of use of DME;Decreased safety awareness;Decreased knowledge of precautions;Cardiopulmonary status limiting activity  PT Treatment Interventions DME instruction;Gait training;Stair training;Functional mobility training;Therapeutic activities;Therapeutic exercise;Neuromuscular re-education;Patient/family education   PT Goals (Current goals can be found in the Care Plan section) Acute Rehab PT Goals Patient Stated Goal: none stated PT Goal Formulation: With patient Time For Goal Achievement: 08/08/14 Potential to Achieve Goals: Fair    Frequency Min 2X/week   Barriers to discharge        Co-evaluation               End of Session Equipment Utilized During Treatment: Oxygen Activity Tolerance: Patient limited by pain Patient left: in bed;with call bell/phone within reach;with bed alarm set Nurse Communication: Mobility status         Time: 1415-1430 PT Time Calculation (min): 15 min   Charges:   PT Evaluation $Initial PT Evaluation Tier I: 1 Procedure PT Treatments $Therapeutic Activity: 8-22 mins   PT G Codes:          Rolinda Roan 07/25/2014, 3:30 PM  Rolinda Roan, PT, DPT Acute Rehabilitation Services Pager: [phone]

## 2014-07-25 NOTE — Progress Notes (Signed)
PT Cancellation Note  Patient Details Name: [name] MRN: [id number] DOB: 1932-12-14   Cancelled Treatment:    Reason Eval/Treat Not Completed: Patient at procedure or test/unavailable. Pt with speech therapy present in room. Will check back as schedule allows for eval completion.    Rolinda Roan 07/25/2014, 10:44 AM  Rolinda Roan, PT, DPT Acute Rehabilitation Services Pager: [phone]

## 2014-07-25 NOTE — Progress Notes (Signed)
[facility] Rm 6E11 [facility] RN Vist Related admission to [facility] Dx: Chronic Respiratory Failure; Pt is a DNR Code status MOST Form and GOLD DNR Form in shadow chart Pt seen at bedsid ewith [facility] SW Wendi Snipes, SLP [place] working with pt; pt alert, able to converse however noted to tire very easily with treatments and conversation; Currently on a 15L NRB Mask RR =24-30 O2 sat 99% pt taken off NRB mask to [facility] for brief period while working with SLP; pt needed to be reminded to breathe through her nose and able to maintain O2 sat in mid 80s only for brief time - placed back on NRB Mask at end of visit [facility] SW and RN acknowledged that pt had previously noted in MOST Form for no hospitalizations and focus on comfort care; discussed pt's desire to remain at hospital for treatment or return to [facility]; pt quickly responded that she would want to return to [facility] Joint phone call made to pt's sister [name] ([phone]); Bethena Roys informed she and her husband are on their way traveling from [place] and will be at the hospital by 4:30p today; she recounted conversation with Dr Hartford Poli yesterday stating awareness that pt was having difficulty maintaining her oxygen level and was at risk for dying.  Bethena Roys is waiting to hear from attending physician regarding current status. Provided update to sister from this writer and [facility] SW on patient's condition during our visit. Bethena Roys affirmed pt has LW and once medically stable would wish transition back to [facility]; she is aware pt's swallowing status to be evaluated and Oxygen needs (possible attempt to wean off NRBM) to be addressed by primary physician. Writer to discuss above with Dr Tana Coast. Per earlier discussion with Dr Tana Coast Liquid Concentrated Morphine order has been initiated for SOB/pain. Patient's home medication list and transfer summary is on shadow chart. [facility] will continue to follow daily during  hospital stay; please contact [facility] [phone] with any hospice needs/concerns.   Danton Sewer, RN MSN [facility] Liaison [phone]

## 2014-07-25 NOTE — Evaluation (Signed)
Clinical/Bedside Swallow Evaluation Patient Details  Name: SHIGEKO MANARD MRN: [id number] Date of Birth: [birth year]  Today's Date: 07/25/2014 Time: 1036-1105 SLP Time Calculation (min): 29 min  Past Medical History:  Past Medical History  Diagnosis Date  . Bronchiectasis with acute exacerbation 05/16/14    acute respiratory failure   . Other and unspecified hyperlipidemia   . TIA (transient ischemic attack)   . Osteoporosis, unspecified   . Asthma   . Esophageal reflux   . IBS (irritable bowel syndrome)   . Diverticulosis   . Hemorrhoids   . Hypothyroidism   . Schatzki's ring   . Hiatal hernia   . Tubular adenoma of colon 12/2008  . Chronic respiratory failure 05/16/14  . HCAP (healthcare-associated pneumonia) 05/16/14  . Sinusitis   . GERD (gastroesophageal reflux disease)   . HTN (hypertension)   . Encephalopathy 05/16/14    noted during hospitalization  . Depressive disorder, not elsewhere classified   . Diastolic dysfunction, left ventricle 05/16/14  . Memory loss   . Pubic ramus fracture   . Hiatal hernia   . Tubular adenoma of colon   . Hypoxia   . Anemia, unspecified   . Constipation    Past Surgical History:  Past Surgical History  Procedure Laterality Date  . Cholecystectomy  1995  . Nasal sinus surgery    . Tonsillectomy    . Foot surgery    . 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 Pt is resident of [place] and was admitted after witnessed fall.  Sustained left forehead hematoma and left closed comminuted distal radial fracture.  Notes state that pt was on a dysphagia diet with thickened liquids at SNF, but recently transferred to hospice with comfort approach.  Pt has been desaturating and now on NRB.     Assessment / Plan / Recommendation Clinical Impression  Pt's oropharyngeal swallow  mechanism appears functional, however, it is her current respiratory needs and RR that pose the greatest risk of aspiration.  When effort to breathe is high, and RR exceeds 30, the ability to coordinate ventilation with swallowing can be compromised.   During today's study, with NRB in place and removed momentarily to receive bolus, pt consumed limited boluses of ice chips, water, and applesauce with no apparent discomfort, no overt signs of aspiration.  She verbalized pleasure from eating/drinking small amounts.  Given transition to hospice with focus on comfort, recommend the following: 1) vigilant oral care - to prevent rapid accumulation of thickened mucus due to mouth breathing; 2) encourage small sips/bites of food/liquid per pt preferences; 3) ensure adequate respirations between PO boluses; 4) hold tray if pt verbalizes/demonstrates discomfort. 4) allow dysphagia 3/thins to liberalize options.  No further SLP f/u is warranted at this point.  Discussed with Danton Sewer, Palliative care, and SW from [facility].      Aspiration Risk  Moderate    Diet Recommendation Thin liquid;Dysphagia 3 (Mechanical Soft)   Liquid Administration via: Cup Medication Administration: Whole meds with puree Supervision: Patient able to self feed;Staff to assist with self feeding Compensations: Slow rate;Small sips/bites    Other  Recommendations Oral Care Recommendations: Oral care BID   Follow Up Recommendations  None    Frequency and Duration            SLP Swallow Goals  Swallow Study Prior Functional Status       General HPI: 80 y.o. female, retired Secretary/administrator professor, with history of bronchiectasis, chronic hypoxic respiratory disorder on 4 L of oxygen, TIA and advanced dementia. Pt is resident of [place] and was admitted after witnessed fall.  Sustained left forehead hematoma and left closed comminuted distal radial fracture.  Notes state that pt was on a dysphagia diet with  thickened liquids at SNF, but recently transferred to hospice with comfort approach.  Pt has been desaturating and now on NRB.   Type of Study: Bedside swallow evaluation Previous Swallow Assessment: none per records Diet Prior to this Study: NPO Temperature Spikes Noted: No Respiratory Status: non-rebreather History of Recent Intubation: No Behavior/Cognition: Alert;Cooperative Oral Cavity - Dentition: Adequate natural dentition Self-Feeding Abilities: Able to feed self Patient Positioning: Upright in bed Baseline Vocal Quality: Clear Volitional Cough: Strong Volitional Swallow: Able to elicit    Oral/Motor/Sensory Function Overall Oral Motor/Sensory Function: Appears within functional limits for tasks assessed   Ice Chips Ice chips: Within functional limits Presentation: Spoon   Thin Liquid Thin Liquid: Within functional limits Presentation: Spoon;Cup;Self Fed    Nectar Thick Nectar Thick Liquid: Not tested   Honey Thick Honey Thick Liquid: Not tested   Puree Puree: Within functional limits Presentation: Spoon   Solid   GO    Solid: Not tested      Estill Bamberg L. Tivis Ringer, MA CCC/SLP Pager [phone]  Juan Quam Laurice 07/25/2014,11:25 AM

## 2014-07-25 NOTE — Progress Notes (Signed)
Following SLP recommendations, writer attempted to administer PO medications whole in apple sauce. Pt unable to swallow pills or capsules. Pt held pills in her mouth and states "I can't swallow them." Retrieved pills from pt's mouth ensuring pt's mouth was completely empty. Writer alerted Dr. Tana Coast of issue and per Dr. Tana Coast will hold all PO meds until pt able to swallow.

## 2014-07-26 DIAGNOSIS — W19XXXA Unspecified fall, initial encounter: Secondary | ICD-10-CM

## 2014-07-26 LAB — BASIC METABOLIC PANEL
ANION GAP: 12 (ref 5–15)
BUN: 30 mg/dL — ABNORMAL HIGH (ref 6–23)
CHLORIDE: 101 meq/L (ref 96–112)
CO2: 30 mEq/L (ref 19–32)
Calcium: 8.6 mg/dL (ref 8.4–10.5)
Creatinine, Ser: 0.72 mg/dL (ref 0.50–1.10)
GFR calc non Af Amer: 80 mL/min — ABNORMAL LOW (ref >90)
Glucose, Bld: 150 mg/dL — ABNORMAL HIGH (ref 70–99)
POTASSIUM: 4.5 meq/L (ref 3.7–5.3)
SODIUM: 143 meq/L (ref 137–147)

## 2014-07-26 LAB — CBC
HEMATOCRIT: 35.7 % — AB (ref 36.0–46.0)
Hemoglobin: 10.9 g/dL — ABNORMAL LOW (ref 12.0–15.0)
MCH: 24.7 pg — ABNORMAL LOW (ref 26.0–34.0)
MCHC: 30.5 g/dL (ref 30.0–36.0)
MCV: 81 fL (ref 78.0–100.0)
Platelets: 358 10*3/uL (ref 150–400)
RBC: 4.41 MIL/uL (ref 3.87–5.11)
RDW: 16.2 % — AB (ref 11.5–15.5)
WBC: 9.1 10*3/uL (ref 4.0–10.5)

## 2014-07-26 LAB — URINE CULTURE
COLONY COUNT: NO GROWTH
CULTURE: NO GROWTH
Colony Count: >=100000

## 2014-07-26 MED ORDER — IPRATROPIUM BROMIDE 0.02 % IN SOLN
0.5000 mg | Freq: Three times a day (TID) | RESPIRATORY_TRACT | Status: DC
  Administered 2014-07-26 – 2014-07-27 (×2): 0.5 mg via RESPIRATORY_TRACT
  Filled 2014-07-26 (×2): qty 2.5

## 2014-07-26 MED ORDER — FAMOTIDINE IN NACL 20-0.9 MG/50ML-% IV SOLN
20.0000 mg | INTRAVENOUS | Status: DC
  Administered 2014-07-26: 20 mg via INTRAVENOUS
  Filled 2014-07-26: qty 50

## 2014-07-26 MED ORDER — LEVALBUTEROL HCL 0.63 MG/3ML IN NEBU
0.6300 mg | INHALATION_SOLUTION | Freq: Three times a day (TID) | RESPIRATORY_TRACT | Status: DC
  Administered 2014-07-26 – 2014-07-27 (×2): 0.63 mg via RESPIRATORY_TRACT
  Filled 2014-07-26 (×5): qty 3

## 2014-07-26 MED ORDER — LORAZEPAM 1 MG PO TABS
1.0000 mg | ORAL_TABLET | Freq: Two times a day (BID) | ORAL | Status: DC
  Administered 2014-07-26 – 2014-07-27 (×3): 1 mg via ORAL
  Filled 2014-07-26 (×3): qty 1

## 2014-07-26 MED ORDER — LORAZEPAM 2 MG/ML PO CONC: 1.0000 mg | Freq: Two times a day (BID) | ORAL | Status: DC

## 2014-07-26 MED ORDER — LORAZEPAM 2 MG/ML IJ SOLN: 1.0000 mg | INTRAMUSCULAR | Status: DC | PRN

## 2014-07-26 NOTE — Progress Notes (Signed)
DNR.  Related admission.  Patient resting peacefully in bed. No signs of pain noted. No family present at bedside.  Patient to transfer to [facility] on Sunday for EOL care.  Please contact [place] with any questions or concerns.  Vance Gather, RN [facility]

## 2014-07-26 NOTE — Clinical Social Work Note (Addendum)
CSW made aware by MD, patient has been made comfort care and residential hospice has been recommended. Patient is currently under Hospice and [place]. CSW  Contacted [facility] and spoke with [place] [phone]). Patient is to be placed at [facility] once bed available. CSW attempted to contact patient's sister. CSW to follow-up with sister and [facility]. MD made aware.    [place], [place] Weekend Clinical Social Worker [phone]

## 2014-07-26 NOTE — Progress Notes (Signed)
PATIENT DETAILS Name: [name] Age: 80 y.o. Sex: female Date of Birth: June 29, 1933 Admit Date: 07/24/2014 Admitting Physician Theressa Millard, MD [id number] Melvyn Novas, MD  Subjective: Awake, seems comfortable.  Assessment/Plan: Principal Problem:   UTI (lower urinary tract infection): Urine culture positive for Escherichia coli, sensitivities pending. Afebrile. Transition to comfort care, stop all antibiotics. Active Problems:   Acute on chronic hypoxic respiratory failure: Very poor prognosis, on 100% FiO2.Suspect would best benefit from residential hospice placement.Spoke with sister Bethena Roys [phone]-explained poor prognosis, agreeable to transition to comfort care, will ask social work to see if bed available in residential hospice.   Bronchiectasis with acute exacerbation: Likely causing above. She has end-stage lung disease. Continue supportive care with nebs only, stopping all IV Abx, IV Solumedrol-as we are transitioning to comfort care. Suspect would best benefit from residential hospice placement.   Hypothyroidism:stop Levothyroxine-transitioned to comfort care   Fall:fall precautions   Dementia:at baseline.   Closed left radial fracture:supporitve care. Prn Morphine.   Palliative Care: Already DO NOT RESUSCITATE, followed by hospice at SNF. Now requiring 100% nonrebreather mask, very frail, having trouble with dysphagia now. Spoke with sister-she is agreeable to transition to comfort care. Best benefit from residential hospice placement on discharge. Social worker consultation place.   Disposition: Remain inpatient  Antibiotics:  IV Rocephin 10/29>>10/31  DVT Prophylaxis: None -comfort care  Code Status: DNR  Family Communication  sister Bethena Roys [phone]  Procedures:  None  CONSULTS:  None  Time spent 40 minutes-which includes 50% of the time with face-to-face with patient/ family and coordinating care related to the above  assessment and plan.    MEDICATIONS: Scheduled Meds: . acidophilus  1 capsule Oral Daily  . arformoterol  15 mcg Nebulization Q12H  . aspirin EC  81 mg Oral Daily  . bisoprolol  2.5 mg Oral q morning - 10a  . budesonide  0.5 mg Nebulization BID  . calcium-vitamin D  1 tablet Oral BID  . cefTRIAXone (ROCEPHIN)  IV  1 g Intravenous Q24H  . clopidogrel  75 mg Oral q morning - 10a  . famotidine (PEPCID) IV  20 mg Intravenous Q24H  . feeding supplement (RESOURCE BREEZE)  1 Container Oral TID BM  . fluticasone  2 spray Each Nare q morning - 10a  . guaiFENesin  1,200 mg Oral BID  . heparin  5,000 Units Subcutaneous 3 times per day  . levalbuterol  0.63 mg Nebulization QID   And  . ipratropium  0.5 mg Nebulization QID  . latanoprost  1 drop Both Eyes QHS  . levothyroxine  150 mcg Oral QAC breakfast  . methylPREDNISolone (SOLU-MEDROL) injection  60 mg Intravenous Q12H  . nisoldipine  8.5 mg Oral q morning - 10a  . polyethylene glycol  17 g Oral BID  . saccharomyces boulardii  250 mg Oral BID  . senna-docusate  2 tablet Oral BID  . sodium chloride  3 mL Intravenous Q12H   Continuous Infusions: . sodium chloride     PRN Meds:.acetaminophen, acetaminophen, ALPRAZolam, HYDROcodone-acetaminophen, morphine injection, morphine CONCENTRATE, ondansetron (ZOFRAN) IV, ondansetron, traMADol  Antibiotics: Anti-infectives   Start     Dose/Rate Route Frequency Ordered Stop   07/25/14 0800  cefTRIAXone (ROCEPHIN) 1 g in dextrose 5 % 50 mL IVPB     1 g 100 mL/hr over 30 Minutes Intravenous Every 24 hours 07/24/14 1112     07/24/14 0415  cefTRIAXone (ROCEPHIN) 1 g in dextrose 5 %  50 mL IVPB     1 g 100 mL/hr over 30 Minutes Intravenous  Once 07/24/14 0407 07/24/14 0541       PHYSICAL EXAM: Vital signs in last 24 hours: Filed Vitals:   07/25/14 2059 07/25/14 2100 07/26/14 0500 07/26/14 0848  BP:   181/90   Pulse:  82 87 89  Temp:   97.7 F (36.5 C)   TempSrc:   Axillary   Resp:  17 18  19   Height:      Weight:      SpO2: 100%  100% 100%    Weight change: 1.3 kg (2 lb 13.9 oz) Filed Weights   07/24/14 0259 07/24/14 1218 07/25/14 2036  Weight: 52.164 kg (115 lb) 46.9 kg (103 lb 6.3 oz) 48.2 kg (106 lb 4.2 oz)   Body mass index is 19.43 kg/(m^2).   Gen Exam: Awake and alert, on 100 % NRB  Neck: Supple,  Chest: Decreased air entry bilaterally, some scattered rhonchi CVS: S1 S2 Regular, no murmurs.  Abdomen: soft, BS +, non tender, non distended.  Extremities: no edema, lower extremities warm to touch. Neurologic: Non Focal.   Skin: No Rash.     Intake/Output from previous day:  Intake/Output Summary (Last 24 hours) at 07/26/14 1008 Last data filed at 07/26/14 0312  Gross per 24 hour  Intake     50 ml  Output    500 ml  Net   -450 ml     LAB RESULTS: CBC  Recent Labs Lab 07/22/14 07/24/14 0506 07/25/14 0455 07/26/14 0617  WBC 15.8 15.4* 14.6* 9.1  HGB 11.2* 10.3* 10.8* 10.9*  HCT 35* 32.9* 35.3* 35.7*  PLT 373 363 325 358  MCV  --  79.3 79.9 81.0  MCH  --  24.8* 24.4* 24.7*  MCHC  --  31.3 30.6 30.5  RDW  --  16.3* 16.7* 16.2*  LYMPHSABS  --  2.0  --   --   MONOABS  --  1.7*  --   --   EOSABS  --  0.2  --   --   BASOSABS  --  0.0  --   --     Chemistries   Recent Labs Lab 07/22/14 07/24/14 0506 07/25/14 0455 07/26/14 0617  NA 132* 138 140 143  K 4.2 4.6 3.6* 4.5  CL  --  98 98 101  CO2  --  31 29 30   GLUCOSE  --  103* 108* 150*  BUN 18 19 20  30*  CREATININE 0.8 0.77 0.74 0.72  CALCIUM  --  8.8 8.2* 8.6    CBG: No results found for this basename: GLUCAP,  in the last 168 hours  GFR Estimated Creatinine Clearance: 44.1 ml/min (by C-G formula based on Cr of 0.72).  Coagulation profile No results found for this basename: INR, PROTIME,  in the last 168 hours  Cardiac Enzymes No results found for this basename: CK, CKMB, TROPONINI, MYOGLOBIN,  in the last 168 hours  No components found with this basename: POCBNP,  No  results found for this basename: DDIMER,  in the last 72 hours No results found for this basename: HGBA1C,  in the last 72 hours No results found for this basename: CHOL, HDL, LDLCALC, TRIG, CHOLHDL, LDLDIRECT,  in the last 72 hours  Recent Labs  07/24/14 1200  TSH 4.810*   No results found for this basename: VITAMINB12, FOLATE, FERRITIN, TIBC, IRON, RETICCTPCT,  in the last 72 hours No results found for this  basename: LIPASE, AMYLASE,  in the last 72 hours  Urine Studies No results found for this basename: UACOL, UAPR, USPG, UPH, UTP, UGL, UKET, UBIL, UHGB, UNIT, UROB, ULEU, UEPI, UWBC, URBC, UBAC, CAST, CRYS, UCOM, BILUA,  in the last 72 hours  MICROBIOLOGY: Recent Results (from the past 240 hour(s))  URINE CULTURE     Status: None   Collection Time    07/24/14  3:15 AM      Result Value Ref Range Status   Specimen Description URINE, CATHETERIZED   Final   Special Requests NONE   Final   Culture  Setup Time     Final   Value: 07/24/2014 10:40     Performed at [facility]     Final   Value: >=100,000 COLONIES/ML     Performed at [facility]   Culture     Final   Value: ESCHERICHIA COLI     Performed at [facility]   Report Status PENDING   Incomplete  MRSA PCR SCREENING     Status: None   Collection Time    07/24/14  9:19 AM      Result Value Ref Range Status   MRSA by PCR NEGATIVE  NEGATIVE Final   Comment:            The GeneXpert MRSA Assay (FDA     approved for NASAL specimens     only), is one component of a     comprehensive MRSA colonization     surveillance program. It is not     intended to diagnose MRSA     infection nor to guide or     monitor treatment for     MRSA infections.  URINE CULTURE     Status: None   Collection Time    07/24/14  3:06 PM      Result Value Ref Range Status   Specimen Description URINE, CATHETERIZED   Final   Special Requests NONE   Final   Culture  Setup Time     Final   Value:  07/24/2014 23:07     Performed at [place]     Final   Value: NO GROWTH     Performed at [facility]   Culture     Final   Value: NO GROWTH     Performed at [facility]   Report Status 07/26/2014 FINAL   Final    RADIOLOGY STUDIES/RESULTS: Dg Chest 2 View  07/24/2014   CLINICAL DATA:  Fall  EXAM: CHEST  2 VIEW  COMPARISON:  05/27/2014  FINDINGS: Heart size and mediastinal contours within normal range. Diffuse reticulonodular opacities. Lower lobe bronchiectasis. Similar left lung base opacity. No overt pleural effusion or pneumothorax. Diffuse osteopenia. No definite acute osseous finding.  IMPRESSION: Similar reticulonodular interstitial prominence and lower lobe bronchiectasis.  More confluent left greater than right lung base opacities may reflect atelectasis or infiltrate.   Electronically Signed   By: Carlos Levering M.D.   On: 07/24/2014 04:08   Dg Elbow Complete Left  07/24/2014   CLINICAL DATA:  Fall  EXAM: LEFT ELBOW - COMPLETE 3+ VIEW  COMPARISON:  None.  FINDINGS: Four views of the left elbow show no displaced fracture or dislocation. No aggressive osseous lesion. No overt joint effusion.  IMPRESSION: No acute or aggressive osseous finding of the left elbow.  If clinical concern for an acute fracture persists, recommend  a repeat radiograph in 7-10 days to evaluate for interval change or callus formation.   Electronically Signed   By: Carlos Levering M.D.   On: 07/24/2014 04:05   Dg Wrist Complete Left  07/24/2014   CLINICAL DATA:  Fall, left wrist bruising and deformity.  EXAM: LEFT WRIST - COMPLETE 3+ VIEW  COMPARISON:  Contemporaneous elbow radiographs  FINDINGS: There is an impacted and comminuted fracture of the distal left radius with mild dorsal displacement and angulation. There is intra-articular extension into the radiocarpal joint and into the distal radial ulnar joint as well. Slight ulnar positive variance. Diffuse  osteopenia. Atherosclerotic vascular calcifications. Soft tissue swelling.  IMPRESSION: Comminuted impaction fracture of the distal left radius with dorsal angulation and intra-articular extension.  Ulnar positive variance.   Electronically Signed   By: Carlos Levering M.D.   On: 07/24/2014 04:11   Ct Head Wo Contrast  07/24/2014   CLINICAL DATA:  Left forehead hematoma status post fall.  EXAM: CT HEAD WITHOUT CONTRAST  CT CERVICAL SPINE WITHOUT CONTRAST  TECHNIQUE: Multidetector CT imaging of the head and cervical spine was performed following the standard protocol without intravenous contrast. Multiplanar CT image reconstructions of the cervical spine were also generated.  COMPARISON:  04/29/2012  FINDINGS: CT HEAD FINDINGS  Moderate subcortical and periventricular white matter hypodensities, a nonspecific finding often seen in the setting of chronic microangiopathic change. Bilateral basal ganglia rounded hypodensities, favored to reflect remote lacunar infarctions. No definite CT evidence for acute cortical based (large artery) infarction. No intraparenchymal hemorrhage, mass, mass effect, or abnormal extra-axial fluid collection. Mild cerebral volume loss. Ventricular prominence is mildly disproportionate to volume loss elsewhere. Atherosclerotic vascular calcifications. Left frontal scalp hematoma. Evidence of chronic sinusitis with wall thickening of the maxillary sinuses. Opacified sphenoid chambers and partially opacified ethmoid air cells and frontal sinus. Right maxillary sinus mucosal thickening. Partially opacified mastoid air cells. No displaced calvarial fracture.  CT CERVICAL SPINE FINDINGS  Lung apices show ground-glass opacities and multifocal impacted bronchioles. Atherosclerotic vascular calcifications. Maintained craniocervical relationship. Similar impaction of the dens and clivus with osseous remodeling. Multilevel degenerative changes, most pronounced at C4-5. Multifocal lucencies are  similar to prior and may be degenerative or secondary to osteopenia. Mild anterolisthesis of C6 on C7. No displaced acute fracture or dislocation.  IMPRESSION: Left frontal scalp hematoma.  No underlying calvarial fracture.  Ventriculomegaly, similar to prior. May reflect central cerebral volume loss or normal pressure hydrocephalus.  White matter changes are similar to prior, a nonspecific finding most often seen in the setting of chronic microangiopathic change.  Extensive paranasal sinus disease as above. May reflect acute on chronic sinusitis in the appropriate clinical setting.  Bilateral mastoid effusions.  Multilevel degenerative changes of the cervical spine. No acute osseous finding.  Lung apices show ground-glass opacities and impacted bronchioles,, similar to prior.   Electronically Signed   By: Carlos Levering M.D.   On: 07/24/2014 04:43   Ct Cervical Spine Wo Contrast  07/24/2014   CLINICAL DATA:  Left forehead hematoma status post fall.  EXAM: CT HEAD WITHOUT CONTRAST  CT CERVICAL SPINE WITHOUT CONTRAST  TECHNIQUE: Multidetector CT imaging of the head and cervical spine was performed following the standard protocol without intravenous contrast. Multiplanar CT image reconstructions of the cervical spine were also generated.  COMPARISON:  04/29/2012  FINDINGS: CT HEAD FINDINGS  Moderate subcortical and periventricular white matter hypodensities, a nonspecific finding often seen in the setting of chronic microangiopathic change. Bilateral basal ganglia rounded  hypodensities, favored to reflect remote lacunar infarctions. No definite CT evidence for acute cortical based (large artery) infarction. No intraparenchymal hemorrhage, mass, mass effect, or abnormal extra-axial fluid collection. Mild cerebral volume loss. Ventricular prominence is mildly disproportionate to volume loss elsewhere. Atherosclerotic vascular calcifications. Left frontal scalp hematoma. Evidence of chronic sinusitis with wall  thickening of the maxillary sinuses. Opacified sphenoid chambers and partially opacified ethmoid air cells and frontal sinus. Right maxillary sinus mucosal thickening. Partially opacified mastoid air cells. No displaced calvarial fracture.  CT CERVICAL SPINE FINDINGS  Lung apices show ground-glass opacities and multifocal impacted bronchioles. Atherosclerotic vascular calcifications. Maintained craniocervical relationship. Similar impaction of the dens and clivus with osseous remodeling. Multilevel degenerative changes, most pronounced at C4-5. Multifocal lucencies are similar to prior and may be degenerative or secondary to osteopenia. Mild anterolisthesis of C6 on C7. No displaced acute fracture or dislocation.  IMPRESSION: Left frontal scalp hematoma.  No underlying calvarial fracture.  Ventriculomegaly, similar to prior. May reflect central cerebral volume loss or normal pressure hydrocephalus.  White matter changes are similar to prior, a nonspecific finding most often seen in the setting of chronic microangiopathic change.  Extensive paranasal sinus disease as above. May reflect acute on chronic sinusitis in the appropriate clinical setting.  Bilateral mastoid effusions.  Multilevel degenerative changes of the cervical spine. No acute osseous finding.  Lung apices show ground-glass opacities and impacted bronchioles,, similar to prior.   Electronically Signed   By: Carlos Levering M.D.   On: 07/24/2014 04:43   Dg Chest Portable 1 View  07/24/2014   CLINICAL DATA:  Shortness of breath.  Hypoxia.  EXAM: PORTABLE CHEST - 1 VIEW  COMPARISON:  Chest x-ray 07/24/2014,05/02/2014,08/07/2013,05/01/2012. CT chest 08/13/2013.  FINDINGS: Mediastinum hilar structures normal. Heart size normal. Pulmonary vascularity normal. Diffuse unchanged chronic reticular nodular interstitial infiltrates with bibasilar bronchiectasis again noted. No significant pleural effusion. No pneumothorax. Old right proximal humeral fracture.   IMPRESSION: Chronic bilateral reticular nodular interstitial infiltrates with bibasilar bronchiectasis and atelectasis. 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 No change from multiple prior exams.   Electronically Signed   By: Marcello Moores  Register   On: 07/24/2014 08:14    Oren Binet, MD  [facility] Pager:[phone]  If 7PM-7AM, please contact night-coverage [email or web address] Password [email or web address] 07/26/2014, 10:08 AM   LOS: 2 days

## 2014-07-26 NOTE — Progress Notes (Signed)
Pt unable to tolerate PO meds. MD notified Jonette Eva). Will continue to monitor patient.

## 2014-07-26 NOTE — Clinical Social Work Note (Signed)
CSW contacted by [facility]) with bed offer for patient for Sunday 07/27/14 admit. CSW contacted patient's sister and made her aware of the above. Sister is agreeable to d/c plan and requests she be contacted prior to contacting ambulance transportation in order to meet patient at [facility]. CSW verbalized understanding. CSW to make RN and MD aware of the above. CSW to follow tomorrow with d/c to [facility].   [place], Odin Weekend Clinical Social Worker [phone]

## 2014-07-27 MED ORDER — TRAMADOL 5 MG/ML ORAL SUSPENSION: 25.0000 mg | Freq: Four times a day (QID) | ORAL | Status: DC | PRN

## 2014-07-27 MED ORDER — ALPRAZOLAM 0.25 MG PO TABS: 0.2500 mg | ORAL_TABLET | Freq: Four times a day (QID) | ORAL | Status: AC | PRN

## 2014-07-27 MED ORDER — MORPHINE SULFATE (CONCENTRATE) 10 MG /0.5 ML PO SOLN: 5.0000 mg | ORAL | Status: AC | PRN

## 2014-07-27 NOTE — Progress Notes (Deleted)
PATIENT DETAILS Name: [name] Age: 80 y.o. Sex: female Date of Birth: March 29, 1933 Admit Date: 07/24/2014 Admitting Physician Theressa Millard, MD PTW:[id number] Melvyn Novas, MD  Subjective: Awake, seems comfortable.  Assessment/Plan: Principal Problem:   UTI (lower urinary tract infection): off antibiotics as she is currently comfort care. Active Problems:   Acute on chronic hypoxic respiratory failure: Very poor prognosis, on 100% FiO2.Suspect would best benefit from residential hospice placement. Dr. Claudean Severance with sister Bethena Roys Siegfried-[phone]-explained poor prognosis, agreeable to transition to comfort care, social worker arranging for [employer] admission.   Bronchiectasis with acute exacerbation: Likely causing above. She has end-stage lung disease. Continue supportive care with nebs only, stopping all IV Abx, IV Solumedrol-as we are transitioning to comfort care. Suspect would best benefit from residential hospice placement.   Hypothyroidism:stop Levothyroxine-transitioned to comfort care   Fall:fall precautions   Dementia:at baseline.   Closed left radial fracture:supporitve care. Prn Morphine.   Palliative Care: Already DO NOT RESUSCITATE, followed by hospice at SNF. Now requiring 100% nonrebreather mask, very frail, having trouble with dysphagia now. sister is agreeable to transition to comfort care. Best benefit from residential hospice placement on discharge. Social worker following   Disposition: Remain inpatient  Antibiotics:  IV Rocephin 10/29>>10/31  DVT Prophylaxis: None -comfort care  Code Status: DNR  Family Communication  sister Bethena Roys [phone]  Procedures:  None  CONSULTS:  None  Time spent 25 minutes     MEDICATIONS: Scheduled Meds: . arformoterol  15 mcg Nebulization Q12H  . budesonide  0.5 mg Nebulization BID  . calcium-vitamin D  1 tablet Oral BID  . fluticasone  2 spray Each Nare q morning - 10a  .  levalbuterol  0.63 mg Nebulization TID   And  . ipratropium  0.5 mg Nebulization TID  . latanoprost  1 drop Both Eyes QHS  . LORazepam  1 mg Oral BID  . polyethylene glycol  17 g Oral BID   Continuous Infusions:   PRN Meds:.acetaminophen **OR** acetaminophen, LORazepam, morphine injection, morphine CONCENTRATE, ondansetron **OR** ondansetron (ZOFRAN) IV  Antibiotics: Anti-infectives    Start     Dose/Rate Route Frequency Ordered Stop   07/25/14 0800  cefTRIAXone (ROCEPHIN) 1 g in dextrose 5 % 50 mL IVPB  Status:  Discontinued     1 g100 mL/hr over 30 Minutes Intravenous Every 24 hours 07/24/14 1112 07/26/14 1113   07/24/14 0415  cefTRIAXone (ROCEPHIN) 1 g in dextrose 5 % 50 mL IVPB     1 g100 mL/hr over 30 Minutes Intravenous  Once 07/24/14 0407 07/24/14 0541       PHYSICAL EXAM: Vital signs in last 24 hours: Filed Vitals:   07/26/14 0900 07/26/14 1202 07/26/14 1530 07/27/14 0949  BP: 152/85  154/70 150/76  Pulse: 88  99 98  Temp: 97.7 F (36.5 C)  97.6 F (36.4 C) 98 F (36.7 C)  TempSrc: Oral  Oral Oral  Resp: 21  20 20   Height:      Weight:      SpO2: 100% 100% 100% 100%    Weight change:  Filed Weights   07/24/14 0259 07/24/14 1218 07/25/14 2036  Weight: 52.164 kg (115 lb) 46.9 kg (103 lb 6.3 oz) 48.2 kg (106 lb 4.2 oz)   Body mass index is 19.43 kg/(m^2).   Gen Exam: Awake and alert, on 100 % NRB  Neck: Supple,  Chest: Decreased air entry bilaterally, some scattered rhonchi CVS: S1 S2 Regular,  no murmurs.  Abdomen: soft, BS +, non tender, non distended.  Extremities: no edema, lower extremities warm to touch. Neurologic: Non Focal.   Skin: No Rash.     Intake/Output from previous day:  Intake/Output Summary (Last 24 hours) at 07/27/14 1043 Last data filed at 07/27/14 0900  Gross per 24 hour  Intake    360 ml  Output    350 ml  Net     10 ml     LAB RESULTS: CBC  Recent Labs Lab 07/22/14 07/24/14 0506 07/25/14 0455 07/26/14 0617  WBC 15.8  15.4* 14.6* 9.1  HGB 11.2* 10.3* 10.8* 10.9*  HCT 35* 32.9* 35.3* 35.7*  PLT 373 363 325 358  MCV  --  79.3 79.9 81.0  MCH  --  24.8* 24.4* 24.7*  MCHC  --  31.3 30.6 30.5  RDW  --  16.3* 16.7* 16.2*  LYMPHSABS  --  2.0  --   --   MONOABS  --  1.7*  --   --   EOSABS  --  0.2  --   --   BASOSABS  --  0.0  --   --     Chemistries   Recent Labs Lab 07/22/14 07/24/14 0506 07/25/14 0455 07/26/14 0617  NA 132* 138 140 143  K 4.2 4.6 3.6* 4.5  CL  --  98 98 101  CO2  --  31 29 30   GLUCOSE  --  103* 108* 150*  BUN 18 19 20  30*  CREATININE 0.8 0.77 0.74 0.72  CALCIUM  --  8.8 8.2* 8.6    CBG: No results for input(s): GLUCAP in the last 168 hours.  GFR Estimated Creatinine Clearance: 44.1 mL/min (by C-G formula based on Cr of 0.72).  Coagulation profile No results for input(s): INR, PROTIME in the last 168 hours.  Cardiac Enzymes No results for input(s): CKMB, TROPONINI, MYOGLOBIN in the last 168 hours.  Invalid input(s): CK  Invalid input(s): POCBNP No results for input(s): DDIMER in the last 72 hours. No results for input(s): HGBA1C in the last 72 hours. No results for input(s): CHOL, HDL, LDLCALC, TRIG, CHOLHDL, LDLDIRECT in the last 72 hours.  Recent Labs  07/24/14 1200  TSH 4.810*   No results for input(s): VITAMINB12, FOLATE, FERRITIN, TIBC, IRON, RETICCTPCT in the last 72 hours. No results for input(s): LIPASE, AMYLASE in the last 72 hours.  Urine Studies No results for input(s): UHGB, CRYS in the last 72 hours.  Invalid input(s): UACOL, UAPR, USPG, UPH, UTP, UGL, UKET, UBIL, UNIT, UROB, ULEU, UEPI, UWBC, URBC, UBAC, CAST, UCOM, BILUA  MICROBIOLOGY: Recent Results (from the past 240 hour(s))  Urine culture     Status: None   Collection Time: 07/24/14  3:15 AM  Result Value Ref Range Status   Specimen Description URINE, CATHETERIZED  Final   Special Requests NONE  Final   Culture  Setup Time   Final    07/24/2014 10:40 Performed at [facility]   Final    >=100,000 COLONIES/ML Performed at [place] Performed at [facility]   Report Status 07/26/2014 FINAL  Final   Organism ID, Bacteria ESCHERICHIA COLI  Final      Susceptibility   Escherichia coli - MIC*    AMPICILLIN >=32 RESISTANT Resistant     CEFAZOLIN <=4 SENSITIVE Sensitive     CEFTRIAXONE <=1 SENSITIVE Sensitive  CIPROFLOXACIN >=4 RESISTANT Resistant     GENTAMICIN <=1 SENSITIVE Sensitive     LEVOFLOXACIN >=8 RESISTANT Resistant     NITROFURANTOIN <=16 SENSITIVE Sensitive     TOBRAMYCIN <=1 SENSITIVE Sensitive     TRIMETH/SULFA <=20 SENSITIVE Sensitive     PIP/TAZO <=4 SENSITIVE Sensitive     * ESCHERICHIA COLI  MRSA PCR Screening     Status: None   Collection Time: 07/24/14  9:19 AM  Result Value Ref Range Status   MRSA by PCR NEGATIVE NEGATIVE Final    Comment:        The GeneXpert MRSA Assay (FDA approved for NASAL specimens only), is one component of a comprehensive MRSA colonization surveillance program. It is not intended to diagnose MRSA infection nor to guide or monitor treatment for MRSA infections.  Urine culture     Status: None   Collection Time: 07/24/14  3:06 PM  Result Value Ref Range Status   Specimen Description URINE, CATHETERIZED  Final   Special Requests NONE  Final   Culture  Setup Time   Final    07/24/2014 23:07 Performed at [facility] Performed at [facility]  Final   Culture NO GROWTH Performed at [facility]  Final   Report Status 07/26/2014 FINAL  Final    RADIOLOGY STUDIES/RESULTS: Dg Chest 2 View  07/24/2014   CLINICAL DATA:  Fall  EXAM: CHEST  2 VIEW  COMPARISON:  05/27/2014  FINDINGS: Heart size and mediastinal contours within normal range. Diffuse reticulonodular opacities. Lower lobe bronchiectasis. Similar left lung base opacity. No overt pleural effusion or pneumothorax.  Diffuse osteopenia. No definite acute osseous finding.  IMPRESSION: Similar reticulonodular interstitial prominence and lower lobe bronchiectasis.  More confluent left greater than right lung base opacities may reflect atelectasis or infiltrate.   Electronically Signed   By: Carlos Levering M.D.   On: 07/24/2014 04:08   Dg Elbow Complete Left  07/24/2014   CLINICAL DATA:  Fall  EXAM: LEFT ELBOW - COMPLETE 3+ VIEW  COMPARISON:  None.  FINDINGS: Four views of the left elbow show no displaced fracture or dislocation. No aggressive osseous lesion. No overt joint effusion.  IMPRESSION: No acute or aggressive osseous finding of the left elbow.  If clinical concern for an acute fracture persists, recommend a repeat radiograph in 7-10 days to evaluate for interval change or callus formation.   Electronically Signed   By: Carlos Levering M.D.   On: 07/24/2014 04:05   Dg Wrist Complete Left  07/24/2014   CLINICAL DATA:  Fall, left wrist bruising and deformity.  EXAM: LEFT WRIST - COMPLETE 3+ VIEW  COMPARISON:  Contemporaneous elbow radiographs  FINDINGS: There is an impacted and comminuted fracture of the distal left radius with mild dorsal displacement and angulation. There is intra-articular extension into the radiocarpal joint and into the distal radial ulnar joint as well. Slight ulnar positive variance. Diffuse osteopenia. Atherosclerotic vascular calcifications. Soft tissue swelling.  IMPRESSION: Comminuted impaction fracture of the distal left radius with dorsal angulation and intra-articular extension.  Ulnar positive variance.   Electronically Signed   By: Carlos Levering M.D.   On: 07/24/2014 04:11   Ct Head Wo Contrast  07/24/2014   CLINICAL DATA:  Left forehead hematoma status post fall.  EXAM: CT HEAD WITHOUT CONTRAST  CT CERVICAL SPINE WITHOUT CONTRAST  TECHNIQUE: Multidetector CT imaging of the head and cervical spine was performed following the standard protocol without intravenous  contrast.  Multiplanar CT image reconstructions of the cervical spine were also generated.  COMPARISON:  04/29/2012  FINDINGS: CT HEAD FINDINGS  Moderate subcortical and periventricular white matter hypodensities, a nonspecific finding often seen in the setting of chronic microangiopathic change. Bilateral basal ganglia rounded hypodensities, favored to reflect remote lacunar infarctions. No definite CT evidence for acute cortical based (large artery) infarction. No intraparenchymal hemorrhage, mass, mass effect, or abnormal extra-axial fluid collection. Mild cerebral volume loss. Ventricular prominence is mildly disproportionate to volume loss elsewhere. Atherosclerotic vascular calcifications. Left frontal scalp hematoma. Evidence of chronic sinusitis with wall thickening of the maxillary sinuses. Opacified sphenoid chambers and partially opacified ethmoid air cells and frontal sinus. Right maxillary sinus mucosal thickening. Partially opacified mastoid air cells. No displaced calvarial fracture.  CT CERVICAL SPINE FINDINGS  Lung apices show ground-glass opacities and multifocal impacted bronchioles. Atherosclerotic vascular calcifications. Maintained craniocervical relationship. Similar impaction of the dens and clivus with osseous remodeling. Multilevel degenerative changes, most pronounced at C4-5. Multifocal lucencies are similar to prior and may be degenerative or secondary to osteopenia. Mild anterolisthesis of C6 on C7. No displaced acute fracture or dislocation.  IMPRESSION: Left frontal scalp hematoma.  No underlying calvarial fracture.  Ventriculomegaly, similar to prior. May reflect central cerebral volume loss or normal pressure hydrocephalus.  White matter changes are similar to prior, a nonspecific finding most often seen in the setting of chronic microangiopathic change.  Extensive paranasal sinus disease as above. May reflect acute on chronic sinusitis in the appropriate clinical setting.  Bilateral mastoid  effusions.  Multilevel degenerative changes of the cervical spine. No acute osseous finding.  Lung apices show ground-glass opacities and impacted bronchioles,, similar to prior.   Electronically Signed   By: Carlos Levering M.D.   On: 07/24/2014 04:43   Ct Cervical Spine Wo Contrast  07/24/2014   CLINICAL DATA:  Left forehead hematoma status post fall.  EXAM: CT HEAD WITHOUT CONTRAST  CT CERVICAL SPINE WITHOUT CONTRAST  TECHNIQUE: Multidetector CT imaging of the head and cervical spine was performed following the standard protocol without intravenous contrast. Multiplanar CT image reconstructions of the cervical spine were also generated.  COMPARISON:  04/29/2012  FINDINGS: CT HEAD FINDINGS  Moderate subcortical and periventricular white matter hypodensities, a nonspecific finding often seen in the setting of chronic microangiopathic change. Bilateral basal ganglia rounded hypodensities, favored to reflect remote lacunar infarctions. No definite CT evidence for acute cortical based (large artery) infarction. No intraparenchymal hemorrhage, mass, mass effect, or abnormal extra-axial fluid collection. Mild cerebral volume loss. Ventricular prominence is mildly disproportionate to volume loss elsewhere. Atherosclerotic vascular calcifications. Left frontal scalp hematoma. Evidence of chronic sinusitis with wall thickening of the maxillary sinuses. Opacified sphenoid chambers and partially opacified ethmoid air cells and frontal sinus. Right maxillary sinus mucosal thickening. Partially opacified mastoid air cells. No displaced calvarial fracture.  CT CERVICAL SPINE FINDINGS  Lung apices show ground-glass opacities and multifocal impacted bronchioles. Atherosclerotic vascular calcifications. Maintained craniocervical relationship. Similar impaction of the dens and clivus with osseous remodeling. Multilevel degenerative changes, most pronounced at C4-5. Multifocal lucencies are similar to prior and may be  degenerative or secondary to osteopenia. Mild anterolisthesis of C6 on C7. No displaced acute fracture or dislocation.  IMPRESSION: Left frontal scalp hematoma.  No underlying calvarial fracture.  Ventriculomegaly, similar to prior. May reflect central cerebral volume loss or normal pressure hydrocephalus.  White matter changes are similar to prior, a nonspecific finding most often seen in the setting of chronic microangiopathic change.  Extensive paranasal sinus disease as above. May reflect acute on chronic sinusitis in the appropriate clinical setting.  Bilateral mastoid effusions.  Multilevel degenerative changes of the cervical spine. No acute osseous finding.  Lung apices show ground-glass opacities and impacted bronchioles,, similar to prior.   Electronically Signed   By: Carlos Levering M.D.   On: 07/24/2014 04:43   Dg Chest Portable 1 View  07/24/2014   CLINICAL DATA:  Shortness of breath.  Hypoxia.  EXAM: PORTABLE CHEST - 1 VIEW  COMPARISON:  Chest x-ray 07/24/2014,05/02/2014,08/07/2013,05/01/2012. CT chest 08/13/2013.  FINDINGS: Mediastinum hilar structures normal. Heart size normal. Pulmonary vascularity normal. Diffuse unchanged chronic reticular nodular interstitial infiltrates with bibasilar bronchiectasis again noted. No significant pleural effusion. No pneumothorax. Old right proximal humeral fracture.  IMPRESSION: Chronic bilateral reticular nodular interstitial infiltrates with bibasilar bronchiectasis and atelectasis. 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 No change from multiple prior exams.   Electronically Signed   By: Marcello Moores  Register   On: 07/24/2014 08:14    [facility], Elaiza Shoberg, MD  [facility] Pager:[phone]  If 7PM-7AM, please contact night-coverage [email or web address] Password [email or web address] 07/27/2014, 10:43 AM   LOS: 3 days

## 2014-07-27 NOTE — Plan of Care (Signed)
Problem: Phase III Progression Outcomes Goal: Activity at appropriate level-compared to baseline (UP IN CHAIR FOR HEMODIALYSIS)  Outcome: Completed/Met Date Met:  07/27/14

## 2014-07-27 NOTE — Plan of Care (Signed)
Problem: Phase III Progression Outcomes Goal: Discharge plan remains appropriate-arrangements made Outcome: Completed/Met Date Met:  07/27/14 Pt to discharge to [facility] today.      

## 2014-07-27 NOTE — Plan of Care (Signed)
Problem: Phase III Progression Outcomes Goal: Pain controlled on oral analgesia Outcome: Completed/Met Date Met:  07/27/14 Pain controlled with 5 mg oral morphine.

## 2014-07-27 NOTE — Progress Notes (Signed)
Telephone call to Helene Kelp at [facility] and [place].) Report given. Manya Silvas, RN

## 2014-07-27 NOTE — Plan of Care (Signed)
Problem: Phase III Progression Outcomes Goal: Foley discontinued Outcome: Not Applicable Date Met:  99/35/70 Foley to be left in d/t end of life care.

## 2014-07-27 NOTE — Plan of Care (Signed)
Problem: Phase III Progression Outcomes Goal: Other Phase III Outcomes/Goals Outcome: Not Applicable Date Met:  21/19/41

## 2014-07-27 NOTE — Plan of Care (Signed)
Problem: Phase III Progression Outcomes Goal: IV/normal saline lock discontinued Outcome: Not Applicable Date Met:  63/33/54 No iv access.

## 2014-07-27 NOTE — Clinical Social Work Note (Signed)
CSW continues to follow for d/c planning. CSW spoke with MD regarding d/c. CSW spoke with Lelon Frohlich [facility]) and confirmed d/c to facility. Ann provided CSW with number for report. CSW met with family at bedside (sister) who is agreeable to d/c. CSW prepared d/c packet and placed in patient's shadow chart. CSW provided RN with number for report and faxed d/c summary to facility. CSW to arrange transportation via [place]. No further needs. CSW signing off.  [place], [place] Weekend Clinical Social Worker [phone]

## 2014-07-27 NOTE — Plan of Care (Signed)
Problem: Phase III Progression Outcomes Goal: Voiding independently Outcome: Not Applicable Date Met:  99/87/21

## 2014-07-27 NOTE — Discharge Summary (Signed)
[name], 80 y.o., DOB [birth year], MRN [id number]. Admission date: 07/24/2014 Discharge Date 07/27/2014 Primary MD Christinia Gully, MD Admitting Physician Theressa Millard, MD  Admission Diagnosis  UTI (lower urinary tract infection) [N39.0] Fall [W19.XXXA] Hypoxia [R09.02] Radius distal fracture, left, closed, initial encounter [S52.502A]  Discharge Diagnosis   Principal Problem:   UTI (lower urinary tract infection) Active Problems:   Bronchiectasis without acute exacerbation   Chronic respiratory failure   Hypothyroidism   Fall   Dementia   Closed left radial fracture      Past Medical History  Diagnosis Date  . Bronchiectasis with acute exacerbation 05/16/14    acute respiratory failure   . Other and unspecified hyperlipidemia   . TIA (transient ischemic attack)   . Osteoporosis, unspecified   . Asthma   . Esophageal reflux   . IBS (irritable bowel syndrome)   . Diverticulosis   . Hemorrhoids   . Hypothyroidism   . Schatzki's ring   . Hiatal hernia   . Tubular adenoma of colon 12/2008  . Chronic respiratory failure 05/16/14  . HCAP (healthcare-associated pneumonia) 05/16/14  . Sinusitis   . GERD (gastroesophageal reflux disease)   . HTN (hypertension)   . Encephalopathy 05/16/14    noted during hospitalization  . Depressive disorder, not elsewhere classified   . Diastolic dysfunction, left ventricle 05/16/14  . Memory loss   . Pubic ramus fracture   . Hiatal hernia   . Tubular adenoma of colon   . Hypoxia   . Anemia, unspecified   . Constipation     Past Surgical History  Procedure Laterality Date  . Cholecystectomy  1995  . Nasal sinus surgery    . Tonsillectomy    . Foot surgery    . Small intestine surgery  05/2008   Admission history of present illness/brief narrative  [name] is a 80 y.o. female with history of bronchiectasis, chronic hypoxic respiratory disorder on 4 L of oxygen, TIA and advanced dementia. Patient lives in a nursing  home and she was sent to cause of her witnessed fall. Patient is demented and unable to provide history, history obtained from records. Patient have left forehead hematoma and left forearm pain. In the ED CT scan of the head/neck showed left-sided scalp hematoma without intracranial findings, left upper extremity x-ray showed closed comminuted distal radial fracture. Urinalysis consistent with UTI. Patient was started initially on IV antibiotics, but she wasrequiring 100% FiO2,had overall poor life quality, so it was discussed with sister, and hospice care were involved, patient was transitioned to comfort care, and patient is being discharged to [facility] home hospice care.  Hospital Course See H&P, Labs, Consult and Test reports for all details in brief, patient was admitted for  Principal Problem:   UTI (lower urinary tract infection) Active Problems:   Bronchiectasis without acute exacerbation   Chronic respiratory failure   Hypothyroidism   Fall   Dementia   Closed left radial fracture   UTI (lower urinary tract infection): off antibiotics as she is currently comfort care. Active Problems:  Acute on chronic hypoxic respiratory failure: Very poor prognosis, on 100% FiO2.Suspect would best benefit from residential hospice placement. Dr. Claudean Severance with sister [name]-explained poor prognosis, agreeable to transition to comfort care, social worker arranging for [employer] admission.  Bronchiectasis with acute exacerbation: Likely causing above. She has end-stage lung disease. Continue supportive care with nebs only, stopping all IV Abx, IV Solumedrol-as we are transitioning to comfort care.  Suspect would best benefit from residential hospice placement.  Hypothyroidism:stop Levothyroxine-transitioned to comfort care  Fall:fall precautions  Dementia:at baseline.  Closed left radial fracture:supporitve care. Prn Morphine.  Palliative Care: Already DO NOT  RESUSCITATE, followed by hospice at SNF. Now requiring 100% nonrebreather mask, very frail, having trouble with dysphagia now. sister is agreeable to transition to comfort care. Best benefit from residential hospice placement on discharge. Social worker following     Significant Tests:  See full reports for all details    Dg Chest 2 View  07/24/2014   CLINICAL DATA:  Fall  EXAM: CHEST  2 VIEW  COMPARISON:  05/27/2014  FINDINGS: Heart size and mediastinal contours within normal range. Diffuse reticulonodular opacities. Lower lobe bronchiectasis. Similar left lung base opacity. No overt pleural effusion or pneumothorax. Diffuse osteopenia. No definite acute osseous finding.  IMPRESSION: Similar reticulonodular interstitial prominence and lower lobe bronchiectasis.  More confluent left greater than right lung base opacities may reflect atelectasis or infiltrate.   Electronically Signed   By: Carlos Levering M.D.   On: 07/24/2014 04:08   Dg Elbow Complete Left  07/24/2014   CLINICAL DATA:  Fall  EXAM: LEFT ELBOW - COMPLETE 3+ VIEW  COMPARISON:  None.  FINDINGS: Four views of the left elbow show no displaced fracture or dislocation. No aggressive osseous lesion. No overt joint effusion.  IMPRESSION: No acute or aggressive osseous finding of the left elbow.  If clinical concern for an acute fracture persists, recommend a repeat radiograph in 7-10 days to evaluate for interval change or callus formation.   Electronically Signed   By: Carlos Levering M.D.   On: 07/24/2014 04:05   Dg Wrist Complete Left  07/24/2014   CLINICAL DATA:  Fall, left wrist bruising and deformity.  EXAM: LEFT WRIST - COMPLETE 3+ VIEW  COMPARISON:  Contemporaneous elbow radiographs  FINDINGS: There is an impacted and comminuted fracture of the distal left radius with mild dorsal displacement and angulation. There is intra-articular extension into the radiocarpal joint and into the distal radial ulnar joint as well. Slight ulnar  positive variance. Diffuse osteopenia. Atherosclerotic vascular calcifications. Soft tissue swelling.  IMPRESSION: Comminuted impaction fracture of the distal left radius with dorsal angulation and intra-articular extension.  Ulnar positive variance.   Electronically Signed   By: Carlos Levering M.D.   On: 07/24/2014 04:11   Ct Head Wo Contrast  07/24/2014   CLINICAL DATA:  Left forehead hematoma status post fall.  EXAM: CT HEAD WITHOUT CONTRAST  CT CERVICAL SPINE WITHOUT CONTRAST  TECHNIQUE: Multidetector CT imaging of the head and cervical spine was performed following the standard protocol without intravenous contrast. Multiplanar CT image reconstructions of the cervical spine were also generated.  COMPARISON:  04/29/2012  FINDINGS: CT HEAD FINDINGS  Moderate subcortical and periventricular white matter hypodensities, a nonspecific finding often seen in the setting of chronic microangiopathic change. Bilateral basal ganglia rounded hypodensities, favored to reflect remote lacunar infarctions. No definite CT evidence for acute cortical based (large artery) infarction. No intraparenchymal hemorrhage, mass, mass effect, or abnormal extra-axial fluid collection. Mild cerebral volume loss. Ventricular prominence is mildly disproportionate to volume loss elsewhere. Atherosclerotic vascular calcifications. Left frontal scalp hematoma. Evidence of chronic sinusitis with wall thickening of the maxillary sinuses. Opacified sphenoid chambers and partially opacified ethmoid air cells and frontal sinus. Right maxillary sinus mucosal thickening. Partially opacified mastoid air cells. No displaced calvarial fracture.  CT CERVICAL SPINE FINDINGS  Lung apices show ground-glass opacities and multifocal impacted bronchioles.  Atherosclerotic vascular calcifications. Maintained craniocervical relationship. Similar impaction of the dens and clivus with osseous remodeling. Multilevel degenerative changes, most pronounced at C4-5.  Multifocal lucencies are similar to prior and may be degenerative or secondary to osteopenia. Mild anterolisthesis of C6 on C7. No displaced acute fracture or dislocation.  IMPRESSION: Left frontal scalp hematoma.  No underlying calvarial fracture.  Ventriculomegaly, similar to prior. May reflect central cerebral volume loss or normal pressure hydrocephalus.  White matter changes are similar to prior, a nonspecific finding most often seen in the setting of chronic microangiopathic change.  Extensive paranasal sinus disease as above. May reflect acute on chronic sinusitis in the appropriate clinical setting.  Bilateral mastoid effusions.  Multilevel degenerative changes of the cervical spine. No acute osseous finding.  Lung apices show ground-glass opacities and impacted bronchioles,, similar to prior.   Electronically Signed   By: Carlos Levering M.D.   On: 07/24/2014 04:43   Ct Cervical Spine Wo Contrast  07/24/2014   CLINICAL DATA:  Left forehead hematoma status post fall.  EXAM: CT HEAD WITHOUT CONTRAST  CT CERVICAL SPINE WITHOUT CONTRAST  TECHNIQUE: Multidetector CT imaging of the head and cervical spine was performed following the standard protocol without intravenous contrast. Multiplanar CT image reconstructions of the cervical spine were also generated.  COMPARISON:  04/29/2012  FINDINGS: CT HEAD FINDINGS  Moderate subcortical and periventricular white matter hypodensities, a nonspecific finding often seen in the setting of chronic microangiopathic change. Bilateral basal ganglia rounded hypodensities, favored to reflect remote lacunar infarctions. No definite CT evidence for acute cortical based (large artery) infarction. No intraparenchymal hemorrhage, mass, mass effect, or abnormal extra-axial fluid collection. Mild cerebral volume loss. Ventricular prominence is mildly disproportionate to volume loss elsewhere. Atherosclerotic vascular calcifications. Left frontal scalp hematoma. Evidence of chronic  sinusitis with wall thickening of the maxillary sinuses. Opacified sphenoid chambers and partially opacified ethmoid air cells and frontal sinus. Right maxillary sinus mucosal thickening. Partially opacified mastoid air cells. No displaced calvarial fracture.  CT CERVICAL SPINE FINDINGS  Lung apices show ground-glass opacities and multifocal impacted bronchioles. Atherosclerotic vascular calcifications. Maintained craniocervical relationship. Similar impaction of the dens and clivus with osseous remodeling. Multilevel degenerative changes, most pronounced at C4-5. Multifocal lucencies are similar to prior and may be degenerative or secondary to osteopenia. Mild anterolisthesis of C6 on C7. No displaced acute fracture or dislocation.  IMPRESSION: Left frontal scalp hematoma.  No underlying calvarial fracture.  Ventriculomegaly, similar to prior. May reflect central cerebral volume loss or normal pressure hydrocephalus.  White matter changes are similar to prior, a nonspecific finding most often seen in the setting of chronic microangiopathic change.  Extensive paranasal sinus disease as above. May reflect acute on chronic sinusitis in the appropriate clinical setting.  Bilateral mastoid effusions.  Multilevel degenerative changes of the cervical spine. No acute osseous finding.  Lung apices show ground-glass opacities and impacted bronchioles,, similar to prior.   Electronically Signed   By: Carlos Levering M.D.   On: 07/24/2014 04:43   Dg Chest Portable 1 View  07/24/2014   CLINICAL DATA:  Shortness of breath.  Hypoxia.  EXAM: PORTABLE CHEST - 1 VIEW  COMPARISON:  Chest x-ray 07/24/2014,05/02/2014,08/07/2013,05/01/2012. CT chest 08/13/2013.  FINDINGS: Mediastinum hilar structures normal. Heart size normal. Pulmonary vascularity normal. Diffuse unchanged chronic reticular nodular interstitial infiltrates with bibasilar bronchiectasis again noted. No significant pleural effusion. No pneumothorax. Old right  proximal humeral fracture.  IMPRESSION: Chronic bilateral reticular nodular interstitial infiltrates with bibasilar bronchiectasis and atelectasis. Findings  are consistent with chronic infection, likely MAI. No change from multiple prior exams.   Electronically Signed   By: Marcello Moores  Register   On: 07/24/2014 08:14     Today   Subjective:   Appears comfortable.  Objective:   Blood pressure 150/76, pulse 98, temperature 98 F (36.7 C), temperature source Oral, resp. rate 20, height 5\' 2"  (1.575 m), weight 48.2 kg (106 lb 4.2 oz), SpO2 100 %.  Intake/Output Summary (Last 24 hours) at 07/27/14 1258 Last data filed at 07/27/14 0900  Gross per 24 hour  Intake    360 ml  Output    350 ml  Net     10 ml    Exam Gen Exam: Awake and alert, Neck: Supple,  Chest: Decreased air entry bilaterally, some scattered rhonchi CVS: S1 S2 Regular, no murmurs.  Abdomen: soft, BS +, non tender, non distended.  Extremities: no edema, lower extremities warm to touch. Neurologic: Non Focal.  Skin: No Rash.    Data Review     CBC w Diff:  Lab Results  Component Value Date   WBC 9.1 07/26/2014   WBC 15.8 07/22/2014   HGB 10.9* 07/26/2014   HCT 35.7* 07/26/2014   PLT 358 07/26/2014   LYMPHOPCT 13 07/24/2014   MONOPCT 11 07/24/2014   EOSPCT 1 07/24/2014   BASOPCT 0 07/24/2014   CMP:  Lab Results  Component Value Date   NA 143 07/26/2014   NA 132* 07/22/2014   K 4.5 07/26/2014   CL 101 07/26/2014   CO2 30 07/26/2014   BUN 30* 07/26/2014   BUN 18 07/22/2014   CREATININE 0.72 07/26/2014   CREATININE 0.8 07/22/2014   GLU 201 07/22/2014   PROT 7.4 05/27/2014   ALBUMIN 3.3* 05/27/2014   BILITOT 0.5 05/27/2014   ALKPHOS 257* 06/05/2014   AST 21 06/05/2014   ALT 14 06/05/2014  .  Micro Results Recent Results (from the past 240 hour(s))  Urine culture     Status: None   Collection Time: 07/24/14  3:15 AM  Result Value Ref Range Status   Specimen Description URINE,  CATHETERIZED  Final   Special Requests NONE  Final   Culture  Setup Time   Final    07/24/2014 10:40 Performed at [place]   Final    >=100,000 COLONIES/ML Performed at [facility]   Culture   Final    ESCHERICHIA COLI Performed at [facility]   Report Status 07/26/2014 FINAL  Final   Organism ID, Bacteria ESCHERICHIA COLI  Final      Susceptibility   Escherichia coli - MIC*    AMPICILLIN >=32 RESISTANT Resistant     CEFAZOLIN <=4 SENSITIVE Sensitive     CEFTRIAXONE <=1 SENSITIVE Sensitive     CIPROFLOXACIN >=4 RESISTANT Resistant     GENTAMICIN <=1 SENSITIVE Sensitive     LEVOFLOXACIN >=8 RESISTANT Resistant     NITROFURANTOIN <=16 SENSITIVE Sensitive     TOBRAMYCIN <=1 SENSITIVE Sensitive     TRIMETH/SULFA <=20 SENSITIVE Sensitive     PIP/TAZO <=4 SENSITIVE Sensitive     * ESCHERICHIA COLI  MRSA PCR Screening     Status: None   Collection Time: 07/24/14  9:19 AM  Result Value Ref Range Status   MRSA by PCR NEGATIVE NEGATIVE Final    Comment:        The GeneXpert MRSA Assay (FDA approved for NASAL specimens only), is one component of a comprehensive MRSA  colonization surveillance program. It is not intended to diagnose MRSA infection nor to guide or monitor treatment for MRSA infections.  Urine culture     Status: None   Collection Time: 07/24/14  3:06 PM  Result Value Ref Range Status   Specimen Description URINE, CATHETERIZED  Final   Special Requests NONE  Final   Culture  Setup Time   Final    07/24/2014 23:07 Performed at [place] Performed at [facility]  Final   Culture NO GROWTH Performed at [facility]  Final   Report Status 07/26/2014 FINAL  Final           Discharge Medications     Medication List    STOP taking these medications        aspirin EC 81 MG tablet     bisoprolol 5 MG tablet  Commonly known as:  ZEBETA     calcium-vitamin  D 500-200 MG-UNIT per tablet  Commonly known as:  OSCAL WITH D     cetirizine 10 MG tablet  Commonly known as:  ZYRTEC     clopidogrel 75 MG tablet  Commonly known as:  PLAVIX     fluticasone 50 MCG/ACT nasal spray  Commonly known as:  FLONASE     levothyroxine 150 MCG tablet  Commonly known as:  SYNTHROID, LEVOTHROID     moxifloxacin 400 MG tablet  Commonly known as:  AVELOX     nisoldipine 8.5 MG 24 hr tablet  Commonly known as:  SULAR     PROBIOTIC DAILY Caps     traMADol 50 MG tablet  Commonly known as:  ULTRAM      TAKE these medications        albuterol (2.5 MG/3ML) 0.083% nebulizer solution  Commonly known as:  PROVENTIL  Take 2.5 mg by nebulization every 3 (three) hours as needed for wheezing or shortness of breath.     ALPRAZolam 0.25 MG tablet  Commonly known as:  XANAX  Take 1 tablet (0.25 mg total) by mouth every 6 (six) hours as needed for anxiety.     arformoterol 15 MCG/2ML Nebu  Commonly known as:  BROVANA  Take 15 mcg by nebulization every 12 (twelve) hours.     budesonide 0.5 MG/2ML nebulizer solution  Commonly known as:  PULMICORT  Take 0.5 mg by nebulization 2 (two) times daily.     famotidine 20 MG tablet  Commonly known as:  PEPCID  Take 20 mg by mouth at bedtime.     feeding supplement (RESOURCE BREEZE) Liqd  Take 120 mLs by mouth 3 (three) times daily between meals.     guaiFENesin 600 MG 12 hr tablet  Commonly known as:  MUCINEX  Take 600 mg by mouth 2 (two) times daily.     ipratropium-albuterol 0.5-2.5 (3) MG/3ML Soln  Commonly known as:  DUONEB  Take 3 mLs by nebulization every 6 (six) hours.     latanoprost 0.005 % ophthalmic solution  Commonly known as:  XALATAN  Place 1 drop into both eyes at bedtime.     morphine CONCENTRATE 10 mg / 0.5 ml concentrated solution  Place 0.25 mLs (5 mg total) under the tongue every 3 (three) hours as needed for severe pain (severe SOB, Dyspnea).     polyethylene glycol packet  Commonly  known as:  MIRALAX / GLYCOLAX  Take 17 g by mouth 2 (two) times daily.     predniSONE 10 MG  tablet  Commonly known as:  DELTASONE  Take 10 mg by mouth daily with breakfast.     saccharomyces boulardii 250 MG capsule  Commonly known as:  FLORASTOR  Take 250 mg by mouth 2 (two) times daily.     senna-docusate 8.6-50 MG per tablet  Commonly known as:  Senokot-S  Take 2 tablets by mouth 2 (two) times daily.         Total Time in preparing paper work, data evaluation and todays exam - 35 minutes  Shontelle Muska M.D on 07/27/2014 at 12:58 PM  [place]  [phone]

## 2014-07-27 NOTE — Discharge Instructions (Signed)
Patient is being discharged to [facility] hospice home. Management as per hospice home physician.

## 2014-07-27 NOTE — Plan of Care (Signed)
Problem: Phase III Progression Outcomes Goal: Activity at appropriate level-compared to baseline (UP IN CHAIR FOR HEMODIALYSIS)  Outcome: Adequate for Discharge

## 2014-08-26 DEATH — deceased

## 2016-01-24 IMAGING — CR DG CHEST 1V
2 series · 2 of 2 positions shown · non-contrast
Comparison: CT of the chest August 13, 2013 and chest radiograph
August 07, 2013

CLINICAL DATA: Fall, shortness of breath.

EXAM:
CHEST - 1 VIEW

[t chest supine (1 of 2)]
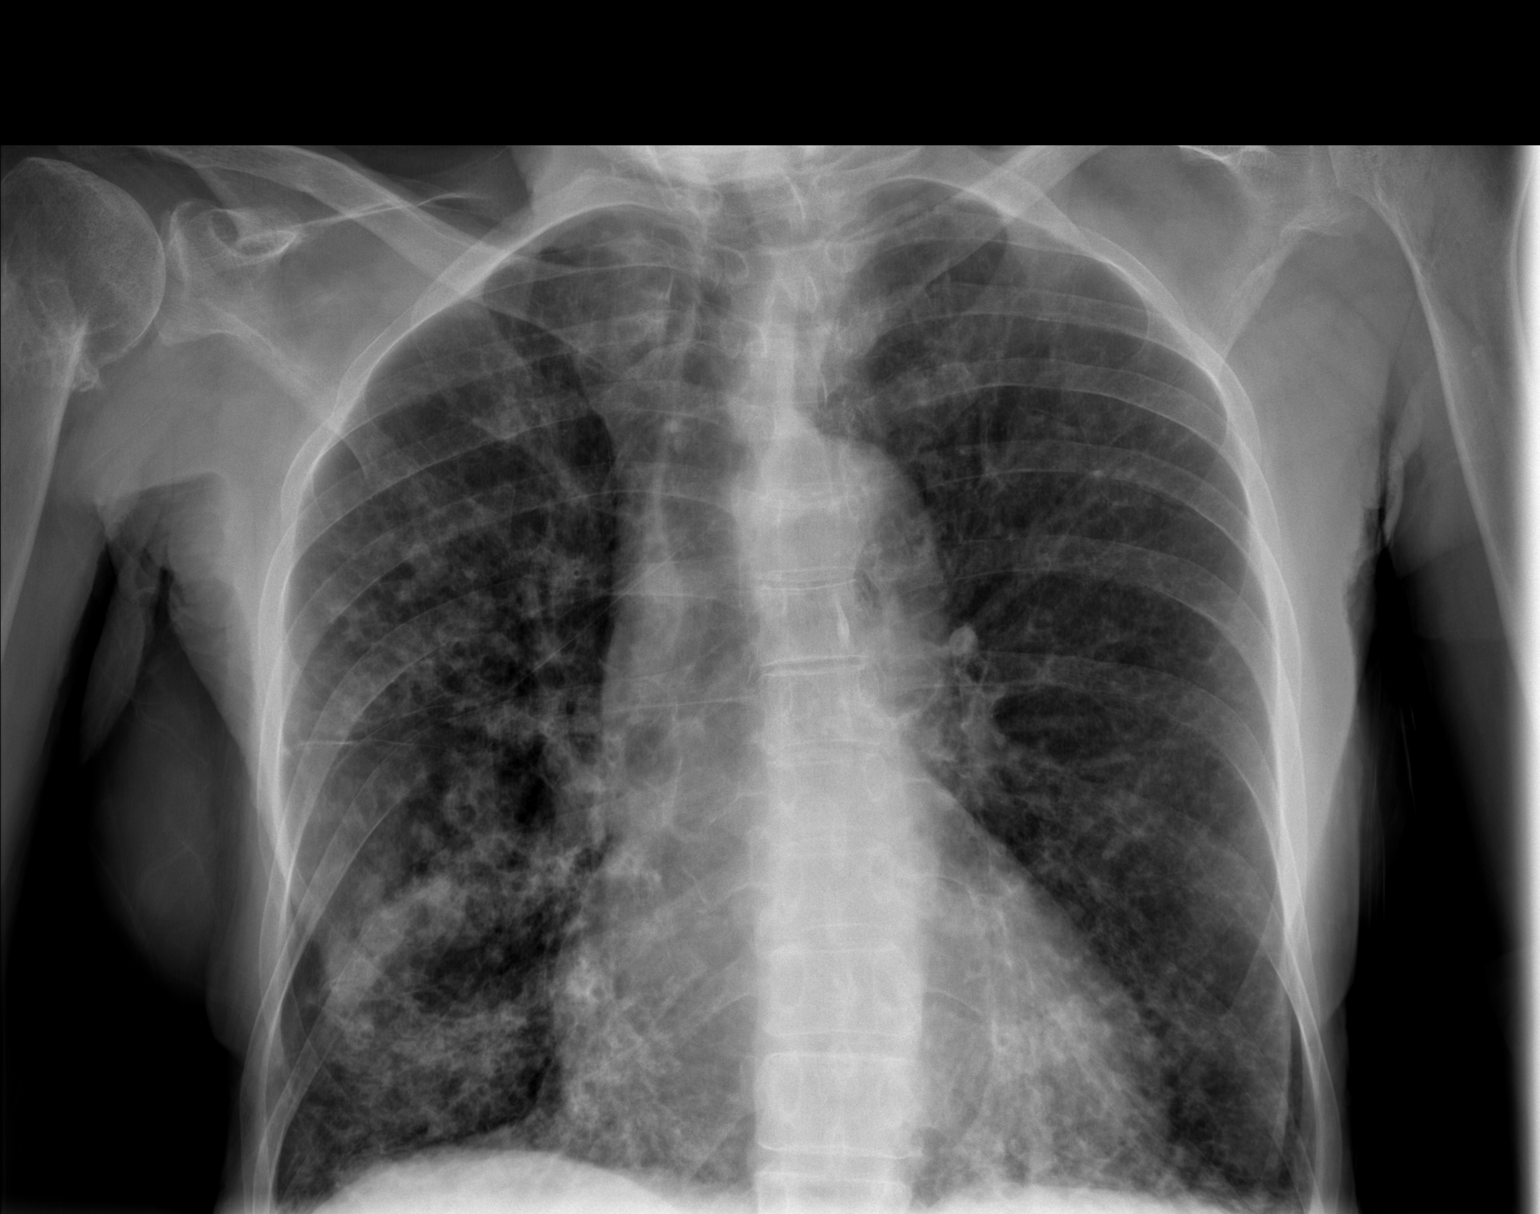

[t chest supine (2 of 2)]
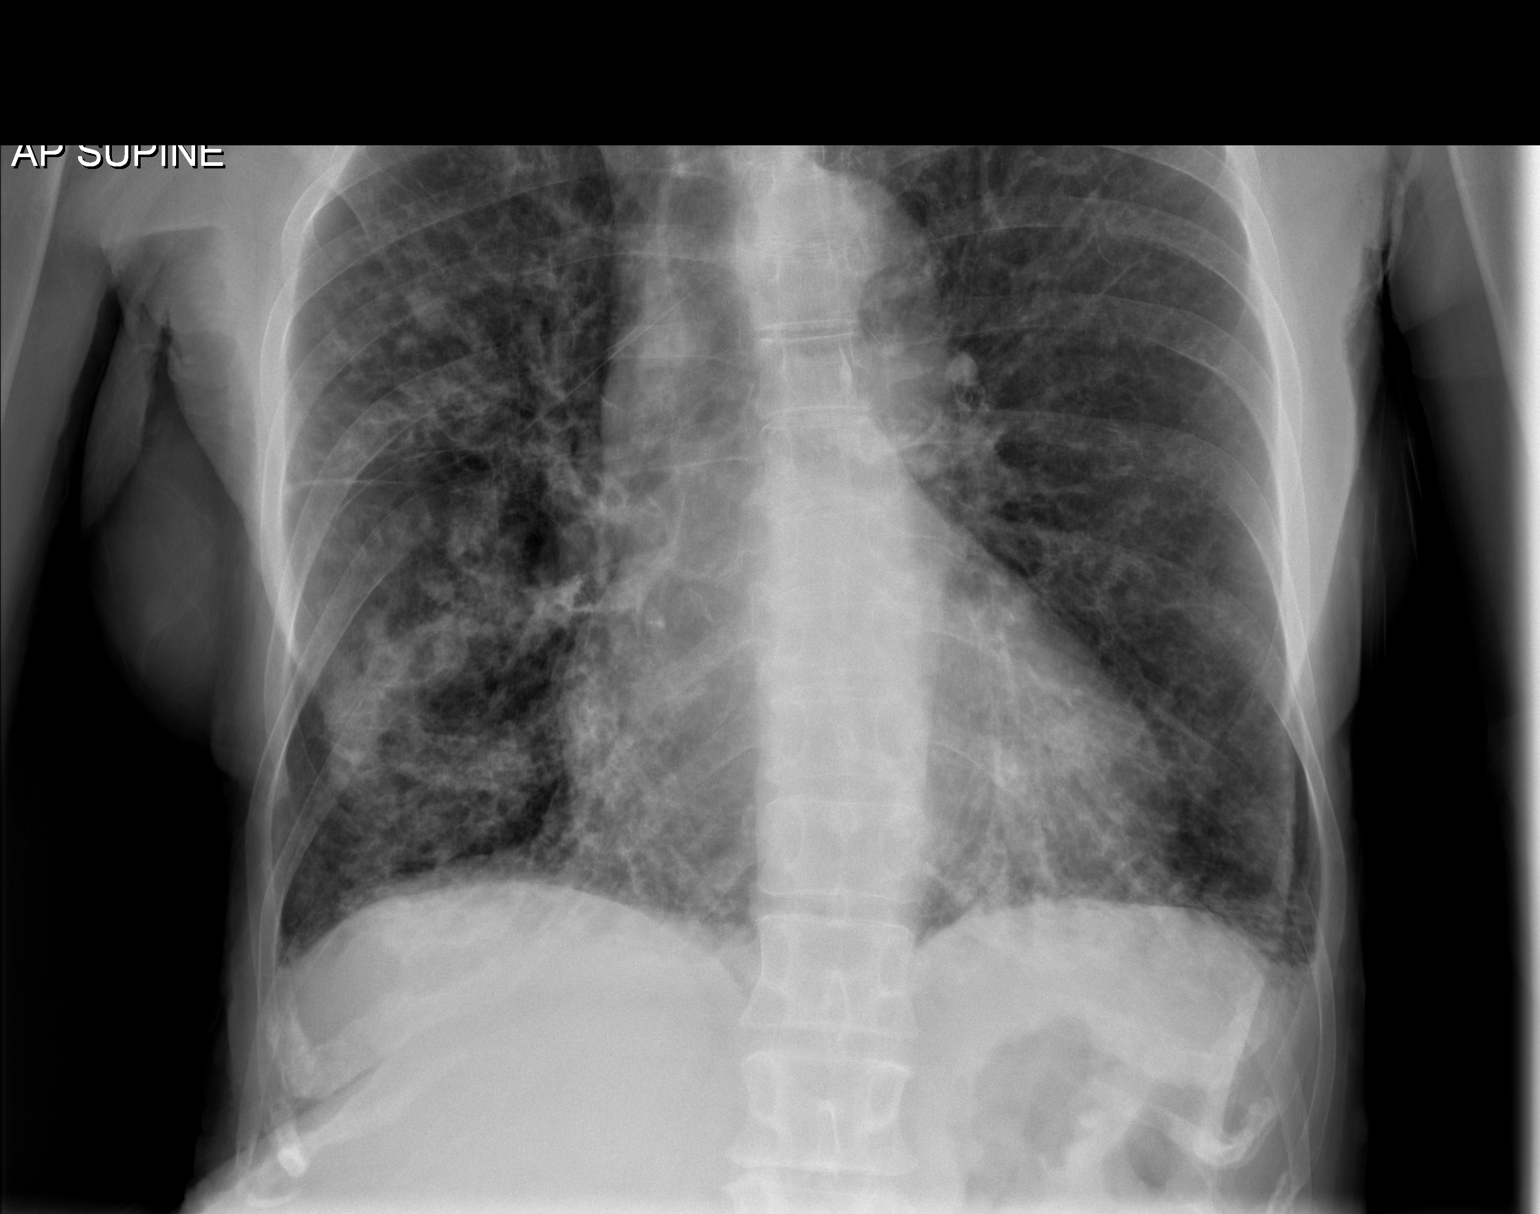

[2 of 2 positions shown; findings below may reference images not displayed]

FINDINGS: Increased lung volumes, diffuse reticular nodular densities,
slightly decreased on the right corresponding to known
bronchiectasis. No pleural effusions or focal consolidations. No
pneumothorax

The cardiac silhouette appears mildly enlarged, mediastinal
silhouette is nonsuspicious.

Chronic right humeral surgical neck fracture.
IMPRESSION: Mild cardiomegaly.

Diffuse chronic change of the lungs corresponding to known
bronchiectasis, and mucoid impaction, slightly improved on the
right.

  By: Clephat Paraison

## 2016-01-24 IMAGING — CR DG CHEST 1V PORT
1 series · 1 of 1 positions shown · non-contrast
Comparison: Portable exam 8814 hr compared earlier study of [DATE]
2645 hr and correlated with prior CT chest 08/13/2013

CLINICAL DATA: Respiratory distress, history bronchiectasis,

EXAM:
PORTABLE CHEST - 1 VIEW

[AP]
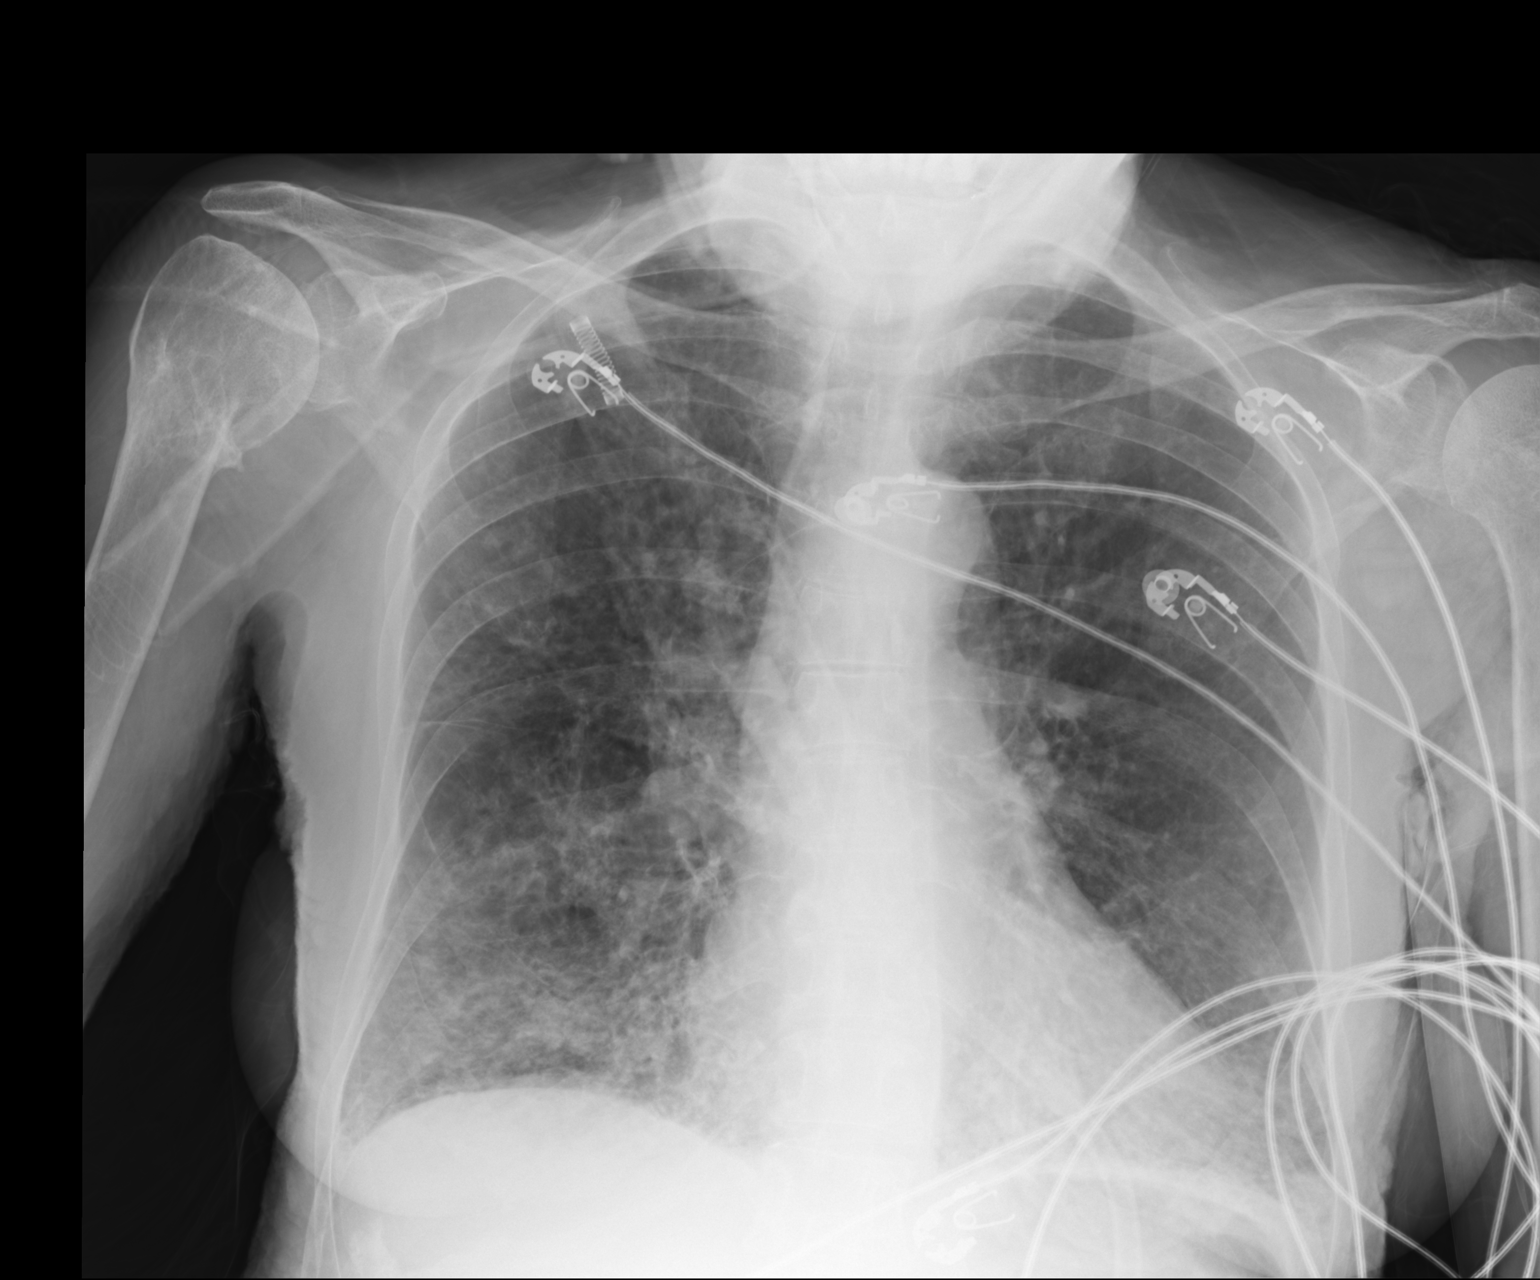

[1 of 1 positions shown; findings below may reference images not displayed]

FINDINGS: Upper normal heart size.

Normal mediastinal contours.

Diffuse chronic reticulonodular infiltrates throughout both lungs
greatest at bases.

Scattered peribronchial thickening with known bronchiectasis in
lower lobes particularly RIGHT.

Bibasilar atelectasis versus infiltrate greater on RIGHT.

No pleural effusion or pneumothorax.

Bones demineralized with posttraumatic deformity of proximal RIGHT
humerus.
IMPRESSION: Chronic peribronchial thickening and interstitial lung disease
changes compatible with chronic bronchitis and known bronchiectasis.

Superimposed bibasilar atelectasis versus infiltrate greater on
RIGHT.
